# Patient Record
Sex: Female | Born: 1950 | Hispanic: No | Marital: Married | Smoking: Never smoker
Health system: Southern US, Community
[De-identification: ages and names within clinical notes are randomized; demographics above are authoritative.]

## PROBLEM LIST (undated history)

## (undated) DIAGNOSIS — R490 Dysphonia: Secondary | ICD-10-CM

## (undated) DIAGNOSIS — M1990 Unspecified osteoarthritis, unspecified site: Secondary | ICD-10-CM

## (undated) DIAGNOSIS — K219 Gastro-esophageal reflux disease without esophagitis: Secondary | ICD-10-CM

## (undated) DIAGNOSIS — J3089 Other allergic rhinitis: Secondary | ICD-10-CM

## (undated) DIAGNOSIS — J45909 Unspecified asthma, uncomplicated: Secondary | ICD-10-CM

## (undated) DIAGNOSIS — I447 Left bundle-branch block, unspecified: Secondary | ICD-10-CM

## (undated) DIAGNOSIS — Z8719 Personal history of other diseases of the digestive system: Secondary | ICD-10-CM

## (undated) DIAGNOSIS — F419 Anxiety disorder, unspecified: Secondary | ICD-10-CM

## (undated) DIAGNOSIS — Z9889 Other specified postprocedural states: Secondary | ICD-10-CM

## (undated) DIAGNOSIS — K3184 Gastroparesis: Secondary | ICD-10-CM

## (undated) DIAGNOSIS — Z87898 Personal history of other specified conditions: Secondary | ICD-10-CM

## (undated) DIAGNOSIS — K311 Adult hypertrophic pyloric stenosis: Secondary | ICD-10-CM

## (undated) DIAGNOSIS — I10 Essential (primary) hypertension: Secondary | ICD-10-CM

## (undated) DIAGNOSIS — R112 Nausea with vomiting, unspecified: Secondary | ICD-10-CM

## (undated) DIAGNOSIS — I209 Angina pectoris, unspecified: Secondary | ICD-10-CM

## (undated) HISTORY — DX: Adult hypertrophic pyloric stenosis: K31.1

## (undated) HISTORY — DX: Other allergic rhinitis: J30.89

## (undated) HISTORY — DX: Anxiety disorder, unspecified: F41.9

## (undated) HISTORY — DX: Dysphonia: R49.0

## (undated) HISTORY — PX: COLONOSCOPY: SHX174

## (undated) HISTORY — PX: CERVICAL DISC ARTHROPLASTY: SHX587

## (undated) HISTORY — DX: Left bundle-branch block, unspecified: I44.7

## (undated) HISTORY — DX: Unspecified asthma, uncomplicated: J45.909

## (undated) HISTORY — PX: ESOPHAGOGASTRODUODENOSCOPY: SHX1529

---

## 1981-04-17 HISTORY — PX: ABDOMINAL HYSTERECTOMY: SHX81

## 1981-04-17 HISTORY — PX: OTHER SURGICAL HISTORY: SHX169

## 2004-04-17 HISTORY — PX: OTHER SURGICAL HISTORY: SHX169

## 2009-04-17 DIAGNOSIS — I209 Angina pectoris, unspecified: Secondary | ICD-10-CM

## 2009-04-17 HISTORY — DX: Angina pectoris, unspecified: I20.9

## 2009-10-15 HISTORY — PX: CARDIAC CATHETERIZATION: SHX172

## 2009-11-08 ENCOUNTER — Inpatient Hospital Stay (HOSPITAL_COMMUNITY): Admission: EM | Admit: 2009-11-08 | Discharge: 2009-11-10 | Payer: Self-pay | Admitting: Emergency Medicine

## 2009-11-08 IMAGING — CR DG CHEST 2V
2 series · 2 of 2 positions shown · non-contrast
Comparison: None.

CLINICAL DATA: Chest pain and dyspnea.

CHEST - 2 VIEW

[w chest pa]
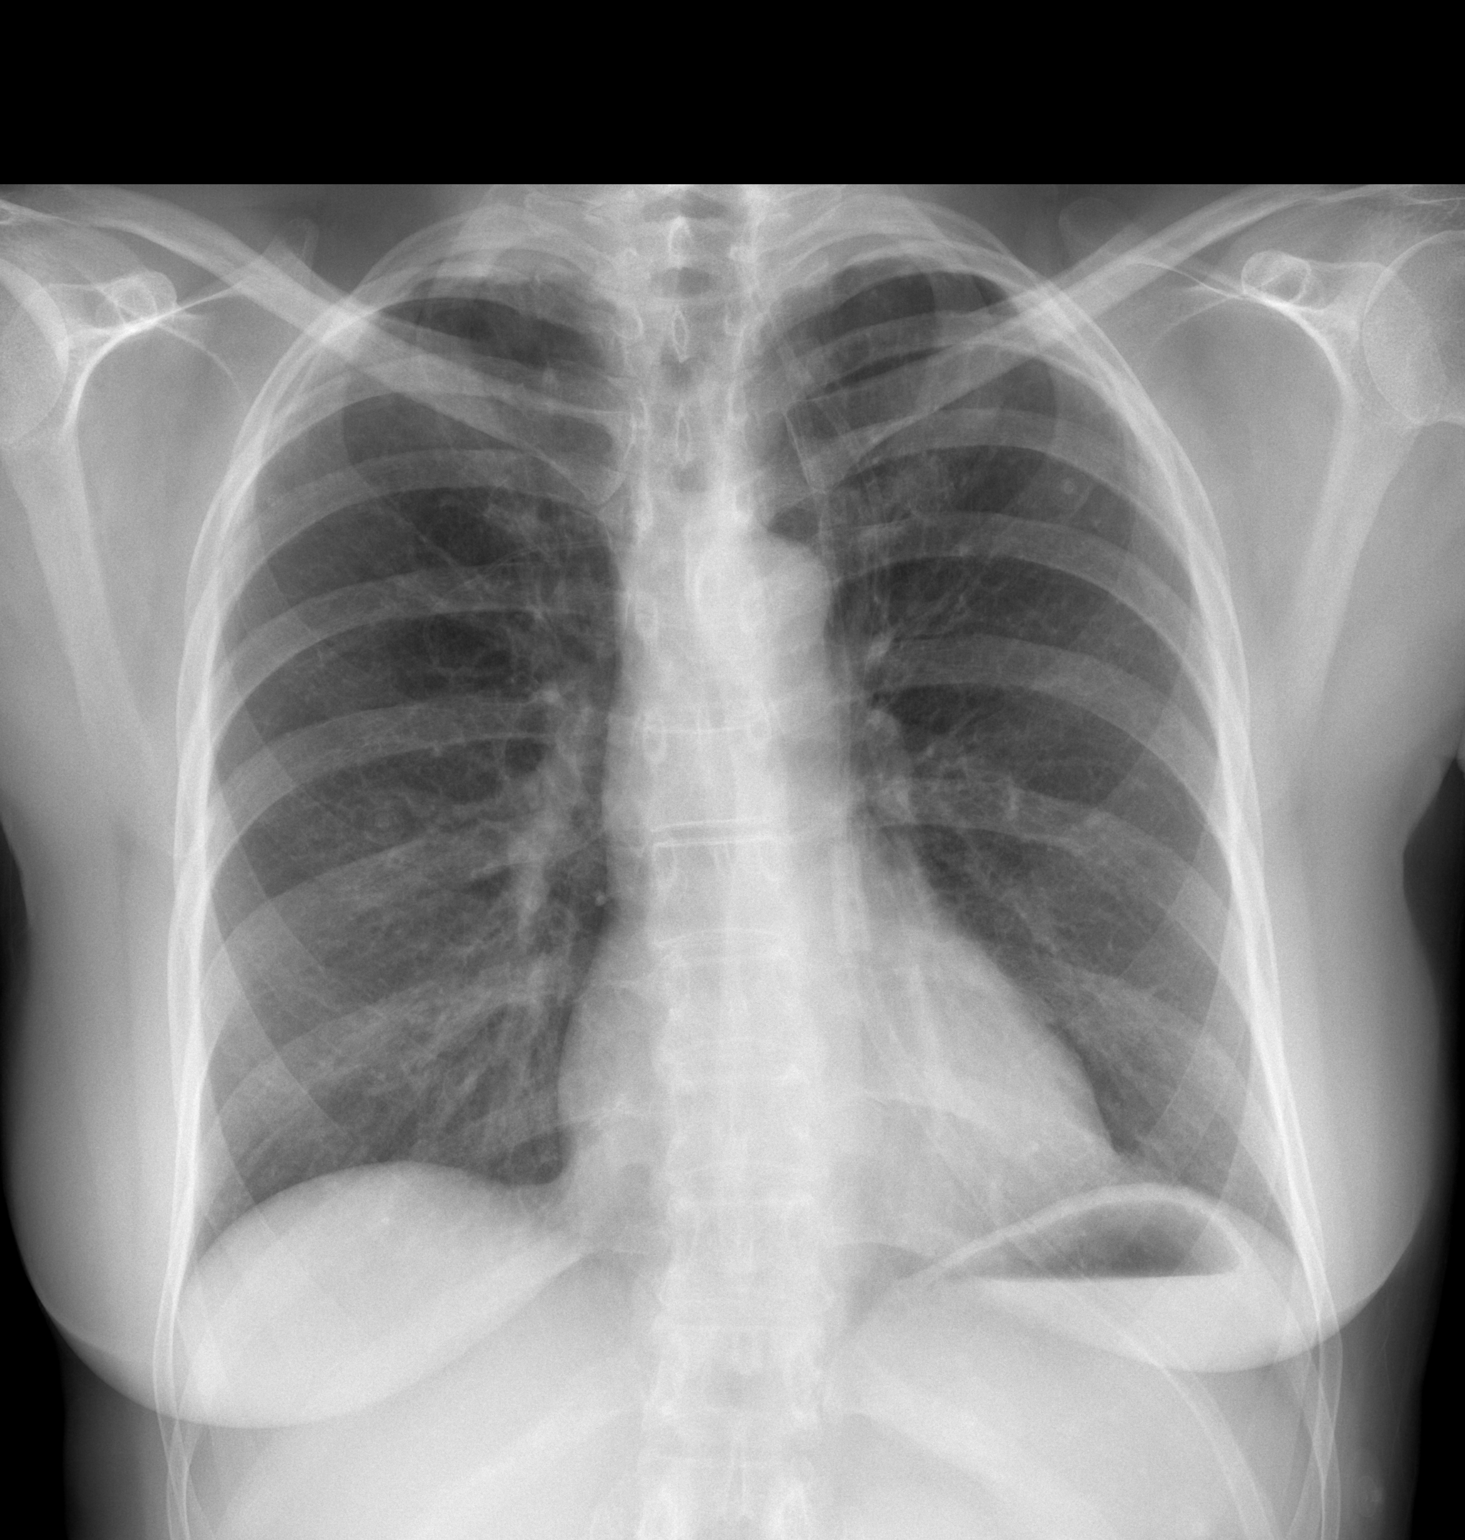

[w chest lat]
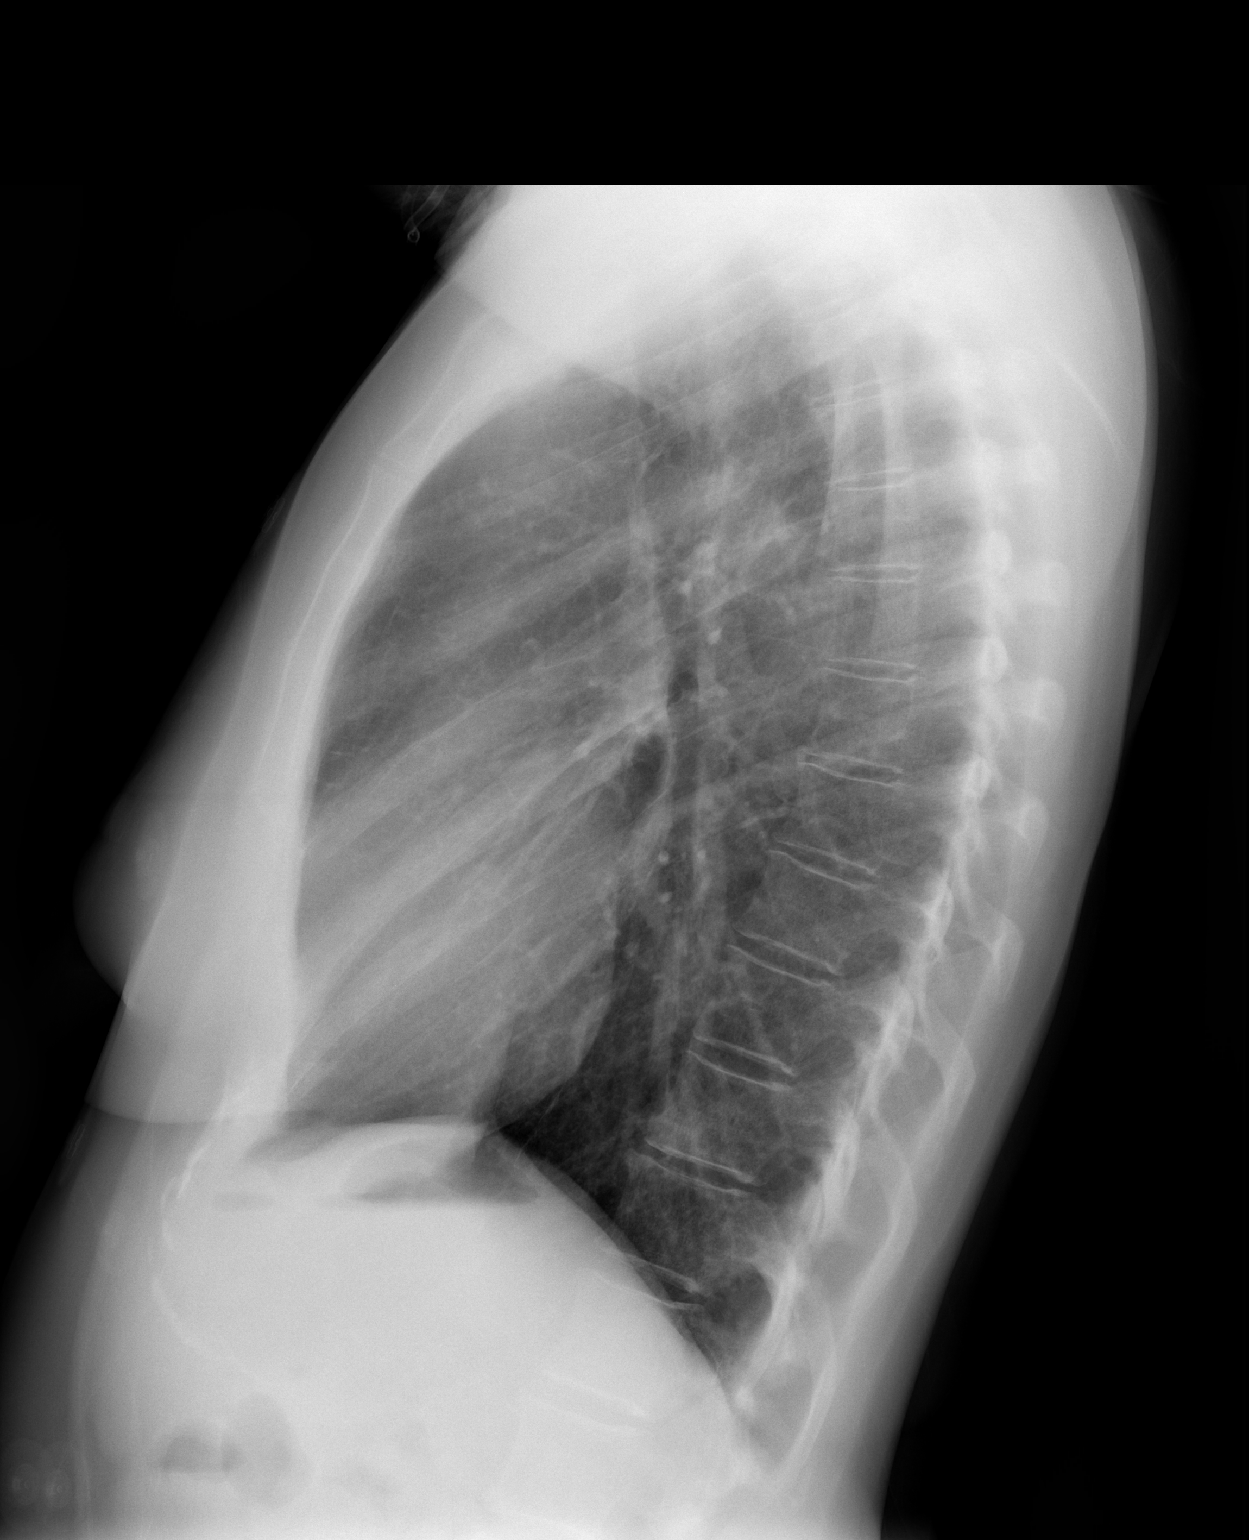

[2 of 2 positions shown; findings below may reference images not displayed]

FINDINGS: The lungs are clear without focal infiltrate, edema,
pneumothorax or pleural effusion. The cardiopericardial silhouette
is within normal limits for size. Imaged bony structures of the
thorax are intact. Telemetry pads project over each upper lobe.
IMPRESSION: No acute cardiopulmonary process.

## 2009-11-09 ENCOUNTER — Encounter (INDEPENDENT_AMBULATORY_CARE_PROVIDER_SITE_OTHER): Payer: Self-pay | Admitting: Interventional Cardiology

## 2010-07-02 LAB — BASIC METABOLIC PANEL
BUN: 5 mg/dL — ABNORMAL LOW (ref 6–23)
CO2: 28 mEq/L (ref 19–32)
Calcium: 8.7 mg/dL (ref 8.4–10.5)
Chloride: 105 mEq/L (ref 96–112)
Creatinine, Ser: 0.8 mg/dL (ref 0.4–1.2)
GFR calc Af Amer: >60 mL/min (ref >60)
GFR calc non Af Amer: >60 mL/min (ref >60)
Glucose, Bld: 99 mg/dL (ref 70–99)
Potassium: 5.1 mEq/L (ref 3.5–5.1)
Sodium: 137 mEq/L (ref 135–145)

## 2010-07-02 LAB — CK TOTAL AND CKMB (NOT AT ARMC)
CK, MB: 11 ng/mL (ref 0.3–4.0)
CK, MB: 37.9 ng/mL (ref 0.3–4.0)
CK, MB: 46.6 ng/mL (ref 0.3–4.0)
Relative Index: 3.6 — ABNORMAL HIGH (ref 0.0–2.5)
Relative Index: 3.8 — ABNORMAL HIGH (ref 0.0–2.5)
Relative Index: 4.2 — ABNORMAL HIGH (ref 0.0–2.5)
Total CK: 1109 U/L — ABNORMAL HIGH (ref 7–177)
Total CK: 305 U/L — ABNORMAL HIGH (ref 7–177)
Total CK: 995 U/L — ABNORMAL HIGH (ref 7–177)

## 2010-07-02 LAB — POCT I-STAT, CHEM 8
BUN: 6 mg/dL (ref 6–23)
Calcium, Ion: 1.06 mmol/L — ABNORMAL LOW (ref 1.12–1.32)
Chloride: 97 mEq/L (ref 96–112)
Creatinine, Ser: 0.8 mg/dL (ref 0.4–1.2)
Glucose, Bld: 102 mg/dL — ABNORMAL HIGH (ref 70–99)
HCT: 45 % (ref 36.0–46.0)
Hemoglobin: 15.3 g/dL — ABNORMAL HIGH (ref 12.0–15.0)
Potassium: 3.1 mEq/L — ABNORMAL LOW (ref 3.5–5.1)
Sodium: 131 mEq/L — ABNORMAL LOW (ref 135–145)
TCO2: 19 mmol/L (ref 0–100)

## 2010-07-02 LAB — CBC
HCT: 36.3 % (ref 36.0–46.0)
HCT: 42.5 % (ref 36.0–46.0)
Hemoglobin: 12.5 g/dL (ref 12.0–15.0)
Hemoglobin: 14.5 g/dL (ref 12.0–15.0)
MCH: 30.9 pg (ref 26.0–34.0)
MCH: 31.5 pg (ref 26.0–34.0)
MCHC: 33.3 g/dL (ref 30.0–36.0)
MCHC: 34.1 g/dL (ref 30.0–36.0)
MCV: 92.3 fL (ref 78.0–100.0)
MCV: 92.6 fL (ref 78.0–100.0)
Platelets: 227 10*3/uL (ref 150–400)
Platelets: 307 10*3/uL (ref 150–400)
RBC: 3.92 MIL/uL (ref 3.87–5.11)
RBC: 4.61 MIL/uL (ref 3.87–5.11)
RDW: 13.1 % (ref 11.5–15.5)
RDW: 13.5 % (ref 11.5–15.5)
WBC: 5.4 10*3/uL (ref 4.0–10.5)
WBC: 8.7 10*3/uL (ref 4.0–10.5)

## 2010-07-02 LAB — POCT CARDIAC MARKERS
CKMB, poc: 1.7 ng/mL (ref 1.0–8.0)
CKMB, poc: 6.8 ng/mL (ref 1.0–8.0)
Myoglobin, poc: 183 ng/mL (ref 12–200)
Myoglobin, poc: >500 ng/mL (ref 12–200)
Troponin i, poc: <0.05 ng/mL (ref 0.00–0.09)
Troponin i, poc: <0.05 ng/mL (ref 0.00–0.09)

## 2010-07-02 LAB — URINALYSIS, ROUTINE W REFLEX MICROSCOPIC
Bilirubin Urine: NEGATIVE
Glucose, UA: NEGATIVE mg/dL
Hgb urine dipstick: NEGATIVE
Ketones, ur: 15 mg/dL — AB
Nitrite: NEGATIVE
Protein, ur: NEGATIVE mg/dL
Specific Gravity, Urine: 1.011 (ref 1.005–1.030)
Urobilinogen, UA: 0.2 mg/dL (ref 0.0–1.0)
pH: 8.5 — ABNORMAL HIGH (ref 5.0–8.0)

## 2010-07-02 LAB — DIFFERENTIAL
Basophils Absolute: 0.1 10*3/uL (ref 0.0–0.1)
Basophils Relative: 1 % (ref 0–1)
Eosinophils Absolute: 0.1 10*3/uL (ref 0.0–0.7)
Eosinophils Relative: 1 % (ref 0–5)
Lymphocytes Relative: 31 % (ref 12–46)
Lymphs Abs: 2.7 10*3/uL (ref 0.7–4.0)
Monocytes Absolute: 0.5 10*3/uL (ref 0.1–1.0)
Monocytes Relative: 6 % (ref 3–12)
Neutro Abs: 5.3 10*3/uL (ref 1.7–7.7)
Neutrophils Relative %: 61 % (ref 43–77)

## 2010-07-02 LAB — CARDIAC PANEL(CRET KIN+CKTOT+MB+TROPI)
CK, MB: 15 ng/mL (ref 0.3–4.0)
CK, MB: 18.6 ng/mL (ref 0.3–4.0)
Relative Index: 2.5 (ref 0.0–2.5)
Relative Index: 2.7 — ABNORMAL HIGH (ref 0.0–2.5)
Total CK: 595 U/L — ABNORMAL HIGH (ref 7–177)
Total CK: 685 U/L — ABNORMAL HIGH (ref 7–177)
Troponin I: 0.01 ng/mL (ref 0.00–0.06)
Troponin I: 0.01 ng/mL (ref 0.00–0.06)

## 2010-07-02 LAB — URINE CULTURE: Colony Count: 80000

## 2010-07-02 LAB — TROPONIN I
Troponin I: 0.01 ng/mL (ref 0.00–0.06)
Troponin I: 0.02 ng/mL (ref 0.00–0.06)
Troponin I: 0.04 ng/mL (ref 0.00–0.06)

## 2010-07-02 LAB — D-DIMER, QUANTITATIVE: D-Dimer, Quant: 0.29 ug/mL-FEU (ref 0.00–0.48)

## 2010-07-02 LAB — PROTIME-INR
INR: 0.94 (ref 0.00–1.49)
Prothrombin Time: 12.5 seconds (ref 11.6–15.2)

## 2010-07-02 LAB — LIPID PANEL
Cholesterol: 216 mg/dL — ABNORMAL HIGH (ref 0–200)
HDL: 69 mg/dL (ref >39)
LDL Cholesterol: 133 mg/dL — ABNORMAL HIGH (ref 0–99)
Total CHOL/HDL Ratio: 3.1 RATIO
Triglycerides: 71 mg/dL (ref <150)
VLDL: 14 mg/dL (ref 0–40)

## 2010-07-02 LAB — HEMOGLOBIN A1C
Hgb A1c MFr Bld: 5.9 % — ABNORMAL HIGH (ref <5.7)
Mean Plasma Glucose: 123 mg/dL — ABNORMAL HIGH (ref <117)

## 2010-07-02 LAB — APTT: aPTT: 27 seconds (ref 24–37)

## 2011-12-12 ENCOUNTER — Other Ambulatory Visit (HOSPITAL_COMMUNITY): Payer: Self-pay | Admitting: Gastroenterology

## 2011-12-12 DIAGNOSIS — K3184 Gastroparesis: Secondary | ICD-10-CM

## 2011-12-21 ENCOUNTER — Encounter (HOSPITAL_COMMUNITY)
Admission: RE | Admit: 2011-12-21 | Discharge: 2011-12-21 | Disposition: A | Discharge status: Home / Self Care | Payer: Managed Care, Other (non HMO) | Source: Ambulatory Visit | Attending: Gastroenterology | Admitting: Gastroenterology

## 2011-12-21 DIAGNOSIS — R1013 Epigastric pain: Secondary | ICD-10-CM | POA: Insufficient documentation

## 2011-12-21 DIAGNOSIS — K3184 Gastroparesis: Secondary | ICD-10-CM

## 2011-12-21 DIAGNOSIS — K3189 Other diseases of stomach and duodenum: Secondary | ICD-10-CM | POA: Insufficient documentation

## 2011-12-21 MED ORDER — TECHNETIUM TC 99M SULFUR COLLOID
2.2000 | Freq: Once | INTRAVENOUS | Status: AC | PRN
  Administered 2011-12-21: 2.2 via ORAL

## 2013-04-14 ENCOUNTER — Emergency Department (HOSPITAL_COMMUNITY): Payer: Managed Care, Other (non HMO)

## 2013-04-14 ENCOUNTER — Emergency Department (HOSPITAL_COMMUNITY)
Admission: EM | Admit: 2013-04-14 | Discharge: 2013-04-14 | Disposition: A | Discharge status: Home / Self Care | Payer: Managed Care, Other (non HMO) | Attending: Emergency Medicine | Admitting: Emergency Medicine

## 2013-04-14 ENCOUNTER — Encounter (HOSPITAL_COMMUNITY): Payer: Self-pay | Admitting: Emergency Medicine

## 2013-04-14 DIAGNOSIS — R220 Localized swelling, mass and lump, head: Secondary | ICD-10-CM | POA: Insufficient documentation

## 2013-04-14 DIAGNOSIS — R209 Unspecified disturbances of skin sensation: Secondary | ICD-10-CM | POA: Insufficient documentation

## 2013-04-14 DIAGNOSIS — R55 Syncope and collapse: Secondary | ICD-10-CM | POA: Insufficient documentation

## 2013-04-14 DIAGNOSIS — R42 Dizziness and giddiness: Secondary | ICD-10-CM | POA: Insufficient documentation

## 2013-04-14 DIAGNOSIS — R112 Nausea with vomiting, unspecified: Secondary | ICD-10-CM | POA: Insufficient documentation

## 2013-04-14 DIAGNOSIS — I10 Essential (primary) hypertension: Secondary | ICD-10-CM | POA: Insufficient documentation

## 2013-04-14 DIAGNOSIS — R110 Nausea: Secondary | ICD-10-CM | POA: Insufficient documentation

## 2013-04-14 DIAGNOSIS — R51 Headache: Secondary | ICD-10-CM | POA: Insufficient documentation

## 2013-04-14 DIAGNOSIS — Z8719 Personal history of other diseases of the digestive system: Secondary | ICD-10-CM | POA: Insufficient documentation

## 2013-04-14 DIAGNOSIS — R5383 Other fatigue: Secondary | ICD-10-CM | POA: Insufficient documentation

## 2013-04-14 DIAGNOSIS — R5381 Other malaise: Secondary | ICD-10-CM | POA: Insufficient documentation

## 2013-04-14 HISTORY — DX: Gastroparesis: K31.84

## 2013-04-14 HISTORY — DX: Essential (primary) hypertension: I10

## 2013-04-14 LAB — URINALYSIS, ROUTINE W REFLEX MICROSCOPIC
Bilirubin Urine: NEGATIVE
Glucose, UA: NEGATIVE mg/dL
Hgb urine dipstick: NEGATIVE
Ketones, ur: NEGATIVE mg/dL
Nitrite: NEGATIVE
Protein, ur: NEGATIVE mg/dL
Specific Gravity, Urine: 1.022 (ref 1.005–1.030)
Urobilinogen, UA: 0.2 mg/dL (ref 0.0–1.0)
pH: 7.5 (ref 5.0–8.0)

## 2013-04-14 LAB — CBC WITH DIFFERENTIAL/PLATELET
Basophils Absolute: 0 10*3/uL (ref 0.0–0.1)
Basophils Relative: 0 % (ref 0–1)
Eosinophils Absolute: 0 10*3/uL (ref 0.0–0.7)
Eosinophils Relative: 0 % (ref 0–5)
HCT: 39.9 % (ref 36.0–46.0)
Hemoglobin: 13.8 g/dL (ref 12.0–15.0)
Lymphocytes Relative: 7 % — ABNORMAL LOW (ref 12–46)
Lymphs Abs: 0.7 10*3/uL (ref 0.7–4.0)
MCH: 29.7 pg (ref 26.0–34.0)
MCHC: 34.6 g/dL (ref 30.0–36.0)
MCV: 86 fL (ref 78.0–100.0)
Monocytes Absolute: 0.4 10*3/uL (ref 0.1–1.0)
Monocytes Relative: 4 % (ref 3–12)
Neutro Abs: 8.8 10*3/uL — ABNORMAL HIGH (ref 1.7–7.7)
Neutrophils Relative %: 88 % — ABNORMAL HIGH (ref 43–77)
Platelets: 248 10*3/uL (ref 150–400)
RBC: 4.64 MIL/uL (ref 3.87–5.11)
RDW: 13.7 % (ref 11.5–15.5)
WBC: 10 10*3/uL (ref 4.0–10.5)

## 2013-04-14 LAB — BASIC METABOLIC PANEL
BUN: 12 mg/dL (ref 6–23)
CO2: 27 mEq/L (ref 19–32)
Calcium: 9.2 mg/dL (ref 8.4–10.5)
Chloride: 98 mEq/L (ref 96–112)
Creatinine, Ser: 0.75 mg/dL (ref 0.50–1.10)
GFR calc Af Amer: >90 mL/min (ref >90)
GFR calc non Af Amer: 88 mL/min — ABNORMAL LOW (ref >90)
Glucose, Bld: 105 mg/dL — ABNORMAL HIGH (ref 70–99)
Potassium: 4.1 mEq/L (ref 3.5–5.1)
Sodium: 134 mEq/L — ABNORMAL LOW (ref 135–145)

## 2013-04-14 LAB — URINE MICROSCOPIC-ADD ON

## 2013-04-14 LAB — TROPONIN I: Troponin I: <0.3 ng/mL (ref <0.30)

## 2013-04-14 IMAGING — CT CT HEAD W/O CM
2 series · 16 of 30 positions shown, 18 images · non-contrast
Comparison: None

CLINICAL DATA: Syncopal episode while seated. Subsequent nausea and
vomiting. Headache. Stroke risk factors include hypertension.

EXAM:
CT HEAD WITHOUT CONTRAST
TECHNIQUE: Contiguous axial images were obtained from the base of the skull
through the vertex without contrast.

[Series 2: head w/o · axial · non-contrast · 0.49mm/px · z∈[+102,+232]mm · 8 of 34 slices shown, 10 images]
[im 4/34  brain]
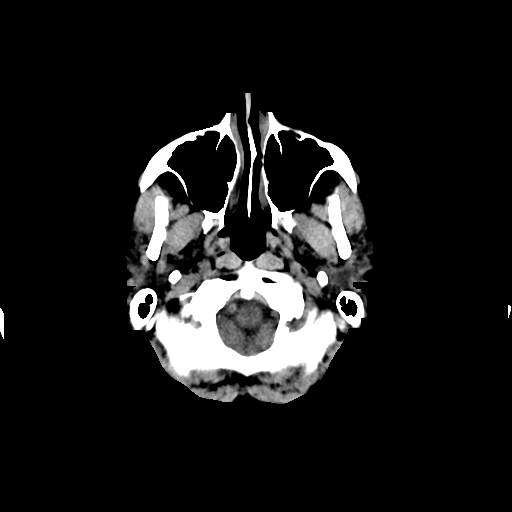
[im 4/34  bone]
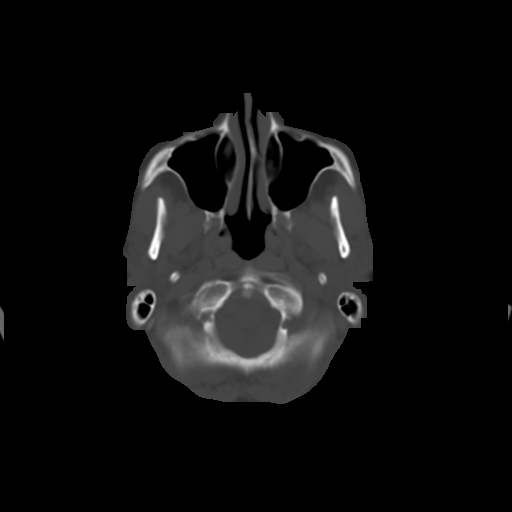
[im 8/34  brain]
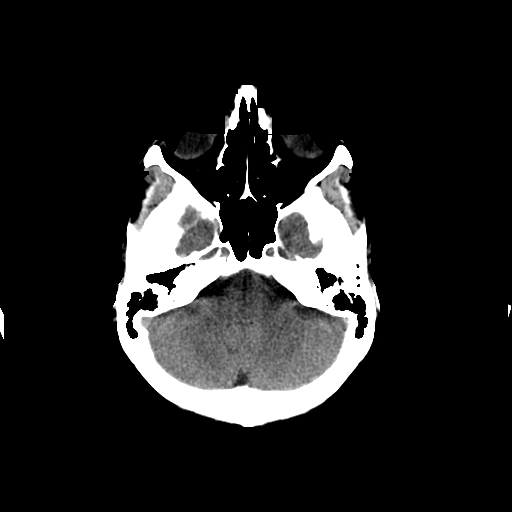
[im 12/34  brain]
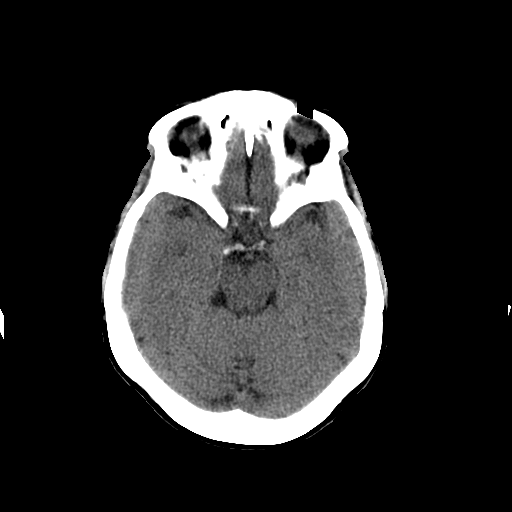
[im 15/34  brain]
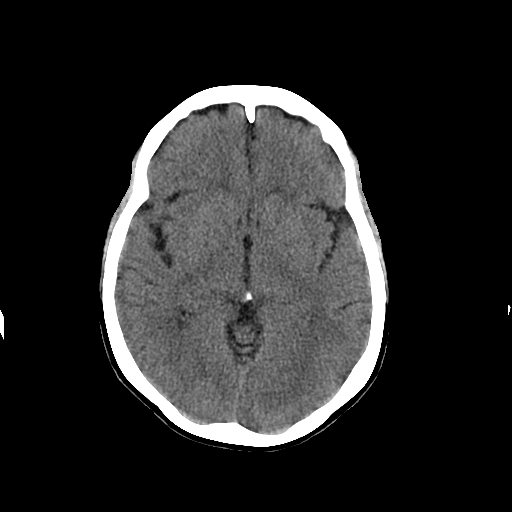
[im 19/34  brain]
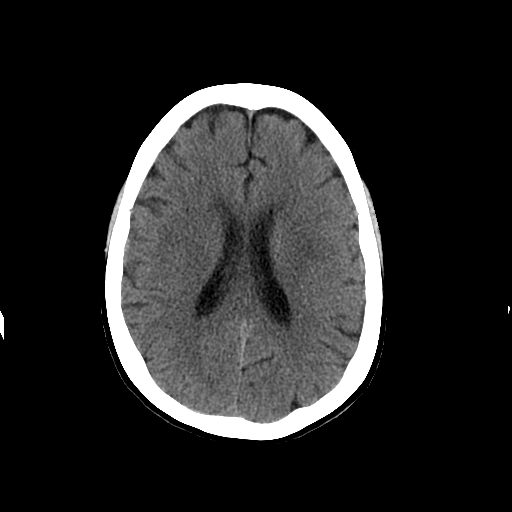
[im 19/34  bone]
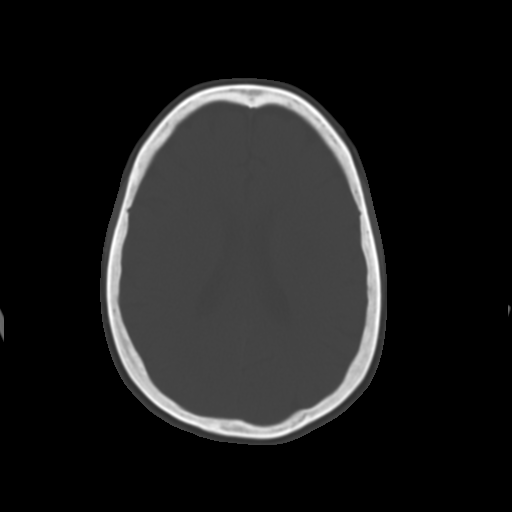
[im 23/34  brain]
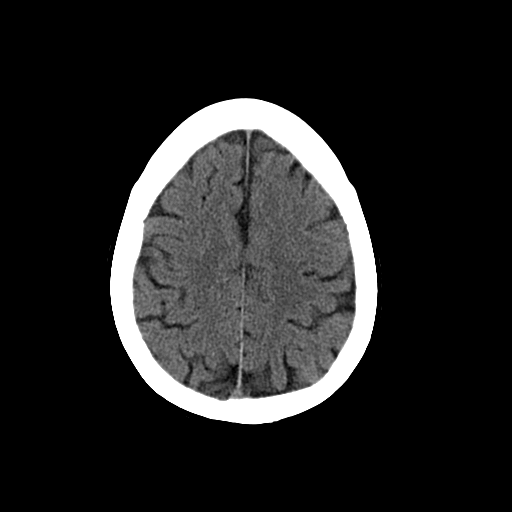
[im 26/34  brain]
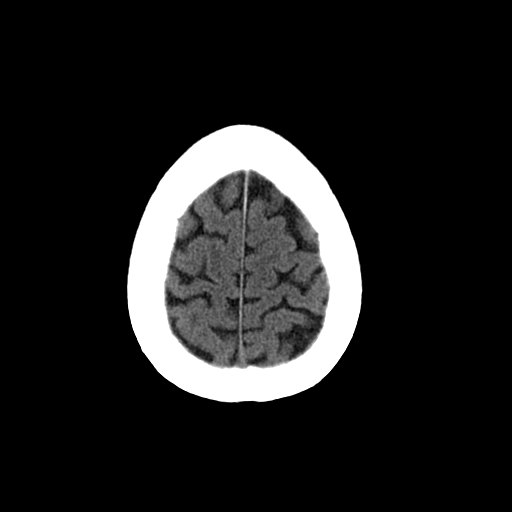
[im 30/34  brain]
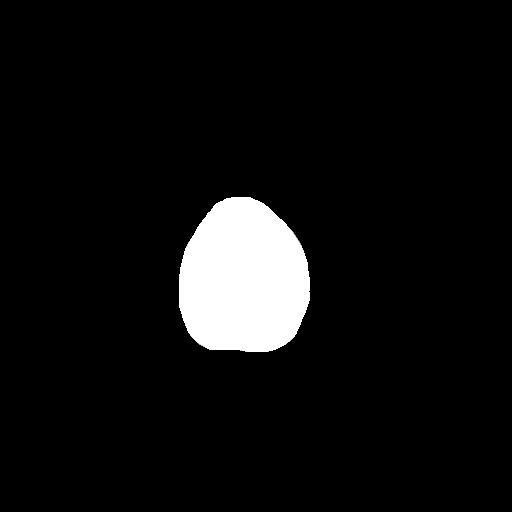

[Series 3: head w/o bone · axial · non-contrast · 0.49mm/px · z∈[+102,+234]mm · 8 of 67 slices shown]
[im 7/67  bone]
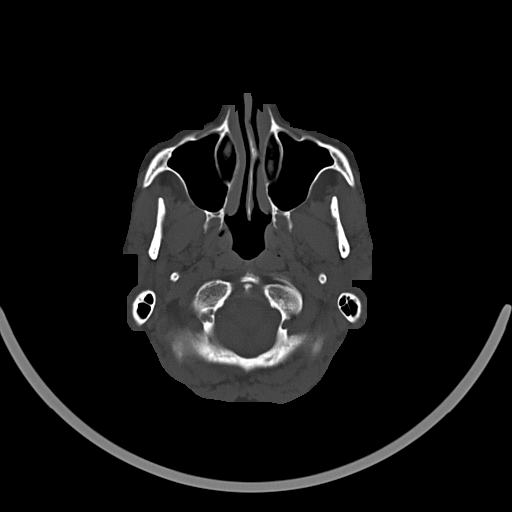
[im 14/67  bone]
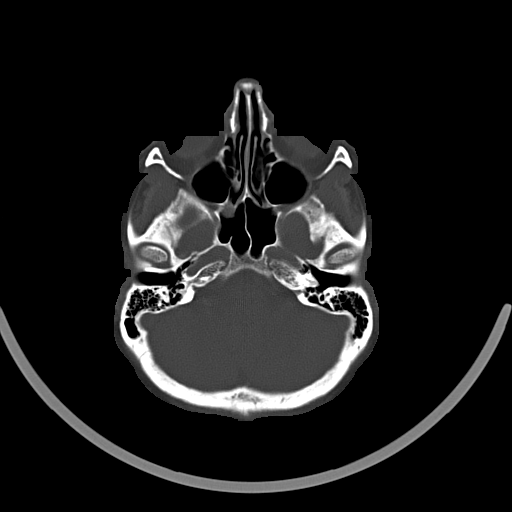
[im 21/67  bone]
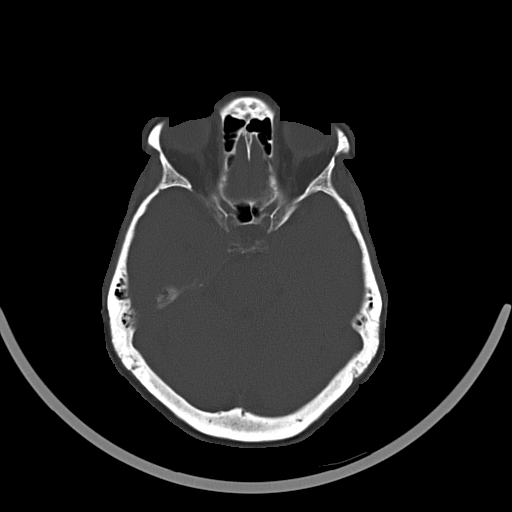
[im 28/67  bone]
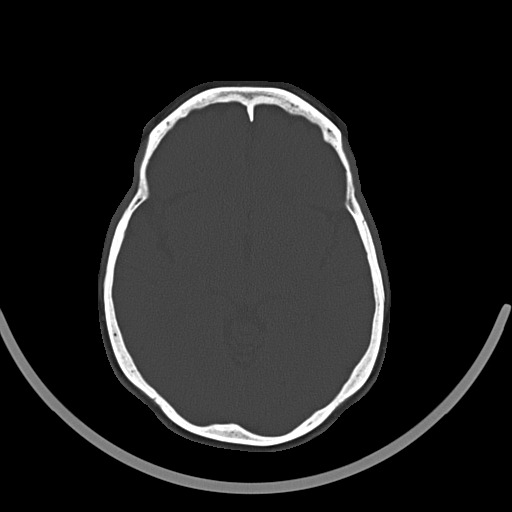
[im 39/67  bone]
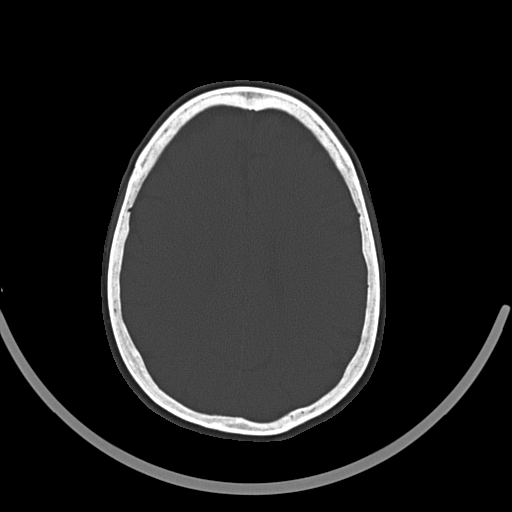
[im 46/67  bone]
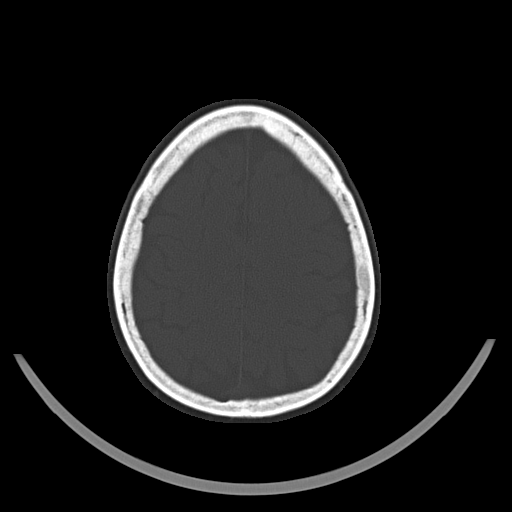
[im 53/67  bone]
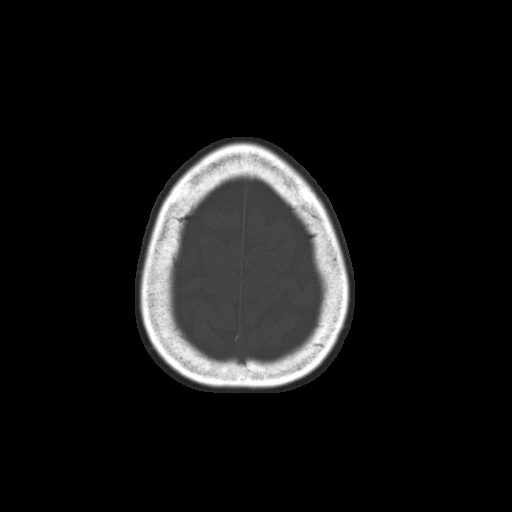
[im 60/67  bone]
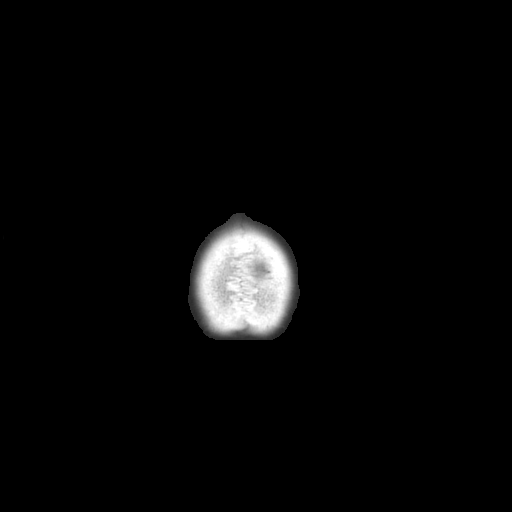

[16 of 30 positions shown; findings below may reference images not displayed]

FINDINGS: No evidence for acute infarction, hemorrhage, mass lesion,
hydrocephalus, or extra-axial fluid. Slight premature for age
cerebral atrophy. Hypoattenuation of the supratentorial
periventricular and subcortical white matter, likely chronic
microvascular ischemic change. Calvarium intact. No acute sinus or
mastoid disease. Extracranial soft tissues grossly unremarkable.
IMPRESSION: Slight premature for age cerebral atrophy and chronic microvascular
ischemic change. No visible acute intracranial findings.

## 2013-04-14 MED ORDER — SODIUM CHLORIDE 0.9 % IV BOLUS (SEPSIS)
1000.0000 mL | Freq: Once | INTRAVENOUS | Status: AC
  Administered 2013-04-14: 1000 mL via INTRAVENOUS

## 2013-04-14 NOTE — ED Provider Notes (Signed)
CSN: [id number]     Arrival date & time 04/14/13  1113 History   First MD Initiated Contact with Patient 04/14/13 1134     Chief Complaint  Patient presents with  . Near Syncope    everything got black, no LOC  . Nausea   (Consider location/radiation/quality/duration/timing/severity/associated sxs/prior Treatment) Patient is a 62 y.o. female presenting with near-syncope.  Near Syncope   Pt with history of gastroparesis reports she was sitting at her desk at work earlier today and began to feel lightheaded. She felt everything go black, but states she was not unconscious. She felt very weak, then began to feel nauseated. She vomited several times and felt jittery afterward. She is feeling better now, but states she began to have tingling in her L 3-5th fingers. She has had similar episodes before, but the tingling was new. She has had low K in the past, but currently taking supplements. She is complaining only of headache now. Denies having had any CP, SOB or tachypalpitations. She reports she feels 'mild swelling' on the L cheek, no injury.   Past Medical History  Diagnosis Date  . Hypertension   . Gastroparesis    History reviewed. No pertinent past surgical history. No family history on file. History  Substance Use Topics  . Smoking status: Not on file  . Smokeless tobacco: Not on file  . Alcohol Use: No   OB History   Grav Para Term Preterm Abortions TAB SAB Ect Mult Living                 Review of Systems  Cardiovascular: Positive for near-syncope.   All other systems reviewed and are negative except as noted in HPI.   Allergies  Review of patient's allergies indicates no known allergies.  Home Medications  No current outpatient prescriptions on file. BP 159/90  Pulse 89  Temp(Src) 97.7 F (36.5 C) (Oral)  Resp 18  Wt 130 lb (58.968 kg)  SpO2 100% Physical Exam  Nursing note and vitals reviewed. Constitutional: She is oriented to person, place, and time. She  appears well-developed and well-nourished.  HENT:  Head: Normocephalic and atraumatic.  No facial swelling  Eyes: EOM are normal. Pupils are equal, round, and reactive to light.  Neck: Normal range of motion. Neck supple.  Cardiovascular: Normal rate, normal heart sounds and intact distal pulses.   Pulmonary/Chest: Effort normal and breath sounds normal.  Abdominal: Bowel sounds are normal. She exhibits no distension. There is no tenderness.  Musculoskeletal: Normal range of motion. She exhibits no edema and no tenderness.  Neurological: She is alert and oriented to person, place, and time. She has normal strength and normal reflexes. No cranial nerve deficit or sensory deficit. Coordination normal.  No facial asymmetry, normal exam  Skin: Skin is warm and dry. No rash noted.  Psychiatric: She has a normal mood and affect.    ED Course  Procedures (including critical care time) Labs Review Labs Reviewed  CBC WITH DIFFERENTIAL - Abnormal; Notable for the following:    Neutrophils Relative % 88 (*)    Neutro Abs 8.8 (*)    Lymphocytes Relative 7 (*)    All other components within normal limits  BASIC METABOLIC PANEL - Abnormal; Notable for the following:    Sodium 134 (*)    Glucose, Bld 105 (*)    GFR calc non Af Amer 88 (*)    All other components within normal limits  URINALYSIS, ROUTINE W REFLEX MICROSCOPIC - Abnormal;  Notable for the following:    Leukocytes, UA SMALL (*)    All other components within normal limits  URINE MICROSCOPIC-ADD ON - Abnormal; Notable for the following:    Squamous Epithelial / LPF FEW (*)    All other components within normal limits  TROPONIN I   Imaging Review Ct Head Wo Contrast  04/14/2013   CLINICAL DATA:  Syncopal episode while seated. Subsequent nausea and vomiting. Headache. Stroke risk factors include hypertension.  EXAM: CT HEAD WITHOUT CONTRAST  TECHNIQUE: Contiguous axial images were obtained from the base of the skull through the  vertex without contrast.  COMPARISON:  None  FINDINGS: No evidence for acute infarction, hemorrhage, mass lesion, hydrocephalus, or extra-axial fluid. Slight premature for age cerebral atrophy. Hypoattenuation of the supratentorial periventricular and subcortical white matter, likely chronic microvascular ischemic change. Calvarium intact. No acute sinus or mastoid disease. Extracranial soft tissues grossly unremarkable.  IMPRESSION: Slight premature for age cerebral atrophy and chronic microvascular ischemic change. No visible acute intracranial findings.   Electronically Signed   By: Davonna Belling M.D.   On: 04/14/2013 12:55    EKG Interpretation    Date/Time:  Monday April 14 2013 11:22:29 EST Ventricular Rate:  89 PR Interval:  132 QRS Duration: 116 QT Interval:  396 QTC Calculation: 481 R Axis:   -30 Text Interpretation:  Normal sinus rhythm Left axis deviation Septal infarct , age undetermined ST \\T \ T wave abnormality, consider lateral ischemia Abnormal ECG No significant change since last tracing Confirmed by SHELDON  MD, CHARLES ([id number]) on 04/14/2013 11:37:43 AM            MDM   1. Vasovagal syncope     Labs and imaging reviewed and unremarkable. No concern for acute infarct or TIA. Suspect vasovagal syncope related to vomiting/gastric distress. Pt back to baseline and ready to go home. Tolerating PO, ambulating without dizziness.    Charles B. Bernette Mayers, MD 04/14/13 1350

## 2013-04-14 NOTE — ED Notes (Signed)
Pt. Stated, i was sitting at my desk and all of a sudden everything got black and then felt nausea and vomited 2 times. Stayed very shakey not tingling but a numbness and a headache from all the stuff going on.  Went to Dr's office and they sent me to ED

## 2013-04-14 NOTE — ED Notes (Signed)
Patient ambulated well down hallway and back 

## 2013-04-14 NOTE — ED Notes (Signed)
Pt. Stated, I've notice a little bit of swelling on the left side of my face since the episode

## 2013-08-23 DIAGNOSIS — K5900 Constipation, unspecified: Secondary | ICD-10-CM | POA: Insufficient documentation

## 2013-08-23 DIAGNOSIS — R110 Nausea: Secondary | ICD-10-CM | POA: Insufficient documentation

## 2014-06-05 ENCOUNTER — Other Ambulatory Visit (HOSPITAL_COMMUNITY)
Admission: RE | Admit: 2014-06-05 | Discharge: 2014-06-05 | Disposition: A | Discharge status: Home / Self Care | Payer: Managed Care, Other (non HMO) | Source: Ambulatory Visit | Attending: Internal Medicine | Admitting: Internal Medicine

## 2014-06-05 ENCOUNTER — Other Ambulatory Visit: Payer: Self-pay | Admitting: Internal Medicine

## 2014-06-05 DIAGNOSIS — Z01419 Encounter for gynecological examination (general) (routine) without abnormal findings: Secondary | ICD-10-CM | POA: Diagnosis not present

## 2014-06-05 DIAGNOSIS — Z1151 Encounter for screening for human papillomavirus (HPV): Secondary | ICD-10-CM | POA: Insufficient documentation

## 2014-06-10 LAB — CYTOLOGY - PAP

## 2014-07-30 ENCOUNTER — Other Ambulatory Visit: Payer: Self-pay

## 2014-07-30 DIAGNOSIS — Z1231 Encounter for screening mammogram for malignant neoplasm of breast: Secondary | ICD-10-CM

## 2014-10-07 DIAGNOSIS — R002 Palpitations: Secondary | ICD-10-CM | POA: Insufficient documentation

## 2014-11-16 ENCOUNTER — Ambulatory Visit: Payer: Managed Care, Other (non HMO) | Admitting: Internal Medicine

## 2015-02-11 ENCOUNTER — Other Ambulatory Visit: Payer: Self-pay | Admitting: Internal Medicine

## 2015-02-11 ENCOUNTER — Ambulatory Visit
Admission: RE | Admit: 2015-02-11 | Discharge: 2015-02-11 | Disposition: A | Discharge status: Home / Self Care | Payer: PPO | Source: Ambulatory Visit | Attending: Internal Medicine | Admitting: Internal Medicine

## 2015-02-11 DIAGNOSIS — M25551 Pain in right hip: Secondary | ICD-10-CM

## 2015-02-11 IMAGING — CR DG HIP (WITH OR WITHOUT PELVIS) 2-3V*R*
2 series · 2 of 2 positions shown · non-contrast
Comparison: None in PACs

CLINICAL DATA: Chronic atraumatic right hip pain

EXAM:
DG HIP (WITH OR WITHOUT PELVIS) 2-3V RIGHT

[w pelvis]
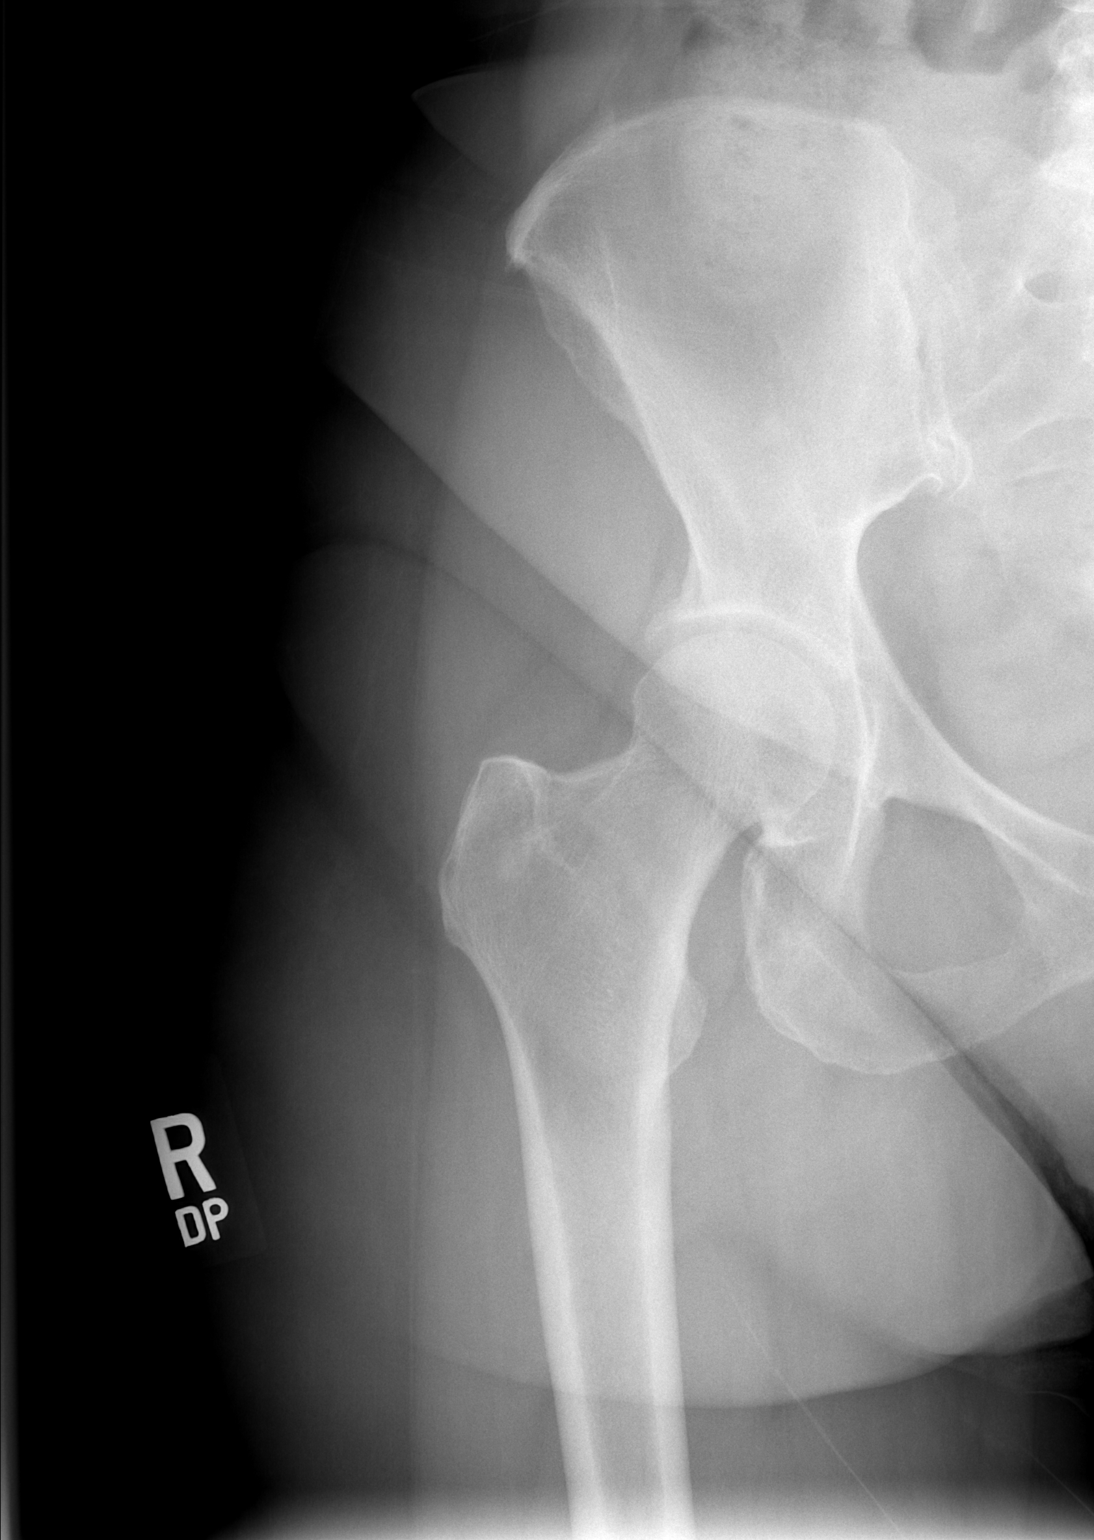

[t hip frog leg right]
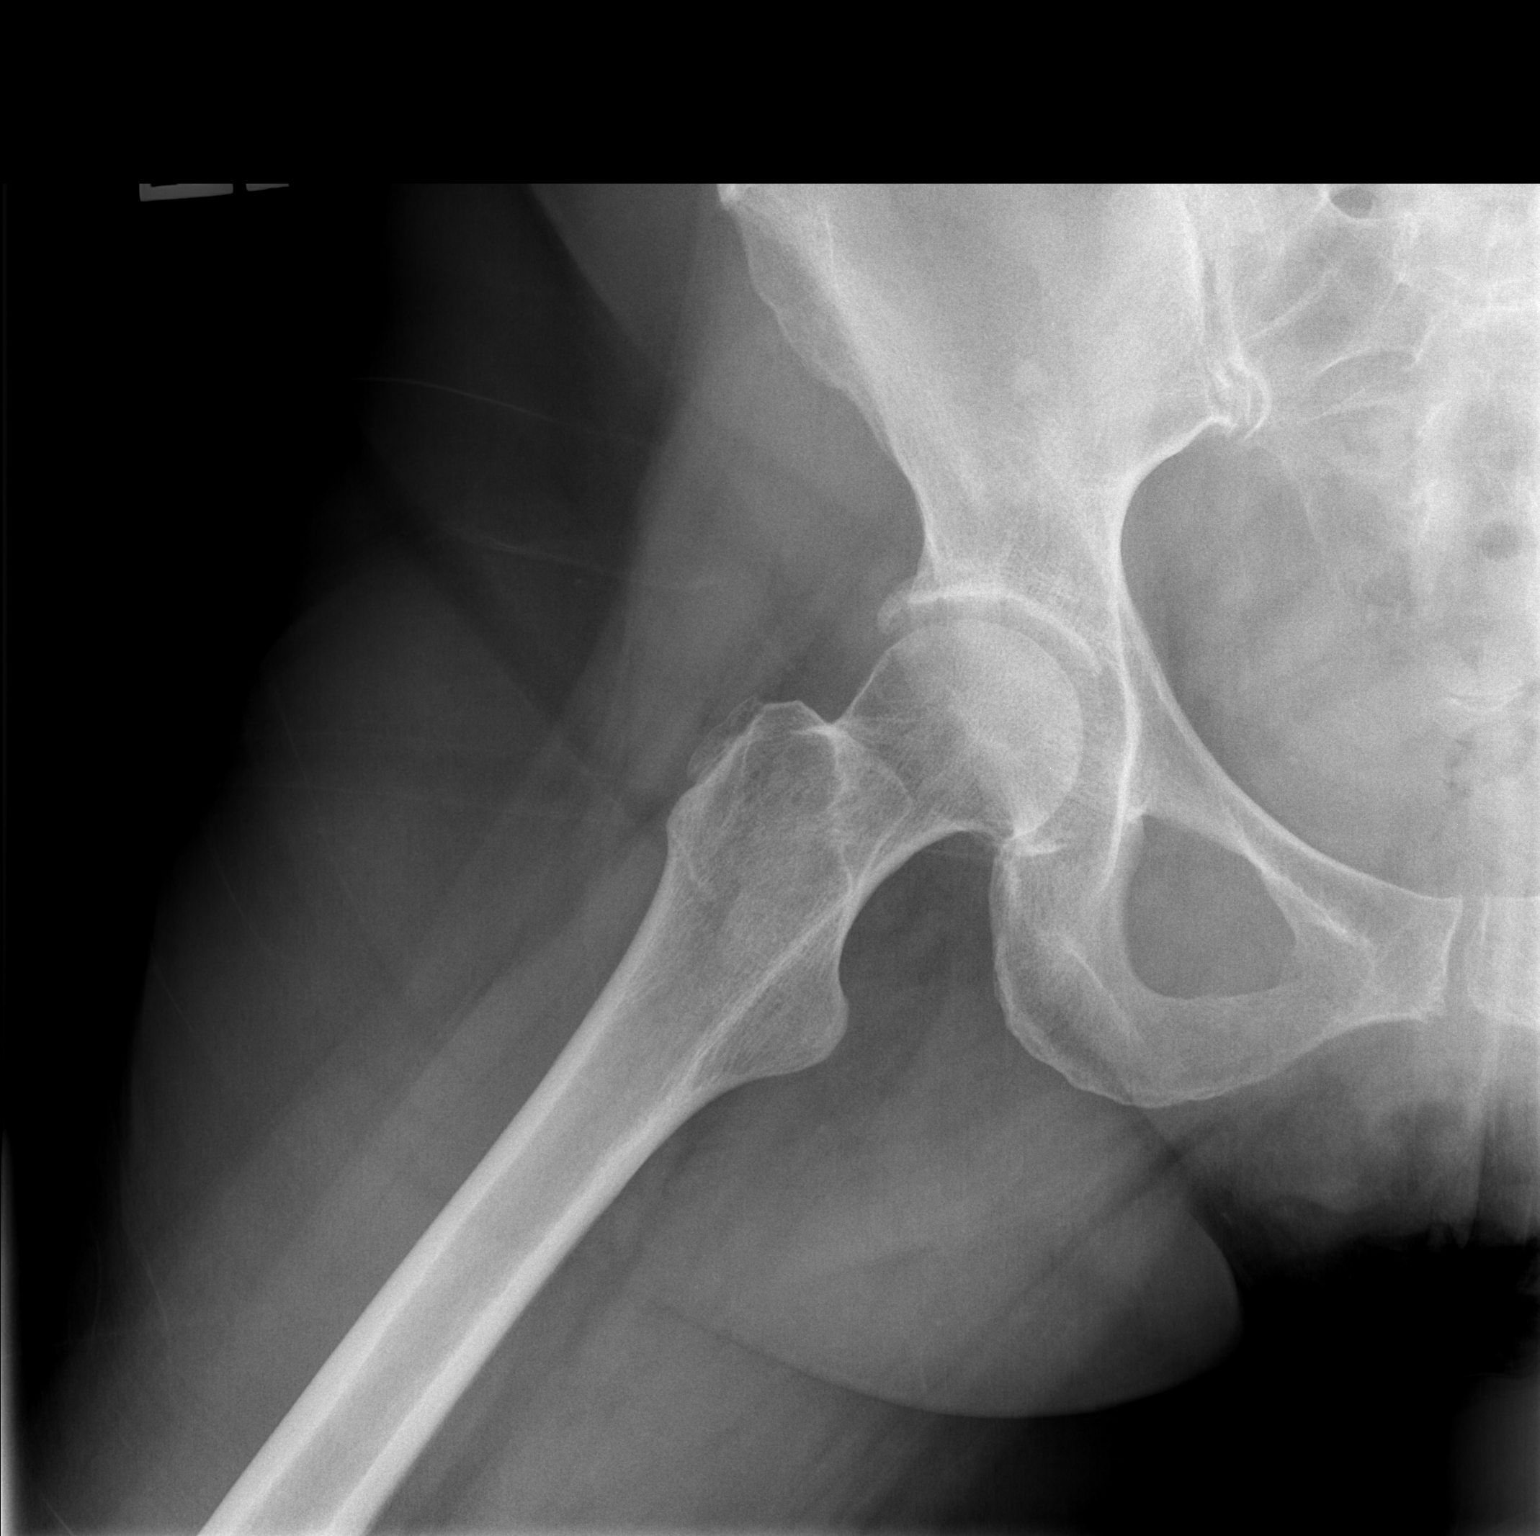

[2 of 2 positions shown; findings below may reference images not displayed]

FINDINGS: The right hemipelvis appears adequately mineralized. The right hip
joint space is preserved. The articular surfaces of the femoral head
and acetabulum remain smoothly rounded. The femoral neck and
intertrochanteric regions are normal. The surrounding soft tissues
are unremarkable.
IMPRESSION: There is no acute or significant chronic bony abnormality of the
right hip.

## 2015-03-25 ENCOUNTER — Ambulatory Visit: Payer: Managed Care, Other (non HMO) | Admitting: Family Medicine

## 2015-04-09 ENCOUNTER — Ambulatory Visit (HOSPITAL_COMMUNITY)
Admission: RE | Admit: 2015-04-09 | Discharge: 2015-04-09 | Disposition: A | Discharge status: Home / Self Care | Payer: Medicare Other | Source: Ambulatory Visit | Attending: Orthopedic Surgery | Admitting: Orthopedic Surgery

## 2015-04-09 ENCOUNTER — Other Ambulatory Visit (HOSPITAL_COMMUNITY): Payer: Self-pay | Admitting: Orthopedic Surgery

## 2015-04-09 DIAGNOSIS — M5137 Other intervertebral disc degeneration, lumbosacral region: Secondary | ICD-10-CM | POA: Diagnosis not present

## 2015-04-09 DIAGNOSIS — M160 Bilateral primary osteoarthritis of hip: Secondary | ICD-10-CM | POA: Diagnosis not present

## 2015-04-09 DIAGNOSIS — M47896 Other spondylosis, lumbar region: Secondary | ICD-10-CM | POA: Diagnosis not present

## 2015-04-09 DIAGNOSIS — R102 Pelvic and perineal pain: Secondary | ICD-10-CM

## 2015-04-09 IMAGING — CT CT PELVIS W/O CM
2 of 3 series · 17 of 46 positions shown, 19 images · non-contrast
Comparison: Right hip x-rays [DATE], performed prior to the
recent trauma. The x-rays from the orthopedist office are not
available for direct comparison.

CLINICAL DATA: 64-year-old who tripped over a dog crate
approximately 2 weeks ago, presenting with increasing pain in the
right groin since the injury. Negative x-rays at the orthopedic
office yesterday by report. Initial encounter.

EXAM:
CT PELVIS WITHOUT CONTRAST
TECHNIQUE: Multidetector CT imaging of the pelvis was performed following the
standard protocol without intravenous contrast.

[Series 4: pelvis st · axial · 0.74mm/px · z∈[-156,+44]mm · 14 of 48 slices shown, 16 images]
[im 4/48  soft-tissue]
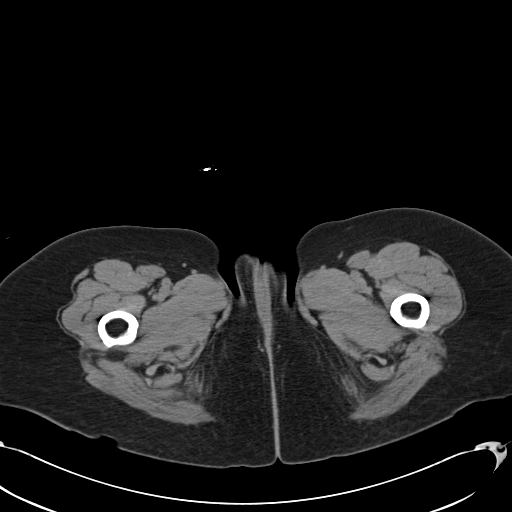
[im 4/48  bone]
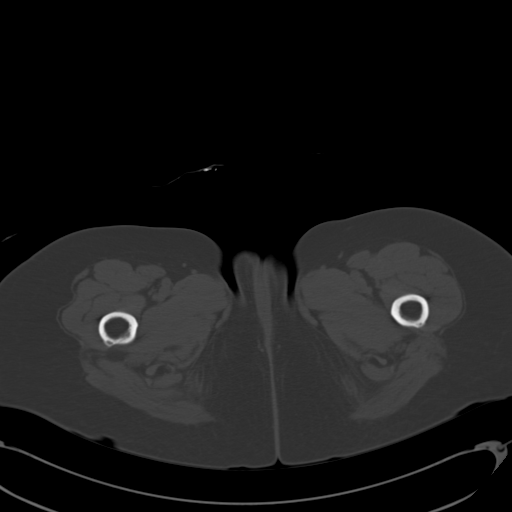
[im 7/48  soft-tissue]
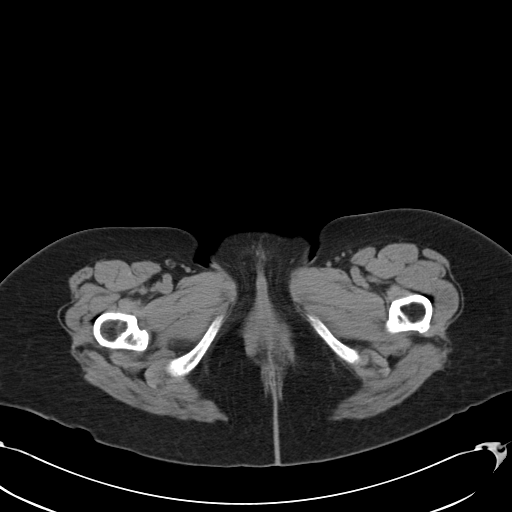
[im 10/48  soft-tissue]
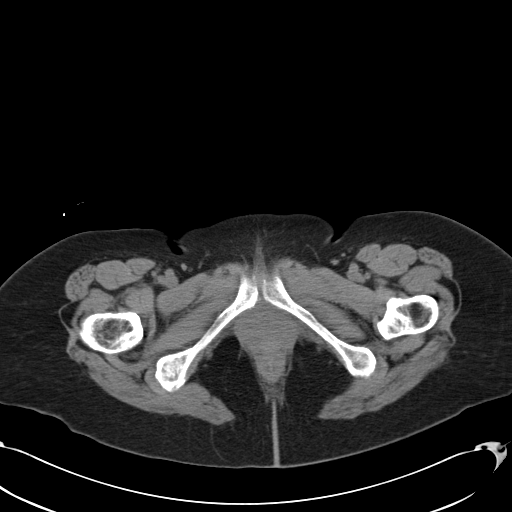
[im 13/48  soft-tissue]
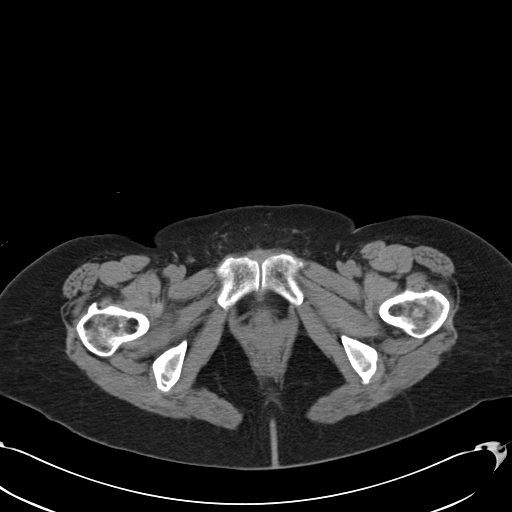
[im 16/48  soft-tissue]
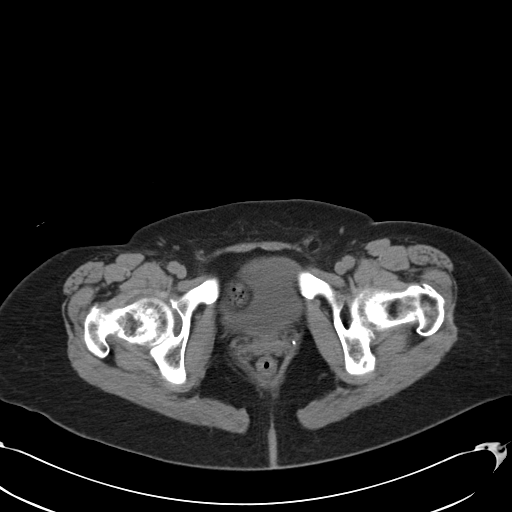
[im 19/48  soft-tissue]
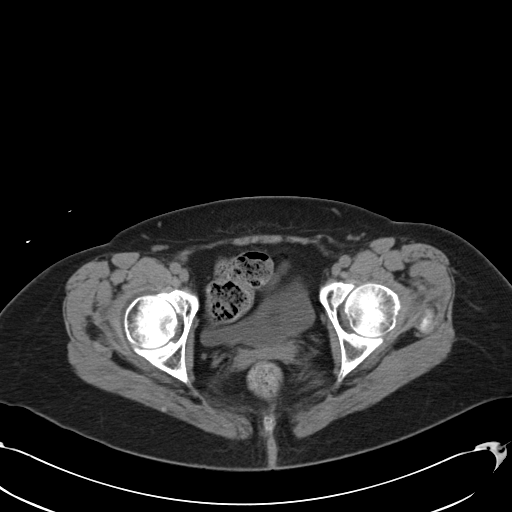
[im 22/48  soft-tissue]
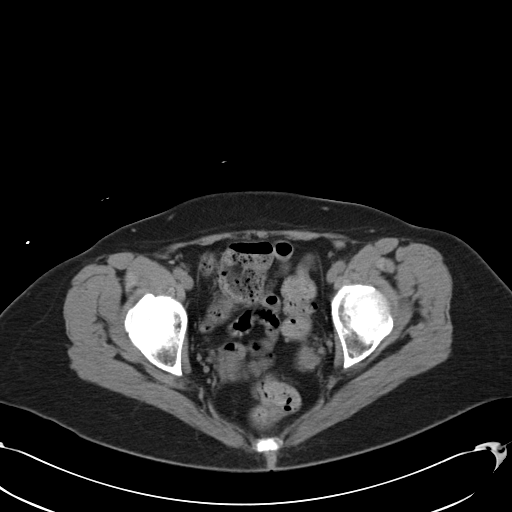
[im 26/48  soft-tissue]
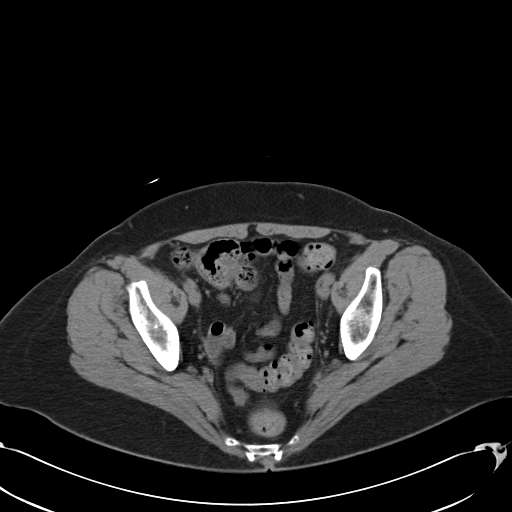
[im 29/48  soft-tissue]
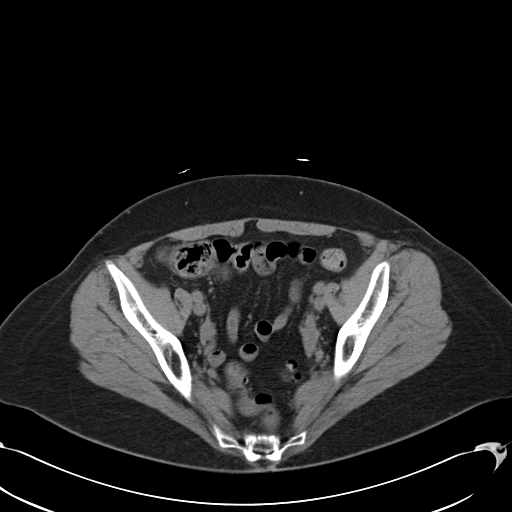
[im 29/48  bone]
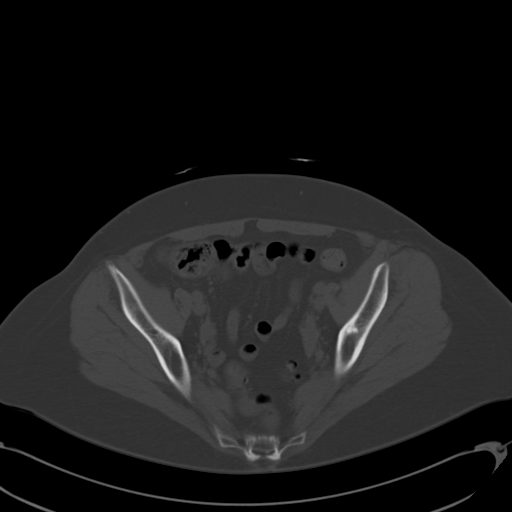
[im 32/48  soft-tissue]
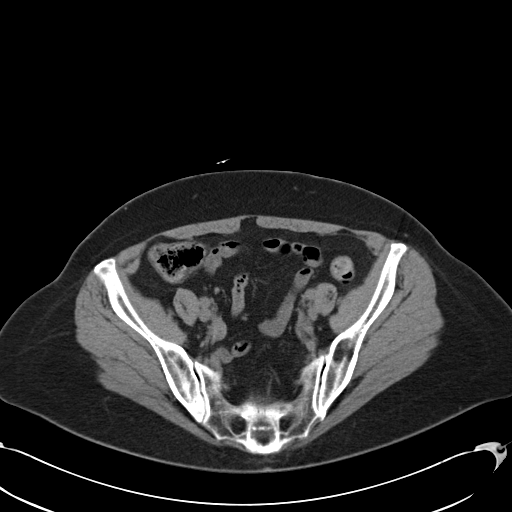
[im 35/48  soft-tissue]
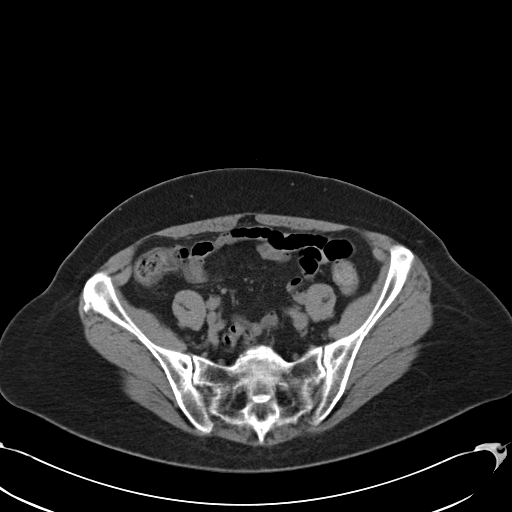
[im 38/48  soft-tissue]
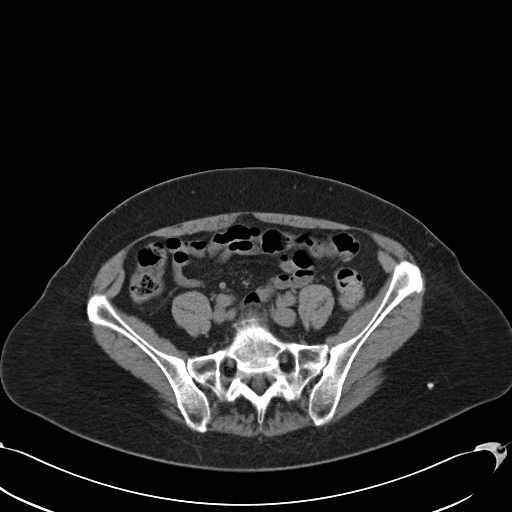
[im 41/48  soft-tissue]
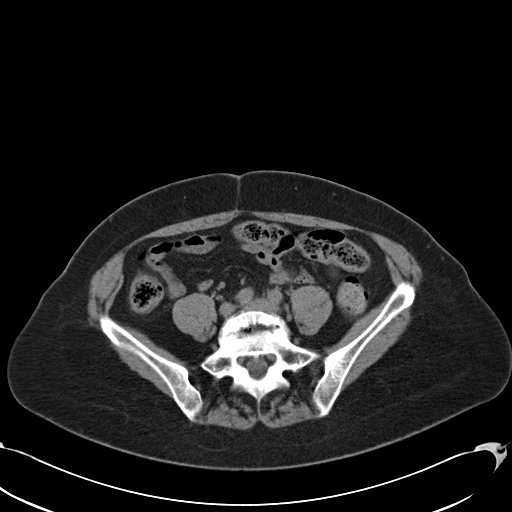
[im 44/48  soft-tissue]
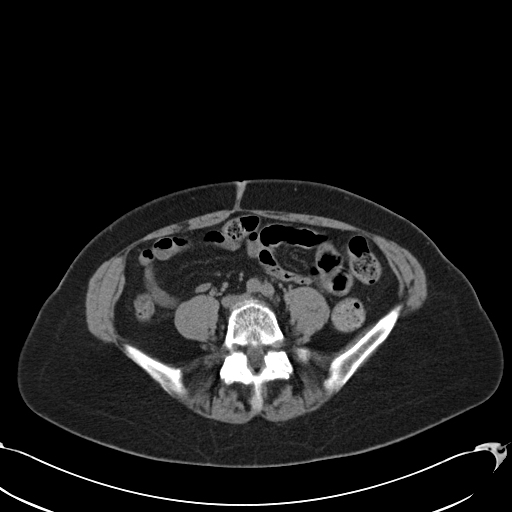

[Series 604: cor st · coronal · 0.74mm/px · 3 of 101 slices shown]
[im 34/101  soft-tissue]
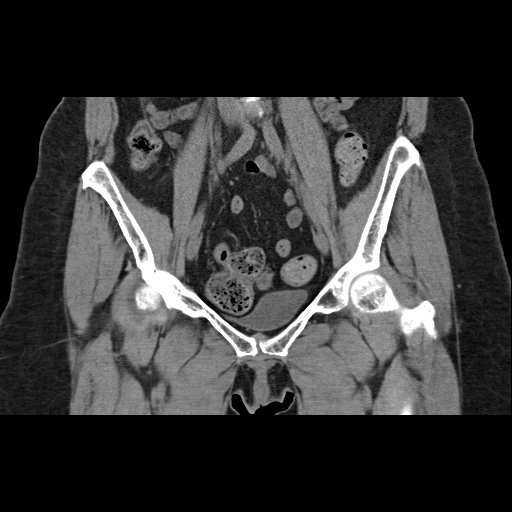
[im 45/101  soft-tissue]
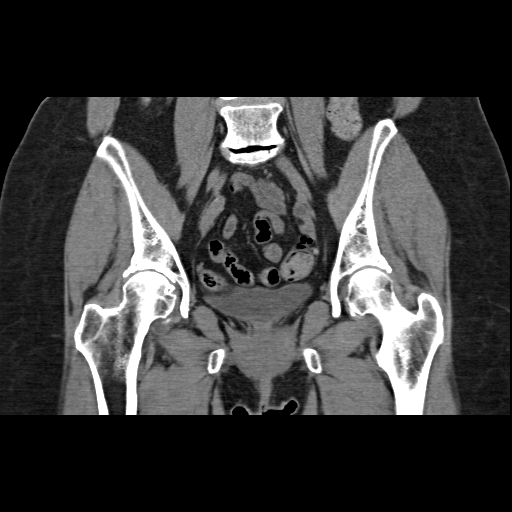
[im 56/101  soft-tissue]
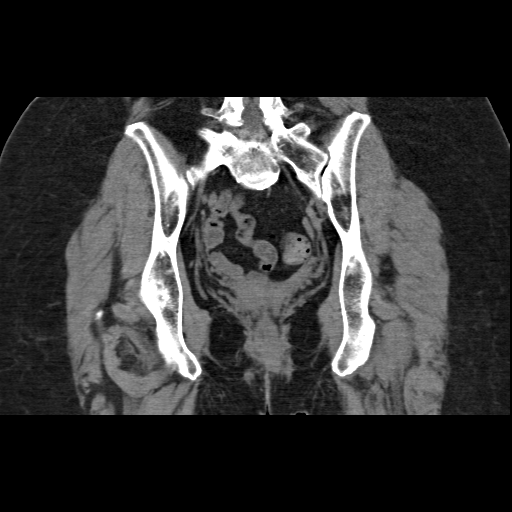

[17 of 46 positions shown; findings below may reference images not displayed]

FINDINGS: No acute fractures identified involving the lower lumbar spine,
sacrum, pelvis or either proximal femur. Well corticated osseous
fragment adjacent to the roof of the right acetabulum as noted on
the prior x-rays. Mild degenerative changes involving the sacroiliac
joints with vacuum phenomenon. Severe degenerative disc disease at
L5-S1 with vacuum disc phenomenon. Circumferential disc bulge at
L4-5. Severe facet degenerative changes bilaterally at L4-5 and
L5-S1. Symmetric mild to moderate joint space narrowing in both
hips.

Soft tissue window images demonstrate sigmoid colon diverticulosis
without evidence of acute diverticulitis. Moderate distal abdominal
aortic atherosclerosis without aneurysm.
IMPRESSION: 1. No acute or subacute osseous abnormality.
2. Well corticated osseous fragment adjacent to the roof of the
right acetabulum, unchanged since the prior right hip x-rays
[DATE]. Acetabular rim calcifications can be due to labral
calcifications/ossification, acetabular rim stress fractures, or os
acetabuli. Os acetabuli and acetabular rim stress fractures put the
patient at increased risk for pincer type femoroacetabular
impingement.
3. Symmetric mild to moderate osteoarthritis in both hips.
4. Circumferential disc bulge L4-5. Severe degenerative disc disease
at L5-S1 with vacuum disc phenomenon. Severe facet degenerative
changes bilaterally at L4-5 and L5-S1.

## 2015-04-20 DIAGNOSIS — M542 Cervicalgia: Secondary | ICD-10-CM | POA: Diagnosis not present

## 2015-04-20 DIAGNOSIS — M461 Sacroiliitis, not elsewhere classified: Secondary | ICD-10-CM | POA: Diagnosis not present

## 2015-04-20 DIAGNOSIS — M4722 Other spondylosis with radiculopathy, cervical region: Secondary | ICD-10-CM | POA: Diagnosis not present

## 2015-04-20 DIAGNOSIS — M545 Low back pain: Secondary | ICD-10-CM | POA: Diagnosis not present

## 2015-04-20 DIAGNOSIS — M5413 Radiculopathy, cervicothoracic region: Secondary | ICD-10-CM | POA: Diagnosis not present

## 2015-04-20 DIAGNOSIS — M25551 Pain in right hip: Secondary | ICD-10-CM | POA: Diagnosis not present

## 2015-04-20 DIAGNOSIS — M791 Myalgia: Secondary | ICD-10-CM | POA: Diagnosis not present

## 2015-04-23 DIAGNOSIS — M5137 Other intervertebral disc degeneration, lumbosacral region: Secondary | ICD-10-CM | POA: Diagnosis not present

## 2015-04-23 DIAGNOSIS — M6240 Contracture of muscle, unspecified site: Secondary | ICD-10-CM | POA: Diagnosis not present

## 2015-04-23 DIAGNOSIS — M545 Low back pain: Secondary | ICD-10-CM | POA: Diagnosis not present

## 2015-04-23 DIAGNOSIS — M791 Myalgia: Secondary | ICD-10-CM | POA: Diagnosis not present

## 2015-04-23 DIAGNOSIS — M9903 Segmental and somatic dysfunction of lumbar region: Secondary | ICD-10-CM | POA: Diagnosis not present

## 2015-04-23 DIAGNOSIS — M9901 Segmental and somatic dysfunction of cervical region: Secondary | ICD-10-CM | POA: Diagnosis not present

## 2015-04-26 DIAGNOSIS — M4722 Other spondylosis with radiculopathy, cervical region: Secondary | ICD-10-CM | POA: Diagnosis not present

## 2015-04-26 DIAGNOSIS — M25562 Pain in left knee: Secondary | ICD-10-CM | POA: Diagnosis not present

## 2015-04-26 DIAGNOSIS — M5137 Other intervertebral disc degeneration, lumbosacral region: Secondary | ICD-10-CM | POA: Diagnosis not present

## 2015-04-26 DIAGNOSIS — M791 Myalgia: Secondary | ICD-10-CM | POA: Diagnosis not present

## 2015-04-26 DIAGNOSIS — M461 Sacroiliitis, not elsewhere classified: Secondary | ICD-10-CM | POA: Diagnosis not present

## 2015-04-26 DIAGNOSIS — M9901 Segmental and somatic dysfunction of cervical region: Secondary | ICD-10-CM | POA: Diagnosis not present

## 2015-04-26 DIAGNOSIS — M5413 Radiculopathy, cervicothoracic region: Secondary | ICD-10-CM | POA: Diagnosis not present

## 2015-04-26 DIAGNOSIS — M6240 Contracture of muscle, unspecified site: Secondary | ICD-10-CM | POA: Diagnosis not present

## 2015-04-26 DIAGNOSIS — M9903 Segmental and somatic dysfunction of lumbar region: Secondary | ICD-10-CM | POA: Diagnosis not present

## 2015-04-28 DIAGNOSIS — M545 Low back pain: Secondary | ICD-10-CM | POA: Diagnosis not present

## 2015-04-28 DIAGNOSIS — M5417 Radiculopathy, lumbosacral region: Secondary | ICD-10-CM | POA: Diagnosis not present

## 2015-04-28 DIAGNOSIS — M791 Myalgia: Secondary | ICD-10-CM | POA: Diagnosis not present

## 2015-04-28 DIAGNOSIS — M4722 Other spondylosis with radiculopathy, cervical region: Secondary | ICD-10-CM | POA: Diagnosis not present

## 2015-04-28 DIAGNOSIS — M5413 Radiculopathy, cervicothoracic region: Secondary | ICD-10-CM | POA: Diagnosis not present

## 2015-04-28 DIAGNOSIS — M9903 Segmental and somatic dysfunction of lumbar region: Secondary | ICD-10-CM | POA: Diagnosis not present

## 2015-04-28 DIAGNOSIS — M6240 Contracture of muscle, unspecified site: Secondary | ICD-10-CM | POA: Diagnosis not present

## 2015-04-28 DIAGNOSIS — M461 Sacroiliitis, not elsewhere classified: Secondary | ICD-10-CM | POA: Diagnosis not present

## 2015-04-28 DIAGNOSIS — M5137 Other intervertebral disc degeneration, lumbosacral region: Secondary | ICD-10-CM | POA: Diagnosis not present

## 2015-04-28 DIAGNOSIS — M9901 Segmental and somatic dysfunction of cervical region: Secondary | ICD-10-CM | POA: Diagnosis not present

## 2015-04-29 DIAGNOSIS — M9901 Segmental and somatic dysfunction of cervical region: Secondary | ICD-10-CM | POA: Diagnosis not present

## 2015-04-29 DIAGNOSIS — M6240 Contracture of muscle, unspecified site: Secondary | ICD-10-CM | POA: Diagnosis not present

## 2015-04-29 DIAGNOSIS — M5137 Other intervertebral disc degeneration, lumbosacral region: Secondary | ICD-10-CM | POA: Diagnosis not present

## 2015-04-29 DIAGNOSIS — M545 Low back pain: Secondary | ICD-10-CM | POA: Diagnosis not present

## 2015-04-29 DIAGNOSIS — M9903 Segmental and somatic dysfunction of lumbar region: Secondary | ICD-10-CM | POA: Diagnosis not present

## 2015-04-29 DIAGNOSIS — M4722 Other spondylosis with radiculopathy, cervical region: Secondary | ICD-10-CM | POA: Diagnosis not present

## 2015-04-29 DIAGNOSIS — M461 Sacroiliitis, not elsewhere classified: Secondary | ICD-10-CM | POA: Diagnosis not present

## 2015-04-29 DIAGNOSIS — M791 Myalgia: Secondary | ICD-10-CM | POA: Diagnosis not present

## 2015-04-29 DIAGNOSIS — M5413 Radiculopathy, cervicothoracic region: Secondary | ICD-10-CM | POA: Diagnosis not present

## 2015-05-03 DIAGNOSIS — R220 Localized swelling, mass and lump, head: Secondary | ICD-10-CM | POA: Diagnosis not present

## 2015-05-03 DIAGNOSIS — M533 Sacrococcygeal disorders, not elsewhere classified: Secondary | ICD-10-CM | POA: Diagnosis not present

## 2015-05-03 DIAGNOSIS — Z8709 Personal history of other diseases of the respiratory system: Secondary | ICD-10-CM | POA: Diagnosis not present

## 2015-05-04 ENCOUNTER — Other Ambulatory Visit: Payer: Self-pay | Admitting: Orthopedic Surgery

## 2015-05-04 DIAGNOSIS — M532X8 Spinal instabilities, sacral and sacrococcygeal region: Secondary | ICD-10-CM

## 2015-05-07 ENCOUNTER — Ambulatory Visit
Admission: RE | Admit: 2015-05-07 | Discharge: 2015-05-07 | Disposition: A | Discharge status: Home / Self Care | Payer: Medicare Other | Source: Ambulatory Visit | Attending: Orthopedic Surgery | Admitting: Orthopedic Surgery

## 2015-05-07 DIAGNOSIS — M532X8 Spinal instabilities, sacral and sacrococcygeal region: Secondary | ICD-10-CM

## 2015-05-07 DIAGNOSIS — M545 Low back pain: Secondary | ICD-10-CM | POA: Diagnosis not present

## 2015-05-07 IMAGING — CT CT BIOPSY
1 of 6 series · 10 of 32 positions shown, 16 images · non-contrast
Comparison: none

CLINICAL DATA: Right low back pain

[Series 2: si joint injection · axial · 0.75mm/px · z∈[+909,+978]mm · 10 of 29 slices shown, 16 images]
[im 3/29  soft-tissue]
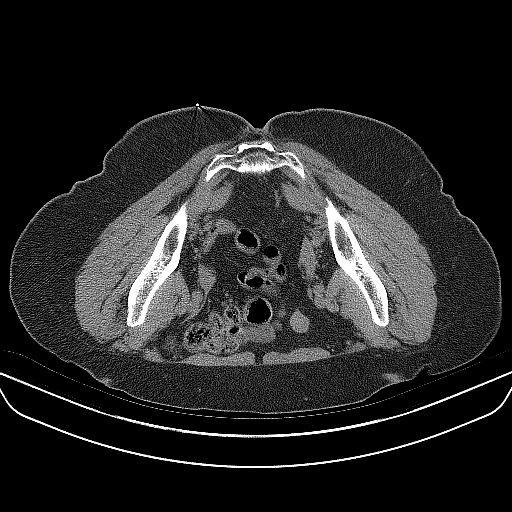
[im 3/29  bone]
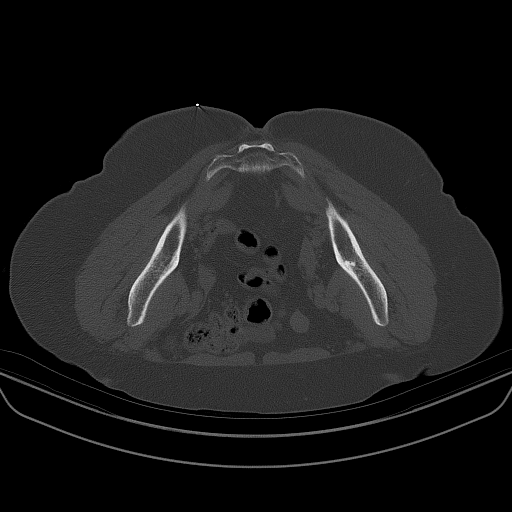
[im 6/29  soft-tissue]
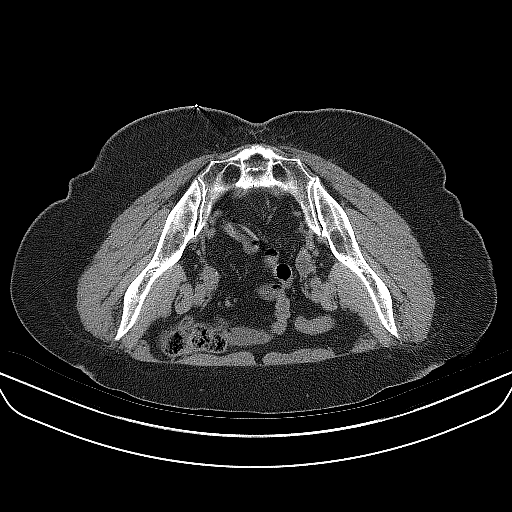
[im 8/29  soft-tissue]
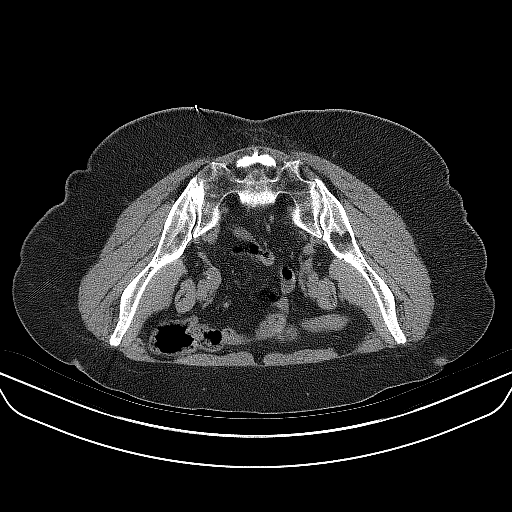
[im 11/29  soft-tissue]
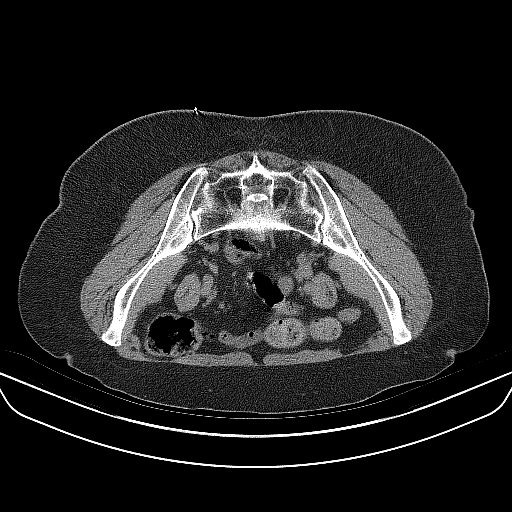
[im 13/29  soft-tissue]
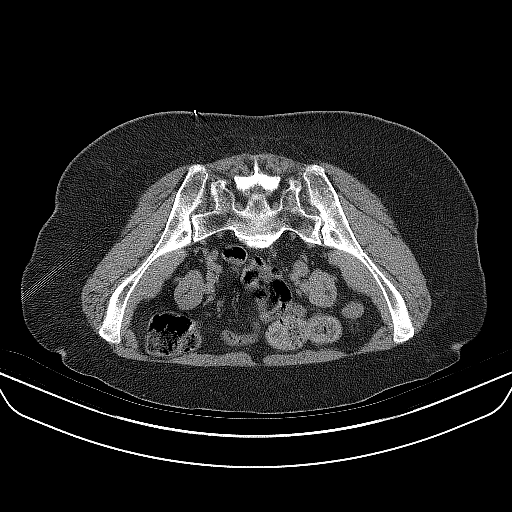
[im 16/29  soft-tissue]
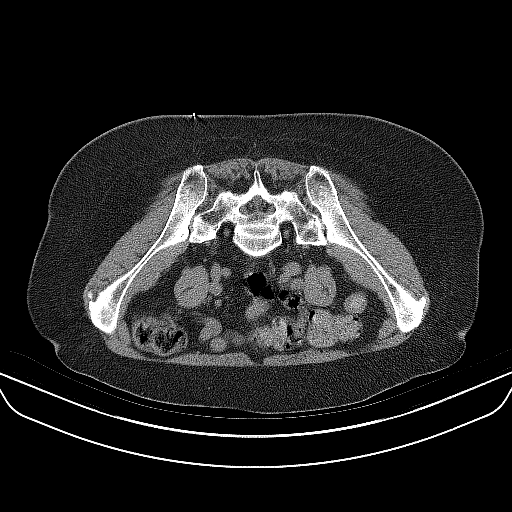
[im 18/29  soft-tissue]
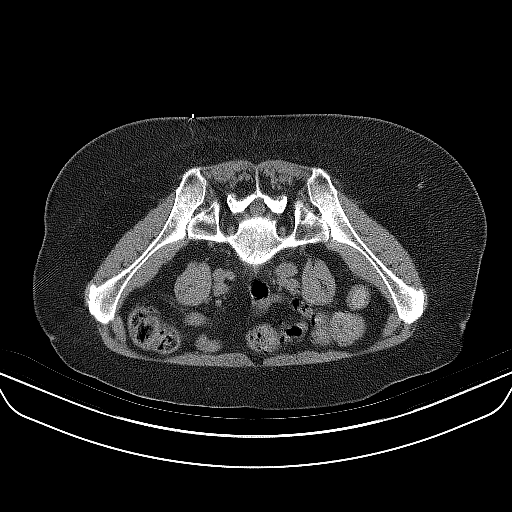
[im 18/29  lung]
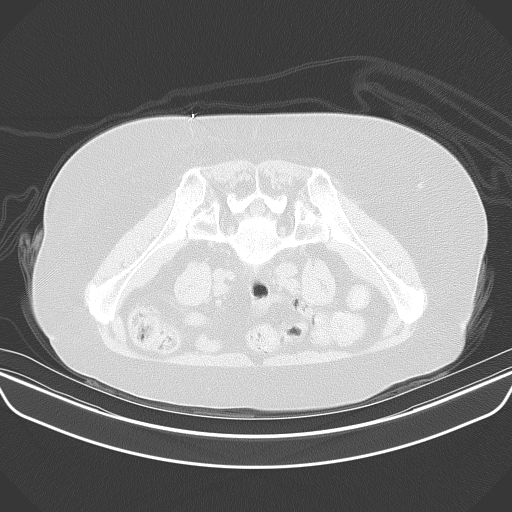
[im 21/29  soft-tissue]
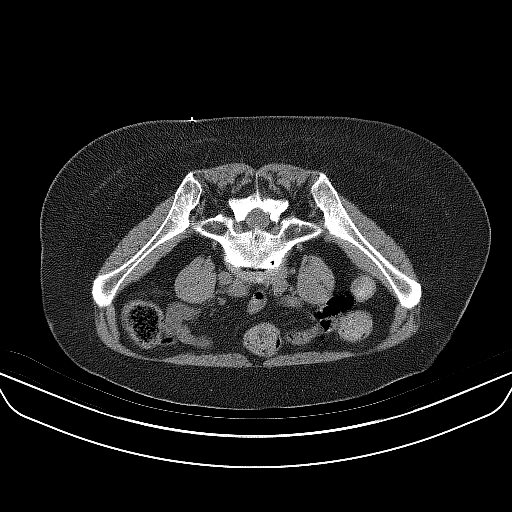
[im 21/29  lung]
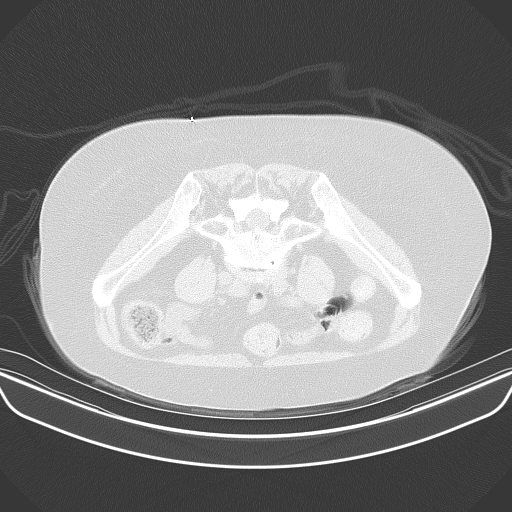
[im 23/29  soft-tissue]
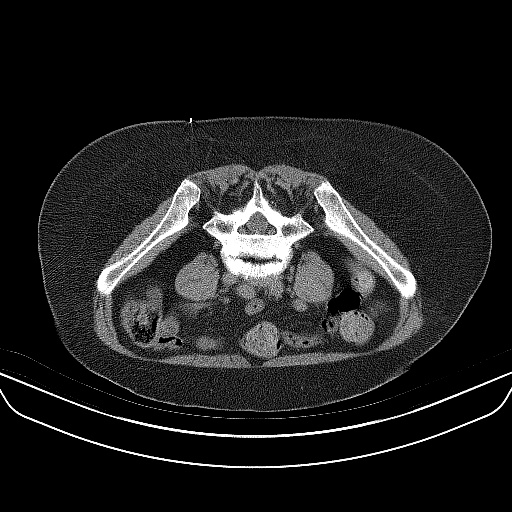
[im 23/29  lung]
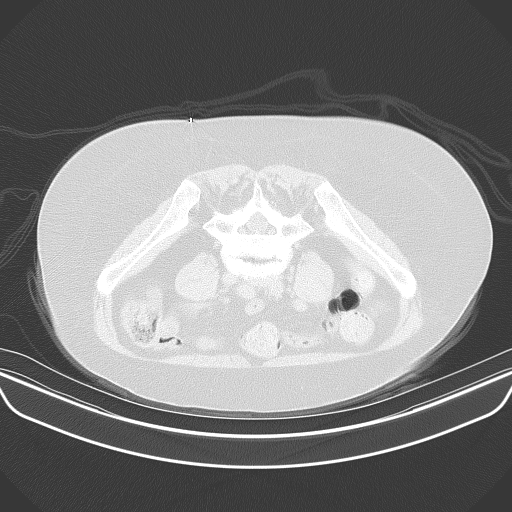
[im 23/29  bone]
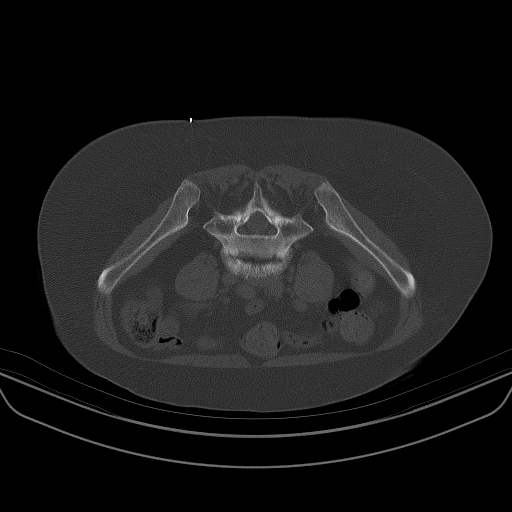
[im 26/29  soft-tissue]
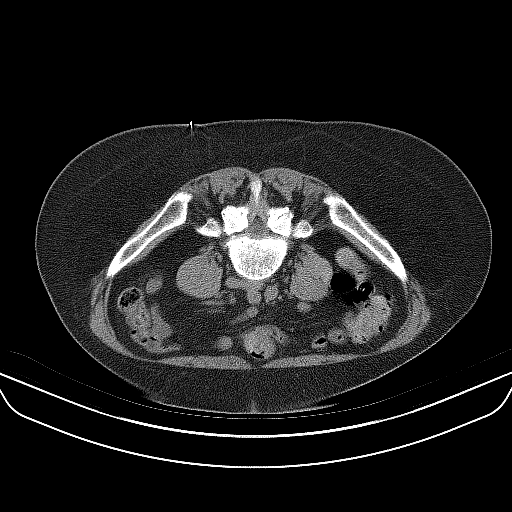
[im 26/29  lung]
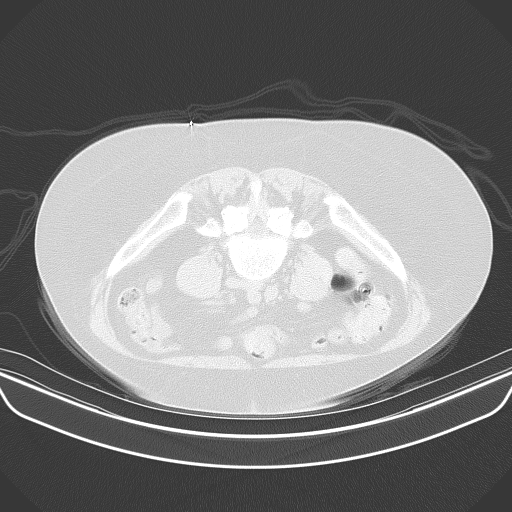

[10 of 32 positions shown; findings below may reference images not displayed]

EXAM:
CT guided right SI joint local anesthetic injection

FLUOROSCOPY TIME:  None

PROCEDURE:
The procedure, risks, benefits, and alternatives were explained to
the patient. Questions regarding the procedure were encouraged and
answered. The patient understands and consents to the procedure.

Right SACROILIAC JOINT INJECTION: The skin dorsal to the sacroiliac
joint was cleansed and anesthetized. A 22 gauge curved needle was
directed into the inferior aspect of the sacroiliac joint under CT
guidance. 4 cc 0.5% bupivacaine were were injected into the joint.
FINDINGS: CT imaging confirms needle placement within the right SI joint

COMPLICATIONS:
None
IMPRESSION: Technically successful right sacroiliac joint injection under CT
guidance, utilizing local anesthetic.

## 2015-05-10 DIAGNOSIS — M533 Sacrococcygeal disorders, not elsewhere classified: Secondary | ICD-10-CM | POA: Diagnosis not present

## 2015-05-10 DIAGNOSIS — M25551 Pain in right hip: Secondary | ICD-10-CM | POA: Diagnosis not present

## 2015-05-11 DIAGNOSIS — M25551 Pain in right hip: Secondary | ICD-10-CM | POA: Diagnosis not present

## 2015-05-11 DIAGNOSIS — M1611 Unilateral primary osteoarthritis, right hip: Secondary | ICD-10-CM | POA: Diagnosis not present

## 2015-05-14 ENCOUNTER — Other Ambulatory Visit: Payer: Self-pay | Admitting: Orthopedic Surgery

## 2015-05-19 DIAGNOSIS — M1611 Unilateral primary osteoarthritis, right hip: Secondary | ICD-10-CM | POA: Diagnosis not present

## 2015-05-20 DIAGNOSIS — M9903 Segmental and somatic dysfunction of lumbar region: Secondary | ICD-10-CM | POA: Diagnosis not present

## 2015-05-20 DIAGNOSIS — M5137 Other intervertebral disc degeneration, lumbosacral region: Secondary | ICD-10-CM | POA: Diagnosis not present

## 2015-05-20 DIAGNOSIS — M461 Sacroiliitis, not elsewhere classified: Secondary | ICD-10-CM | POA: Diagnosis not present

## 2015-05-20 DIAGNOSIS — M791 Myalgia: Secondary | ICD-10-CM | POA: Diagnosis not present

## 2015-05-20 DIAGNOSIS — M545 Low back pain: Secondary | ICD-10-CM | POA: Diagnosis not present

## 2015-05-20 DIAGNOSIS — M6240 Contracture of muscle, unspecified site: Secondary | ICD-10-CM | POA: Diagnosis not present

## 2015-05-20 DIAGNOSIS — M5413 Radiculopathy, cervicothoracic region: Secondary | ICD-10-CM | POA: Diagnosis not present

## 2015-05-20 DIAGNOSIS — M9901 Segmental and somatic dysfunction of cervical region: Secondary | ICD-10-CM | POA: Diagnosis not present

## 2015-05-20 DIAGNOSIS — M4722 Other spondylosis with radiculopathy, cervical region: Secondary | ICD-10-CM | POA: Diagnosis not present

## 2015-05-24 DIAGNOSIS — M4722 Other spondylosis with radiculopathy, cervical region: Secondary | ICD-10-CM | POA: Diagnosis not present

## 2015-05-24 DIAGNOSIS — M5413 Radiculopathy, cervicothoracic region: Secondary | ICD-10-CM | POA: Diagnosis not present

## 2015-05-24 DIAGNOSIS — M6240 Contracture of muscle, unspecified site: Secondary | ICD-10-CM | POA: Diagnosis not present

## 2015-05-24 DIAGNOSIS — M9901 Segmental and somatic dysfunction of cervical region: Secondary | ICD-10-CM | POA: Diagnosis not present

## 2015-05-24 DIAGNOSIS — M461 Sacroiliitis, not elsewhere classified: Secondary | ICD-10-CM | POA: Diagnosis not present

## 2015-05-24 DIAGNOSIS — M5137 Other intervertebral disc degeneration, lumbosacral region: Secondary | ICD-10-CM | POA: Diagnosis not present

## 2015-05-24 DIAGNOSIS — M791 Myalgia: Secondary | ICD-10-CM | POA: Diagnosis not present

## 2015-05-24 DIAGNOSIS — M545 Low back pain: Secondary | ICD-10-CM | POA: Diagnosis not present

## 2015-05-24 DIAGNOSIS — M9903 Segmental and somatic dysfunction of lumbar region: Secondary | ICD-10-CM | POA: Diagnosis not present

## 2015-05-25 ENCOUNTER — Encounter (HOSPITAL_COMMUNITY): Payer: Self-pay

## 2015-05-25 ENCOUNTER — Encounter (HOSPITAL_COMMUNITY)
Admission: RE | Admit: 2015-05-25 | Discharge: 2015-05-25 | Disposition: A | Discharge status: Home / Self Care | Payer: Medicare Other | Source: Ambulatory Visit | Attending: Orthopedic Surgery | Admitting: Orthopedic Surgery

## 2015-05-25 DIAGNOSIS — Z7982 Long term (current) use of aspirin: Secondary | ICD-10-CM | POA: Insufficient documentation

## 2015-05-25 DIAGNOSIS — Z79899 Other long term (current) drug therapy: Secondary | ICD-10-CM | POA: Insufficient documentation

## 2015-05-25 DIAGNOSIS — M1611 Unilateral primary osteoarthritis, right hip: Secondary | ICD-10-CM | POA: Diagnosis not present

## 2015-05-25 DIAGNOSIS — Z0183 Encounter for blood typing: Secondary | ICD-10-CM | POA: Insufficient documentation

## 2015-05-25 DIAGNOSIS — Z01818 Encounter for other preprocedural examination: Secondary | ICD-10-CM | POA: Diagnosis not present

## 2015-05-25 DIAGNOSIS — I447 Left bundle-branch block, unspecified: Secondary | ICD-10-CM | POA: Insufficient documentation

## 2015-05-25 DIAGNOSIS — I10 Essential (primary) hypertension: Secondary | ICD-10-CM | POA: Diagnosis not present

## 2015-05-25 DIAGNOSIS — Z01812 Encounter for preprocedural laboratory examination: Secondary | ICD-10-CM | POA: Diagnosis not present

## 2015-05-25 HISTORY — DX: Unspecified osteoarthritis, unspecified site: M19.90

## 2015-05-25 HISTORY — DX: Personal history of other diseases of the digestive system: Z87.19

## 2015-05-25 HISTORY — DX: Other specified postprocedural states: Z98.890

## 2015-05-25 HISTORY — DX: Gastro-esophageal reflux disease without esophagitis: K21.9

## 2015-05-25 HISTORY — DX: Angina pectoris, unspecified: I20.9

## 2015-05-25 HISTORY — DX: Nausea with vomiting, unspecified: R11.2

## 2015-05-25 HISTORY — DX: Personal history of other specified conditions: Z87.898

## 2015-05-25 LAB — SURGICAL PCR SCREEN
MRSA, PCR: NEGATIVE
Staphylococcus aureus: NEGATIVE

## 2015-05-25 LAB — BASIC METABOLIC PANEL
Anion gap: 8 (ref 5–15)
BUN: 7 mg/dL (ref 6–20)
CO2: 27 mmol/L (ref 22–32)
Calcium: 9.2 mg/dL (ref 8.9–10.3)
Chloride: 98 mmol/L — ABNORMAL LOW (ref 101–111)
Creatinine, Ser: 0.85 mg/dL (ref 0.44–1.00)
GFR calc Af Amer: >60 mL/min (ref >60)
GFR calc non Af Amer: >60 mL/min (ref >60)
Glucose, Bld: 99 mg/dL (ref 65–99)
Potassium: 3.7 mmol/L (ref 3.5–5.1)
Sodium: 133 mmol/L — ABNORMAL LOW (ref 135–145)

## 2015-05-25 LAB — TYPE AND SCREEN
ABO/RH(D): O POS
Antibody Screen: NEGATIVE

## 2015-05-25 LAB — URINALYSIS, ROUTINE W REFLEX MICROSCOPIC
Bilirubin Urine: NEGATIVE
Glucose, UA: NEGATIVE mg/dL
Hgb urine dipstick: NEGATIVE
Ketones, ur: NEGATIVE mg/dL
Leukocytes, UA: NEGATIVE
Nitrite: NEGATIVE
Protein, ur: NEGATIVE mg/dL
Specific Gravity, Urine: 1.008 (ref 1.005–1.030)
pH: 6.5 (ref 5.0–8.0)

## 2015-05-25 LAB — CBC WITH DIFFERENTIAL/PLATELET
Basophils Absolute: 0 10*3/uL (ref 0.0–0.1)
Basophils Relative: 1 %
Eosinophils Absolute: 0.2 10*3/uL (ref 0.0–0.7)
Eosinophils Relative: 3 %
HCT: 40.6 % (ref 36.0–46.0)
Hemoglobin: 13.5 g/dL (ref 12.0–15.0)
Lymphocytes Relative: 32 %
Lymphs Abs: 1.7 10*3/uL (ref 0.7–4.0)
MCH: 29.5 pg (ref 26.0–34.0)
MCHC: 33.3 g/dL (ref 30.0–36.0)
MCV: 88.6 fL (ref 78.0–100.0)
Monocytes Absolute: 0.5 10*3/uL (ref 0.1–1.0)
Monocytes Relative: 9 %
Neutro Abs: 3 10*3/uL (ref 1.7–7.7)
Neutrophils Relative %: 55 %
Platelets: 266 10*3/uL (ref 150–400)
RBC: 4.58 MIL/uL (ref 3.87–5.11)
RDW: 13.4 % (ref 11.5–15.5)
WBC: 5.4 10*3/uL (ref 4.0–10.5)

## 2015-05-25 LAB — PROTIME-INR
INR: 1.07 (ref 0.00–1.49)
Prothrombin Time: 14.1 seconds (ref 11.6–15.2)

## 2015-05-25 LAB — ABO/RH: ABO/RH(D): O POS

## 2015-05-25 LAB — APTT: aPTT: 32 seconds (ref 24–37)

## 2015-05-25 IMAGING — CR DG CHEST 2V
2 series · 2 of 2 positions shown · non-contrast
Comparison: [DATE]

CLINICAL DATA: Preoperative assessment for RIGHT anterior hip
arthroplasty, history hypertension, LEFT bundle branch block

EXAM:
CHEST  2 VIEW

[w chest pa]
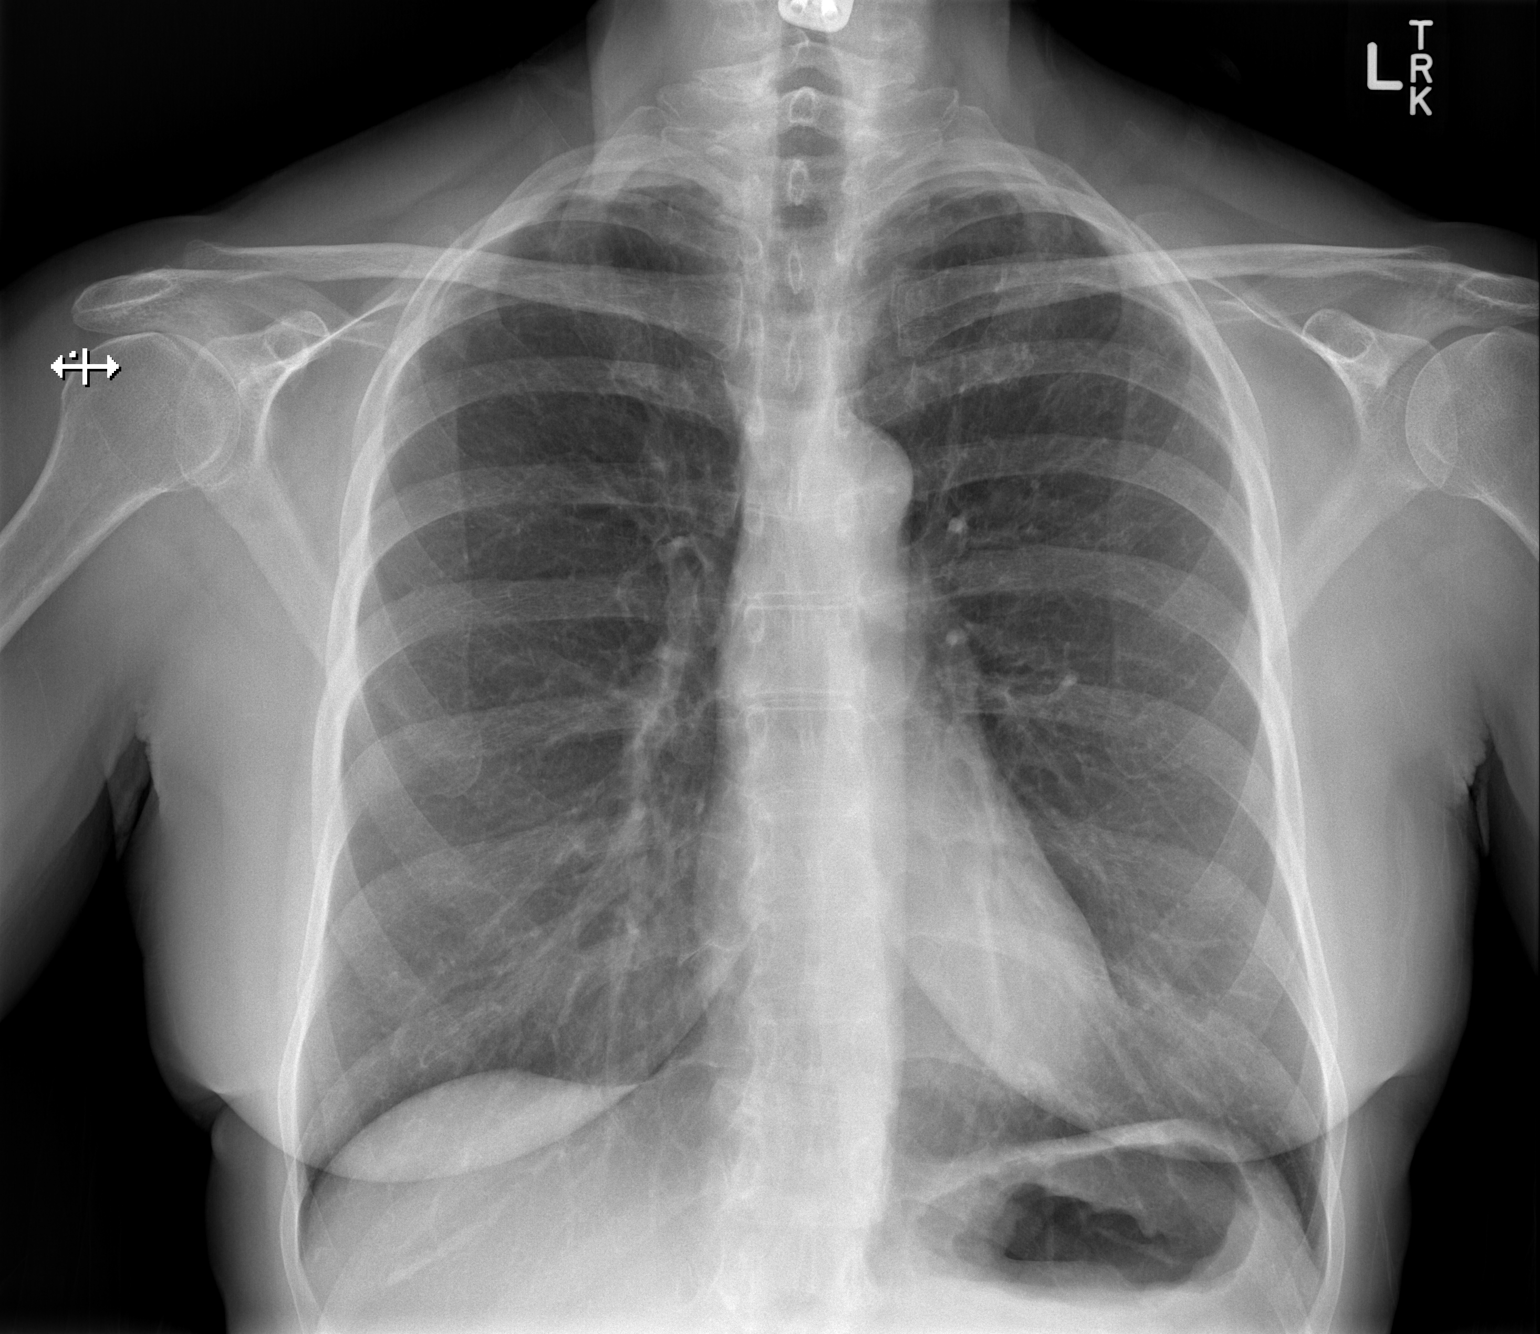

[w chest lat]
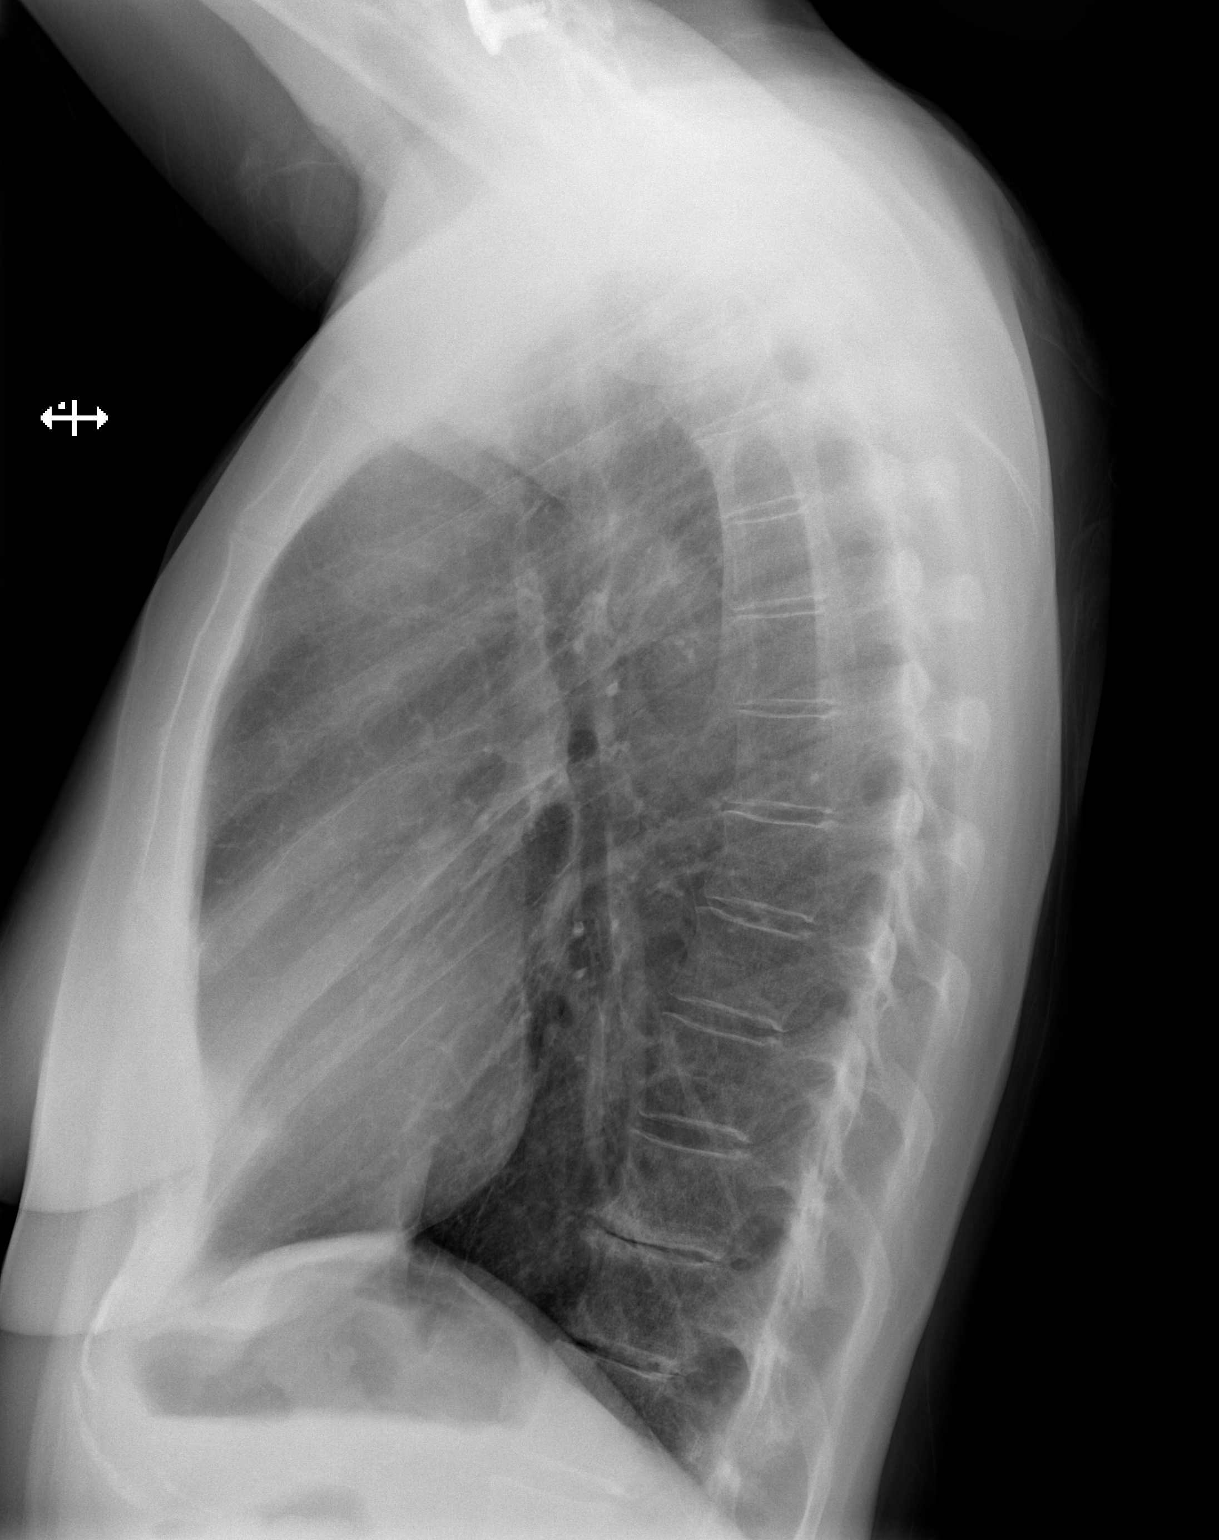

[2 of 2 positions shown; findings below may reference images not displayed]

FINDINGS: Normal heart size, mediastinal contours, and pulmonary vascularity.

Lungs hyperinflated but clear.

Minimal biapical scarring.

No acute infiltrate, pleural effusion or pneumothorax.

Prior cervical spine fusion.

No acute osseous findings.
IMPRESSION: Hyperinflated lungs without acute abnormalities.

## 2015-05-25 NOTE — Progress Notes (Signed)
Patient had CP in 2011 and was admitted to [facility] hospital for this.  She had positive enzymes and a cardiac catheterization and ECHO done at this time.  ECHO is in EPIC and Cath can be seen in 11/11/2009 under MEDIA tab.  She states that they discovered it was due to very low potassium and has not followed up with a cardiologist since. She also states that she has hx of a left bundle branch block. EKG/CXR pending.   Patient's PCP is Shamleffer, Herschell Dimes, MD

## 2015-05-25 NOTE — Pre-Procedure Instructions (Signed)
Danielle Garrett  05/25/2015      [id number] [address],  - [address] [place] [address] Phone: [phone] Fax: [phone]  [facility], [address] [place] [address] Phone: [phone] Fax: [phone]    Your procedure is scheduled on Monday Feb 20th at 145pm.  Report to [place] at 1145 A.M.  Call this number if you have problems the morning of surgery:  [phone]   Remember:  Do not eat food or drink liquids after midnight Sunday February 19th.  Take these medicines the morning of surgery with A SIP OF WATER amlodipine (norvasc), fluoxetine (prozac), pantoprazole (protonix), sucralfate (carafate), tramadol (ultram) as needed, nasal spray as needed, docusate sodium (colace) as needed  STOP: ALL Vitamins, Supplements, Effient and Herbal Medications, Fish Oils, Aspirins, NSAIDs (Nonsteroidal Anti-inflammatories such as Ibuprofen, Aleve, or Advil), and Goody's/BC Powders 7 days prior to surgery, until after surgery as directed by your physician.    Do not wear jewelry, make-up or nail polish.  Do not wear lotions, powders, or perfumes.  You may wear deodorant.  Do not shave 48 hours prior to surgery.  Men may shave face and neck.  Do not bring valuables to the hospital.  [facility] is not responsible for any belongings or valuables.  Contacts, dentures or bridgework may not be worn into surgery.  Leave your suitcase in the car.  After surgery it may be brought to your room.  For patients admitted to the hospital, discharge time will be determined by your treatment team.  Patients discharged the day of surgery will not be allowed to drive home.   Please read over the following fact sheets that you were given. Pain Booklet, Coughing and Deep Breathing, Blood Transfusion Information, MRSA Information and Surgical Site Infection  Prevention        Preparing for Surgery at [facility]  Before surgery, you can play an important role.  Because skin is not sterile, your skin needs to be as free of germs as possible.  You can reduce the number of germs on your skin by washing with CHG (chlorahexidine gluconate) Soap before surgery.  CHG is an antiseptic cleaner with kills germs and bonds with the skin to continue killing germs even after washing.   Please do not use if you have an allergy to CHG or antibacterial soaps.  If your skin becomes reddened/irritated stop using the CHG.  Do not shave (including legs and underarms) for at least 48 hours prior to first CHG shower.  It is okay to shave your face.  Please follow these instructions carefully:  1. Shower with CHG Soap the night before surgery and the morning of Surgery. 2. If you choose to wash your hair, wash your hair first as usual with your normal shampoo. 3. After you shampoo, rinse your hair and body thoroughly to remove the Shampoo. 4. Use CHG as you would any other liquid soap. You can apply chg directly to the skin and wash gently with scrungie or a clean washcloth. 5. Apply the CHG Soap to your body ONLY FROM THE NECK DOWN. Do not use on open wounds or open sores. Avoid contact with your eyes, ears, mouth and genitals (private parts). Wash genitals (private parts) with your normal soap. 6. Wash thoroughly, paying special attention to the area where your surgery will be performed. 7. Thoroughly rinse  your body with warm water from the neck down. 8. DO NOT shower/wash with your normal soap after using and rinsing off the CHG Soap. 9. Pat yourself dry with a clean towel.  10. Wear clean pajamas.  11. Place clean sheets on your bed the night of your first shower and do not sleep with pets.  Day of Surgery  Do not apply any  lotions/deodorants the morning of surgery. Please wear clean clothes to the hospital/surgery center.

## 2015-05-27 NOTE — Progress Notes (Addendum)
Anesthesia Chart Review:  Pt is a 65 year old female scheduled for R total hip arthroplasty anterior approach on 06/07/2015 with Dr. Mayer Camel.   PMH includes:  HTN, LBBB, post-op N/V. Never smoker. BMI 24  Medications include: amlodipine, ASA, losartan, protonix  Preoperative labs reviewed.    Chest x-ray 05/25/15 reviewed. Hyperinflated lungs without acute abnormalities.  EKG 05/25/15: NSR. Left axis deviation.Left bundle branch block is new since previous tracing.   Cardiac cath 11/10/09:  1. Normal coronary arteries. 2. Normal LV function  Echo 11/09/09:  - Left ventricle: There was mild concentric hypertrophy. Systolic function was normal. The estimated ejection fraction was in the range of 50% to 55%. - Ventricular septum: Septal motion showed abnormal function. These changes are consistent with a left bundle branch block. - Mitral valve: Mild regurgitation. - Right ventricle: Systolic pressure was increased. - Pulmonary arteries: PA peak pressure: 72mm Hg (S).  Reviewed case with Dr. Linna Caprice.   If no changes, I anticipate pt can proceed with surgery as scheduled.   Willeen Cass, FNP-BC [facility]/Anesthesiology Phone: [phone] 05/27/2015 4:09 PM  Addendum: Surgeon sent patient for cardiology clearance. She was seen by Dr. Daneen Schick today. He felt EKG was stable and cleared her for surgery. PRN cardiology follow-up recommended.  George Hugh [facility] Stay Center/Anesthesiology Phone [phone] 06/03/2015 12:59 PM

## 2015-06-02 DIAGNOSIS — Z96642 Presence of left artificial hip joint: Secondary | ICD-10-CM | POA: Insufficient documentation

## 2015-06-02 DIAGNOSIS — R079 Chest pain, unspecified: Secondary | ICD-10-CM | POA: Insufficient documentation

## 2015-06-02 DIAGNOSIS — I447 Left bundle-branch block, unspecified: Secondary | ICD-10-CM | POA: Insufficient documentation

## 2015-06-02 DIAGNOSIS — Z01818 Encounter for other preprocedural examination: Secondary | ICD-10-CM | POA: Insufficient documentation

## 2015-06-03 ENCOUNTER — Ambulatory Visit (INDEPENDENT_AMBULATORY_CARE_PROVIDER_SITE_OTHER): Payer: Medicare Other | Admitting: Interventional Cardiology

## 2015-06-03 ENCOUNTER — Encounter: Payer: Self-pay | Admitting: Interventional Cardiology

## 2015-06-03 ENCOUNTER — Telehealth: Payer: Self-pay

## 2015-06-03 VITALS — BP 138/84 | HR 89 | Ht 61.0 in | Wt 126.4 lb

## 2015-06-03 DIAGNOSIS — Z96642 Presence of left artificial hip joint: Secondary | ICD-10-CM

## 2015-06-03 DIAGNOSIS — Z01818 Encounter for other preprocedural examination: Secondary | ICD-10-CM

## 2015-06-03 DIAGNOSIS — R079 Chest pain, unspecified: Secondary | ICD-10-CM

## 2015-06-03 DIAGNOSIS — I447 Left bundle-branch block, unspecified: Secondary | ICD-10-CM

## 2015-06-03 NOTE — Telephone Encounter (Signed)
Clearance placed in MR nurse fax bo to be faxed to [facility] attn: Dr.Rowan

## 2015-06-03 NOTE — Progress Notes (Signed)
Cardiology Office Note   Date:  06/03/2015   ID:  Danielle Garrett, DOB [birth year], MRN [id number]  PCP:  Lottie Dawson, MD  Cardiologist:  Sinclair Grooms, MD   Chief Complaint  Patient presents with  . Abnormal ECG      History of Present Illness: Danielle Garrett is a 65 y.o. female who presents for clearance for right hip replacement surgery. The concern is a left bundle branch block on preoperative EKG.  The patient has long-standing documented left bundle branch block. An episode of chest discomfort in 2011 that led to hospitalization. Because of the presentation in the setting of left bundle branch block, coronary angiography was performed and was entirely normal including left ventricular function. In the a.m. her arm she has had no cardiovascular symptoms of chest discomfort, dyspnea, orthopnea, edema, palpitations, syncope, or near-syncope. Relatively sedentary as her hip has gotten progressively worse.  Denies tobacco use, diabetes mellitus, and family history of coronary disease. Does have a history of hypertension.  Past Medical History  Diagnosis Date  . Hypertension   . Gastroparesis   . Anxiety   . Pyloric stenosis   . Childhood asthma   . Chronic hoarseness   . Allergic rhinitis due to other allergen   . PONV (postoperative nausea and vomiting)   . Left bundle branch block      (abnormal heart rhythm)  . Anginal pain ([place]) 2011    hospitalized for CP in the past; due to low potassium  . GERD (gastroesophageal reflux disease)   . History of hiatal hernia   . Arthritis   . Hx of blood transfusion reaction     in the early 80s    Past Surgical History  Procedure Laterality Date  . Cardiac catheterization  10/2009    Cardiac cath normal coronary arteries  . Other surgical history  1983    hysterectomy ovaries intact, prolapsed   . Cervical disc arthroplasty      disc replacement in neck 1998  . Other surgical history  2006    pyloric stricture  surgery  . Colonoscopy      10 year repeat 09/26/2010  . Esophagogastroduodenoscopy  09/26/2010,01/08/13  . Abdominal hysterectomy  1983     Current Outpatient Prescriptions  Medication Sig Dispense Refill  . amLODipine (NORVASC) 2.5 MG tablet Take 2.5 mg by mouth daily.    Marland Kitchen aspirin EC 81 MG tablet Take 81 mg by mouth daily.    Marland Kitchen docusate sodium (COLACE) 100 MG capsule Take 100 mg by mouth 2 (two) times daily.    Marland Kitchen FLUoxetine (PROZAC) 10 MG capsule Take 10 mg by mouth daily.    Marland Kitchen losartan (COZAAR) 100 MG tablet Take 50 mg by mouth 2 (two) times daily.     . pantoprazole (PROTONIX) 40 MG tablet Take 40 mg by mouth 2 (two) times daily.    . sucralfate (CARAFATE) 1 g tablet Take 1 g by mouth 2 (two) times daily.    . traMADol (ULTRAM) 50 MG tablet Take 50 mg by mouth every 6 (six) hours as needed for moderate pain.    Marland Kitchen triamcinolone (NASACORT ALLERGY 24HR) 55 MCG/ACT AERO nasal inhaler Place 2 sprays into the nose daily as needed (for allergies).     No current facility-administered medications for this visit.    Allergies:   Hydrochlorothiazide; Adhesive; Codeine; Penicillins; Sulfa antibiotics; and Vicodin    Social History:  The patient  reports that she has never smoked. She  has never used smokeless tobacco. She reports that she does not drink alcohol or use illicit drugs.   Family History:  The patient's family history includes Cirrhosis in her father; Depression in her sister and sister; Hypertension in her father, mother, and sister; OCD in her sister.    ROS:  Please see the history of present illness.   Otherwise, review of systems are positive for right hip pain, muscle pain, nausea, vomiting, occasional facial swelling.   All other systems are reviewed and negative.    PHYSICAL EXAM: VS:  BP 138/84 mmHg  Pulse 89  Ht 5\' 1"  (1.549 m)  Wt 126 lb 6.4 oz (57.335 kg)  BMI 23.90 kg/m2 , BMI Body mass index is 23.9 kg/(m^2). GEN: Well nourished, well developed, in no acute  distress HEENT: normal Neck: no JVD, carotid bruits, or masses Cardiac: RRR.  There is no murmur, rub, or gallop. There is no edema. Respiratory:  clear to auscultation bilaterally, normal work of breathing. GI: soft, nontender, nondistended, + BS MS: no deformity or atrophy Skin: warm and dry, no rash Neuro:  Strength and sensation are intact Psych: euthymic mood, full affect   EKG:  EKG is ordered today. The ekg reveals normal sinus rhythm, left bundle branch block, left axis deviation, unchanged from prior tracings.   Recent Labs: 05/25/2015: BUN 7; Creatinine, Ser 0.85; Hemoglobin 13.5; Platelets 266; Potassium 3.7; Sodium 133*    Lipid Panel    Component Value Date/Time   CHOL * 11/09/2009 0215    216        ATP III CLASSIFICATION:  <200     mg/dL   Desirable  200-239  mg/dL   Borderline High  >=240    mg/dL   High          TRIG 71 11/09/2009 0215   HDL 69 11/09/2009 0215   CHOLHDL 3.1 11/09/2009 0215   VLDL 14 11/09/2009 0215   LDLCALC * 11/09/2009 0215    133        Total Cholesterol/HDL:CHD Risk Coronary Heart Disease Risk Table                     Men   Women  1/2 Average Risk   3.4   3.3  Average Risk       5.0   4.4  2 X Average Risk   9.6   7.1  3 X Average Risk  23.4   11.0        Use the calculated Patient Ratio above and the CHD Risk Table to determine the patient's CHD Risk.        ATP III CLASSIFICATION (LDL):  <100     mg/dL   Optimal  100-129  mg/dL   Near or Above                    Optimal  130-159  mg/dL   Borderline  160-189  mg/dL   High  >190     mg/dL   Very High      Wt Readings from Last 3 Encounters:  06/03/15 126 lb 6.4 oz (57.335 kg)  05/25/15 127 lb 14.4 oz (58.015 kg)  04/14/13 130 lb (58.968 kg)      Other studies Reviewed: Additional studies/ records that were reviewed today include: Reviewed report of coronary angiogram from 2011.. The findings include the catheterization revealed no evidence of atherosclerosis in her  coronary circulation, normal LV size and pressure, and  EF of 65%..    ASSESSMENT AND PLAN:  1. Pre-operative clearance Cleared for general anesthesia and upcoming Hip Replacement by Dr. Mayer Camel  2. Chest pain, unspecified chest pain type Since 2011 the patient's had no recurrence of chest pain. Coronary angiogram at that time was normal.  3. LBBB (left bundle branch block) Chronically present for greater than 15 years.  4. Right hip replacement surgery pending She is using a 4 pronged walker and looking forward to surgery which will like her to become more physically active.    Current medicines are reviewed at length with the patient today.  The patient has the following concerns regarding medicines: none.  The following changes/actions have been instituted:    [place], by Dr. Mayer Camel  Labs/ tests ordered today include:  No orders of the defined types were placed in this encounter.     Disposition:   FU with HS in PRN    Signed, Sinclair Grooms, MD  06/03/2015 9:20 AM    [address], [place], [place]  [address] Phone: [phone]; Fax: [phone]

## 2015-06-03 NOTE — Patient Instructions (Signed)
Medication Instructions:  Your physician recommends that you continue on your current medications as directed. Please refer to the Current Medication list given to you today.   Labwork: None ordered  Testing/Procedures: None ordered  Follow-Up: Your physician recommends that you schedule a follow-up appointment as needed   Any Other Special Instructions Will Be Listed Below (If Applicable). You are cleared for your upcoming surgery with [facility]    If you need a refill on your cardiac medications before your next appointment, please call your pharmacy.

## 2015-06-04 ENCOUNTER — Ambulatory Visit: Payer: Medicare Other | Admitting: Cardiovascular Disease

## 2015-06-04 MED ORDER — DEXTROSE-NACL 5-0.45 % IV SOLN: INTRAVENOUS | Status: DC

## 2015-06-04 MED ORDER — CHLORHEXIDINE GLUCONATE 4 % EX LIQD: 60.0000 mL | CUTANEOUS | Status: DC

## 2015-06-04 MED ORDER — VANCOMYCIN HCL IN DEXTROSE 1-5 GM/200ML-% IV SOLN
1000.0000 mg | INTRAVENOUS | Status: AC
  Administered 2015-06-07: 1000 mg via INTRAVENOUS
  Filled 2015-06-04: qty 200

## 2015-06-04 NOTE — H&P (Signed)
TOTAL HIP ADMISSION H&P  Patient is admitted for right total hip arthroplasty.  Subjective:  Chief Complaint: right hip pain  HPI: Danielle Garrett, 65 y.o. female, has a history of pain and functional disability in the right hip(s) due to arthritis and patient has failed non-surgical conservative treatments for greater than 12 weeks to include NSAID's and/or analgesics, corticosteriod injections, flexibility and strengthening excercises, use of assistive devices, weight reduction as appropriate and activity modification.  Onset of symptoms was abrupt starting 1 years ago with rapidlly worsening course since that time.The patient noted no past surgery on the right hip(s).  Patient currently rates pain in the right hip at 10 out of 10 with activity. Patient has night pain, worsening of pain with activity and weight bearing, trendelenberg gait, pain that interfers with activities of daily living and pain with passive range of motion. Patient has evidence of joint space narrowing by imaging studies. This condition presents safety issues increasing the risk of falls. There is no current active infection.  Patient Active Problem List   Diagnosis Date Noted  . Pre-operative clearance 06/02/2015  . Chest pain 06/02/2015  . LBBB (left bundle branch block) 06/02/2015  . History of left hip replacement 06/02/2015  . Palpitation 10/07/2014   Past Medical History  Diagnosis Date  . Hypertension   . Gastroparesis   . Anxiety   . Pyloric stenosis   . Childhood asthma   . Chronic hoarseness   . Allergic rhinitis due to other allergen   . PONV (postoperative nausea and vomiting)   . Left bundle branch block      (abnormal heart rhythm)  . Anginal pain ([place]) 2011    hospitalized for CP in the past; due to low potassium  . GERD (gastroesophageal reflux disease)   . History of hiatal hernia   . Arthritis   . Hx of blood transfusion reaction     in the early 80s    Past Surgical History  Procedure  Laterality Date  . Cardiac catheterization  10/2009    Cardiac cath normal coronary arteries  . Other surgical history  1983    hysterectomy ovaries intact, prolapsed   . Cervical disc arthroplasty      disc replacement in neck 1998  . Other surgical history  2006    pyloric stricture surgery  . Colonoscopy      10 year repeat 09/26/2010  . Esophagogastroduodenoscopy  09/26/2010,01/08/13  . Abdominal hysterectomy  1983    No prescriptions prior to admission   Allergies  Allergen Reactions  . Hydrochlorothiazide Nausea Only  . Adhesive [Tape]     Pt states ' it took my skin off'  . Codeine Nausea And Vomiting  . Penicillins Other (See Comments)    Unknown  . Sulfa Antibiotics Nausea And Vomiting  . Vicodin [Hydrocodone-Acetaminophen] Nausea And Vomiting and Other (See Comments)    Did not feel like herself    Social History  Substance Use Topics  . Smoking status: Never Smoker   . Smokeless tobacco: Never Used  . Alcohol Use: No    Family History  Problem Relation Age of Onset  . Hypertension Mother   . Cirrhosis Father   . Hypertension Father   . OCD Sister   . Depression Sister   . Depression Sister   . Hypertension Sister      Review of Systems  Constitutional: Negative.   Eyes:       Glasses  Respiratory:  Asthma  Cardiovascular:       HTN  Gastrointestinal:       Stomach ulcer  Genitourinary: Negative.   Musculoskeletal: Positive for joint pain.  Skin: Negative.   Neurological: Positive for headaches.  Endo/Heme/Allergies: Negative.   Psychiatric/Behavioral: Negative.     Objective:  Physical Exam  Constitutional: She is oriented to person, place, and time. She appears well-developed and well-nourished.  HENT:  Head: Normocephalic and atraumatic.  Eyes: Pupils are equal, round, and reactive to light.  Neck: Normal range of motion. Neck supple.  Cardiovascular: Intact distal pulses.   Respiratory: Effort normal.  Musculoskeletal: She  exhibits tenderness.  she has normal pain with flexion extension internal/external rotation or log roll in the left.  She does have significant pain with internal and external rotation on the right.  Mild groin pain.  He'll tap does cause mild increase in symptoms.  She has internal rotation to approximately 15-20 in Rotation to approximately 20.  Her calves are soft and nontender.  She is neurovascularly intact distally.   Neurological: She is alert and oriented to person, place, and time.  Skin: Skin is warm and dry.  Psychiatric: She has a normal mood and affect. Her behavior is normal. Judgment and thought content normal.    Vital signs in last 24 hours:    Labs:   There is no height on file to calculate BMI.   Imaging Review Plain radiographs demonstrate moderate osteoarthritis of the right hip.  Assessment/Plan:  End stage arthritis, right hip(s)  The patient history, physical examination, clinical judgement of the provider and imaging studies are consistent with end stage degenerative joint disease of the right hip(s) and total hip arthroplasty is deemed medically necessary. The treatment options including medical management, injection therapy, arthroscopy and arthroplasty were discussed at length. The risks and benefits of total hip arthroplasty were presented and reviewed. The risks due to aseptic loosening, infection, stiffness, dislocation/subluxation,  thromboembolic complications and other imponderables were discussed.  The patient acknowledged the explanation, agreed to proceed with the plan and consent was signed. Patient is being admitted for inpatient treatment for surgery, pain control, PT, OT, prophylactic antibiotics, VTE prophylaxis, progressive ambulation and ADL's and discharge planning.The patient is planning to be discharged home with home health services

## 2015-06-05 DIAGNOSIS — M1611 Unilateral primary osteoarthritis, right hip: Secondary | ICD-10-CM | POA: Diagnosis present

## 2015-06-07 ENCOUNTER — Encounter (HOSPITAL_COMMUNITY): Payer: Self-pay | Admitting: *Deleted

## 2015-06-07 ENCOUNTER — Inpatient Hospital Stay (HOSPITAL_COMMUNITY): Payer: Medicare Other | Admitting: Vascular Surgery

## 2015-06-07 ENCOUNTER — Inpatient Hospital Stay (HOSPITAL_COMMUNITY): Payer: Medicare Other | Admitting: Emergency Medicine

## 2015-06-07 ENCOUNTER — Encounter (HOSPITAL_COMMUNITY)
Admission: RE | Disposition: A | Discharge status: Home Health | Payer: Self-pay | Source: Ambulatory Visit | Attending: Orthopedic Surgery

## 2015-06-07 ENCOUNTER — Inpatient Hospital Stay (HOSPITAL_COMMUNITY)
Admission: RE | Admit: 2015-06-07 | Discharge: 2015-06-10 | DRG: 470 | Disposition: A | Discharge status: Home Health | Payer: Medicare Other | Source: Ambulatory Visit | Attending: Orthopedic Surgery | Admitting: Orthopedic Surgery

## 2015-06-07 ENCOUNTER — Inpatient Hospital Stay (HOSPITAL_COMMUNITY): Payer: Medicare Other

## 2015-06-07 DIAGNOSIS — Z96649 Presence of unspecified artificial hip joint: Secondary | ICD-10-CM

## 2015-06-07 DIAGNOSIS — M169 Osteoarthritis of hip, unspecified: Secondary | ICD-10-CM | POA: Diagnosis not present

## 2015-06-07 DIAGNOSIS — K3184 Gastroparesis: Secondary | ICD-10-CM | POA: Diagnosis present

## 2015-06-07 DIAGNOSIS — M1611 Unilateral primary osteoarthritis, right hip: Secondary | ICD-10-CM | POA: Diagnosis present

## 2015-06-07 DIAGNOSIS — K219 Gastro-esophageal reflux disease without esophagitis: Secondary | ICD-10-CM | POA: Diagnosis present

## 2015-06-07 DIAGNOSIS — I447 Left bundle-branch block, unspecified: Secondary | ICD-10-CM | POA: Diagnosis present

## 2015-06-07 DIAGNOSIS — F419 Anxiety disorder, unspecified: Secondary | ICD-10-CM | POA: Diagnosis present

## 2015-06-07 DIAGNOSIS — I10 Essential (primary) hypertension: Secondary | ICD-10-CM | POA: Diagnosis present

## 2015-06-07 DIAGNOSIS — Z96642 Presence of left artificial hip joint: Secondary | ICD-10-CM | POA: Diagnosis present

## 2015-06-07 DIAGNOSIS — M25551 Pain in right hip: Secondary | ICD-10-CM | POA: Diagnosis present

## 2015-06-07 DIAGNOSIS — Z419 Encounter for procedure for purposes other than remedying health state, unspecified: Secondary | ICD-10-CM

## 2015-06-07 DIAGNOSIS — Z471 Aftercare following joint replacement surgery: Secondary | ICD-10-CM | POA: Diagnosis not present

## 2015-06-07 DIAGNOSIS — Z96641 Presence of right artificial hip joint: Secondary | ICD-10-CM | POA: Diagnosis not present

## 2015-06-07 HISTORY — PX: TOTAL HIP ARTHROPLASTY: SHX124

## 2015-06-07 LAB — GLUCOSE, CAPILLARY
Glucose-Capillary: 64 mg/dL — ABNORMAL LOW (ref 65–99)
Glucose-Capillary: 70 mg/dL (ref 65–99)
Glucose-Capillary: 83 mg/dL (ref 65–99)

## 2015-06-07 IMAGING — RF DG HIP (WITH PELVIS) OPERATIVE*R*
1 series · 2 of 2 positions shown · non-contrast
Comparison: [DATE]

CLINICAL DATA: Operative imaging following right total hip
arthroplasty.

EXAM:
OPERATIVE RIGHT HIP (WITH PELVIS IF PERFORMED) 2 VIEWS
TECHNIQUE: Fluoroscopic spot image(s) were submitted for interpretation
post-operatively.

[Series 1: run · 2 of 2 slices shown]
[im 1/2]
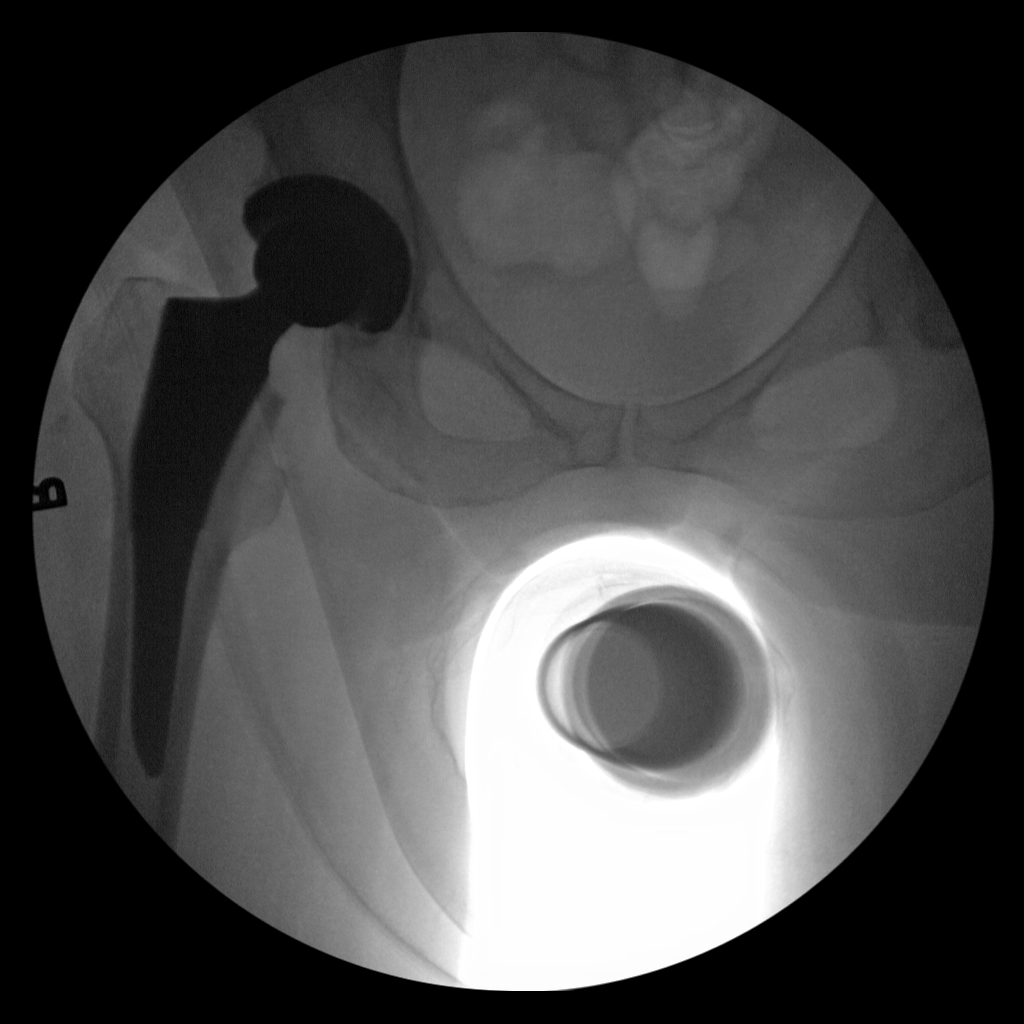
[im 2/2]
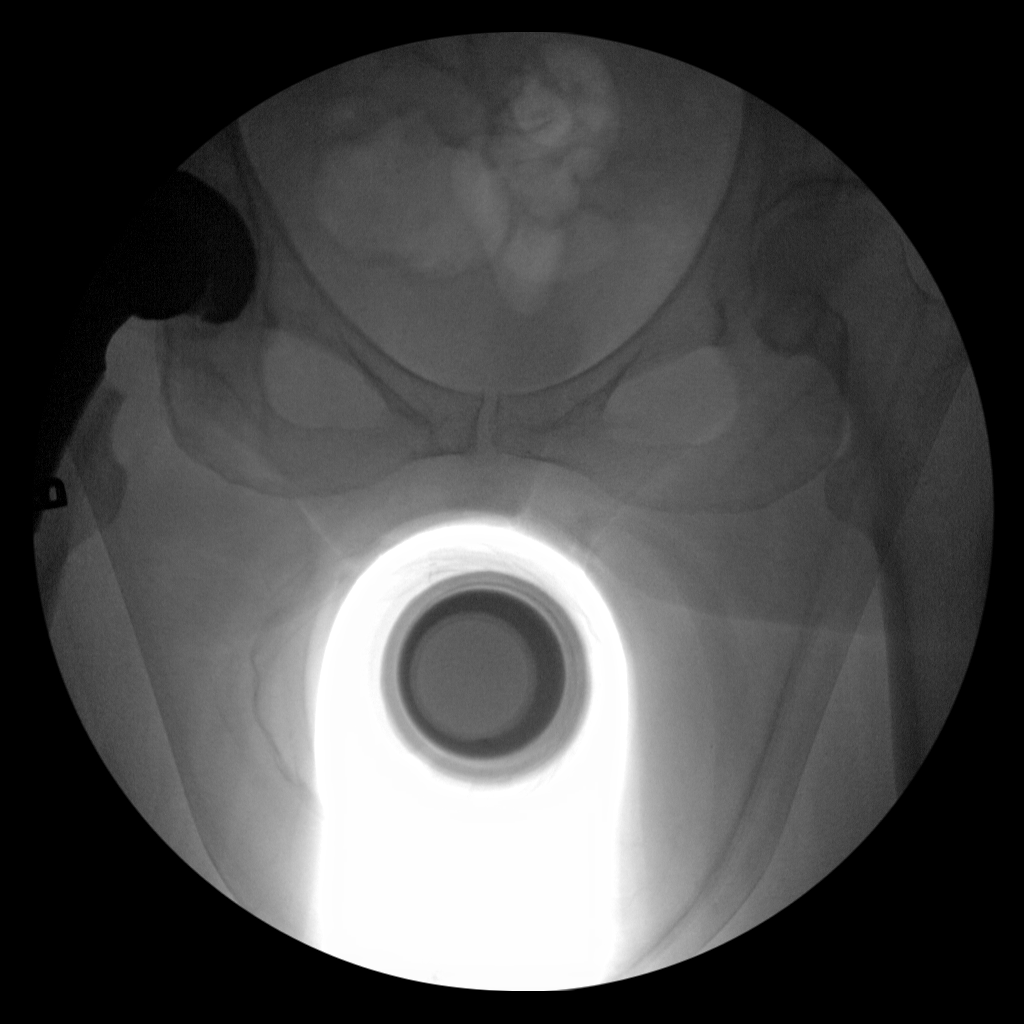

[2 of 2 positions shown; findings below may reference images not displayed]

FINDINGS: On the submitted views, the total right hip arthroplasty appears
well seated and aligned. There is no acute fracture or evidence of
an operative complication.
IMPRESSION: Well-aligned total right hip arthroplasty.

## 2015-06-07 IMAGING — CR DG PORTABLE PELVIS
1 series · 1 of 1 positions shown · non-contrast
Comparison: Pelvis CT dated [DATE].

CLINICAL DATA: Status post right hip arthroplasty.

EXAM:
PORTABLE PELVIS 1-2 VIEWS

[AP]
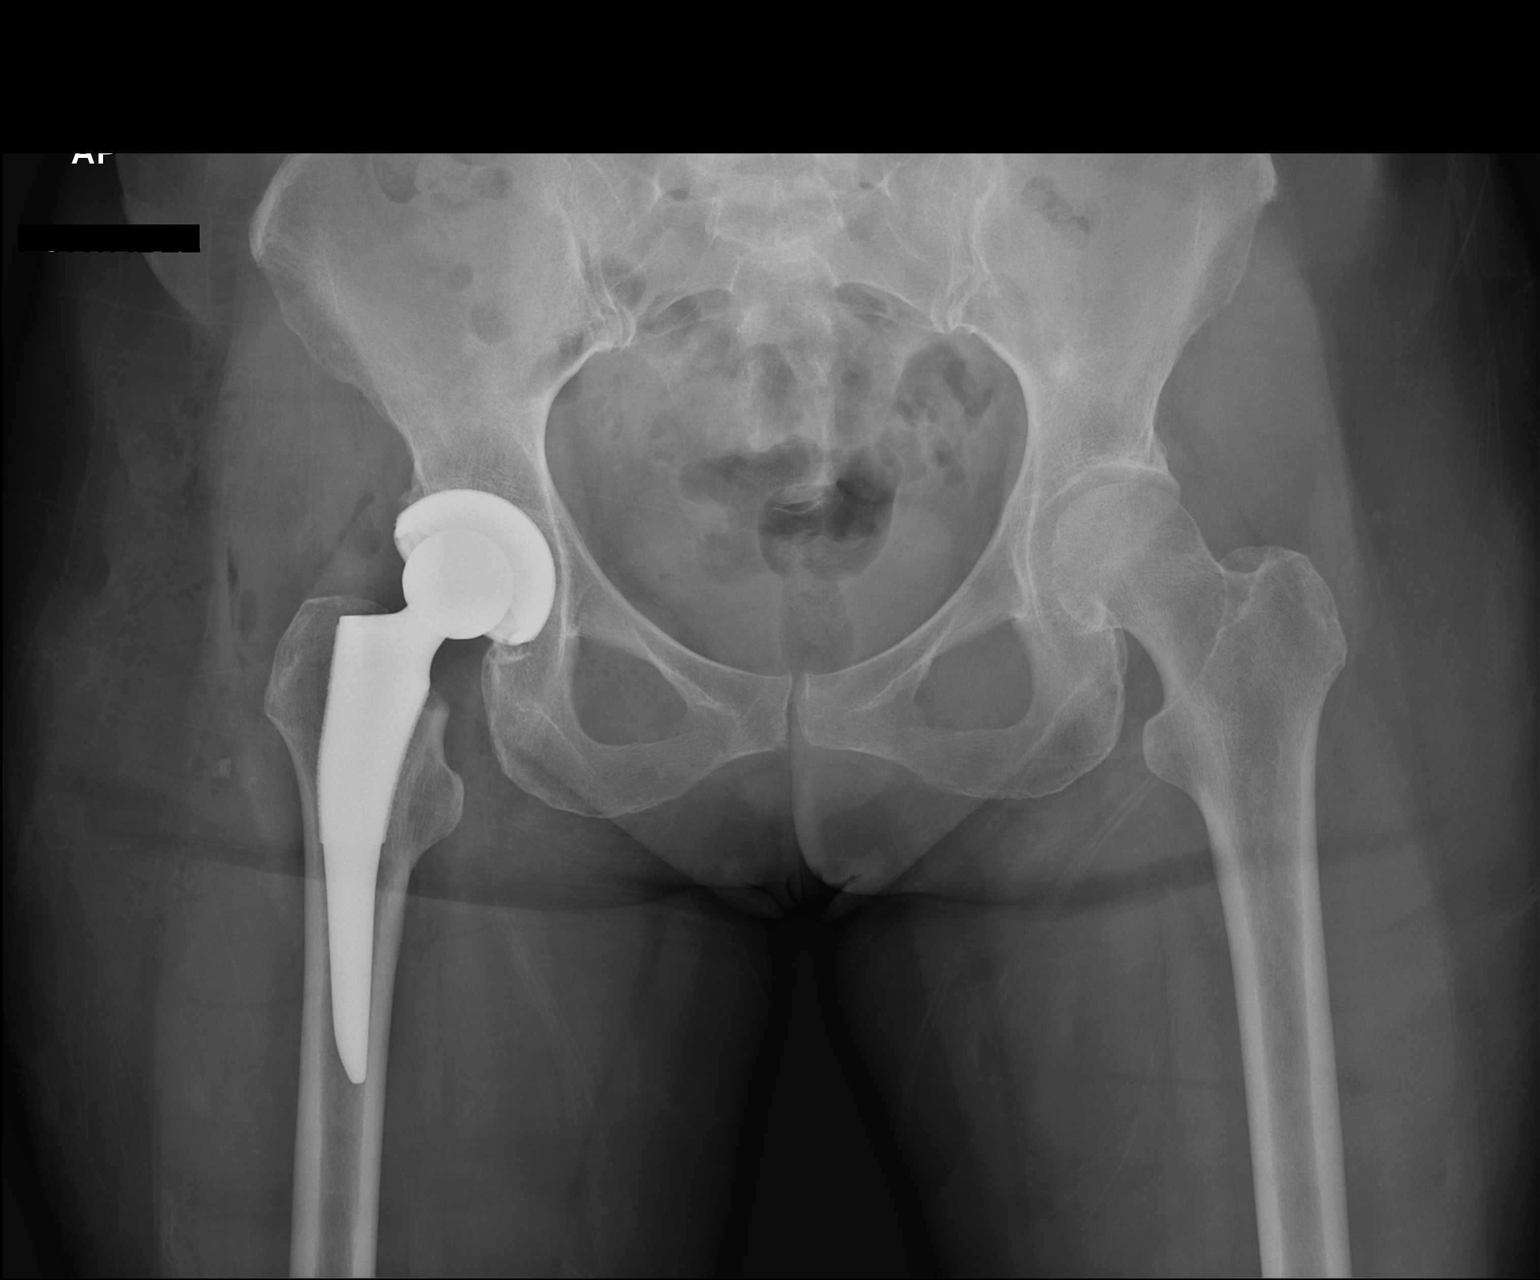

[1 of 1 positions shown; findings below may reference images not displayed]

FINDINGS: Interval right hip bipolar prosthesis in satisfactory position and
alignment. No fracture or dislocation seen.
IMPRESSION: Satisfactory postoperative appearance of a right hip bipolar
prosthesis.

## 2015-06-07 SURGERY — ARTHROPLASTY, HIP, TOTAL, ANTERIOR APPROACH
Anesthesia: Spinal | Site: Hip | Laterality: Right

## 2015-06-07 MED ORDER — ONDANSETRON HCL 4 MG/2ML IJ SOLN
4.0000 mg | Freq: Four times a day (QID) | INTRAMUSCULAR | Status: DC | PRN
  Administered 2015-06-07 – 2015-06-08 (×2): 4 mg via INTRAVENOUS
  Filled 2015-06-07 (×2): qty 2

## 2015-06-07 MED ORDER — MENTHOL 3 MG MT LOZG: 1.0000 | LOZENGE | OROMUCOSAL | Status: DC | PRN

## 2015-06-07 MED ORDER — SUCRALFATE 1 G PO TABS
1.0000 g | ORAL_TABLET | Freq: Two times a day (BID) | ORAL | Status: DC
  Administered 2015-06-07 – 2015-06-10 (×6): 1 g via ORAL
  Filled 2015-06-07 (×6): qty 1

## 2015-06-07 MED ORDER — METHOCARBAMOL 1000 MG/10ML IJ SOLN
500.0000 mg | Freq: Four times a day (QID) | INTRAMUSCULAR | Status: DC | PRN
  Filled 2015-06-07: qty 5

## 2015-06-07 MED ORDER — PROMETHAZINE HCL 25 MG/ML IJ SOLN: 6.2500 mg | INTRAMUSCULAR | Status: DC | PRN

## 2015-06-07 MED ORDER — TRANEXAMIC ACID 1000 MG/10ML IV SOLN
1000.0000 mg | Freq: Once | INTRAVENOUS | Status: AC
  Administered 2015-06-07: 1000 mg via INTRAVENOUS
  Filled 2015-06-07: qty 10

## 2015-06-07 MED ORDER — LACTATED RINGERS IV SOLN: INTRAVENOUS | Status: DC

## 2015-06-07 MED ORDER — DEXTROSE 5 % IV SOLN
10.0000 mg | INTRAVENOUS | Status: DC | PRN
  Administered 2015-06-07: 15 ug/min via INTRAVENOUS

## 2015-06-07 MED ORDER — TRAMADOL HCL 50 MG PO TABS
50.0000 mg | ORAL_TABLET | Freq: Four times a day (QID) | ORAL | Status: DC | PRN
  Administered 2015-06-08: 50 mg via ORAL
  Filled 2015-06-07: qty 1

## 2015-06-07 MED ORDER — ONDANSETRON HCL 4 MG/2ML IJ SOLN
INTRAMUSCULAR | Status: AC
  Filled 2015-06-07: qty 2

## 2015-06-07 MED ORDER — FLUOXETINE HCL 10 MG PO CAPS
10.0000 mg | ORAL_CAPSULE | Freq: Every day | ORAL | Status: DC
  Administered 2015-06-08 – 2015-06-10 (×3): 10 mg via ORAL
  Filled 2015-06-07 (×3): qty 1

## 2015-06-07 MED ORDER — DIPHENHYDRAMINE HCL 12.5 MG/5ML PO ELIX: 12.5000 mg | ORAL_SOLUTION | ORAL | Status: DC | PRN

## 2015-06-07 MED ORDER — METOCLOPRAMIDE HCL 5 MG PO TABS: 5.0000 mg | ORAL_TABLET | Freq: Three times a day (TID) | ORAL | Status: DC | PRN

## 2015-06-07 MED ORDER — ALUMINUM HYDROXIDE GEL 320 MG/5ML PO SUSP
15.0000 mL | ORAL | Status: DC | PRN
  Filled 2015-06-07: qty 30

## 2015-06-07 MED ORDER — MEPERIDINE HCL 25 MG/ML IJ SOLN: 6.2500 mg | INTRAMUSCULAR | Status: DC | PRN

## 2015-06-07 MED ORDER — LOSARTAN POTASSIUM 50 MG PO TABS
50.0000 mg | ORAL_TABLET | Freq: Two times a day (BID) | ORAL | Status: DC
  Administered 2015-06-07 – 2015-06-10 (×6): 50 mg via ORAL
  Filled 2015-06-07 (×6): qty 1

## 2015-06-07 MED ORDER — METOCLOPRAMIDE HCL 5 MG/ML IJ SOLN
5.0000 mg | Freq: Three times a day (TID) | INTRAMUSCULAR | Status: DC | PRN
  Administered 2015-06-07: 10 mg via INTRAVENOUS
  Filled 2015-06-07: qty 2

## 2015-06-07 MED ORDER — MIDAZOLAM HCL 5 MG/5ML IJ SOLN
INTRAMUSCULAR | Status: DC | PRN
  Administered 2015-06-07 (×2): 1 mg via INTRAVENOUS

## 2015-06-07 MED ORDER — HYDROMORPHONE HCL 1 MG/ML IJ SOLN
INTRAMUSCULAR | Status: AC
  Filled 2015-06-07: qty 1

## 2015-06-07 MED ORDER — METHOCARBAMOL 500 MG PO TABS
500.0000 mg | ORAL_TABLET | Freq: Four times a day (QID) | ORAL | Status: DC | PRN
  Administered 2015-06-09 – 2015-06-10 (×4): 500 mg via ORAL
  Filled 2015-06-07 (×4): qty 1

## 2015-06-07 MED ORDER — ASPIRIN EC 325 MG PO TBEC
325.0000 mg | DELAYED_RELEASE_TABLET | Freq: Every day | ORAL | Status: DC
  Administered 2015-06-08 – 2015-06-10 (×3): 325 mg via ORAL
  Filled 2015-06-07 (×3): qty 1

## 2015-06-07 MED ORDER — AMLODIPINE BESYLATE 2.5 MG PO TABS
2.5000 mg | ORAL_TABLET | Freq: Every day | ORAL | Status: DC
  Administered 2015-06-08 – 2015-06-10 (×3): 2.5 mg via ORAL
  Filled 2015-06-07 (×3): qty 1

## 2015-06-07 MED ORDER — ACETAMINOPHEN 650 MG RE SUPP: 650.0000 mg | Freq: Four times a day (QID) | RECTAL | Status: DC | PRN

## 2015-06-07 MED ORDER — LACTATED RINGERS IV SOLN
INTRAVENOUS | Status: DC
  Administered 2015-06-07 (×3): via INTRAVENOUS

## 2015-06-07 MED ORDER — PHENOL 1.4 % MT LIQD: 1.0000 | OROMUCOSAL | Status: DC | PRN

## 2015-06-07 MED ORDER — FENTANYL CITRATE (PF) 100 MCG/2ML IJ SOLN
INTRAMUSCULAR | Status: DC | PRN
Start: 2015-06-07 — End: 2015-06-07
  Administered 2015-06-07: 50 ug via INTRAVENOUS

## 2015-06-07 MED ORDER — LIDOCAINE HCL (CARDIAC) 20 MG/ML IV SOLN
INTRAVENOUS | Status: AC
  Filled 2015-06-07: qty 5

## 2015-06-07 MED ORDER — TRANEXAMIC ACID 1000 MG/10ML IV SOLN
2000.0000 mg | INTRAVENOUS | Status: DC | PRN
  Administered 2015-06-07: 2000 mg via TOPICAL

## 2015-06-07 MED ORDER — TRIAMCINOLONE ACETONIDE 55 MCG/ACT NA AERO
2.0000 | INHALATION_SPRAY | Freq: Every day | NASAL | Status: DC | PRN
  Filled 2015-06-07: qty 21.6

## 2015-06-07 MED ORDER — ONDANSETRON HCL 4 MG PO TABS: 4.0000 mg | ORAL_TABLET | Freq: Four times a day (QID) | ORAL | Status: DC | PRN

## 2015-06-07 MED ORDER — PROPOFOL 10 MG/ML IV BOLUS
INTRAVENOUS | Status: DC | PRN
  Administered 2015-06-07: 20 mg via INTRAVENOUS

## 2015-06-07 MED ORDER — PHENYLEPHRINE HCL 10 MG/ML IJ SOLN
INTRAMUSCULAR | Status: DC | PRN
  Administered 2015-06-07: 80 ug via INTRAVENOUS
  Administered 2015-06-07 (×3): 40 ug via INTRAVENOUS
  Administered 2015-06-07: 80 ug via INTRAVENOUS

## 2015-06-07 MED ORDER — KCL IN DEXTROSE-NACL 20-5-0.45 MEQ/L-%-% IV SOLN
INTRAVENOUS | Status: DC
  Administered 2015-06-07 – 2015-06-08 (×3): via INTRAVENOUS
  Filled 2015-06-07 (×4): qty 1000

## 2015-06-07 MED ORDER — PROPOFOL 500 MG/50ML IV EMUL
INTRAVENOUS | Status: DC | PRN
  Administered 2015-06-07: 75 ug/kg/min via INTRAVENOUS

## 2015-06-07 MED ORDER — HYDROMORPHONE HCL 1 MG/ML IJ SOLN
0.2500 mg | INTRAMUSCULAR | Status: DC | PRN
  Administered 2015-06-07 (×4): 0.5 mg via INTRAVENOUS

## 2015-06-07 MED ORDER — FENTANYL CITRATE (PF) 250 MCG/5ML IJ SOLN
INTRAMUSCULAR | Status: AC
  Filled 2015-06-07: qty 5

## 2015-06-07 MED ORDER — HYDROMORPHONE HCL 1 MG/ML IJ SOLN
INTRAMUSCULAR | Status: AC
  Administered 2015-06-07: 0.5 mg via INTRAVENOUS
  Filled 2015-06-07: qty 1

## 2015-06-07 MED ORDER — DEXAMETHASONE SODIUM PHOSPHATE 10 MG/ML IJ SOLN
10.0000 mg | Freq: Once | INTRAMUSCULAR | Status: AC
  Administered 2015-06-08: 10 mg via INTRAVENOUS
  Filled 2015-06-07: qty 1

## 2015-06-07 MED ORDER — ONDANSETRON HCL 4 MG/2ML IJ SOLN
INTRAMUSCULAR | Status: DC | PRN
  Administered 2015-06-07: 4 mg via INTRAVENOUS

## 2015-06-07 MED ORDER — TRANEXAMIC ACID 1000 MG/10ML IV SOLN
1000.0000 mg | INTRAVENOUS | Status: AC
  Administered 2015-06-07: 1000 mg via INTRAVENOUS
  Filled 2015-06-07: qty 10

## 2015-06-07 MED ORDER — ACETAMINOPHEN 325 MG PO TABS
650.0000 mg | ORAL_TABLET | Freq: Four times a day (QID) | ORAL | Status: DC | PRN
  Administered 2015-06-09: 650 mg via ORAL
  Filled 2015-06-07: qty 2

## 2015-06-07 MED ORDER — HYDROMORPHONE HCL 1 MG/ML IJ SOLN
0.5000 mg | INTRAMUSCULAR | Status: DC | PRN
  Administered 2015-06-07 – 2015-06-09 (×8): 1 mg via INTRAVENOUS
  Filled 2015-06-07 (×8): qty 1

## 2015-06-07 MED ORDER — MIDAZOLAM HCL 2 MG/2ML IJ SOLN
INTRAMUSCULAR | Status: AC
  Filled 2015-06-07: qty 2

## 2015-06-07 MED ORDER — TRANEXAMIC ACID 1000 MG/10ML IV SOLN
2000.0000 mg | Freq: Once | INTRAVENOUS | Status: DC
  Filled 2015-06-07: qty 20

## 2015-06-07 MED ORDER — DOCUSATE SODIUM 100 MG PO CAPS
100.0000 mg | ORAL_CAPSULE | Freq: Two times a day (BID) | ORAL | Status: DC
  Administered 2015-06-07 – 2015-06-10 (×6): 100 mg via ORAL
  Filled 2015-06-07 (×6): qty 1

## 2015-06-07 MED ORDER — 0.9 % SODIUM CHLORIDE (POUR BTL) OPTIME
TOPICAL | Status: DC | PRN
  Administered 2015-06-07: 1000 mL

## 2015-06-07 MED ORDER — OXYCODONE HCL 5 MG PO TABS
5.0000 mg | ORAL_TABLET | ORAL | Status: DC | PRN
  Administered 2015-06-07 – 2015-06-10 (×12): 10 mg via ORAL
  Filled 2015-06-07 (×12): qty 2

## 2015-06-07 MED ORDER — PANTOPRAZOLE SODIUM 40 MG PO TBEC
40.0000 mg | DELAYED_RELEASE_TABLET | Freq: Two times a day (BID) | ORAL | Status: DC
  Administered 2015-06-07 – 2015-06-10 (×6): 40 mg via ORAL
  Filled 2015-06-07 (×6): qty 1

## 2015-06-07 MED ORDER — LIDOCAINE HCL (CARDIAC) 20 MG/ML IV SOLN
INTRAVENOUS | Status: DC | PRN
  Administered 2015-06-07: 40 mg via INTRAVENOUS

## 2015-06-07 SURGICAL SUPPLY — 45 items
BLADE SURG ROTATE 9660 (MISCELLANEOUS) IMPLANT
CAPT HIP TOTAL 2 ×2 IMPLANT
COVER PERINEAL POST (MISCELLANEOUS) ×2 IMPLANT
COVER SURGICAL LIGHT HANDLE (MISCELLANEOUS) ×2 IMPLANT
DRAPE C-ARM 42X72 X-RAY (DRAPES) ×2 IMPLANT
DRAPE STERI IOBAN 125X83 (DRAPES) ×2 IMPLANT
DRAPE U-SHAPE 47X51 STRL (DRAPES) ×4 IMPLANT
DRSG AQUACEL AG ADV 3.5X10 (GAUZE/BANDAGES/DRESSINGS) ×2 IMPLANT
DURAPREP 26ML APPLICATOR (WOUND CARE) ×2 IMPLANT
ELECT BLADE 4.0 EZ CLEAN MEGAD (MISCELLANEOUS) ×2
ELECT REM PT RETURN 9FT ADLT (ELECTROSURGICAL) ×2
ELECTRODE BLDE 4.0 EZ CLN MEGD (MISCELLANEOUS) ×1 IMPLANT
ELECTRODE REM PT RTRN 9FT ADLT (ELECTROSURGICAL) ×1 IMPLANT
FACESHIELD WRAPAROUND (MASK) ×4 IMPLANT
GLOVE BIO SURGEON STRL SZ7.5 (GLOVE) ×2 IMPLANT
GLOVE BIO SURGEON STRL SZ8.5 (GLOVE) ×4 IMPLANT
GLOVE BIOGEL PI IND STRL 8 (GLOVE) ×2 IMPLANT
GLOVE BIOGEL PI IND STRL 9 (GLOVE) ×1 IMPLANT
GLOVE BIOGEL PI INDICATOR 8 (GLOVE) ×2
GLOVE BIOGEL PI INDICATOR 9 (GLOVE) ×1
GOWN STRL REUS W/ TWL LRG LVL3 (GOWN DISPOSABLE) ×1 IMPLANT
GOWN STRL REUS W/ TWL XL LVL3 (GOWN DISPOSABLE) ×2 IMPLANT
GOWN STRL REUS W/TWL LRG LVL3 (GOWN DISPOSABLE) ×1
GOWN STRL REUS W/TWL XL LVL3 (GOWN DISPOSABLE) ×2
KIT BASIN OR (CUSTOM PROCEDURE TRAY) ×2 IMPLANT
KIT ROOM TURNOVER OR (KITS) ×2 IMPLANT
MANIFOLD NEPTUNE II (INSTRUMENTS) ×2 IMPLANT
NS IRRIG 1000ML POUR BTL (IV SOLUTION) ×2 IMPLANT
PACK TOTAL JOINT (CUSTOM PROCEDURE TRAY) ×2 IMPLANT
PACK UNIVERSAL I (CUSTOM PROCEDURE TRAY) ×2 IMPLANT
PAD ARMBOARD 7.5X6 YLW CONV (MISCELLANEOUS) ×4 IMPLANT
SAW OSC TIP CART 19.5X105X1.3 (SAW) ×2 IMPLANT
SUT ETHIBOND NAB CT1 #1 30IN (SUTURE) ×4 IMPLANT
SUT VIC AB 0 CT1 27 (SUTURE) ×1
SUT VIC AB 0 CT1 27XBRD ANBCTR (SUTURE) ×1 IMPLANT
SUT VIC AB 1 CT1 27 (SUTURE) ×1
SUT VIC AB 1 CT1 27XBRD ANBCTR (SUTURE) ×1 IMPLANT
SUT VIC AB 2-0 CT1 27 (SUTURE) ×1
SUT VIC AB 2-0 CT1 TAPERPNT 27 (SUTURE) ×1 IMPLANT
SUT VIC AB 3-0 PS2 18 (SUTURE) ×1
SUT VIC AB 3-0 PS2 18XBRD (SUTURE) ×1 IMPLANT
TOWEL OR 17X24 6PK STRL BLUE (TOWEL DISPOSABLE) ×2 IMPLANT
TOWEL OR 17X26 10 PK STRL BLUE (TOWEL DISPOSABLE) ×2 IMPLANT
TRAY FOLEY CATH 14FR (SET/KITS/TRAYS/PACK) IMPLANT
WATER STERILE IRR 1000ML POUR (IV SOLUTION) ×4 IMPLANT

## 2015-06-07 NOTE — Progress Notes (Signed)
She states she has had bad experience with IV's, and needs [place] to start the IV in her [facility].

## 2015-06-07 NOTE — Anesthesia Postprocedure Evaluation (Signed)
Anesthesia Post Note  Patient: Danielle Garrett  Procedure(s) Performed: Procedure(s) (LRB): TOTAL HIP ARTHROPLASTY ANTERIOR APPROACH (Right)  Patient location during evaluation: PACU Anesthesia Type: Spinal Level of consciousness: awake and alert and oriented Pain management: pain level controlled Vital Signs Assessment: post-procedure vital signs reviewed and stable Respiratory status: spontaneous breathing, nonlabored ventilation and respiratory function stable Cardiovascular status: blood pressure returned to baseline and stable Postop Assessment: no headache, no backache, spinal receding, patient able to bend at knees and no signs of nausea or vomiting Anesthetic complications: no    Last Vitals:  Filed Vitals:   06/07/15 1611 06/07/15 1625  BP: 111/70 100/80  Pulse: 81 78  Temp: 36.2 C   Resp: 12 12    Last Pain:  Filed Vitals:   06/07/15 1633  PainSc: 0-No pain                 Charlisa Cham A.

## 2015-06-07 NOTE — Interval H&P Note (Signed)
History and Physical Interval Note:  06/07/2015 10:08 AM  Danielle Garrett  has presented today for surgery, with the diagnosis of RIGHT HIP OSTEOARTHRITIS  The various methods of treatment have been discussed with the patient and family. After consideration of risks, benefits and other options for treatment, the patient has consented to  Procedure(s): TOTAL HIP ARTHROPLASTY ANTERIOR APPROACH (Right) as a surgical intervention .  The patient's history has been reviewed, patient examined, no change in status, stable for surgery.  I have reviewed the patient's chart and labs.  Questions were answered to the patient's satisfaction.     Kerin Salen

## 2015-06-07 NOTE — Progress Notes (Signed)
SPD called for an IV pump.

## 2015-06-07 NOTE — Anesthesia Procedure Notes (Addendum)
Spinal Patient location during procedure: OR Start time: 06/07/2015 7:27 AM End time: 06/07/2015 7:30 AM Staffing Anesthesiologist: Suella Broad D Performed by: anesthesiologist  Preanesthetic Checklist Completed: patient identified, site marked, surgical consent, pre-op evaluation, timeout performed, IV checked, risks and benefits discussed and monitors and equipment checked Spinal Block Patient position: sitting Prep: Betadine Patient monitoring: heart rate, continuous pulse ox, blood pressure and cardiac monitor Approach: midline Location: L4-5 Injection technique: single-shot Needle Needle type: Whitacre and Introducer  Needle gauge: 24 G Needle length: 9 cm Additional Notes Negative paresthesia. Negative blood return. Positive free-flowing CSF. Expiration date of kit checked and confirmed. Patient tolerated procedure well, without complications.    Procedure Name: MAC Date/Time: 06/07/2015 12:14 PM Performed by: Jenne Campus Pre-anesthesia Checklist: Patient identified, Emergency Drugs available, Suction available, Patient being monitored and Timeout performed Patient Re-evaluated:Patient Re-evaluated prior to inductionOxygen Delivery Method: Simple face mask

## 2015-06-07 NOTE — Progress Notes (Signed)
Utilization review completed.  

## 2015-06-07 NOTE — Op Note (Signed)
OPERATIVE REPORT    DATE OF PROCEDURE:  06/07/2015       PREOPERATIVE DIAGNOSIS:  RIGHT HIP OSTEOARTHRITIS                                                          POSTOPERATIVE DIAGNOSIS:  RIGHT HIP OSTEOARTHRITIS                                                           PROCEDURE: Anterior R total hip arthroplasty using a 46 mm DePuy Pinnacle  Cup, [employer], 0-degree polyethylene liner, a +1.5 1mm ceramic head, a 2 Depuy Triloc stem   SURGEON: Tallie Hevia J    ASSISTANT:   Eric K[employer]  (present throughout entire procedure and necessary for timely completion of the procedure)   ANESTHESIA: spinal BLOOD LOSS: 300 FLUID REPLACEMENT: 1500 crystalloid Antibiotic: 2gm ancef Tranexamic Acid: 1gm iv 2gm topical COMPLICATIONS: none    INDICATIONS FOR PROCEDURE: A 65 y.o. year-old With  RIGHT HIP OSTEOARTHRITIS   for 2 years, x-rays show bone-on-bone arthritic changes, and osteophytes. Despite conservative measures with observation, anti-inflammatory medicine, narcotics, use of a cane, has severe unremitting pain and can ambulate only a few blocks before resting. Patient desires elective R total hip arthroplasty to decrease pain and increase function. The risks, benefits, and alternatives were discussed at length including but not limited to the risks of infection, bleeding, nerve injury, stiffness, blood clots, the need for revision surgery, cardiopulmonary complications, among others, and they were willing to proceed. Questions answered     PROCEDURE IN DETAIL: The patient was identified by armband,  received preoperative IV antibiotics in the holding area at [facility], taken to the operating room , appropriate anesthetic monitors  were attached and  anesthesia was induced with the patienton the gurney. The HANA boots were applied to the feet and he was then transferred to the HANA table with a peroneal post and support underneath the non-operative le,  which was locked in 5 lb traction. Theoperative lower extremity was then prepped and draped in the usual sterile fashion from just above the iliac crest to the knee. And a timeout procedure was performed. We then made a 10 cm incision along the interval at the leading edge of the tensor fascia lata of starting at 2 cm lateral to and 2 cm distal to the ASIS. Small bleeders in the skin and subcutaneous tissue identified and cauterized we dissected down to the fascia and made an incision in the fascia allowing [place] to elevate the fascia of the tensor muscle and exploited the interval between the rectus and the tensor fascia lata. A Hohmann retractor was then placed along the superior neck of the femur and a Cobra retractor along the inferior neck of the femur we teed the capsule starting out at the superior anterior aspect of the acetabulum going distally and made the T along the neck both leaflets of the T were tagged with #2 Ethibond suture. Cobra retractors were then placed along the inferior and superior neck allowing [place] to perform a standard neck cut and removed the  femoral head with a power corkscrew. We then placed a right angle Hohmann retractor along the anterior aspect of the acetabulum a spiked Cobra in the cotyloid notch and posteriorly a Muelller retractor. We then sequentially reamed up to a 45 mm basket reamer obtaining good coverage in all quadrants, verified by C-arm imaging. Under C-arm control with and hammered into place a 46 mm Pinnacle cup in 45 of abduction and 15 of anteversion. The cup seated nicely and required no supplemental screws. We then placed a central hole Eliminator and a 0 polyethylene liner. The foot was then externally rotated to 100, the HANA elevator was placed around the flare of the greater trochanter and the limb was extended and abducted delivering the proximal femur up into the wound. A medium Hohmann retractor was placed over the greater trochanter and a Mueller retractor  along the posterior femoral neck completing the exposure. We then performed releases superiorly and and inferiorly of the capsule going back to the pirformis fossa superiorly and to the lesser trochanter inferiorly. We then entered the proximal femur with the box cutting offset chisel followed by, a canal sounder, the chili pepper and broaching up to a 2 broach. This seated nicely and we reamed the calcar. A trial reduction was performed with a 1.5 mm 28 mm head.The limb lengths were excellent the hip was stable in 90 of external rotation. At this point the trial components removed and we hammered into place a # 2 Tri-Lock stem with Gryption coating. This was a lo offset stem and a + 1.5 28 mm ceramic ball was then hammered into place the hip was reduced and final C-arm images obtained. The wound was thoroughly irrigated with normal saline solution. We repaired the ant capsule and the tensor fascia lot a with running 0 vicryl suture. the subcutaneous tissue was closed with 2-0 and 3-0 Vicryl suture followed by an Aquacil dressing. At this point the patient was awaken and transferred to hospital gurney without difficulty. The subcutaneous tissue with 0 and 2-0 undyed Vicryl suture and the skin with running  3-0 vicryl subcuticular suture. Aquacil dressing was applied. The patient was then unclamped, rolled supine, awaken extubated and taken to recovery room without difficulty in stable condition.   Frederik Pear J 06/07/2015, 2:02 PM

## 2015-06-07 NOTE — Progress Notes (Signed)
Pt's husband at bedside to visit and notified me that pt gets low blood sugar and passes out. CBG done with result of 64. Pt awake and asymptomatic at this time. Pt has requested coffee with sugar. Dr.Germeroth notified. States he is okay with pt having oral (coffee w/ sugar). No further orders. Will cont to monitor.

## 2015-06-07 NOTE — Anesthesia Preprocedure Evaluation (Addendum)
Anesthesia Evaluation  Patient identified by MRN, date of birth, ID band Patient awake    Reviewed: Allergy & Precautions, NPO status , Patient's Chart, lab work & pertinent test results  History of Anesthesia Complications (+) PONV and history of anesthetic complications  Airway Mallampati: II  TM Distance: >3 FB Neck ROM: Full    Dental  (+) Teeth Intact   Pulmonary asthma ,    breath sounds clear to auscultation       Cardiovascular hypertension, Pt. on medications + angina  Rhythm:Regular Rate:Normal     Neuro/Psych PSYCHIATRIC DISORDERS Anxiety negative neurological ROS     GI/Hepatic Neg liver ROS, hiatal hernia, GERD  Medicated,  Endo/Other  negative endocrine ROS  Renal/GU negative Renal ROS  negative genitourinary   Musculoskeletal  (+) Arthritis ,   Abdominal   Peds negative pediatric ROS (+)  Hematology negative hematology ROS (+)   Anesthesia Other Findings - Pyloric Stenosis -   Reproductive/Obstetrics negative OB ROS                            Lab Results  Component Value Date   WBC 5.4 05/25/2015   HGB 13.5 05/25/2015   HCT 40.6 05/25/2015   MCV 88.6 05/25/2015   PLT 266 05/25/2015   Lab Results  Component Value Date   INR 1.07 05/25/2015   INR 0.94 11/08/2009   05/2015 EKG: normal sinus rhythm, LBBB.   Anesthesia Physical Anesthesia Plan  ASA: III  Anesthesia Plan: Spinal   Post-op Pain Management:    Induction: Intravenous  Airway Management Planned: Natural Airway  Additional Equipment:   Intra-op Plan:   Post-operative Plan:   Informed Consent: I have reviewed the patients History and Physical, chart, labs and discussed the procedure including the risks, benefits and alternatives for the proposed anesthesia with the patient or authorized representative who has indicated his/her understanding and acceptance.   Dental advisory given  Plan  Discussed with:   Anesthesia Plan Comments:         Anesthesia Quick Evaluation

## 2015-06-07 NOTE — Transfer of Care (Signed)
Immediate Anesthesia Transfer of Care Note  Patient: Danielle Garrett  Procedure(s) Performed: Procedure(s): TOTAL HIP ARTHROPLASTY ANTERIOR APPROACH (Right)  Patient Location: PACU  Anesthesia Type:MAC and Spinal  Level of Consciousness: awake, alert , oriented and patient cooperative  Airway & Oxygen Therapy: Patient Spontanous Breathing and Patient connected to face mask oxygen  Post-op Assessment: Report given to RN and Post -op Vital signs reviewed and stable  Post vital signs: Reviewed  Last Vitals:  Filed Vitals:   06/07/15 0954 06/07/15 1440  BP: 146/91   Pulse: 95   Temp: 37 C 36.4 C  Resp: 20     Complications: No apparent anesthesia complications

## 2015-06-08 ENCOUNTER — Encounter (HOSPITAL_COMMUNITY): Payer: Self-pay | Admitting: Orthopedic Surgery

## 2015-06-08 ENCOUNTER — Encounter: Payer: Self-pay | Admitting: Interventional Cardiology

## 2015-06-08 LAB — CBC
HCT: 32.7 % — ABNORMAL LOW (ref 36.0–46.0)
Hemoglobin: 10.9 g/dL — ABNORMAL LOW (ref 12.0–15.0)
MCH: 29.5 pg (ref 26.0–34.0)
MCHC: 33.3 g/dL (ref 30.0–36.0)
MCV: 88.4 fL (ref 78.0–100.0)
Platelets: 206 K/uL (ref 150–400)
RBC: 3.7 MIL/uL — ABNORMAL LOW (ref 3.87–5.11)
RDW: 13.1 % (ref 11.5–15.5)
WBC: 11.2 K/uL — ABNORMAL HIGH (ref 4.0–10.5)

## 2015-06-08 LAB — BASIC METABOLIC PANEL WITH GFR
Anion gap: 7 (ref 5–15)
BUN: 6 mg/dL (ref 6–20)
CO2: 25 mmol/L (ref 22–32)
Calcium: 8.5 mg/dL — ABNORMAL LOW (ref 8.9–10.3)
Chloride: 99 mmol/L — ABNORMAL LOW (ref 101–111)
Creatinine, Ser: 0.8 mg/dL (ref 0.44–1.00)
GFR calc Af Amer: >60 mL/min
GFR calc non Af Amer: >60 mL/min
Glucose, Bld: 176 mg/dL — ABNORMAL HIGH (ref 65–99)
Potassium: 4.3 mmol/L (ref 3.5–5.1)
Sodium: 131 mmol/L — ABNORMAL LOW (ref 135–145)

## 2015-06-08 NOTE — Progress Notes (Signed)
OT Cancellation Note  Patient Details Name: Danielle Garrett MRN: [id number] DOB: 12/07/1950   Cancelled Treatment:    Reason Eval/Treat Not Completed: Fatigue/lethargy limiting ability to participate - pt stated that she was exhausted from working with Physical Therapy and requested OT to come back tomorrow. Will attempt to complete OT evaluation tomorrow if time allows.   Redmond Baseman, OTR/L Pager: [phone] 06/08/2015, 4:03 PM

## 2015-06-08 NOTE — Progress Notes (Signed)
Physical Therapy Treatment Patient Details Name: Danielle Garrett MRN: [id number] DOB: [birth year] Today's Date: 06/08/2015    History of Present Illness 65 y.o. female now s/p anterior Rt THA on 06/07/15. PMH: hypertension, anxiety, Lt BBB, anginal pain.     PT Comments    Patient able to progress with mobility during second PT session with ambulation of 80 feet total using rw and min guard assist. The patient did c/o intermittent anterior thigh burning. This did seem to improve with activity and with cues on technique for getting in/out of bed. Mild nausea but no emesis during session. Will continue to follow, anticipate d/c to home with family support when medically released.   Follow Up Recommendations  Home health PT;Supervision for mobility/OOB     Equipment Recommendations  None recommended by PT    Recommendations for Other Services       Precautions / Restrictions Precautions Precautions: Fall Precaution Comments: no hip precautions Restrictions Weight Bearing Restrictions: Yes RLE Weight Bearing: Weight bearing as tolerated    Mobility  Bed Mobility Overal bed mobility: Needs Assistance Bed Mobility: Supine to Sit;Sit to Supine     Supine to sit: Min assist (Rt LE) Sit to supine: Min assist (Rt LE)   General bed mobility comments: assisting with Rt LE  Transfers Overall transfer level: Needs assistance Equipment used: Rolling walker (2 wheeled) Transfers: Sit to/from Stand Sit to Stand: Min guard         General transfer comment: transfers from bed and BSC.  Ambulation/Gait Ambulation/Gait assistance: Min guard Ambulation Distance (Feet): 80 Feet (70 feet X1, 10 feet X1) Assistive device: Rolling walker (2 wheeled) Gait Pattern/deviations: Step-through pattern;Decreased step length - left;Decreased step length - right;Decreased weight shift to right Gait velocity: decreased   General Gait Details: slow pattern but no loss of balance, pt reports one  incident of burning pain in anterior thigh.    Stairs            Wheelchair Mobility    Modified Rankin (Stroke Patients Only)       Balance Overall balance assessment: Needs assistance Sitting-balance support: No upper extremity supported Sitting balance-Leahy Scale: Fair     Standing balance support: Single extremity supported;No upper extremity supported Standing balance-Leahy Scale: Fair Standing balance comment: static standing, washing hands at sink.                    Cognition Arousal/Alertness: Awake/alert Behavior During Therapy: WFL for tasks assessed/performed Overall Cognitive Status: Within Functional Limits for tasks assessed                      Exercises Total Joint Exercises Ankle Circles/Pumps: AROM;Both;10 reps Quad Sets: Strengthening;Right;10 reps Gluteal Sets: Strengthening;Both;10 reps Heel Slides: AAROM;Right;10 reps    General Comments General comments (skin integrity, edema, etc.): Patient request returning to bed to work on technique, to get OOB for dinner.       Pertinent Vitals/Pain Pain Assessment: 0-10 Pain Score: 5  Pain Location: rt hip Pain Descriptors / Indicators: Burning Pain Intervention(s): Limited activity within patient's tolerance;Monitored during session    Home Living                      Prior Function            PT Goals (current goals can now be found in the care plan section) Acute Rehab PT Goals Patient Stated Goal: return home with husband PT Goal Formulation:  With patient Time For Goal Achievement: 06/22/15 Potential to Achieve Goals: Good Progress towards PT goals: Progressing toward goals    Frequency  7X/week    PT Plan Current plan remains appropriate    Co-evaluation             End of Session Equipment Utilized During Treatment: Gait belt Activity Tolerance: Patient tolerated treatment well;Other (comment) (occasional c/o burning pain at anterior  thigh.) Patient left: in bed;with call bell/phone within reach;with SCD's reapplied     Time: [id number] PT Time Calculation (min) (ACUTE ONLY): 30 min  Charges:  $Gait Training: 8-22 mins $Therapeutic Exercise: 8-22 mins                    G Codes:      Cassell Clement, PT, CSCS Pager [phone] Office [phone]  06/08/2015, 3:59 PM

## 2015-06-08 NOTE — Progress Notes (Addendum)
Patient ID: Danielle Garrett, female   DOB: 12-02-1950, 65 y.o.   MRN: [id number] PATIENT ID: Danielle Garrett  MRN: [id number]  DOB/AGE:  [birth year] / 64 y.o.  1 Day Post-Op Procedure(s) (LRB): TOTAL HIP ARTHROPLASTY ANTERIOR APPROACH (Right)    PROGRESS NOTE Subjective: Patient is alert, oriented, yes Nausea, x4 Vomiting, yes passing gas, . Taking PO well. Denies SOB, Chest or Calf Pain. Using Incentive Spirometer, PAS in place. Ambulate WBAT Patient reports pain as  7/10  .    Objective: Vital signs in last 24 hours: Filed Vitals:   06/07/15 1900 06/07/15 1945 06/07/15 2100 06/08/15 0500  BP: 113/73 123/75 129/75 124/70  Pulse: 87 102 91 91  Temp:  98.1 F (36.7 C) 97.5 F (36.4 C) 97.4 F (36.3 C)  TempSrc:      Resp: 10 14 16 16   Height:      Weight:      SpO2: 100% 100% 100% 100%      Intake/Output from previous day: I/O last 3 completed shifts: In: 2000 [I.V.:2000] Out: 404 [Urine:200; Emesis/NG output:4; Blood:200]   Intake/Output this shift:     LABORATORY DATA:  Recent Labs  06/07/15 1622 06/07/15 1657 06/07/15 1806  GLUCAP 64* 70 83    Examination: Neurologically intact ABD soft Neurovascular intact Sensation intact distally Intact pulses distally Dorsiflexion/Plantar flexion intact Incision: dressing C/D/I No cellulitis present Compartment soft} XR AP&Lat of hip shows well placed\fixed THA  Assessment:   1 Day Post-Op Procedure(s) (LRB): TOTAL HIP ARTHROPLASTY ANTERIOR APPROACH (Right) ADDITIONAL DIAGNOSIS:  Expected Acute Blood Loss Anemia, LBBB  Plan: PT/OT WBAT, THA  DVT Prophylaxis: SCDx72 hrs, ASA 325 mg BID x 2 weeks  DISCHARGE PLAN: Home, prob tomorrow  DISCHARGE NEEDS: HHPT, Walker and 3-in-1 comode seat

## 2015-06-08 NOTE — Evaluation (Signed)
Physical Therapy Evaluation Patient Details Name: Danielle Garrett MRN: [id number] DOB: 1950-11-07 Today's Date: 06/08/2015   History of Present Illness  65 y.o. female now s/p anterior Rt THA on 06/07/15. PMH: hypertension, anxiety, Lt BBB, anginal pain.   Clinical Impression  Pt is s/p Rt anterior THA resulting in the deficits listed below (see PT Problem List).  Pt will benefit from skilled PT to increase their independence and safety with mobility to allow discharge to the venue listed below. Initial session limited by the patient's reports of nausea which did result in emesis. Nursing notified. She was able to ambulate 20 feet with rw and min guard assistance. Anticipate the patient will progress with mobility during PT sessions and D/C to home with her husbands support. PT to continue to follow and progress as tolerated.      Follow Up Recommendations Home health PT;Supervision for mobility/OOB    Equipment Recommendations  None recommended by PT (reports having rw at home.)    Recommendations for Other Services       Precautions / Restrictions Precautions Precautions: Fall Precaution Comments: no hip precautions Restrictions Weight Bearing Restrictions: Yes RLE Weight Bearing: Weight bearing as tolerated      Mobility  Bed Mobility Overal bed mobility: Needs Assistance Bed Mobility: Supine to Sit     Supine to sit: Mod assist;HOB elevated (HOB approx. 20 degrees, assist provided with Rt LE)     General bed mobility comments: assist needed with Rt LE, cues for technique to get EOB.   Transfers Overall transfer level: Needs assistance Equipment used: Rolling walker (2 wheeled) Transfers: Sit to/from Stand Sit to Stand: Min assist         General transfer comment: Sit/stand performed from bed to [facility] and then to chair. Cues for hand placement. Pt. guarded and expressing pain with assuming sitting position.   Ambulation/Gait Ambulation/Gait assistance: Min  guard Ambulation Distance (Feet): 20 Feet Assistive device: Rolling walker (2 wheeled) Gait Pattern/deviations: Step-to pattern;Decreased weight shift to right Gait velocity: decreased   General Gait Details: Patient maintain partial weight bearing during ambulation, encouraging increased weight as tolerated.   Stairs            Wheelchair Mobility    Modified Rankin (Stroke Patients Only)       Balance Overall balance assessment: Needs assistance Sitting-balance support: No upper extremity supported Sitting balance-Leahy Scale: Fair     Standing balance support: Bilateral upper extremity supported Standing balance-Leahy Scale: Poor Standing balance comment: using rw                             Pertinent Vitals/Pain Pain Assessment: 0-10 Pain Score: 5  Pain Location: rt hip Pain Descriptors / Indicators: Burning Pain Intervention(s): Limited activity within patient's tolerance;Monitored during session    Home Living Family/patient expects to be discharged to:: Private residence Living Arrangements: Spouse/significant other Available Help at Discharge: Family;Available 24 hours/day Type of Home: House Home Access: Ramped entrance     Home Layout: One level Home Equipment: Walker - 2 wheels      Prior Function Level of Independence: Independent with assistive device(s)         Comments: reports using rw PTA     Hand Dominance        Extremity/Trunk Assessment               Lower Extremity Assessment: RLE deficits/detail RLE Deficits / Details: assist needed with moving  Rt LE with bed mobility.        Communication   Communication: No difficulties  Cognition Arousal/Alertness: Awake/alert Behavior During Therapy: WFL for tasks assessed/performed Overall Cognitive Status: Within Functional Limits for tasks assessed                      General Comments General comments (skin integrity, edema, etc.): Patient c/o  nausea with emesis following ambulation, nursing notified.     Exercises        Assessment/Plan    PT Assessment Patient needs continued PT services  PT Diagnosis Difficulty walking;Generalized weakness;Acute pain   PT Problem List Decreased strength;Decreased activity tolerance;Decreased range of motion;Decreased balance;Decreased mobility;Pain  PT Treatment Interventions DME instruction;Gait training;Stair training;Functional mobility training;Therapeutic activities;Therapeutic exercise;Balance training;Patient/family education   PT Goals (Current goals can be found in the Care Plan section) Acute Rehab PT Goals Patient Stated Goal: return home with husband PT Goal Formulation: With patient Time For Goal Achievement: 06/22/15 Potential to Achieve Goals: Good    Frequency 7X/week   Barriers to discharge        Co-evaluation               End of Session Equipment Utilized During Treatment: Gait belt Activity Tolerance: Other (comment) (nausea) Patient left: in chair;with call bell/phone within reach;with family/visitor present Nurse Communication: Mobility status;Other (comment) (nausea with emesis)         Time: [id number] PT Time Calculation (min) (ACUTE ONLY): 28 min   Charges:   PT Evaluation $PT Eval Moderate Complexity: 1 Procedure PT Treatments $Therapeutic Activity: 8-22 mins   PT G Codes:        Cassell Clement, PT, CSCS Pager [phone] Office [phone]  06/08/2015, 11:44 AM

## 2015-06-09 LAB — CBC
HCT: 29.4 % — ABNORMAL LOW (ref 36.0–46.0)
Hemoglobin: 10.3 g/dL — ABNORMAL LOW (ref 12.0–15.0)
MCH: 31.1 pg (ref 26.0–34.0)
MCHC: 35 g/dL (ref 30.0–36.0)
MCV: 88.8 fL (ref 78.0–100.0)
Platelets: 183 10*3/uL (ref 150–400)
RBC: 3.31 MIL/uL — ABNORMAL LOW (ref 3.87–5.11)
RDW: 13.2 % (ref 11.5–15.5)
WBC: 12.3 10*3/uL — ABNORMAL HIGH (ref 4.0–10.5)

## 2015-06-09 MED ORDER — OXYCODONE-ACETAMINOPHEN 5-325 MG PO TABS: 1.0000 | ORAL_TABLET | ORAL | Status: DC | PRN

## 2015-06-09 MED ORDER — ASPIRIN EC 325 MG PO TBEC: 325.0000 mg | DELAYED_RELEASE_TABLET | Freq: Two times a day (BID) | ORAL | Status: DC

## 2015-06-09 MED ORDER — TIZANIDINE HCL 2 MG PO CAPS: 2.0000 mg | ORAL_CAPSULE | Freq: Three times a day (TID) | ORAL | Status: DC

## 2015-06-09 NOTE — Progress Notes (Signed)
Patient and spouse very concerned re:  Her ability to function at home given sudden onset of severe spasmodic pain initiating in right hip and radiating to anterior right thigh.  Oxycodone and Robaxin initially unsuccessful with no affect in increasing her ability to mobilize.  Dilaudid was given at 1629 with good effect, but she wishes to stay another night due to lack of pain control.  Will monitor her closely throughout the night and encourage a solid pain management schedule with breakthrough medication available.  Patient on board with plan.

## 2015-06-09 NOTE — Discharge Summary (Signed)
Patient ID: Danielle Garrett MRN: [id number] DOB/AGE: Aug 23, 1950 65 y.o.  Admit date: 06/07/2015 Discharge date: 06/09/2015  Admission Diagnoses:  Principal Problem:   Primary osteoarthritis of right hip   Discharge Diagnoses:  Same  Past Medical History  Diagnosis Date  . Hypertension   . Gastroparesis   . Anxiety   . Pyloric stenosis   . Childhood asthma   . Chronic hoarseness   . Allergic rhinitis due to other allergen   . PONV (postoperative nausea and vomiting)   . Left bundle branch block      (abnormal heart rhythm)  . Anginal pain ([place]) 2011    hospitalized for CP in the past; due to low potassium  . GERD (gastroesophageal reflux disease)   . History of hiatal hernia   . Arthritis   . Hx of blood transfusion reaction     in the early 80s    Surgeries: Procedure(s): TOTAL HIP ARTHROPLASTY ANTERIOR APPROACH on 06/07/2015   Consultants:    Discharged Condition: Improved  Hospital Course: Danielle Garrett is an 65 y.o. female who was admitted 06/07/2015 for operative treatment ofPrimary osteoarthritis of right hip. Patient has severe unremitting pain that affects sleep, daily activities, and work/hobbies. After pre-op clearance the patient was taken to the operating room on 06/07/2015 and underwent  Procedure(s): TOTAL HIP ARTHROPLASTY ANTERIOR APPROACH.    Patient was given perioperative antibiotics: Anti-infectives    Start     Dose/Rate Route Frequency Ordered Stop   06/07/15 1130  vancomycin (VANCOCIN) IVPB 1000 mg/200 mL premix     1,000 mg 200 mL/hr over 60 Minutes Intravenous To [facility]Stay [facility] 06/04/15 1352 06/07/15 1257       Patient was given sequential compression devices, early ambulation, and chemoprophylaxis to prevent DVT.  Patient benefited maximally from hospital stay and there were no complications.    Recent vital signs: Patient Vitals for the past 24 hrs:  BP Temp Temp src Pulse Resp SpO2  06/09/15 0500 121/78 mmHg 98.7 F (37.1 C) - 87 16 96 %   06/08/15 1930 129/71 mmHg 99.2 F (37.3 C) Oral 90 16 96 %  06/08/15 1400 140/73 mmHg 98.6 F (37 C) - 94 16 97 %     Recent laboratory studies:  Recent Labs  06/08/15 0658  WBC 11.2*  HGB 10.9*  HCT 32.7*  PLT 206  NA 131*  K 4.3  CL 99*  CO2 25  BUN 6  CREATININE 0.80  GLUCOSE 176*  CALCIUM 8.5*     Discharge Medications:     Medication List    TAKE these medications        amLODipine 2.5 MG tablet  Commonly known as:  NORVASC  Take 2.5 mg by mouth daily.     aspirin EC 81 MG tablet  Take 81 mg by mouth daily.     aspirin EC 325 MG tablet  Take 1 tablet (325 mg total) by mouth 2 (two) times daily.     docusate sodium 100 MG capsule  Commonly known as:  COLACE  Take 100 mg by mouth 2 (two) times daily.     FLUoxetine 10 MG capsule  Commonly known as:  PROZAC  Take 10 mg by mouth daily.     losartan 100 MG tablet  Commonly known as:  COZAAR  Take 50 mg by mouth 2 (two) times daily.     NASACORT ALLERGY 24HR 55 MCG/ACT Aero nasal inhaler  Generic drug:  triamcinolone  Place 2 sprays into the  nose daily as needed (for allergies).     oxyCODONE-acetaminophen 5-325 MG tablet  Commonly known as:  ROXICET  Take 1 tablet by mouth every 4 (four) hours as needed.     pantoprazole 40 MG tablet  Commonly known as:  PROTONIX  Take 40 mg by mouth 2 (two) times daily.     sucralfate 1 g tablet  Commonly known as:  CARAFATE  Take 1 g by mouth 2 (two) times daily.     tizanidine 2 MG capsule  Commonly known as:  ZANAFLEX  Take 1 capsule (2 mg total) by mouth 3 (three) times daily.     traMADol 50 MG tablet  Commonly known as:  ULTRAM  Take 50 mg by mouth every 6 (six) hours as needed for moderate pain.        Diagnostic Studies: Dg Chest 2 View  05/25/2015  CLINICAL DATA:  Preoperative assessment for RIGHT anterior hip arthroplasty, history hypertension, LEFT bundle branch block EXAM: CHEST  2 VIEW COMPARISON:  02/19/2012 FINDINGS: Normal heart size,  mediastinal contours, and pulmonary vascularity. Lungs hyperinflated but clear. Minimal biapical scarring. No acute infiltrate, pleural effusion or pneumothorax. Prior cervical spine fusion. No acute osseous findings. IMPRESSION: Hyperinflated lungs without acute abnormalities. Electronically Signed   By: Lavonia Dana M.D.   On: 05/25/2015 15:14   Dg Pelvis Portable  06/07/2015  CLINICAL DATA:  Status post right hip arthroplasty. EXAM: PORTABLE PELVIS 1-2 VIEWS COMPARISON:  Pelvis CT dated 04/09/2015. FINDINGS: Interval right hip bipolar prosthesis in satisfactory position and alignment. No fracture or dislocation seen. IMPRESSION: Satisfactory postoperative appearance of a right hip bipolar prosthesis. Electronically Signed   By: Claudie Revering M.D.   On: 06/07/2015 15:06   Dg Hip Operative Unilat With Pelvis Right  06/07/2015  CLINICAL DATA:  Operative imaging following right total hip arthroplasty. EXAM: OPERATIVE RIGHT HIP (WITH PELVIS IF PERFORMED) 2 VIEWS TECHNIQUE: Fluoroscopic spot image(s) were submitted for interpretation post-operatively. COMPARISON:  02/11/2015 FINDINGS: On the submitted views, the total right hip arthroplasty appears well seated and aligned. There is no acute fracture or evidence of an operative complication. IMPRESSION: Well-aligned total right hip arthroplasty. Electronically Signed   By: Lajean Manes M.D.   On: 06/07/2015 14:13    Disposition: 01-Home or Self Care      Discharge Instructions    Call MD / Call 911    Complete by:  As directed   If you experience chest pain or shortness of breath, CALL 911 and be transported to the hospital emergency room.  If you develope a fever above 101 F, pus (white drainage) or increased drainage or redness at the wound, or calf pain, call your surgeon's office.     Change dressing    Complete by:  As directed   You may change your dressing on day 5, then change the dressing daily with sterile 4 x 4 inch gauze dressing and paper  tape.  You may clean the incision with alcohol prior to redressing     Constipation Prevention    Complete by:  As directed   Drink plenty of fluids.  Prune juice may be helpful.  You may use a stool softener, such as Colace (over the counter) 100 mg twice a day.  Use MiraLax (over the counter) for constipation as needed.     Diet - low sodium heart healthy    Complete by:  As directed      Driving restrictions    Complete by:  As directed   No driving for 2 weeks     Follow the hip precautions as taught in Physical Therapy    Complete by:  As directed      Increase activity slowly as tolerated    Complete by:  As directed      Patient may shower    Complete by:  As directed   You may shower without a dressing once there is no drainage.  Do not wash over the wound.  If drainage remains, cover wound with plastic wrap and then shower.           Follow-up Information    Follow up with Kerin Salen, MD In 2 weeks.   Specialty:  Orthopedic Surgery   Contact information:   [place] [address] [phone]        Signed: Theodosia Quay 06/09/2015, 7:49 AM

## 2015-06-09 NOTE — Discharge Instructions (Signed)

## 2015-06-09 NOTE — Progress Notes (Signed)
PATIENT ID: Danielle Garrett  MRN: [id number]  DOB/AGE:  1950-10-03 / 65 y.o.  2 Days Post-Op Procedure(s) (LRB): TOTAL HIP ARTHROPLASTY ANTERIOR APPROACH (Right)    PROGRESS NOTE Subjective: Patient is alert, oriented, mild Nausea, no Vomiting, yes passing gas, . Taking PO ok with pt eating small bites. Denies SOB, Chest or Calf Pain. Using Incentive Spirometer, PAS in place. Ambulate WBAT with pt walking 80 ft with therapy Patient reports pain as  4/10  .    Objective: Vital signs in last 24 hours: Filed Vitals:   06/08/15 0500 06/08/15 1400 06/08/15 1930 06/09/15 0500  BP: 124/70 140/73 129/71 121/78  Pulse: 91 94 90 87  Temp: 97.4 F (36.3 C) 98.6 F (37 C) 99.2 F (37.3 C) 98.7 F (37.1 C)  TempSrc:   Oral   Resp: 16 16 16 16   Height:      Weight:      SpO2: 100% 97% 96% 96%      Intake/Output from previous day: I/O last 3 completed shifts: In: 3119.3 [P.O.:720; I.V.:2399.3] Out: 4 [Emesis/NG output:4]   Intake/Output this shift:     LABORATORY DATA:  Recent Labs  06/07/15 1622 06/07/15 1657 06/07/15 1806 06/08/15 0658  WBC  --   --   --  11.2*  HGB  --   --   --  10.9*  HCT  --   --   --  32.7*  PLT  --   --   --  206  NA  --   --   --  131*  K  --   --   --  4.3  CL  --   --   --  99*  CO2  --   --   --  25  BUN  --   --   --  6  CREATININE  --   --   --  0.80  GLUCOSE  --   --   --  176*  GLUCAP 64* 70 83  --   CALCIUM  --   --   --  8.5*    Examination: Neurologically intact Neurovascular intact Sensation intact distally Intact pulses distally Dorsiflexion/Plantar flexion intact Incision: dressing C/D/I No cellulitis present Compartment soft} XR AP&Lat of hip shows well placed\fixed THA  Assessment:   2 Days Post-Op Procedure(s) (LRB): TOTAL HIP ARTHROPLASTY ANTERIOR APPROACH (Right) ADDITIONAL DIAGNOSIS:  Expected Acute Blood Loss Anemia, LBBB  Plan: PT/OT WBAT, THA  DVT Prophylaxis: SCDx72 hrs, ASA 325 mg BID x 2 weeks  DISCHARGE  PLAN: Home, when pt passes therapy goals  DISCHARGE NEEDS: HHPT, Walker and 3-in-1 comode seat

## 2015-06-09 NOTE — Progress Notes (Signed)
Physical Therapy Treatment Patient Details Name: Danielle Garrett MRN: [id number] DOB: [birth year] Today's Date: 06/09/2015    History of Present Illness 65 y.o. female now s/p anterior Rt THA on 06/07/15. PMH: hypertension, anxiety, Lt BBB, anginal pain.     PT Comments    Patient is making good progress with PT.Educated on HEP and use of ice.  From a mobility standpoint anticipate patient will be ready for DC home when medically ready.       Follow Up Recommendations  Home health PT;Supervision for mobility/OOB     Equipment Recommendations  None recommended by PT    Recommendations for Other Services       Precautions / Restrictions Precautions Precautions: Fall Precaution Comments: no hip precautions Restrictions Weight Bearing Restrictions: Yes RLE Weight Bearing: Weight bearing as tolerated    Mobility  Bed Mobility Overal bed mobility: Needs Assistance Bed Mobility: Supine to Sit     Supine to sit: Min guard Sit to supine: Min assist   General bed mobility comments: min guard for safety; increased time needed but no physcial assist needed; L LE assist to bring R LE to EOB   Transfers Overall transfer level: Needs assistance Equipment used: Rolling walker (2 wheeled) Transfers: Sit to/from Stand Sit to Stand: Supervision         General transfer comment: supervision for safety; carry over of safe hand placement  Ambulation/Gait Ambulation/Gait assistance: Min guard Ambulation Distance (Feet): 180 Feet Assistive device: Rolling walker (2 wheeled) Gait Pattern/deviations: Step-to pattern;Step-through pattern;Decreased step length - left;Decreased stance time - right Gait velocity: decreased   General Gait Details: pt with improved symmetry of step lengths and with ability to increase WB on R LE and get better bilat foot clearance; cues for sequencing and position of RW   Stairs            Wheelchair Mobility    Modified Rankin (Stroke Patients  Only)       Balance Overall balance assessment: Needs assistance Sitting-balance support: No upper extremity supported;Feet supported Sitting balance-Leahy Scale: Good     Standing balance support: During functional activity Standing balance-Leahy Scale: Fair                      Cognition Arousal/Alertness: Awake/alert Behavior During Therapy: WFL for tasks assessed/performed Overall Cognitive Status: Within Functional Limits for tasks assessed                      Exercises Total Joint Exercises Heel Slides: AROM;Right;10 reps;Supine    General Comments        Pertinent Vitals/Pain Pain Assessment: 0-10 Pain Score: 5  Pain Location: R thigh; R hamstring Pain Descriptors / Indicators: Aching;Cramping;Sore Pain Intervention(s): Limited activity within patient's tolerance;Monitored during session;Repositioned;Ice applied;RN gave pain meds during session    [place] expects to be discharged to:: Private residence Living Arrangements: Spouse/significant other Available Help at Discharge: Family;Available 24 hours/day Type of Home: House Home Access: Ramped entrance   Home Layout: One level Home Equipment: Walker - 2 wheels;Shower seat;Hand held shower head;Grab bars - tub/shower;Bedside commode;Adaptive equipment Additional Comments: husband available 24/7 until next Monday; sister and adopted daughter available daily after that    Prior Function Level of Independence: Independent with assistive device(s)      Comments: reports using RW since December   PT Goals (current goals can now be found in the care plan section) Acute Rehab PT Goals Patient Stated Goal: go home  PT Goal Formulation: With patient Time For Goal Achievement: 06/22/15 Potential to Achieve Goals: Good Progress towards PT goals: Progressing toward goals    Frequency  7X/week    PT Plan Current plan remains appropriate    Co-evaluation              End of Session Equipment Utilized During Treatment: Gait belt Activity Tolerance: Patient tolerated treatment well Patient left: with call bell/phone within reach;in chair     Time: [id number] PT Time Calculation (min) (ACUTE ONLY): 32 min  Charges:  $Gait Training: 8-22 mins $Therapeutic Activity: 8-22 mins                    G Codes:      Salina April, PTA Pager: [phone]   06/09/2015, 2:42 PM

## 2015-06-09 NOTE — Progress Notes (Signed)
Occupational Therapy Evaluation/Discharge Patient Details Name: Danielle Garrett MRN: [id number] DOB: [birth year] Today's Date: 06/09/2015    History of Present Illness 65 y.o. female now s/p anterior Rt THA on 06/07/15. PMH: hypertension, anxiety, Lt BBB, anginal pain.    Clinical Impression   PTA, pt was independent with ADLs and had been using RW for mobility since December 2016. Pt currently requires min guard assist for all functional transfers and min assist for LB ADLs. Educated pt on compensatory strategies for ADLs including use of AE, pain/edema management, energy conservation, and fall prevention strategies. Pt plans to d/c home with 24/7 assistance from her husband and other family members. All education has been completed and pt has no further questions. Pt with no further acute OT needs. OT signing off.    Follow Up Recommendations  No OT follow up;Supervision/Assistance - 24 hour    Equipment Recommendations  None recommended by OT (Equipment already delivered to home)    Recommendations for Other Services       Precautions / Restrictions Precautions Precautions: Fall Precaution Comments: no hip precautions Restrictions Weight Bearing Restrictions: Yes RLE Weight Bearing: Weight bearing as tolerated      Mobility Bed Mobility Overal bed mobility: Needs Assistance Bed Mobility: Sit to Supine     Supine to sit: Min assist Sit to supine: Min assist   General bed mobility comments: HOB flat, use of bedrails. Min assist to bring RLE onto bed. Pt demonstrated hook method using L foot to assist moving RLE.   Transfers Overall transfer level: Needs assistance Equipment used: Rolling walker (2 wheeled) Transfers: Sit to/from Stand Sit to Stand: Min guard         General transfer comment: Good demonstration of safe hand placement. No physical assist required. Min guard assist for safety due to increased pain    Balance Overall balance assessment: Needs  assistance Sitting-balance support: No upper extremity supported;Feet supported Sitting balance-Leahy Scale: Fair     Standing balance support: During functional activity;No upper extremity supported Standing balance-Leahy Scale: Fair Standing balance comment: able to perform hand hygiene with no UE support                            ADL Overall ADL's : Needs assistance/impaired     Grooming: Wash/dry hands;Min guard;Standing           Upper Body Dressing : Supervision/safety;Sitting   Lower Body Dressing: Minimal assistance;With caregiver independent assisting;Sit to/from stand;Cueing for compensatory techniques Lower Body Dressing Details (indicate cue type and reason): Cues to dress RLE first and undress it last Toilet Transfer: Min guard;Ambulation;BSC;RW Toilet Transfer Details (indicate cue type and reason): BSC over toilet Toileting- Clothing Manipulation and Hygiene: Min guard;Sit to/from stand   Tub/ Shower Transfer: Walk-in shower;Min guard;Cueing for sequencing;Ambulation;Rolling walker Tub/Shower Transfer Details (indicate cue type and reason): cues for proper step sequence with RW Functional mobility during ADLs: Min guard;Rolling walker General ADL Comments: Reviewed compensatory strategies for bathing and dressing, pain/edema management, fall prevention, and energy conservation strategies.     Vision Vision Assessment?: No apparent visual deficits   Perception     Praxis      Pertinent Vitals/Pain Pain Assessment: 0-10 Pain Score: 7  Faces Pain Scale: Hurts even more Pain Location: R thigh Pain Descriptors / Indicators: Burning Pain Intervention(s): Limited activity within patient's tolerance;Monitored during session;Repositioned;Ice applied     Hand Dominance Right   Extremity/Trunk Assessment Upper Extremity Assessment Upper Extremity  Assessment: Overall WFL for tasks assessed   Lower Extremity Assessment Lower Extremity Assessment:  RLE deficits/detail RLE Deficits / Details: decreased ROM and strength as expected post op   Cervical / Trunk Assessment Cervical / Trunk Assessment: Normal   Communication Communication Communication: No difficulties   Cognition Arousal/Alertness: Awake/alert Behavior During Therapy: WFL for tasks assessed/performed Overall Cognitive Status: Within Functional Limits for tasks assessed                     General Comments       Exercises Exercises: Total Joint     Shoulder Instructions      Home Living Family/patient expects to be discharged to:: Private residence Living Arrangements: Spouse/significant other Available Help at Discharge: Family;Available 24 hours/day Type of Home: House Home Access: Ramped entrance     Home Layout: One level     Bathroom Shower/Tub: Walk-in shower;Door   [employer] Toilet: Handicapped height Bathroom Accessibility: Yes How Accessible: Accessible via walker Home Equipment: Meyer - 2 wheels;Shower seat;Hand held shower head;Grab bars - tub/shower;Bedside commode;Adaptive equipment Adaptive Equipment: Reacher;Long-handled shoe horn Additional Comments: husband available 24/7 until next Monday; sister and adopted daughter available daily after that      Prior Functioning/Environment Level of Independence: Independent with assistive device(s)        Comments: reports using RW since December    OT Diagnosis: Acute pain   OT Problem List: Decreased strength;Decreased range of motion;Decreased activity tolerance;Impaired balance (sitting and/or standing);Decreased coordination;Decreased knowledge of use of DME or AE;Decreased safety awareness;Pain   OT Treatment/Interventions:      OT Goals(Current goals can be found in the care plan section) Acute Rehab OT Goals Patient Stated Goal: to do everything right to speed up healing process OT Goal Formulation: With patient Time For Goal Achievement: 06/23/15 Potential to Achieve  Goals: Good  OT Frequency:     Barriers to D/C:            Co-evaluation              End of Session Equipment Utilized During Treatment: Gait belt;Rolling walker Nurse Communication: Mobility status  Activity Tolerance: Patient tolerated treatment well Patient left: in bed;with call bell/phone within reach;with family/visitor present;with SCD's reapplied   Time: [id number] OT Time Calculation (min): 21 min Charges:  OT General Charges $OT Visit: 1 Procedure OT Evaluation $OT Eval Moderate Complexity: 1 Procedure G-Codes:    Redmond Baseman, OTR/L [phone] 06/09/2015, 11:11 AM

## 2015-06-09 NOTE — Progress Notes (Signed)
Physical Therapy Treatment Patient Details Name: Jermika Oiler MRN: [id number] DOB: 07/19/1950 Today's Date: 06/09/2015    History of Present Illness 65 y.o. female now s/p anterior Rt THA on 06/07/15. PMH: hypertension, anxiety, Lt BBB, anginal pain.     PT Comments    Patient progressing toward goals with improvement in mobility. Pt motivated and eager to participate in therapy. Continue to progress as tolerated with anticipated d/c home with HHPT.   Follow Up Recommendations  Home health PT;Supervision for mobility/OOB     Equipment Recommendations  None recommended by PT    Recommendations for Other Services       Precautions / Restrictions Precautions Precautions: Fall Precaution Comments: no hip precautions Restrictions Weight Bearing Restrictions: Yes RLE Weight Bearing: Weight bearing as tolerated    Mobility  Bed Mobility Overal bed mobility: Needs Assistance Bed Mobility: Supine to Sit     Supine to sit: Min assist     General bed mobility comments: HOB flat and no use of bed rails; pt assisted with L LE to bring R LE to EOB but required assistance to bring R LE over edge of bed slowly  Transfers Overall transfer level: Needs assistance Equipment used: Rolling walker (2 wheeled) Transfers: Sit to/from Stand Sit to Stand: Min guard         General transfer comment: from EOB and 3 in 1; min guard for safety with carry over of safe hand placement and technique  Ambulation/Gait Ambulation/Gait assistance: Min guard Ambulation Distance (Feet): 120 Feet Assistive device: Rolling walker (2 wheeled) Gait Pattern/deviations: Step-to pattern;Decreased step length - left;Decreased stance time - right;Antalgic Gait velocity: decreased   General Gait Details: slow and gaurded movements; pt with improved ability to WS to R LE after intiial ~50 ft; vc for sequencing, position of RW, and posture   Stairs            Wheelchair Mobility    Modified Rankin  (Stroke Patients Only)       Balance Overall balance assessment: Needs assistance Sitting-balance support: No upper extremity supported;Feet supported Sitting balance-Leahy Scale: Good     Standing balance support: No upper extremity supported Standing balance-Leahy Scale: Fair Standing balance comment: able to perform hand hygiene with no UE support                    Cognition Arousal/Alertness: Awake/alert Behavior During Therapy: WFL for tasks assessed/performed Overall Cognitive Status: Within Functional Limits for tasks assessed                      Exercises Total Joint Exercises Quad Sets: Strengthening;10 reps;Both;Seated Gluteal Sets: Strengthening;Both;10 reps;Seated Heel Slides: Right;10 reps;AROM;Seated Hip ABduction/ADduction: AROM;Right;10 reps;Seated    General Comments General comments (skin integrity, edema, etc.): pt with c/o nausea toward end of session but subsided after seated break and with no emesis      Pertinent Vitals/Pain Pain Assessment: Faces Faces Pain Scale: Hurts even more Pain Location: R thigh Pain Descriptors / Indicators: Burning;Aching;Sore Pain Intervention(s): Limited activity within patient's tolerance;Monitored during session;Repositioned;Patient requesting pain meds-RN notified    Home Living                      Prior Function            PT Goals (current goals can now be found in the care plan section) Acute Rehab PT Goals Patient Stated Goal: go home PT Goal Formulation: With patient Time For  Goal Achievement: 06/22/15 Potential to Achieve Goals: Good Progress towards PT goals: Progressing toward goals    Frequency  7X/week    PT Plan Current plan remains appropriate    Co-evaluation             End of Session Equipment Utilized During Treatment: Gait belt Activity Tolerance: Patient tolerated treatment well Patient left: with call bell/phone within reach;in chair;with  family/visitor present;with nursing/sitter in room     Time: 0905-0939 PT Time Calculation (min) (ACUTE ONLY): 34 min  Charges:  $Gait Training: 8-22 mins $Therapeutic Exercise: 8-22 mins                    G Codes:      Salina April, PTA Pager: [phone]   06/09/2015, 9:58 AM

## 2015-06-10 LAB — CBC
HCT: 29.5 % — ABNORMAL LOW (ref 36.0–46.0)
Hemoglobin: 9.9 g/dL — ABNORMAL LOW (ref 12.0–15.0)
MCH: 29.6 pg (ref 26.0–34.0)
MCHC: 33.6 g/dL (ref 30.0–36.0)
MCV: 88.3 fL (ref 78.0–100.0)
Platelets: 181 10*3/uL (ref 150–400)
RBC: 3.34 MIL/uL — ABNORMAL LOW (ref 3.87–5.11)
RDW: 13.3 % (ref 11.5–15.5)
WBC: 8.3 10*3/uL (ref 4.0–10.5)

## 2015-06-10 NOTE — Progress Notes (Signed)
Physical Therapy Treatment Patient Details Name: Danielle Garrett MRN: [id number] DOB: [birth year] Today's Date: 06/10/2015    History of Present Illness 65 y.o. female now s/p anterior Rt THA on 06/07/15. PMH: hypertension, anxiety, Lt BBB, anginal pain.     PT Comments    Patient led through HEP and reviewed handout and use of ice packs. Husband present for session. Pt refused any OOB activity this session due to anticipation of increased pain and reported sitting in recliner this am for 2 hours before returning to bed.   Follow Up Recommendations  Home health PT;Supervision for mobility/OOB     Equipment Recommendations  None recommended by PT    Recommendations for Other Services       Precautions / Restrictions Precautions Precautions: Fall Precaution Comments: no hip precautions Restrictions Weight Bearing Restrictions: Yes RLE Weight Bearing: Weight bearing as tolerated    Mobility  Bed Mobility               General bed mobility comments: pt in bed upon arrival and reported sitting in recliner for 2 hours this am; pt refused OOB activity this session due to anticipated increased pain with activity  Transfers                    Ambulation/Gait                 Stairs            Wheelchair Mobility    Modified Rankin (Stroke Patients Only)       Balance                                    Cognition Arousal/Alertness: Awake/alert Behavior During Therapy: WFL for tasks assessed/performed Overall Cognitive Status: Within Functional Limits for tasks assessed                      Exercises Total Joint Exercises Ankle Circles/Pumps: AROM;Both;10 reps Quad Sets: AROM;Both;10 reps;Supine Gluteal Sets: AROM;Both;10 reps;Supine Short Arc Quad: AROM;Right;10 reps;Supine Heel Slides: AROM;Right;10 reps;Supine Hip ABduction/ADduction: AROM;Right;10 reps;Supine    General Comments General comments (skin integrity,  edema, etc.): pt refused any OOB activity this am; educated pt and husband on HEP with review of handout      Pertinent Vitals/Pain Pain Assessment: 0-10 Pain Score: 6  Pain Location: R thigh with activity Pain Descriptors / Indicators: Sore Pain Intervention(s): Limited activity within patient's tolerance;Monitored during session;Premedicated before session;Ice applied;Repositioned    Home Living                      Prior Function            PT Goals (current goals can now be found in the care plan section) Acute Rehab PT Goals Patient Stated Goal: go home PT Goal Formulation: With patient Time For Goal Achievement: 06/22/15 Potential to Achieve Goals: Good Progress towards PT goals: Progressing toward goals    Frequency  7X/week    PT Plan Current plan remains appropriate    Co-evaluation             End of Session Equipment Utilized During Treatment: Gait belt Activity Tolerance: Patient tolerated treatment well Patient left: with call bell/phone within reach;in bed;with family/visitor present     Time: [id number] PT Time Calculation (min) (ACUTE ONLY): 15 min  Charges:  $Therapeutic Exercise: 8-22 mins  G Codes:      Salina April, PTA Pager: [phone]   06/10/2015, 8:50 AM

## 2015-06-10 NOTE — Care Management Important Message (Signed)
Important Message  Patient Details  Name: Danielle Garrett MRN: [id number] Date of Birth: 1950-05-25   Medicare Important Message Given:  Yes    Delorse Lek 06/10/2015, 4:35 PM

## 2015-06-10 NOTE — Progress Notes (Signed)
Pt ready for d/c home per MD. Cleared by PT/OT, has needed equipment at home. Discharge teaching and prescriptions reviewed with pt and her husband, they denied any questions. Belongings gathered and will be sent with pt's husband.   Danielle Garrett  06/10/2015

## 2015-06-11 DIAGNOSIS — I10 Essential (primary) hypertension: Secondary | ICD-10-CM | POA: Diagnosis not present

## 2015-06-11 DIAGNOSIS — I447 Left bundle-branch block, unspecified: Secondary | ICD-10-CM | POA: Diagnosis not present

## 2015-06-11 DIAGNOSIS — M1990 Unspecified osteoarthritis, unspecified site: Secondary | ICD-10-CM | POA: Diagnosis not present

## 2015-06-11 DIAGNOSIS — I209 Angina pectoris, unspecified: Secondary | ICD-10-CM | POA: Diagnosis not present

## 2015-06-11 DIAGNOSIS — F419 Anxiety disorder, unspecified: Secondary | ICD-10-CM | POA: Diagnosis not present

## 2015-06-11 DIAGNOSIS — Z471 Aftercare following joint replacement surgery: Secondary | ICD-10-CM | POA: Diagnosis not present

## 2015-06-14 DIAGNOSIS — I10 Essential (primary) hypertension: Secondary | ICD-10-CM | POA: Diagnosis not present

## 2015-06-14 DIAGNOSIS — Z471 Aftercare following joint replacement surgery: Secondary | ICD-10-CM | POA: Diagnosis not present

## 2015-06-14 DIAGNOSIS — F419 Anxiety disorder, unspecified: Secondary | ICD-10-CM | POA: Diagnosis not present

## 2015-06-14 DIAGNOSIS — M1990 Unspecified osteoarthritis, unspecified site: Secondary | ICD-10-CM | POA: Diagnosis not present

## 2015-06-14 DIAGNOSIS — I447 Left bundle-branch block, unspecified: Secondary | ICD-10-CM | POA: Diagnosis not present

## 2015-06-14 DIAGNOSIS — I209 Angina pectoris, unspecified: Secondary | ICD-10-CM | POA: Diagnosis not present

## 2015-06-16 DIAGNOSIS — I447 Left bundle-branch block, unspecified: Secondary | ICD-10-CM | POA: Diagnosis not present

## 2015-06-16 DIAGNOSIS — Z471 Aftercare following joint replacement surgery: Secondary | ICD-10-CM | POA: Diagnosis not present

## 2015-06-16 DIAGNOSIS — M1990 Unspecified osteoarthritis, unspecified site: Secondary | ICD-10-CM | POA: Diagnosis not present

## 2015-06-16 DIAGNOSIS — I209 Angina pectoris, unspecified: Secondary | ICD-10-CM | POA: Diagnosis not present

## 2015-06-16 DIAGNOSIS — F419 Anxiety disorder, unspecified: Secondary | ICD-10-CM | POA: Diagnosis not present

## 2015-06-16 DIAGNOSIS — I10 Essential (primary) hypertension: Secondary | ICD-10-CM | POA: Diagnosis not present

## 2015-06-17 DIAGNOSIS — Z471 Aftercare following joint replacement surgery: Secondary | ICD-10-CM | POA: Diagnosis not present

## 2015-06-17 DIAGNOSIS — I209 Angina pectoris, unspecified: Secondary | ICD-10-CM | POA: Diagnosis not present

## 2015-06-17 DIAGNOSIS — M1990 Unspecified osteoarthritis, unspecified site: Secondary | ICD-10-CM | POA: Diagnosis not present

## 2015-06-17 DIAGNOSIS — F419 Anxiety disorder, unspecified: Secondary | ICD-10-CM | POA: Diagnosis not present

## 2015-06-17 DIAGNOSIS — I10 Essential (primary) hypertension: Secondary | ICD-10-CM | POA: Diagnosis not present

## 2015-06-17 DIAGNOSIS — I447 Left bundle-branch block, unspecified: Secondary | ICD-10-CM | POA: Diagnosis not present

## 2015-06-18 DIAGNOSIS — Z471 Aftercare following joint replacement surgery: Secondary | ICD-10-CM | POA: Diagnosis not present

## 2015-06-18 DIAGNOSIS — M1990 Unspecified osteoarthritis, unspecified site: Secondary | ICD-10-CM | POA: Diagnosis not present

## 2015-06-18 DIAGNOSIS — I447 Left bundle-branch block, unspecified: Secondary | ICD-10-CM | POA: Diagnosis not present

## 2015-06-18 DIAGNOSIS — I10 Essential (primary) hypertension: Secondary | ICD-10-CM | POA: Diagnosis not present

## 2015-06-18 DIAGNOSIS — F419 Anxiety disorder, unspecified: Secondary | ICD-10-CM | POA: Diagnosis not present

## 2015-06-18 DIAGNOSIS — I209 Angina pectoris, unspecified: Secondary | ICD-10-CM | POA: Diagnosis not present

## 2015-06-21 ENCOUNTER — Encounter: Payer: Self-pay | Admitting: Physical Therapy

## 2015-06-21 ENCOUNTER — Ambulatory Visit: Payer: Medicare Other | Attending: Orthopedic Surgery | Admitting: Physical Therapy

## 2015-06-21 DIAGNOSIS — M25551 Pain in right hip: Secondary | ICD-10-CM | POA: Insufficient documentation

## 2015-06-21 DIAGNOSIS — R262 Difficulty in walking, not elsewhere classified: Secondary | ICD-10-CM | POA: Insufficient documentation

## 2015-06-21 DIAGNOSIS — Z9660 Presence of unspecified orthopedic joint implant: Secondary | ICD-10-CM | POA: Insufficient documentation

## 2015-06-21 DIAGNOSIS — Z96641 Presence of right artificial hip joint: Secondary | ICD-10-CM

## 2015-06-21 NOTE — Therapy (Signed)
[facility] [place], [place], [address] Phone: [phone]   Fax:  [phone]  Physical Therapy Evaluation  Patient Details  Name: Danielle Garrett MRN: [id number] Date of Birth: 11-19-1950 Referring Provider: Mayer Camel  Encounter Date: 06/21/2015      PT End of Session - 06/21/15 1341    Visit Number 1   Date for PT Re-Evaluation 08/21/15   PT Start Time 1301   PT Stop Time 1349   PT Time Calculation (min) 48 min   Activity Tolerance Patient tolerated treatment well   Behavior During Therapy [facility] for tasks assessed/performed      Past Medical History  Diagnosis Date  . Hypertension   . Gastroparesis   . Anxiety   . Pyloric stenosis   . Childhood asthma   . Chronic hoarseness   . Allergic rhinitis due to other allergen   . PONV (postoperative nausea and vomiting)   . Left bundle branch block      (abnormal heart rhythm)  . Anginal pain ([place]) 2011    hospitalized for CP in the past; due to low potassium  . GERD (gastroesophageal reflux disease)   . History of hiatal hernia   . Arthritis   . Hx of blood transfusion reaction     in the early 80s    Past Surgical History  Procedure Laterality Date  . Cardiac catheterization  10/2009    Cardiac cath normal coronary arteries  . Other surgical history  1983    hysterectomy ovaries intact, prolapsed   . Cervical disc arthroplasty      disc replacement in neck 1998  . Other surgical history  2006    pyloric stricture surgery  . Colonoscopy      10 year repeat 09/26/2010  . Esophagogastroduodenoscopy  09/26/2010,01/08/13  . Abdominal hysterectomy  1983  . 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  Visit Diagnosis:  Status post right hip replacement - Plan: PT plan of  care cert/re-cert  Right hip pain - Plan: PT plan of care cert/re-cert  Difficulty walking - Plan: PT plan of care cert/re-cert      Subjective Assessment - 06/21/15 1304    Subjective Patient underwent a right THR anterior approach on 06/07/15.  She had home PT until this past Friday.  She reports that she has been having pain down the right lateral thigh.  She reports that she is almost off of all narcotics.     Limitations Sitting;Lifting;Walking;Standing;House hold activities   Patient Stated Goals walk without any device and be back working as an Optometrist   Currently in Pain? Yes   Pain Score 3    Pain Location Hip   Pain Orientation Right;Anterior;Lateral   Pain Descriptors / Indicators Aching   Pain Type Surgical pain   Pain Onset 1 to 4 weeks ago   Pain Frequency Constant   Aggravating Factors  certain movements will increase pain to a 7/10   Pain Relieving Factors rest and Tylenol can have pain down to a 0/10   Effect of Pain on Daily Activities difficulty walking            [facility] PT Assessment - 06/21/15 0001    Assessment   Medical Diagnosis right THR anterior approach  Referring Provider Mayer Camel   Onset Date/Surgical Date 06/07/15   Prior Therapy home PT   Precautions   Precautions Anterior Hip   Restrictions   Weight Bearing Restrictions No   Balance Screen   Has the patient fallen in the past 6 months Yes   How many times? 1   Has the patient had a decrease in activity level because of a fear of falling?  No   Is the patient reluctant to leave their home because of a fear of falling?  No   Home Environment   Additional Comments lives alone, does her own houseworl   Prior Function   Level of Independence Independent   Vocation Part time employment   Psychologist, forensic   Leisure no exercise   Observation/Other Assessments-Edema    Edema --  some edema of the right LE's   AROM   Overall AROM Comments standing right hip flexion 50  degrees, abdcution 15 degrees, extension 5 degrees.   Strength   Overall Strength Comments right hip flexion 3+/5, abduction 3/5 due to pain, extension 3+/5   Palpation   Palpation comment she is very sore and tender in the right anterior hip and in the right lateral thigh especially distal ITB    Transfers   Comments needs to use hands to get up from sitting   Ambulation/Gait   Gait Comments uses a FWW, slow, small steps, step to with the left leg   Standardized Balance Assessment   Standardized Balance Assessment Timed Up and Go Test   Timed Up and Go Test   Normal TUG (seconds) 20                   [facility] Adult PT Treatment/Exercise - 06/21/15 0001    Exercises   Exercises Knee/Hip   Knee/Hip Exercises: Aerobic   Nustep Level 4 x 5 minutes   Knee/Hip Exercises: Machines for Strengthening   Cybex Knee Extension 5# 2x10   Cybex Knee Flexion 20# 2x15   Knee/Hip Exercises: Supine   Other Supine Knee/Hip Exercises feet on ball K2C, trunk rotation and bridges                PT Education - 06/21/15 1341    Education provided Yes   Education Details went over gentel scar and STM of the right thigh   Person(s) Educated Patient   Methods Explanation;Demonstration   Comprehension Verbalized understanding          PT Short Term Goals - 06/21/15 1348    PT SHORT TERM GOAL #1   Title independent with scar massage   Time 2   Period Weeks   Status New           PT Long Term Goals - 06/21/15 1348    PT LONG TERM GOAL #1   Title Walk 300 feet without assistive device   Time 8   Period Weeks   Status New   PT LONG TERM GOAL #2   Title report pain decreased 50%   Time 8   Period Weeks   Status New   PT LONG TERM GOAL #3   Title increase strength of the right hip to 4/5   Time 8   Period Weeks   Status New   PT LONG TERM GOAL #4   Title get up from sitting wihtout hands   Time 8   Period Weeks   Status New   PT LONG TERM GOAL #5   Title  return  to work 4 hours a day   Time [place] - July 14, 2015 1342    Clinical Impression Statement Patient with a right THR on 06/07/15, anterior approach.  She takes very small steps, c/o pain in hte right anterior hip the right thigh, medial and lateral knee area.  Reports that she has been doing SLR's at home and that it is hurting more recently   Pt will benefit from skilled therapeutic intervention in order to improve on the following deficits Abnormal gait;Decreased activity tolerance;Decreased balance;Decreased mobility;Decreased range of motion;Decreased scar mobility;Decreased strength;Increased edema;Difficulty walking;Pain   Rehab Potential Good   PT Frequency 3x / week   PT Duration 8 weeks   PT Treatment/Interventions ADLs/Self Care Home Management;Electrical Stimulation;Cryotherapy;Stair training;Gait training;Functional mobility training;Therapeutic activities;Therapeutic exercise;Manual techniques;Patient/family education;Balance training;Passive range of motion;Vasopneumatic Device   PT Next Visit Plan slowly add exercises for strength and function   Consulted and Agree with Plan of Care Patient          G-Codes - 2015-07-14 1352    Functional Assessment Tool Used Foto 71% limitation   Functional Limitation Mobility: Walking and moving around   Mobility: Walking and Moving Around Current Status [phone]) At least 60 percent but less than 80 percent impaired, limited or restricted   Mobility: Walking and Moving Around Goal Status [phone]) At least 40 percent but less than 60 percent impaired, limited or restricted       Problem List Patient Active Problem List   Diagnosis Date Noted  . Primary osteoarthritis of right hip 06/05/2015  . Pre-operative clearance 06/02/2015  . Chest pain 06/02/2015  . LBBB (left bundle branch block) 06/02/2015  . History of left hip replacement 06/02/2015  . Palpitation 10/07/2014    Sumner Boast., PT Jul 14, 2015, 1:56 PM  [place], [place], [address] Phone: [phone]   Fax:  [phone]  Name: Jameela Schmucker MRN: [id number] Date of Birth: 1950-05-12

## 2015-06-22 DIAGNOSIS — Z96641 Presence of right artificial hip joint: Secondary | ICD-10-CM | POA: Diagnosis not present

## 2015-06-22 DIAGNOSIS — Z471 Aftercare following joint replacement surgery: Secondary | ICD-10-CM | POA: Diagnosis not present

## 2015-06-24 ENCOUNTER — Ambulatory Visit: Payer: Medicare Other | Admitting: Physical Therapy

## 2015-06-24 ENCOUNTER — Encounter: Payer: Self-pay | Admitting: Physical Therapy

## 2015-06-24 DIAGNOSIS — Z9660 Presence of unspecified orthopedic joint implant: Secondary | ICD-10-CM | POA: Diagnosis not present

## 2015-06-24 DIAGNOSIS — Z96641 Presence of right artificial hip joint: Secondary | ICD-10-CM

## 2015-06-24 DIAGNOSIS — M25551 Pain in right hip: Secondary | ICD-10-CM | POA: Diagnosis not present

## 2015-06-24 DIAGNOSIS — R262 Difficulty in walking, not elsewhere classified: Secondary | ICD-10-CM | POA: Diagnosis not present

## 2015-06-24 NOTE — Therapy (Signed)
[place], [place], [address] Phone: [phone]   Fax:  [phone]  Physical Therapy Treatment  Patient Details  Name: Danielle Garrett MRN: [id number] Date of Birth: [birth year] Referring Provider: Mayer Camel  Encounter Date: 06/24/2015      PT End of Session - 06/24/15 1141    Visit Number 2   Date for PT Re-Evaluation 08/21/15   PT Start Time 1059   PT Stop Time 1154   PT Time Calculation (min) 55 min   Activity Tolerance Patient tolerated treatment well   Behavior During Therapy [facility] for tasks assessed/performed      Past Medical History  Diagnosis Date  . Hypertension   . Gastroparesis   . Anxiety   . Pyloric stenosis   . Childhood asthma   . Chronic hoarseness   . Allergic rhinitis due to other allergen   . PONV (postoperative nausea and vomiting)   . Left bundle branch block      (abnormal heart rhythm)  . Anginal pain ([place]) 2011    hospitalized for CP in the past; due to low potassium  . GERD (gastroesophageal reflux disease)   . History of hiatal hernia   . Arthritis   . Hx of blood transfusion reaction     in the early 80s    Past Surgical History  Procedure Laterality Date  . Cardiac catheterization  10/2009    Cardiac cath normal coronary arteries  . Other surgical history  1983    hysterectomy ovaries intact, prolapsed   . Cervical disc arthroplasty      disc replacement in neck 1998  . Other surgical history  2006    pyloric stricture surgery  . Colonoscopy      10 year repeat 09/26/2010  . Esophagogastroduodenoscopy  09/26/2010,01/08/13  . Abdominal hysterectomy  1983  . 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  Visit Diagnosis:  Status post right hip replacement  Right hip  pain  Difficulty walking      Subjective Assessment - 06/24/15 1059    Subjective "Im great, but I have a few concerns" Pt reports that she has had some R knee pain and a bruise on it. Pt thinks that the bruise come from the way they handled her during surgery. Pt also reports a sharp groin pain that comes at times .   Currently in Pain? Yes   Pain Score 1    Pain Location Hip   Pain Orientation Right;Anterior                         [facility] Adult PT Treatment/Exercise - 06/24/15 0001    Ambulation/Gait   Ambulation/Gait Yes   Ambulation/Gait Assistance 5: Supervision   Ambulation/Gait Assistance Details Slight R hip abd during stance phase with RLE    Ambulation Distance (Feet) 120 Feet   Assistive device None   Gait Pattern Step-through pattern;Step-to pattern   Ambulation Surface Level;Indoor   Exercises   Exercises Knee/Hip   Knee/Hip Exercises: Aerobic   Nustep Level 3 x 6 minutes   Knee/Hip Exercises: Machines for Strengthening   Cybex Knee Extension 5# 2x15   Cybex Knee Flexion 20# 2x15   Cybex Leg Press 2x10 no weight  Knee/Hip Exercises: Standing   Hip Flexion 20 reps;Knee bent;Right   Hip Flexion Limitations 2   Hip Abduction 20 reps;Knee straight;Right   Abduction Limitations 2   Hip Extension 1 set;20 reps;Right   Knee/Hip Exercises: Seated   Ball Squeeze 2x10    Marching 2 sets;10 reps;Both   Abduction/Adduction  2 sets;10 reps   Abd/Adduction Limitations manual resistance    Sit to Sand 2 sets;10 reps;without UE support   Knee/Hip Exercises: Supine   Quad Sets 10 reps;Right   Heel Slides Right;10 reps   Bridges 10 reps   Modalities   Modalities Cryotherapy;Electrical Stimulation   Cryotherapy   Number Minutes Cryotherapy 15 Minutes   Cryotherapy Location Knee   Type of Cryotherapy Ice pack   Electrical Stimulation   Electrical Stimulation Location knee   Electrical Stimulation Action IFC   Electrical Stimulation Parameters  tolerance   Electrical Stimulation Goals Pain                  PT Short Term Goals - 06/21/15 1348    PT SHORT TERM GOAL #1   Title independent with scar massage   Time 2   Period Weeks   Status New           PT Long Term Goals - 06/21/15 1348    PT LONG TERM GOAL #1   Title Walk 300 feet without assistive device   Time 8   Period Weeks   Status New   PT LONG TERM GOAL #2   Title report pain decreased 50%   Time 8   Period Weeks   Status New   PT LONG TERM GOAL #3   Title increase strength of the right hip to 4/5   Time 8   Period Weeks   Status New   PT LONG TERM GOAL #4   Title get up from sitting wihtout hands   Time 8   Period Weeks   Status New   PT LONG TERM GOAL #5   Title return to work 4 hours a day   Time 8   Period Weeks   Status New               Plan - 06/24/15 1143    Clinical Impression Statement Pt performed well in PT this date. PT reports that she has been having medial R knee pain. Pt expresses concern about let press machine but was fine after some reassurance. Weighted R hip flexion increase R knee pain in VMO area.    Pt will benefit from skilled therapeutic intervention in order to improve on the following deficits Abnormal gait;Decreased activity tolerance;Decreased balance;Decreased mobility;Decreased range of motion;Decreased scar mobility;Decreased strength;Increased edema;Difficulty walking;Pain   Rehab Potential Good   PT Frequency 3x / week   PT Duration 8 weeks   PT Treatment/Interventions ADLs/Self Care Home Management;Electrical Stimulation;Cryotherapy;Stair training;Gait training;Functional mobility training;Therapeutic activities;Therapeutic exercise;Manual techniques;Patient/family education;Balance training;Passive range of motion;Vasopneumatic Device   PT Next Visit Plan slowly add exercises for strength and function        Problem List Patient Active Problem List   Diagnosis Date Noted  . Primary  osteoarthritis of right hip 06/05/2015  . Pre-operative clearance 06/02/2015  . Chest pain 06/02/2015  . LBBB (left bundle branch block) 06/02/2015  . History of left hip replacement 06/02/2015  . Palpitation 10/07/2014    Scot Jun, PTA  06/24/2015, 11:47 AM  [place]  [place], [place], [address] Phone: [phone]   Fax:  [phone]  Name: Danielle Garrett MRN: [id number] Date of Birth: [birth year]

## 2015-06-25 ENCOUNTER — Ambulatory Visit: Payer: Medicare Other | Admitting: Physical Therapy

## 2015-06-28 ENCOUNTER — Encounter: Payer: Self-pay | Admitting: Physical Therapy

## 2015-06-28 ENCOUNTER — Ambulatory Visit: Payer: Medicare Other | Admitting: Physical Therapy

## 2015-06-28 DIAGNOSIS — R262 Difficulty in walking, not elsewhere classified: Secondary | ICD-10-CM | POA: Diagnosis not present

## 2015-06-28 DIAGNOSIS — M25551 Pain in right hip: Secondary | ICD-10-CM

## 2015-06-28 DIAGNOSIS — Z9660 Presence of unspecified orthopedic joint implant: Secondary | ICD-10-CM | POA: Diagnosis not present

## 2015-06-28 DIAGNOSIS — Z96641 Presence of right artificial hip joint: Secondary | ICD-10-CM

## 2015-06-28 NOTE — Therapy (Signed)
[place], [place], [address] Phone: [phone]   Fax:  [phone]  Physical Therapy Treatment  Patient Details  Name: Danielle Garrett MRN: [id number] Date of Birth: 1951/03/14 Referring Provider: Mayer Camel  Encounter Date: 06/28/2015      PT End of Session - 06/28/15 1142    Visit Number 3   Date for PT Re-Evaluation 08/21/15   PT Start Time 1059   PT Stop Time 1143   PT Time Calculation (min) 44 min   Activity Tolerance Patient tolerated treatment well   Behavior During Therapy [facility] for tasks assessed/performed      Past Medical History  Diagnosis Date  . Hypertension   . Gastroparesis   . Anxiety   . Pyloric stenosis   . Childhood asthma   . Chronic hoarseness   . Allergic rhinitis due to other allergen   . PONV (postoperative nausea and vomiting)   . Left bundle branch block      (abnormal heart rhythm)  . Anginal pain ([place]) 2011    hospitalized for CP in the past; due to low potassium  . GERD (gastroesophageal reflux disease)   . History of hiatal hernia   . Arthritis   . Hx of blood transfusion reaction     in the early 80s    Past Surgical History  Procedure Laterality Date  . Cardiac catheterization  10/2009    Cardiac cath normal coronary arteries  . Other surgical history  1983    hysterectomy ovaries intact, prolapsed   . Cervical disc arthroplasty      disc replacement in neck 1998  . Other surgical history  2006    pyloric stricture surgery  . Colonoscopy      10 year repeat 09/26/2010  . Esophagogastroduodenoscopy  09/26/2010,01/08/13  . Abdominal hysterectomy  1983  . Total hip arthroplasty Right 06/07/2015    Procedure: TOTAL HIP ARTHROPLASTY ANTERIOR APPROACH;  Surgeon: Frederik Pear, MD;  Location: [facility];  Service: Orthopedics;  Laterality: Right;    There were no vitals filed for this visit.  Visit Diagnosis:  Status post right hip replacement  Right hip  pain  Difficulty walking      Subjective Assessment - 06/28/15 1101    Subjective "Ive had a terrible few days" Pt reports that she has gotten a new recliner that didn't not have her siting with a lot of weight on her hip. Pt reports she has pain when she walks.    Pain Score 1/10 R hip                         [facility] Adult PT Treatment/Exercise - 06/28/15 0001    High Level Balance   High Level Balance Activities Side stepping;Backward walking   Exercises   Exercises Knee/Hip   Knee/Hip Exercises: Aerobic   Nustep Level 3 x 6 minutes   Knee/Hip Exercises: Machines for Strengthening   Cybex Knee Extension 5# 2x15   Cybex Knee Flexion 20# 2x15   Knee/Hip Exercises: Standing   Forward Step Up 2 sets;Both;Hand Hold: 1;10 reps   Other Standing Knee Exercises Sit to stand with red ball 2x10    Knee/Hip Exercises: Seated   Long Arc Quad 2 sets;10 reps;Both;Weights   Long Arc Quad Weight 2 lbs.   Ball Squeeze 2x15   Marching 2 sets;10 reps;Both   Marching Weights 2 lbs.   Abduction/Adduction  2 sets;10 reps  Abd/Adduction Limitations manual resistance                   PT Short Term Goals - 06/21/15 1348    PT SHORT TERM GOAL #1   Title independent with scar massage   Time 2   Period Weeks   Status New           PT Long Term Goals - 06/21/15 1348    PT LONG TERM GOAL #1   Title Walk 300 feet without assistive device   Time 8   Period Weeks   Status New   PT LONG TERM GOAL #2   Title report pain decreased 50%   Time 8   Period Weeks   Status New   PT LONG TERM GOAL #3   Title increase strength of the right hip to 4/5   Time 8   Period Weeks   Status New   PT LONG TERM GOAL #4   Title get up from sitting wihtout hands   Time 8   Period Weeks   Status New   PT LONG TERM GOAL #5   Title return to work 4 hours a day   Time 8   Period Weeks   Status New               Plan - 06/28/15 1143    Clinical Impression Statement  Pt tolerated treatment well, pt stated that she was walking with a RW for 2 months before surgery and was not putting weight on RLE. Pt does has 2 bout of sharp pain in R groin area  and R knee.  Pt able to progress to step up with $ inch step.   Pt will benefit from skilled therapeutic intervention in order to improve on the following deficits Abnormal gait;Decreased activity tolerance;Decreased balance;Decreased mobility;Decreased range of motion;Decreased scar mobility;Decreased strength;Increased edema;Difficulty walking;Pain   Rehab Potential Good   PT Frequency 3x / week   PT Duration 8 weeks   PT Treatment/Interventions ADLs/Self Care Home Management;Electrical Stimulation;Cryotherapy;Stair training;Gait training;Functional mobility training;Therapeutic activities;Therapeutic exercise;Manual techniques;Patient/family education;Balance training;Passive range of motion;Vasopneumatic Device   PT Next Visit Plan slowly add exercises for strength and function        Problem List Patient Active Problem List   Diagnosis Date Noted  . Primary osteoarthritis of right hip 06/05/2015  . Pre-operative clearance 06/02/2015  . Chest pain 06/02/2015  . LBBB (left bundle branch block) 06/02/2015  . History of left hip replacement 06/02/2015  . Palpitation 10/07/2014    Scot Jun, PTA  06/28/2015, 11:52 AM  [place], [place], [address] Phone: [phone]   Fax:  [phone]  Name: Jakirra Onofre MRN: [id number] Date of Birth: 11/05/1950

## 2015-06-30 ENCOUNTER — Ambulatory Visit: Payer: Medicare Other | Admitting: Physical Therapy

## 2015-06-30 ENCOUNTER — Encounter: Payer: Self-pay | Admitting: Physical Therapy

## 2015-06-30 DIAGNOSIS — M25551 Pain in right hip: Secondary | ICD-10-CM | POA: Diagnosis not present

## 2015-06-30 DIAGNOSIS — R262 Difficulty in walking, not elsewhere classified: Secondary | ICD-10-CM

## 2015-06-30 DIAGNOSIS — Z9660 Presence of unspecified orthopedic joint implant: Secondary | ICD-10-CM | POA: Diagnosis not present

## 2015-06-30 DIAGNOSIS — Z96641 Presence of right artificial hip joint: Secondary | ICD-10-CM

## 2015-06-30 NOTE — Therapy (Signed)
[place], [place], [address] Phone: [phone]   Fax:  [phone]  Physical Therapy Treatment  Patient Details  Name: Danielle Garrett MRN: [id number] Date of Birth: 08-10-1950 Referring Provider: Mayer Camel  Encounter Date: 06/30/2015      PT End of Session - 06/30/15 1143    Visit Number 4   Date for PT Re-Evaluation 08/21/15   PT Start Time 1100   PT Stop Time 1143   PT Time Calculation (min) 43 min      Past Medical History  Diagnosis Date  . Hypertension   . Gastroparesis   . Anxiety   . Pyloric stenosis   . Childhood asthma   . Chronic hoarseness   . Allergic rhinitis due to other allergen   . PONV (postoperative nausea and vomiting)   . Left bundle branch block      (abnormal heart rhythm)  . Anginal pain ([place]) 2011    hospitalized for CP in the past; due to low potassium  . GERD (gastroesophageal reflux disease)   . History of hiatal hernia   . Arthritis   . Hx of blood transfusion reaction     in the early 80s    Past Surgical History  Procedure Laterality Date  . Cardiac catheterization  10/2009    Cardiac cath normal coronary arteries  . Other surgical history  1983    hysterectomy ovaries intact, prolapsed   . Cervical disc arthroplasty      disc replacement in neck 1998  . Other surgical history  2006    pyloric stricture surgery  . Colonoscopy      10 year repeat 09/26/2010  . Esophagogastroduodenoscopy  09/26/2010,01/08/13  . Abdominal hysterectomy  1983  . Total hip arthroplasty Right 06/07/2015    Procedure: TOTAL HIP ARTHROPLASTY ANTERIOR APPROACH;  Surgeon: Frederik Pear, MD;  Location: [facility];  Service: Orthopedics;  Laterality: Right;    There were no vitals filed for this visit.  Visit Diagnosis:  Status post right hip replacement  Right hip pain  Difficulty walking      Subjective Assessment - 06/30/15 1100    Subjective "Im doing good" I felt good  enough to drive today.   Currently in Pain? Yes   Pain Score 1    Pain Location Hip   Pain Descriptors / Indicators Aching                         [facility] Adult PT Treatment/Exercise - 06/30/15 0001    Ambulation/Gait   Ambulation/Gait Yes   Ambulation/Gait Assistance 6: Modified independent (Device/Increase time)   Ambulation/Gait Assistance Details little R hip abd during stance phase   Ambulation Distance (Feet) 120 Feet   Assistive device None   Gait Pattern Step-through pattern;Step-to pattern   Ambulation Surface Level;Indoor   High Level Balance   High Level Balance Activities Side stepping;Backward walking;Marching forwards   Exercises   Exercises Knee/Hip   Knee/Hip Exercises: Aerobic   Nustep Level 3 x 7 minutes   Knee/Hip Exercises: Machines for Strengthening   Cybex Knee Extension #5 3x10   Cybex Knee Flexion 25# 3x10   Knee/Hip Exercises: Standing   Forward Step Up 2 sets;Both;Hand Hold: 1;10 reps;Step Height: 6"   Other Standing Knee Exercises sport cord walking #10 x3   Knee/Hip Exercises: Seated   Ball Squeeze 2x15   Sit to Sand 2 sets;10 reps;without UE support  2nd set with red weighted ball                  PT Short Term Goals - 06/21/15 1348    PT SHORT TERM GOAL #1   Title independent with scar massage   Time 2   Period Weeks   Status New           PT Long Term Goals - 06/21/15 1348    PT LONG TERM GOAL #1   Title Walk 300 feet without assistive device   Time 8   Period Weeks   Status New   PT LONG TERM GOAL #2   Title report pain decreased 50%   Time 8   Period Weeks   Status New   PT LONG TERM GOAL #3   Title increase strength of the right hip to 4/5   Time 8   Period Weeks   Status New   PT LONG TERM GOAL #4   Title get up from sitting wihtout hands   Time 8   Period Weeks   Status New   PT LONG TERM GOAL #5   Title return to work 4 hours a day   Time 8   Period Weeks   Status New                Plan - 06/30/15 1143    Clinical Impression Statement Pt continues to progress well, does compensate with sit to stands and demos R hip weakness with resisted walking. Reports some R hip pain with forward marching, none  noted with forward step ups.   Pt will benefit from skilled therapeutic intervention in order to improve on the following deficits Abnormal gait;Decreased activity tolerance;Decreased balance;Decreased mobility;Decreased range of motion;Decreased scar mobility;Decreased strength;Increased edema;Difficulty walking;Pain   Rehab Potential Good   PT Frequency 3x / week   PT Duration 8 weeks   PT Treatment/Interventions ADLs/Self Care Home Management;Electrical Stimulation;Cryotherapy;Stair training;Gait training;Functional mobility training;Therapeutic activities;Therapeutic exercise;Manual techniques;Patient/family education;Balance training;Passive range of motion;Vasopneumatic Device   PT Next Visit Plan functional strengthening        Problem List Patient Active Problem List   Diagnosis Date Noted  . Primary osteoarthritis of right hip 06/05/2015  . Pre-operative clearance 06/02/2015  . Chest pain 06/02/2015  . LBBB (left bundle branch block) 06/02/2015  . History of left hip replacement 06/02/2015  . Palpitation 10/07/2014    Scot Jun, PTA  06/30/2015, 11:45 AM  [place], [place], [address] Phone: [phone]   Fax:  [phone]  Name: Keyara Bills MRN: [id number] Date of Birth: 10-07-1950

## 2015-07-02 ENCOUNTER — Ambulatory Visit: Payer: Medicare Other | Admitting: Physical Therapy

## 2015-07-02 ENCOUNTER — Encounter: Payer: Self-pay | Admitting: Physical Therapy

## 2015-07-02 DIAGNOSIS — M25551 Pain in right hip: Secondary | ICD-10-CM

## 2015-07-02 DIAGNOSIS — Z96641 Presence of right artificial hip joint: Secondary | ICD-10-CM

## 2015-07-02 DIAGNOSIS — Z9660 Presence of unspecified orthopedic joint implant: Secondary | ICD-10-CM | POA: Diagnosis not present

## 2015-07-02 DIAGNOSIS — R262 Difficulty in walking, not elsewhere classified: Secondary | ICD-10-CM

## 2015-07-02 DIAGNOSIS — K644 Residual hemorrhoidal skin tags: Secondary | ICD-10-CM | POA: Diagnosis not present

## 2015-07-02 DIAGNOSIS — L304 Erythema intertrigo: Secondary | ICD-10-CM | POA: Diagnosis not present

## 2015-07-02 NOTE — Therapy (Signed)
[place], [place], [address] Phone: [phone]   Fax:  [phone]  Physical Therapy Treatment  Patient Details  Name: Danielle Garrett MRN: [id number] Date of Birth: 13-Oct-1950 Referring Provider: Mayer Camel  Encounter Date: 07/02/2015      PT End of Session - 07/02/15 1144    Visit Number 5   Date for PT Re-Evaluation 08/21/15   PT Start Time 1100   PT Stop Time 1144   PT Time Calculation (min) 44 min   Activity Tolerance Patient tolerated treatment well   Behavior During Therapy [facility] for tasks assessed/performed      Past Medical History  Diagnosis Date  . Hypertension   . Gastroparesis   . Anxiety   . Pyloric stenosis   . Childhood asthma   . Chronic hoarseness   . Allergic rhinitis due to other allergen   . PONV (postoperative nausea and vomiting)   . Left bundle branch block      (abnormal heart rhythm)  . Anginal pain ([place]) 2011    hospitalized for CP in the past; due to low potassium  . GERD (gastroesophageal reflux disease)   . History of hiatal hernia   . Arthritis   . Hx of blood transfusion reaction     in the early 80s    Past Surgical History  Procedure Laterality Date  . Cardiac catheterization  10/2009    Cardiac cath normal coronary arteries  . Other surgical history  1983    hysterectomy ovaries intact, prolapsed   . Cervical disc arthroplasty      disc replacement in neck 1998  . Other surgical history  2006    pyloric stricture surgery  . Colonoscopy      10 year repeat 09/26/2010  . Esophagogastroduodenoscopy  09/26/2010,01/08/13  . Abdominal hysterectomy  1983  . Total hip arthroplasty Right 06/07/2015    Procedure: TOTAL HIP ARTHROPLASTY ANTERIOR APPROACH;  Surgeon: Frederik Pear, MD;  Location: [facility];  Service: Orthopedics;  Laterality: Right;    There were no vitals filed for this visit.  Visit Diagnosis:  Status post right hip replacement  Right hip  pain  Difficulty walking      Subjective Assessment - 07/02/15 1113    Subjective "I was doing really really well yesterday, I did laundry, cooked dinner and swifter the entire house" Pt reports that has slept on her R side last night and now is having pain down her R side. Pt also reports pain around both sides of R knee.     Currently in Pain? Yes   Pain Score 4    Pain Location Knee  Hip   Pain Orientation Right                         [facility] Adult PT Treatment/Exercise - 07/02/15 0001    High Level Balance   High Level Balance Activities Side stepping;Backward walking   Knee/Hip Exercises: Aerobic   Nustep Level 3 x 7 minutes   Knee/Hip Exercises: Machines for Strengthening   Cybex Knee Extension #5 3x10   Cybex Knee Flexion 20# 2x15   Knee/Hip Exercises: Standing   Lateral Step Up 2 sets;Left;10 reps;Hand Hold: 0   Forward Step Up 2 sets;Both;Hand Hold: 1;10 reps;Step Height: 6"   Other Standing Knee Exercises Standing march 2x10    Knee/Hip Exercises: Seated   Ball Squeeze 2x15   Abduction/Adduction  2 sets;10  reps   Abd/Adduction Limitations manual resistance    Sit to Sand 2 sets;without UE support;15 reps  with red weighted ball.                  PT Short Term Goals - 06/21/15 1348    PT SHORT TERM GOAL #1   Title independent with scar massage   Time 2   Period Weeks   Status New           PT Long Term Goals - 06/21/15 1348    PT LONG TERM GOAL #1   Title Walk 300 feet without assistive device   Time 8   Period Weeks   Status New   PT LONG TERM GOAL #2   Title report pain decreased 50%   Time 8   Period Weeks   Status New   PT LONG TERM GOAL #3   Title increase strength of the right hip to 4/5   Time 8   Period Weeks   Status New   PT LONG TERM GOAL #4   Title get up from sitting wihtout hands   Time 8   Period Weeks   Status New   PT LONG TERM GOAL #5   Title return to work 4 hours a day   Time 8   Period  Weeks   Status New               Plan - 07/02/15 1145    Clinical Impression Statement Continues to do well during PT. Pt reports a minor set back with pain. Pt advises not to do too much too soon even if she is feeling well. Mild foot clarence issues with step up interventions but able to catch herself without pain. Reports that standing hip flexion causes some pain but not in sitting.   Pt will benefit from skilled therapeutic intervention in order to improve on the following deficits Abnormal gait;Decreased activity tolerance;Decreased balance;Decreased mobility;Decreased range of motion;Decreased scar mobility;Decreased strength;Increased edema;Difficulty walking;Pain   Rehab Potential Good   PT Frequency 3x / week   PT Duration 8 weeks   PT Treatment/Interventions ADLs/Self Care Home Management;Electrical Stimulation;Cryotherapy;Stair training;Gait training;Functional mobility training;Therapeutic activities;Therapeutic exercise;Manual techniques;Patient/family education;Balance training;Passive range of motion;Vasopneumatic Device   PT Next Visit Plan functional strengthening        Problem List Patient Active Problem List   Diagnosis Date Noted  . Primary osteoarthritis of right hip 06/05/2015  . Pre-operative clearance 06/02/2015  . Chest pain 06/02/2015  . LBBB (left bundle branch block) 06/02/2015  . History of left hip replacement 06/02/2015  . Palpitation 10/07/2014    Scot Jun, PTA  07/02/2015, 11:48 AM  [place], [place], [address] Phone: [phone]   Fax:  [phone]  Name: Danielle Garrett MRN: [id number] Date of Birth: Mar 04, 1951

## 2015-07-05 ENCOUNTER — Ambulatory Visit: Payer: Medicare Other | Admitting: Physical Therapy

## 2015-07-05 ENCOUNTER — Encounter: Payer: Self-pay | Admitting: Physical Therapy

## 2015-07-05 DIAGNOSIS — Z9660 Presence of unspecified orthopedic joint implant: Secondary | ICD-10-CM | POA: Diagnosis not present

## 2015-07-05 DIAGNOSIS — M25551 Pain in right hip: Secondary | ICD-10-CM

## 2015-07-05 DIAGNOSIS — R262 Difficulty in walking, not elsewhere classified: Secondary | ICD-10-CM | POA: Diagnosis not present

## 2015-07-05 DIAGNOSIS — Z96641 Presence of right artificial hip joint: Secondary | ICD-10-CM

## 2015-07-05 NOTE — Therapy (Signed)
[place], [place], [address] Phone: [phone]   Fax:  [phone]  Physical Therapy Treatment  Patient Details  Name: Danielle Garrett MRN: [id number] Date of Birth: 1950-12-30 Referring Provider: Mayer Camel  Encounter Date: 07/05/2015      PT End of Session - 07/05/15 1554    Visit Number 6   Date for PT Re-Evaluation 08/21/15   PT Start Time 9381   PT Stop Time 1557   PT Time Calculation (min) 42 min   Activity Tolerance Patient tolerated treatment well   Behavior During Therapy [facility] for tasks assessed/performed      Past Medical History  Diagnosis Date  . Hypertension   . Gastroparesis   . Anxiety   . Pyloric stenosis   . Childhood asthma   . Chronic hoarseness   . Allergic rhinitis due to other allergen   . PONV (postoperative nausea and vomiting)   . Left bundle branch block      (abnormal heart rhythm)  . Anginal pain ([place]) 2011    hospitalized for CP in the past; due to low potassium  . GERD (gastroesophageal reflux disease)   . History of hiatal hernia   . Arthritis   . Hx of blood transfusion reaction     in the early 80s    Past Surgical History  Procedure Laterality Date  . Cardiac catheterization  10/2009    Cardiac cath normal coronary arteries  . Other surgical history  1983    hysterectomy ovaries intact, prolapsed   . Cervical disc arthroplasty      disc replacement in neck 1998  . Other surgical history  2006    pyloric stricture surgery  . Colonoscopy      10 year repeat 09/26/2010  . Esophagogastroduodenoscopy  09/26/2010,01/08/13  . Abdominal hysterectomy  1983  . Total hip arthroplasty Right 06/07/2015    Procedure: TOTAL HIP ARTHROPLASTY ANTERIOR APPROACH;  Surgeon: Frederik Pear, MD;  Location: [facility];  Service: Orthopedics;  Laterality: Right;    There were no vitals filed for this visit.  Visit Diagnosis:  Status post right hip replacement  Right hip  pain  Difficulty walking      Subjective Assessment - 07/05/15 1518    Subjective "I over did it, I worked ,went to [employer], and went to the drug store. Now Im hurt all over "   Currently in Pain? Yes   Pain Score 3    Pain Location --  Groin and outer thigh   Pain Orientation Right            [facility] PT Assessment - 07/05/15 0001    AROM   Overall AROM Comments standing right hip flexion 65 degrees, abdcution 39 degrees, extension 25 degrees.                     [facility] Adult PT Treatment/Exercise - 07/05/15 0001    Ambulation/Gait   Stairs Yes   Stairs Assistance 7: Independent;6: Modified independent (Device/Increase time)   Stair Management Technique One rail Right;Alternating pattern   Number of Stairs 12   Height of Stairs 6   Gait Comments x2    Exercises   Exercises Knee/Hip   Knee/Hip Exercises: Aerobic   Nustep Level 3 x 7 minutes   Knee/Hip Exercises: Machines for Strengthening   Cybex Knee Extension #5 3x10   Cybex Knee Flexion 25# 2x10   Cybex Leg Press #  20 2x10, RLE only no weight x10    Knee/Hip Exercises: Standing   Hip Abduction Both;2 sets;10 reps;Knee straight   Abduction Limitations 3   Hip Extension 20 reps;Right;2 sets;10 reps;Knee straight   Extension Limitations 3   Knee/Hip Exercises: Seated   Sit to Sand 2 sets;without UE support;10 reps  blue weighted ball                   PT Short Term Goals - 07/05/15 1547    PT SHORT TERM GOAL #1   Title independent with scar massage   Status Achieved           PT Long Term Goals - 07/05/15 1547    PT LONG TERM GOAL #1   Title Walk 300 feet without assistive device   Status Achieved   PT LONG TERM GOAL #2   Title report pain decreased 50%   Status Achieved   PT LONG TERM GOAL #4   Title get up from sitting wihtout hands   Status Achieved   PT LONG TERM GOAL #5   Title return to work 4 hours a day   Status Achieved               Plan -  07/05/15 1556    Clinical Impression Statement Again pt continues to do well and has met som goals. Reports some anterior hip pain when ascending the stairs. Pt able to perform leg press intervention with resistance but requires max encouragement.   Pt will benefit from skilled therapeutic intervention in order to improve on the following deficits Abnormal gait;Decreased activity tolerance;Decreased balance;Decreased mobility;Decreased range of motion;Decreased scar mobility;Decreased strength;Increased edema;Difficulty walking;Pain   Rehab Potential Good   PT Frequency 3x / week   PT Duration 8 weeks   PT Treatment/Interventions ADLs/Self Care Home Management;Electrical Stimulation;Cryotherapy;Stair training;Gait training;Functional mobility training;Therapeutic activities;Therapeutic exercise;Manual techniques;Patient/family education;Balance training;Passive range of motion;Vasopneumatic Device   PT Next Visit Plan functional strengthening        Problem List Patient Active Problem List   Diagnosis Date Noted  . Primary osteoarthritis of right hip 06/05/2015  . Pre-operative clearance 06/02/2015  . Chest pain 06/02/2015  . LBBB (left bundle branch block) 06/02/2015  . History of left hip replacement 06/02/2015  . Palpitation 10/07/2014    Scot Jun, PTA  07/05/2015, 4:03 PM  [place], [place], [address] Phone: [phone]   Fax:  [phone]  Name: Danielle Garrett MRN: [id number] Date of Birth: [birth year]

## 2015-07-07 ENCOUNTER — Ambulatory Visit: Payer: Medicare Other | Admitting: Physical Therapy

## 2015-07-07 ENCOUNTER — Encounter: Payer: Self-pay | Admitting: Physical Therapy

## 2015-07-07 DIAGNOSIS — R262 Difficulty in walking, not elsewhere classified: Secondary | ICD-10-CM

## 2015-07-07 DIAGNOSIS — Z96641 Presence of right artificial hip joint: Secondary | ICD-10-CM

## 2015-07-07 DIAGNOSIS — Z9660 Presence of unspecified orthopedic joint implant: Secondary | ICD-10-CM | POA: Diagnosis not present

## 2015-07-07 DIAGNOSIS — M25551 Pain in right hip: Secondary | ICD-10-CM | POA: Diagnosis not present

## 2015-07-07 NOTE — Therapy (Addendum)
[place], [place], [address] Phone: [phone]   Fax:  [phone]  Physical Therapy Treatment  Patient Details  Name: Danielle Garrett MRN: [id number] Date of Birth: Jun 17, 1950 Referring Provider: Mayer Camel  Encounter Date: 07/07/2015      PT End of Session - 07/07/15 1533    Visit Number 7   Date for PT Re-Evaluation 08/21/15   PT Start Time 5102   PT Stop Time 1533   PT Time Calculation (min) 18 min   Activity Tolerance Patient tolerated treatment well   Behavior During Therapy [facility] for tasks assessed/performed      Past Medical History  Diagnosis Date  . Hypertension   . Gastroparesis   . Anxiety   . Pyloric stenosis   . Childhood asthma   . Chronic hoarseness   . Allergic rhinitis due to other allergen   . PONV (postoperative nausea and vomiting)   . Left bundle branch block      (abnormal heart rhythm)  . Anginal pain ([place]) 2011    hospitalized for CP in the past; due to low potassium  . GERD (gastroesophageal reflux disease)   . History of hiatal hernia   . Arthritis   . Hx of blood transfusion reaction     in the early 80s    Past Surgical History  Procedure Laterality Date  . Cardiac catheterization  10/2009    Cardiac cath normal coronary arteries  . Other surgical history  1983    hysterectomy ovaries intact, prolapsed   . Cervical disc arthroplasty      disc replacement in neck 1998  . Other surgical history  2006    pyloric stricture surgery  . Colonoscopy      10 year repeat 09/26/2010  . Esophagogastroduodenoscopy  09/26/2010,01/08/13  . Abdominal hysterectomy  1983  . Total hip arthroplasty Right 06/07/2015    Procedure: TOTAL HIP ARTHROPLASTY ANTERIOR APPROACH;  Surgeon: Frederik Pear, MD;  Location: [facility];  Service: Orthopedics;  Laterality: Right;    There were no vitals filed for this visit.  Visit Diagnosis:  Status post right hip replacement  Right hip  pain  Difficulty walking      Subjective Assessment - 07/07/15 1513    Subjective "Another busy day it is a little tender"   Currently in Pain? Yes   Pain Score 2    Pain Location Hip   Pain Orientation Right            [facility] PT Assessment - 07/07/15 0001    Strength   Overall Strength Comments right hip flexion 4+/5, abduction 4/5 little pain, extension 4+/5                     [facility] Adult PT Treatment/Exercise - 07/07/15 0001    Ambulation/Gait   Gait Comments Walked around building up and down slopes over uneven terrain >500 ft   Knee/Hip Exercises: Aerobic   Nustep Level 3 x 7 minutes                  PT Short Term Goals - 07/05/15 1547    PT SHORT TERM GOAL #1   Title independent with scar massage   Status Achieved           PT Long Term Goals - 07/07/15 1518    PT LONG TERM GOAL #3   Title increase strength of the right hip to 4/5  Status New               Plan - 07/07/15 1533    Clinical Impression Statement Pt expressed that she had a busy day ans was tired. Pt has completed all goals and is pleased with her functional status.   Pt will benefit from skilled therapeutic intervention in order to improve on the following deficits Abnormal gait;Decreased activity tolerance;Decreased balance;Decreased mobility;Decreased range of motion;Decreased scar mobility;Decreased strength;Increased edema;Difficulty walking;Pain   Rehab Potential Good   PT Frequency 3x / week   PT Duration 8 weeks   PT Treatment/Interventions ADLs/Self Care Home Management;Electrical Stimulation;Cryotherapy;Stair training;Gait training;Functional mobility training;Therapeutic activities;Therapeutic exercise;Manual techniques;Patient/family education;Balance training;Passive range of motion;Vasopneumatic Device   PT Next Visit Plan D/C PT         Problem List Patient Active Problem List   Diagnosis Date Noted  . Primary osteoarthritis of right hip  06/05/2015  . Pre-operative clearance 06/02/2015  . Chest pain 06/02/2015  . LBBB (left bundle branch block) 06/02/2015  . History of left hip replacement 06/02/2015  . Palpitation 10/07/2014   PHYSICAL THERAPY DISCHARGE SUMMARY  Visits from Start of Care: 7   Plan: Patient agrees to discharge.  Patient goals were met. Patient is being discharged due to meeting the stated rehab goals.  ?????        Ronald G Pemberton, PTA  07/07/2015, 3:34 PM  [facility] [address] [place], [place], [address] Phone: [phone]   Fax:  [phone]  Name: Danielle Garrett MRN: [id number] Date of Birth: 09/24/1950     

## 2015-07-08 ENCOUNTER — Ambulatory Visit: Payer: Medicare Other | Admitting: Physical Therapy

## 2015-07-20 DIAGNOSIS — Z9889 Other specified postprocedural states: Secondary | ICD-10-CM | POA: Diagnosis not present

## 2015-07-20 DIAGNOSIS — Z96641 Presence of right artificial hip joint: Secondary | ICD-10-CM | POA: Diagnosis not present

## 2015-09-30 DIAGNOSIS — H524 Presbyopia: Secondary | ICD-10-CM | POA: Diagnosis not present

## 2015-09-30 DIAGNOSIS — H10501 Unspecified blepharoconjunctivitis, right eye: Secondary | ICD-10-CM | POA: Diagnosis not present

## 2015-10-07 DIAGNOSIS — H10501 Unspecified blepharoconjunctivitis, right eye: Secondary | ICD-10-CM | POA: Diagnosis not present

## 2015-10-07 DIAGNOSIS — H524 Presbyopia: Secondary | ICD-10-CM | POA: Diagnosis not present

## 2015-11-12 DIAGNOSIS — L821 Other seborrheic keratosis: Secondary | ICD-10-CM | POA: Diagnosis not present

## 2015-11-12 DIAGNOSIS — D2271 Melanocytic nevi of right lower limb, including hip: Secondary | ICD-10-CM | POA: Diagnosis not present

## 2015-11-12 DIAGNOSIS — L814 Other melanin hyperpigmentation: Secondary | ICD-10-CM | POA: Diagnosis not present

## 2015-11-12 DIAGNOSIS — D1801 Hemangioma of skin and subcutaneous tissue: Secondary | ICD-10-CM | POA: Diagnosis not present

## 2015-11-12 DIAGNOSIS — D225 Melanocytic nevi of trunk: Secondary | ICD-10-CM | POA: Diagnosis not present

## 2015-11-12 DIAGNOSIS — L308 Other specified dermatitis: Secondary | ICD-10-CM | POA: Diagnosis not present

## 2015-11-15 DIAGNOSIS — N3001 Acute cystitis with hematuria: Secondary | ICD-10-CM | POA: Diagnosis not present

## 2015-11-28 DIAGNOSIS — N398 Other specified disorders of urinary system: Secondary | ICD-10-CM | POA: Diagnosis not present

## 2015-11-28 DIAGNOSIS — N390 Urinary tract infection, site not specified: Secondary | ICD-10-CM | POA: Diagnosis not present

## 2015-11-28 DIAGNOSIS — R319 Hematuria, unspecified: Secondary | ICD-10-CM | POA: Diagnosis not present

## 2016-01-10 DIAGNOSIS — E785 Hyperlipidemia, unspecified: Secondary | ICD-10-CM | POA: Diagnosis not present

## 2016-01-10 DIAGNOSIS — Z780 Asymptomatic menopausal state: Secondary | ICD-10-CM | POA: Diagnosis not present

## 2016-01-10 DIAGNOSIS — I10 Essential (primary) hypertension: Secondary | ICD-10-CM | POA: Diagnosis not present

## 2016-01-10 DIAGNOSIS — F4321 Adjustment disorder with depressed mood: Secondary | ICD-10-CM | POA: Diagnosis not present

## 2016-01-10 DIAGNOSIS — Z6823 Body mass index (BMI) 23.0-23.9, adult: Secondary | ICD-10-CM | POA: Diagnosis not present

## 2016-01-10 DIAGNOSIS — R635 Abnormal weight gain: Secondary | ICD-10-CM | POA: Diagnosis not present

## 2016-01-10 DIAGNOSIS — R2689 Other abnormalities of gait and mobility: Secondary | ICD-10-CM | POA: Diagnosis not present

## 2016-01-10 DIAGNOSIS — Z23 Encounter for immunization: Secondary | ICD-10-CM | POA: Diagnosis not present

## 2016-02-03 DIAGNOSIS — Z780 Asymptomatic menopausal state: Secondary | ICD-10-CM | POA: Diagnosis not present

## 2016-02-03 DIAGNOSIS — M8588 Other specified disorders of bone density and structure, other site: Secondary | ICD-10-CM | POA: Diagnosis not present

## 2016-02-10 DIAGNOSIS — J209 Acute bronchitis, unspecified: Secondary | ICD-10-CM | POA: Diagnosis not present

## 2016-02-10 DIAGNOSIS — I10 Essential (primary) hypertension: Secondary | ICD-10-CM | POA: Diagnosis not present

## 2016-02-15 DIAGNOSIS — J209 Acute bronchitis, unspecified: Secondary | ICD-10-CM | POA: Diagnosis not present

## 2016-03-15 ENCOUNTER — Other Ambulatory Visit: Payer: Self-pay | Admitting: Internal Medicine

## 2016-03-15 ENCOUNTER — Ambulatory Visit
Admission: RE | Admit: 2016-03-15 | Discharge: 2016-03-15 | Disposition: A | Discharge status: Home / Self Care | Payer: Medicare Other | Source: Ambulatory Visit | Attending: Internal Medicine | Admitting: Internal Medicine

## 2016-03-15 DIAGNOSIS — J209 Acute bronchitis, unspecified: Secondary | ICD-10-CM | POA: Diagnosis not present

## 2016-03-15 DIAGNOSIS — J42 Unspecified chronic bronchitis: Secondary | ICD-10-CM

## 2016-03-15 DIAGNOSIS — R55 Syncope and collapse: Secondary | ICD-10-CM | POA: Diagnosis not present

## 2016-03-15 DIAGNOSIS — R05 Cough: Secondary | ICD-10-CM | POA: Diagnosis not present

## 2016-03-15 DIAGNOSIS — I10 Essential (primary) hypertension: Secondary | ICD-10-CM | POA: Diagnosis not present

## 2016-03-15 IMAGING — CR DG CHEST 2V
2 series · 2 of 2 positions shown · non-contrast
Comparison: Chest x-ray of [DATE]

CLINICAL DATA: Cough for several weeks, history of asthma

EXAM:
CHEST  2 VIEW

[w chest pa]
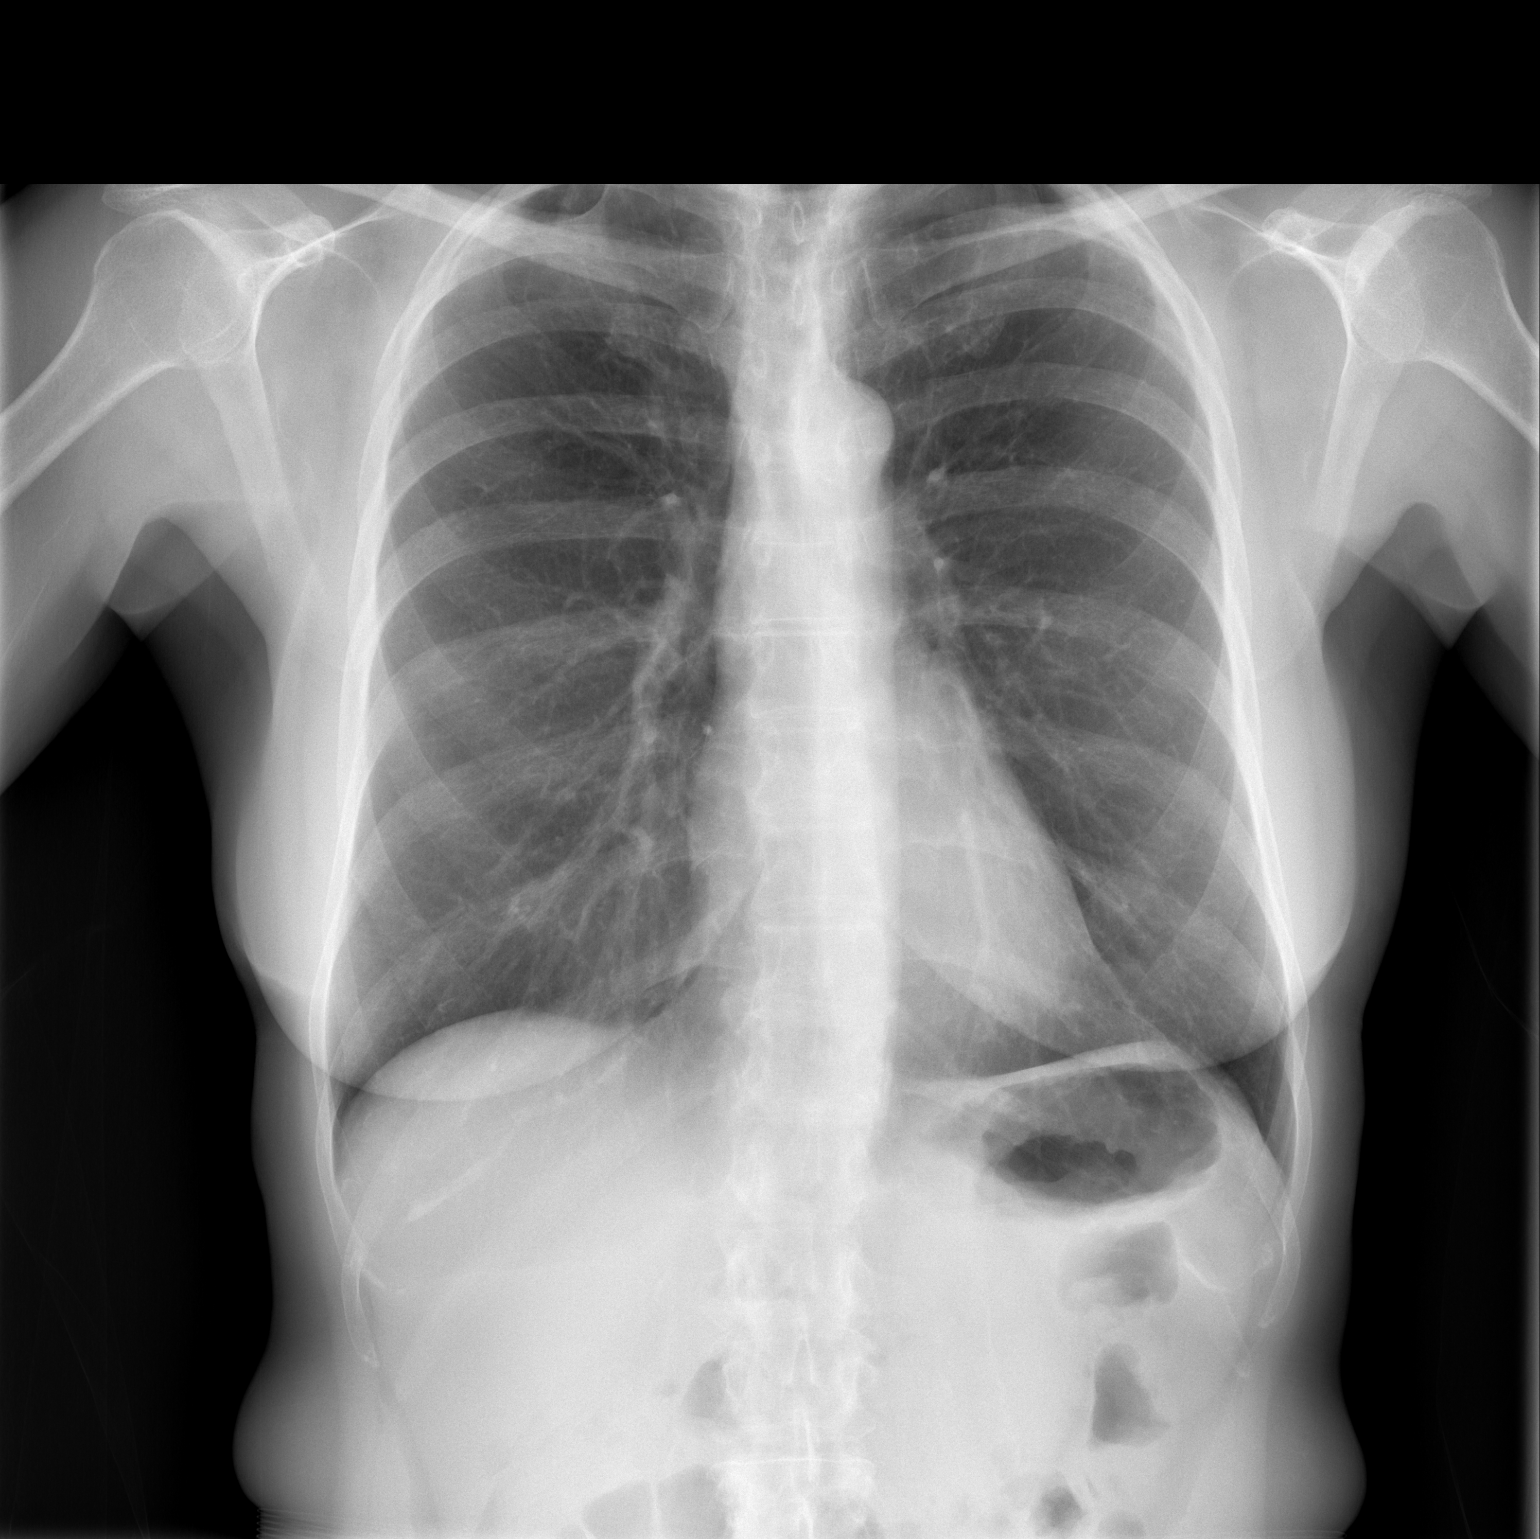

[w chest lat]
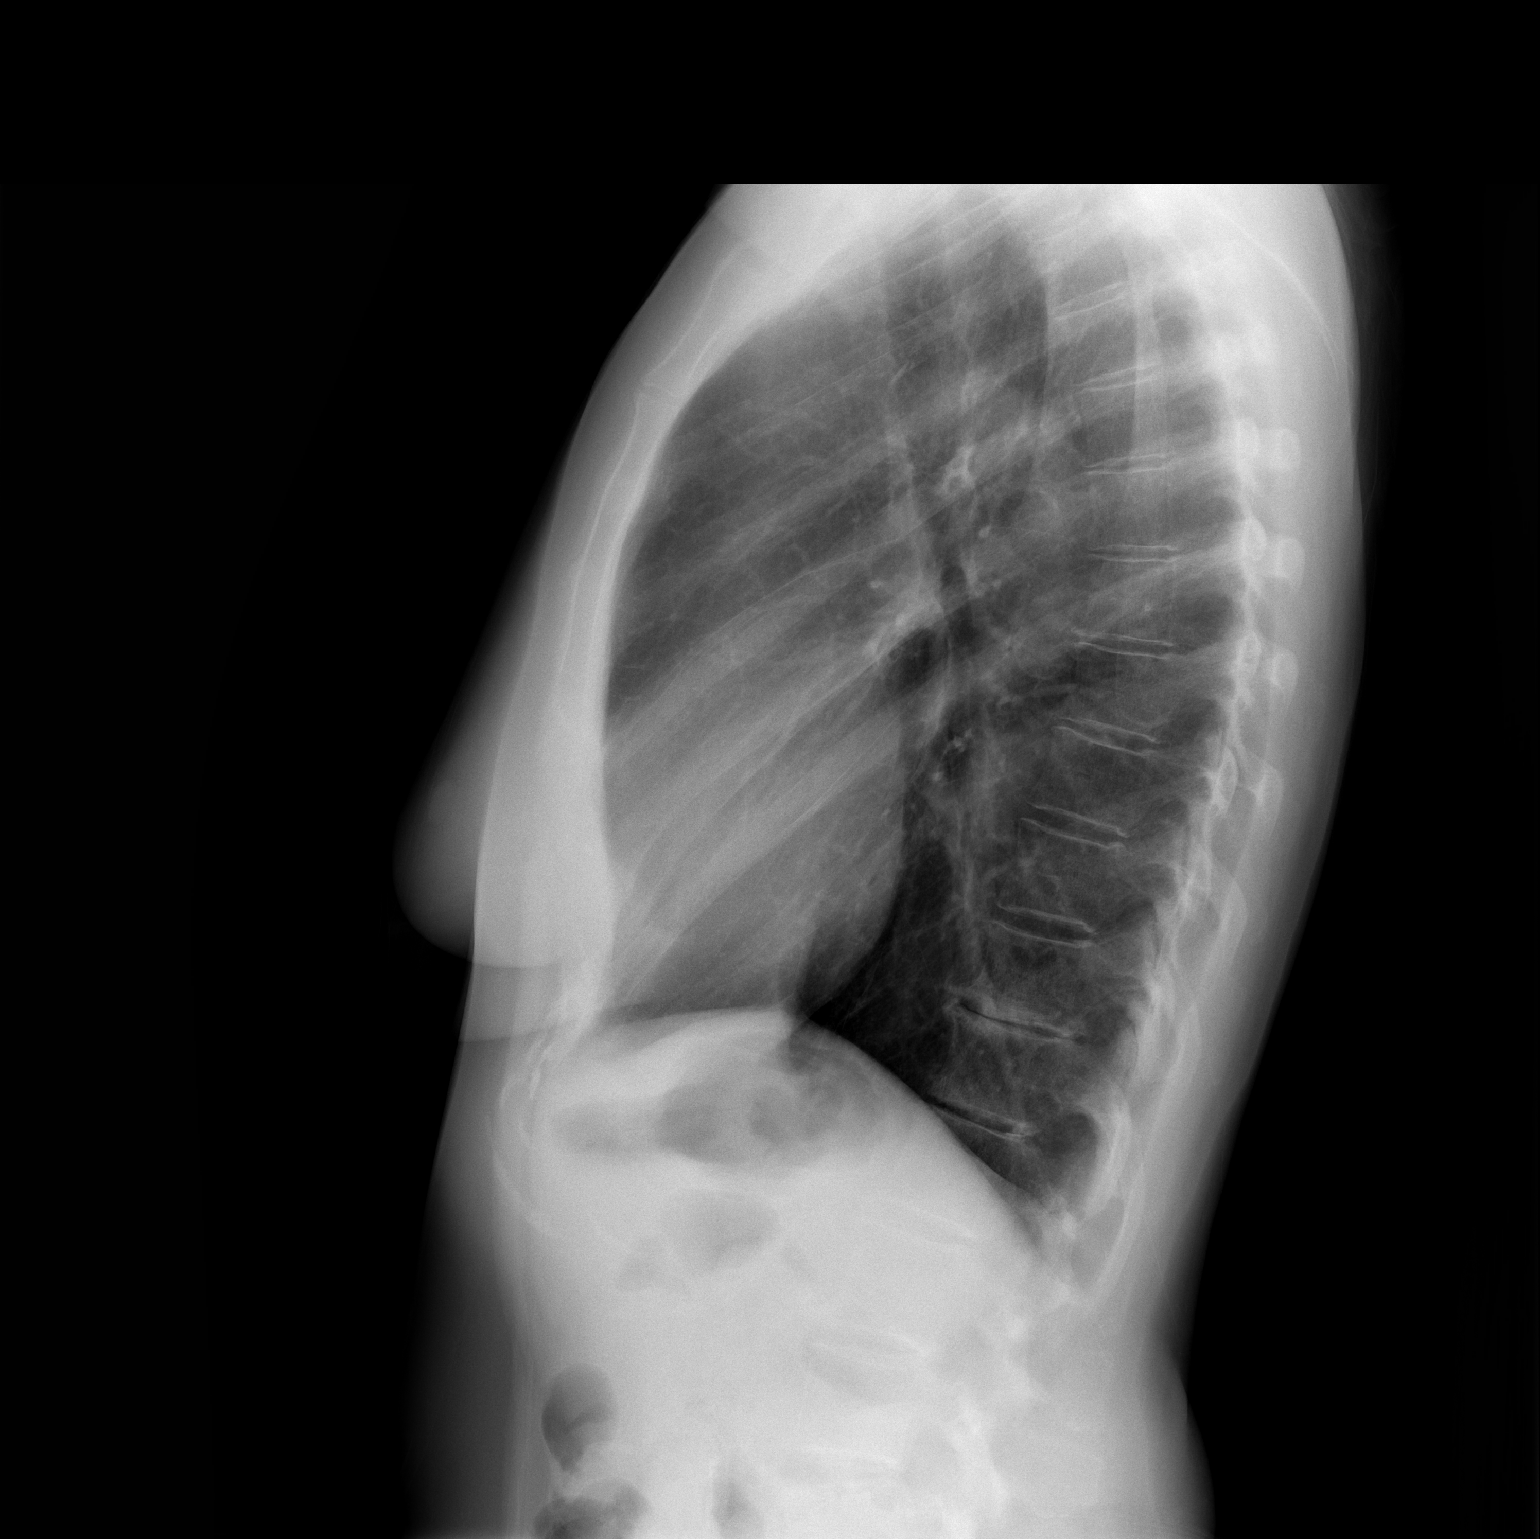

[2 of 2 positions shown; findings below may reference images not displayed]

FINDINGS: No active infiltrate or effusion is seen. The lungs are still
somewhat hyperaerated. Mediastinal and hilar contours are
unremarkable. The heart is within normal limits in size. No bony
abnormality is seen.
IMPRESSION: Hyperaeration.  No active lung disease.

## 2016-03-20 ENCOUNTER — Encounter: Payer: Self-pay | Admitting: Cardiology

## 2016-03-20 ENCOUNTER — Ambulatory Visit (INDEPENDENT_AMBULATORY_CARE_PROVIDER_SITE_OTHER): Payer: Medicare Other | Admitting: Cardiology

## 2016-03-20 VITALS — BP 160/82 | HR 79 | Ht 61.0 in | Wt 120.0 lb

## 2016-03-20 DIAGNOSIS — I447 Left bundle-branch block, unspecified: Secondary | ICD-10-CM | POA: Diagnosis not present

## 2016-03-20 DIAGNOSIS — R55 Syncope and collapse: Secondary | ICD-10-CM

## 2016-03-20 NOTE — Progress Notes (Signed)
Cardiology Office Note   Date:  03/20/2016   ID:  Danielle Garrett, DOB 12-Jun-1950, MRN [id number]  PCP:  Ileana Roup, MD  Cardiologist:  Dr. Tamala Julian    Chief Complaint  Patient presents with  . Dizziness      History of Present Illness: Danielle Garrett is a 66 y.o. female who presents for pre syncope and HTN.    The patient has long-standing documented left bundle branch block. An episode of chest discomfort in 2011 that led to hospitalization. Because of the presentation in the setting of left bundle branch block, coronary angiography was performed and was entirely normal including left ventricular function.   Hx of Rt total hip in Feb 2017.    Now presents with near syncope.  She has hd 4 episodes this year.  They have occurred with sitting and some with standing.  Most severe with sitting she became very lightheaded like everything closing in and very diaphoretic.  Had to lie down.  She had eaten and had plenty of liquid that day.  She was taken to ER and was orthostatic.  Given fluids, after these episodes very weak.  Other times BP elevated like today 160/82.   She has noted Systolic BP in the [id number] with feeling weak.   No chest pain, some SOB though currently recovering from bronchitis.  These episodes all similar have made her worry that she may be injured if she were to pass out.    No awareness of rapid HR or palpitations.   Past Medical History:  Diagnosis Date  . Allergic rhinitis due to other allergen   . Anginal pain ([place]) 2011   hospitalized for CP in the past; due to low potassium  . Anxiety   . Arthritis   . Childhood asthma   . Chronic hoarseness   . Gastroparesis   . GERD (gastroesophageal reflux disease)   . History of hiatal hernia   . Hx of blood transfusion reaction    in the early 80s  . Hypertension   . Left bundle branch block     (abnormal heart rhythm)  . PONV (postoperative nausea and vomiting)   . Pyloric stenosis     Past Surgical History:    Procedure Laterality Date  . ABDOMINAL HYSTERECTOMY  1983  . CARDIAC CATHETERIZATION  10/2009   Cardiac cath normal coronary arteries  . CERVICAL DISC ARTHROPLASTY     disc replacement in neck 1998  . COLONOSCOPY     10 year repeat 09/26/2010  . ESOPHAGOGASTRODUODENOSCOPY  09/26/2010,01/08/13  . OTHER SURGICAL HISTORY  1983   hysterectomy ovaries intact, prolapsed   . OTHER SURGICAL HISTORY  2006   pyloric stricture surgery  . TOTAL HIP ARTHROPLASTY Right 06/07/2015   Procedure: TOTAL HIP ARTHROPLASTY ANTERIOR APPROACH;  Surgeon: Frederik Pear, MD;  Location: [facility];  Service: Orthopedics;  Laterality: Right;     Current Outpatient Prescriptions  Medication Sig Dispense Refill  . amLODipine (NORVASC) 2.5 MG tablet Take 2.5 mg by mouth daily.    Marland Kitchen aspirin EC 81 MG tablet Take 81 mg by mouth daily.    Marland Kitchen FLUoxetine (PROZAC) 10 MG capsule Take 10 mg by mouth daily.    Marland Kitchen losartan (COZAAR) 100 MG tablet Take 50 mg by mouth 2 (two) times daily.     . pantoprazole (PROTONIX) 40 MG tablet Take 40 mg by mouth 2 (two) times daily.    . sucralfate (CARAFATE) 1 g tablet Take 1 g by mouth 2 (  two) times daily.    Marland Kitchen triamcinolone (NASACORT ALLERGY 24HR) 55 MCG/ACT AERO nasal inhaler Place 2 sprays into the nose daily as needed (for allergies).     No current facility-administered medications for this visit.     Allergies:   Hydrochlorothiazide; Adhesive [tape]; Codeine; Penicillins; Sulfa antibiotics; and Vicodin [hydrocodone-acetaminophen]    Social History:  The patient  reports that she has never smoked. She has never used smokeless tobacco. She reports that she does not drink alcohol or use drugs.   Family History:  The patient's family history includes Cirrhosis in her father; Depression in her sister and sister; Hypertension in her father, mother, and sister; OCD in her sister.    ROS:  General:+ colds no fevers, + weight loss after hip surgery Skin:no rashes or ulcers HEENT:no blurred  vision, no congestion CV:see HPI PUL:see HPI GI:no diarrhea constipation or melena, no indigestion GU:no hematuria, no dysuria MS:no joint pain, no claudication Neuro:near syncope, + lightheadedness Endo:no diabetes, no thyroid disease  Wt Readings from Last 3 Encounters:  03/20/16 120 lb (54.4 kg)  06/07/15 126 lb (57.2 kg)  06/03/15 126 lb 6.4 oz (57.3 kg)     PHYSICAL EXAM: VS:  BP (!) 160/82   Pulse 79   Ht 5\' 1"  (1.549 m)   Wt 120 lb (54.4 kg)   BMI 22.67 kg/m  , BMI Body mass index is 22.67 kg/m. General:Pleasant affect, NAD Skin:Warm and dry, brisk capillary refill HEENT:normocephalic, sclera clear, mucus membranes moist Neck:supple, no JVD, no bruits  Heart:S1S2 RRR without murmur, gallup, rub or click Lungs:clear without rales, rhonchi, or wheezes [id number], non tender, + BS, do not palpate liver spleen or masses Ext:no lower ext edema, 2+ pedal pulses, 2+ radial pulses Neuro:alert and oriented X 3, MAE, follows commands, + facial symmetry    EKG:  EKG is ordered today. The ekg ordered today demonstrates SR with LBBB today borderline    Recent Labs: 06/08/2015: BUN 6; Creatinine, Ser 0.80; Potassium 4.3; Sodium 131 06/10/2015: Hemoglobin 9.9; Platelets 181    Lipid Panel    Component Value Date/Time   CHOL (H) 11/09/2009 0215    216        ATP III CLASSIFICATION:  <200     mg/dL   Desirable  200-239  mg/dL   Borderline High  >=240    mg/dL   High          TRIG 71 11/09/2009 0215   HDL 69 11/09/2009 0215   CHOLHDL 3.1 11/09/2009 0215   VLDL 14 11/09/2009 0215   LDLCALC (H) 11/09/2009 0215    133        Total Cholesterol/HDL:CHD Risk Coronary Heart Disease Risk Table                     Men   Women  1/2 Average Risk   3.4   3.3  Average Risk       5.0   4.4  2 X Average Risk   9.6   7.1  3 X Average Risk  23.4   11.0        Use the calculated Patient Ratio above and the CHD Risk Table to determine the patient's CHD Risk.        ATP III  CLASSIFICATION (LDL):  <100     mg/dL   Optimal  100-129  mg/dL   Near or Above  Optimal  130-159  mg/dL   Borderline  160-189  mg/dL   High  >190     mg/dL   Very High       Other studies Reviewed: Additional studies/ records that were reviewed today include: prior echo 2011. Mild concentric hypertrophy, EF 50-55%. Mild MR PA pk pressure 34 mmhg.    ASSESSMENT AND PLAN:  1.  Near syncope - these do sound vasovagal.  Will check echo and 30 day event monitor though we may not capture an event she may have some arrhthymias she is not aware that could give clue about her episodes.  She will follow up with Dr. Tamala Julian or myself when he is in the office.    Encouraged to take in plenty of fluids.     Current medicines are reviewed with the patient today.  The patient Has no concerns regarding medicines.  The following changes have been made:  See above Labs/ tests ordered today include:see above  Disposition:   FU:  see above  Signed, Cecilie Kicks, NP  03/20/2016 2:34 PM    [facility], [place], [place], [place] Phone: [phone]; Fax: [phone]

## 2016-03-20 NOTE — Patient Instructions (Signed)
Medication Instructions: Your physician recommends that you continue on your current medications as directed. Please refer to the Current Medication list given to you today.   Testing/Procedures: Your physician has requested that you have an echocardiogram. Echocardiography is a painless test that uses sound waves to create images of your heart. It provides your doctor with information about the size and shape of your heart and how well your heart's chambers and valves are working. This procedure takes approximately one hour. There are no restrictions for this procedure.  Your physician has recommended that you wear an event monitor for 30 days. Event monitors are medical devices that record the heart's electrical activity. Doctors most often [place] these monitors to diagnose arrhythmias. Arrhythmias are problems with the speed or rhythm of the heartbeat. The monitor is a small, portable device. You can wear one while you do your normal daily activities. This is usually used to diagnose what is causing palpitations/syncope (passing out).   Follow-Up: Your physician recommends that you schedule a follow-up appointment in: 5-6 weeks with Dr. Tamala Julian or Cecilie Kicks, NP when Dr. Tamala Julian is in the office.  If you need a refill on your cardiac medications before your next appointment, please call your pharmacy.

## 2016-03-28 DIAGNOSIS — J209 Acute bronchitis, unspecified: Secondary | ICD-10-CM | POA: Diagnosis not present

## 2016-03-30 ENCOUNTER — Ambulatory Visit (INDEPENDENT_AMBULATORY_CARE_PROVIDER_SITE_OTHER): Payer: Medicare Other

## 2016-03-30 DIAGNOSIS — R55 Syncope and collapse: Secondary | ICD-10-CM | POA: Diagnosis not present

## 2016-03-30 DIAGNOSIS — N952 Postmenopausal atrophic vaginitis: Secondary | ICD-10-CM | POA: Diagnosis not present

## 2016-03-30 DIAGNOSIS — I10 Essential (primary) hypertension: Secondary | ICD-10-CM | POA: Diagnosis not present

## 2016-03-30 DIAGNOSIS — N393 Stress incontinence (female) (male): Secondary | ICD-10-CM | POA: Diagnosis not present

## 2016-04-11 ENCOUNTER — Other Ambulatory Visit: Payer: Self-pay

## 2016-04-11 ENCOUNTER — Ambulatory Visit (HOSPITAL_COMMUNITY): Payer: Medicare Other | Attending: Internal Medicine

## 2016-04-11 DIAGNOSIS — I447 Left bundle-branch block, unspecified: Secondary | ICD-10-CM | POA: Diagnosis not present

## 2016-04-11 DIAGNOSIS — R55 Syncope and collapse: Secondary | ICD-10-CM

## 2016-04-11 DIAGNOSIS — I071 Rheumatic tricuspid insufficiency: Secondary | ICD-10-CM | POA: Insufficient documentation

## 2016-04-11 DIAGNOSIS — I10 Essential (primary) hypertension: Secondary | ICD-10-CM | POA: Diagnosis not present

## 2016-04-11 DIAGNOSIS — I340 Nonrheumatic mitral (valve) insufficiency: Secondary | ICD-10-CM | POA: Insufficient documentation

## 2016-04-11 DIAGNOSIS — F419 Anxiety disorder, unspecified: Secondary | ICD-10-CM | POA: Diagnosis not present

## 2016-05-01 DIAGNOSIS — F4321 Adjustment disorder with depressed mood: Secondary | ICD-10-CM | POA: Diagnosis not present

## 2016-05-01 DIAGNOSIS — Z1231 Encounter for screening mammogram for malignant neoplasm of breast: Secondary | ICD-10-CM | POA: Diagnosis not present

## 2016-05-01 DIAGNOSIS — N393 Stress incontinence (female) (male): Secondary | ICD-10-CM | POA: Diagnosis not present

## 2016-05-01 DIAGNOSIS — Z0000 Encounter for general adult medical examination without abnormal findings: Secondary | ICD-10-CM | POA: Diagnosis not present

## 2016-05-01 DIAGNOSIS — I10 Essential (primary) hypertension: Secondary | ICD-10-CM | POA: Diagnosis not present

## 2016-05-01 DIAGNOSIS — Z1389 Encounter for screening for other disorder: Secondary | ICD-10-CM | POA: Diagnosis not present

## 2016-05-01 DIAGNOSIS — Z1211 Encounter for screening for malignant neoplasm of colon: Secondary | ICD-10-CM | POA: Diagnosis not present

## 2016-05-01 DIAGNOSIS — E785 Hyperlipidemia, unspecified: Secondary | ICD-10-CM | POA: Diagnosis not present

## 2016-05-01 DIAGNOSIS — K5730 Diverticulosis of large intestine without perforation or abscess without bleeding: Secondary | ICD-10-CM | POA: Diagnosis not present

## 2016-05-01 DIAGNOSIS — Z1239 Encounter for other screening for malignant neoplasm of breast: Secondary | ICD-10-CM | POA: Diagnosis not present

## 2016-05-08 ENCOUNTER — Telehealth: Payer: Self-pay | Admitting: Interventional Cardiology

## 2016-05-08 NOTE — Telephone Encounter (Signed)
Informed pt of monitor results. Pt verbalized understanding. 

## 2016-05-08 NOTE — Telephone Encounter (Signed)
F/U  Patient states she is returning a call from [place]

## 2016-05-17 ENCOUNTER — Encounter: Payer: Self-pay | Admitting: Cardiology

## 2016-05-17 ENCOUNTER — Ambulatory Visit (INDEPENDENT_AMBULATORY_CARE_PROVIDER_SITE_OTHER): Payer: Medicare Other | Admitting: Cardiology

## 2016-05-17 VITALS — BP 118/86 | HR 71 | Ht 61.0 in | Wt 121.4 lb

## 2016-05-17 DIAGNOSIS — R55 Syncope and collapse: Secondary | ICD-10-CM | POA: Diagnosis not present

## 2016-05-17 DIAGNOSIS — I447 Left bundle-branch block, unspecified: Secondary | ICD-10-CM

## 2016-05-17 NOTE — Progress Notes (Signed)
Cardiology Office Note   Date:  05/17/2016   ID:  Danielle Garrett, DOB [birth year], MRN [id number]  PCP:  Leeroy Cha, MD  Cardiologist:  Dr. Tamala Julian     Chief Complaint  Patient presents with  . Near Syncope    recurrent      History of Present Illness: Danielle Garrett is a 66 y.o. female who presents for results of echo and event monitor.  She has chronic LBBB and hx of cath 2011 that was normal.  I saw her in early Dec for near syncope - she has had 4 episodes this year.  The most recent she was sitting and everything was closed in and she was diaphoretic.  She was orthostatic in ER and given IV fluids.    Event monitor with SR occ PVCs and no a fib or sustained tachycardia.  Echo with EF 60-65% G 1 DD, trivial MR, PA pk pressure 33 mmHg mild LVH.   Today she tells me she had another episode 2 days after removing the monitor.  She felt very weak and made it to a chair and may have passed out for a second.  She has to lie down and sleep for an hour or two afterward and then she is weak the rest of the day.  After full night sleep she is fine the next day.  This is 5 th episode this year.  No chest pain and no SOB.   She had just had a bottle of water before episode.  She is frustrated and wants to know why.  We reviewed the echo and event monitor.  She did develop sin irritation with the electrodes.     Past Medical History:  Diagnosis Date  . Allergic rhinitis due to other allergen   . Anginal pain ([place]) 2011   hospitalized for CP in the past; due to low potassium  . Anxiety   . Arthritis   . Childhood asthma   . Chronic hoarseness   . Gastroparesis   . GERD (gastroesophageal reflux disease)   . History of hiatal hernia   . Hx of blood transfusion reaction    in the early 80s  . Hypertension   . Left bundle branch block     (abnormal heart rhythm)  . PONV (postoperative nausea and vomiting)   . Pyloric stenosis     Past Surgical History:  Procedure Laterality Date  .  ABDOMINAL HYSTERECTOMY  1983  . CARDIAC CATHETERIZATION  10/2009   Cardiac cath normal coronary arteries  . CERVICAL DISC ARTHROPLASTY     disc replacement in neck 1998  . COLONOSCOPY     10 year repeat 09/26/2010  . ESOPHAGOGASTRODUODENOSCOPY  09/26/2010,01/08/13  . OTHER SURGICAL HISTORY  1983   hysterectomy ovaries intact, prolapsed   . OTHER SURGICAL HISTORY  2006   pyloric stricture surgery  . TOTAL HIP ARTHROPLASTY Right 06/07/2015   Procedure: TOTAL HIP ARTHROPLASTY ANTERIOR APPROACH;  Surgeon: Frederik Pear, MD;  Location: [facility];  Service: Orthopedics;  Laterality: Right;     Current Outpatient Prescriptions  Medication Sig Dispense Refill  . amLODipine (NORVASC) 2.5 MG tablet Take 2.5 mg by mouth daily.    Marland Kitchen aspirin EC 81 MG tablet Take 81 mg by mouth daily.    Marland Kitchen FLUoxetine (PROZAC) 10 MG capsule Take 10 mg by mouth daily.    Marland Kitchen losartan (COZAAR) 100 MG tablet Take 50 mg by mouth 2 (two) times daily.     . pantoprazole (PROTONIX) 40  MG tablet Take 40 mg by mouth 2 (two) times daily.    . sucralfate (CARAFATE) 1 g tablet Take 1 g by mouth 2 (two) times daily.    Marland Kitchen triamcinolone (NASACORT ALLERGY 24HR) 55 MCG/ACT AERO nasal inhaler Place 2 sprays into the nose daily as needed (for allergies).     No current facility-administered medications for this visit.     Allergies:   Hydrochlorothiazide; Adhesive [tape]; Codeine; Penicillins; Sulfa antibiotics; and Vicodin [hydrocodone-acetaminophen]    Social History:  The patient  reports that she has never smoked. She has never used smokeless tobacco. She reports that she does not drink alcohol or use drugs.   Family History:  The patient's family history includes Cirrhosis in her father; Depression in her sister and sister; Hypertension in her father, mother, and sister; OCD in her sister.    ROS:  General:no colds or fevers, no weight changes HEENT:no blurred vision, no congestion CV:see HPI PUL:see HPI GI:no diarrhea constipation  or melena, no indigestion Neuro:near syncope, + lightheadedness Endo:no diabetes, no thyroid disease  Wt Readings from Last 3 Encounters:  05/17/16 121 lb 6.4 oz (55.1 kg)  03/20/16 120 lb (54.4 kg)  06/07/15 126 lb (57.2 kg)     PHYSICAL EXAM: VS:  BP 118/86   Pulse 71   Ht 5\' 1"  (1.549 m)   Wt 121 lb 6.4 oz (55.1 kg)   SpO2 98%   BMI 22.94 kg/m  , BMI Body mass index is 22.94 kg/m. General:Pleasant affect, NAD Skin:Warm and dry, brisk capillary refill Heart:S1S2 RRR without murmur, gallup, rub or click Lungs:clear without rales, rhonchi, or wheezes Neuro:alert and oriented X 3, MAE, follows commands, + facial symmetry    EKG:  EKG is NOT ordered today.  Recent Labs: 06/08/2015: BUN 6; Creatinine, Ser 0.80; Potassium 4.3; Sodium 131 06/10/2015: Hemoglobin 9.9; Platelets 181    Lipid Panel    Component Value Date/Time   CHOL (H) 11/09/2009 0215    216        ATP III CLASSIFICATION:  <200     mg/dL   Desirable  200-239  mg/dL   Borderline High  >=240    mg/dL   High          TRIG 71 11/09/2009 0215   HDL 69 11/09/2009 0215   CHOLHDL 3.1 11/09/2009 0215   VLDL 14 11/09/2009 0215   LDLCALC (H) 11/09/2009 0215    133        Total Cholesterol/HDL:CHD Risk Coronary Heart Disease Risk Table                     Men   Women  1/2 Average Risk   3.4   3.3  Average Risk       5.0   4.4  2 X Average Risk   9.6   7.1  3 X Average Risk  23.4   11.0        Use the calculated Patient Ratio above and the CHD Risk Table to determine the patient's CHD Risk.        ATP III CLASSIFICATION (LDL):  <100     mg/dL   Optimal  100-129  mg/dL   Near or Above                    Optimal  130-159  mg/dL   Borderline  160-189  mg/dL   High  >190     mg/dL   Very High  Other studies Reviewed: Additional studies/ records that were reviewed today include: . Echo: Study Conclusions  - Left ventricle: The cavity size was normal. Wall thickness was   increased in a  pattern of mild LVH. Systolic function was normal.   The estimated ejection fraction was in the range of 60% to 65%.   Wall motion was normal; there were no regional wall motion   abnormalities. Doppler parameters are consistent with abnormal   left ventricular relaxation (grade 1 diastolic dysfunction). The   E/e&' ratio is between 8-15, suggesting indeterminate LV filling   pressure. - Mitral valve: Mildly thickened leaflets . There was trivial   regurgitation. - Left atrium: The atrium was normal in size. - Tricuspid valve: There was mild regurgitation. - Pulmonary arteries: PA peak pressure: 33 mm Hg (S). - Inferior vena cava: The vessel was normal in size. The   respirophasic diameter changes were in the normal range (>= 50%),   consistent with normal central venous pressure.  Impressions:  - LVEF 60-65%, mild LVH, normal wall motion, diastolic dysfunction,   indeterminate LV filling pressure, normal LA size, mild TR, RVSP   33 mmHg, normal IVC.   ASSESSMENT AND PLAN:  1. Near syncope none on event monitor but had episode 2 days later.  Possible neuromediated vs arrhthymias,  Discussed with Dr. Tamala Julian and he recommends EP consult ? Linq ? For further eval.  She will wear support hose in the meantime to see if this will help prevent.    2. Chronic LBBB   3. Normal Coronary arteries in 2011.   Current medicines are reviewed with the patient today.  The patient Has no concerns regarding medicines.  The following changes have been made:  See above Labs/ tests ordered today include:see above  Disposition:   FU:  see above  Signed, Cecilie Kicks, NP  05/17/2016 5:54 PM    [facility] [place], [place], [place], [place] Phone: [phone]; Fax: [phone]

## 2016-05-17 NOTE — Patient Instructions (Addendum)
Medication Instructions:  Your physician recommends that you continue on your current medications as directed. Please refer to the Current Medication list given to you today.   Labwork: None ordered  Testing/Procedures: None ordered  Follow-Up: Your physician recommends that you schedule a follow-up appointment in: [place]  Your physician wants you to follow-up in: [place] DR. Gaspar Bidding will receive a reminder letter in the mail two months in advance. If you don't receive a letter, please call our office to schedule the follow-up appointment.    Any Other Special Instructions Will Be Listed Below (If Applicable).   If you need a refill on your cardiac medications before your next appointment, please call your pharmacy.

## 2016-05-24 ENCOUNTER — Encounter: Payer: Self-pay | Admitting: Internal Medicine

## 2016-05-24 ENCOUNTER — Ambulatory Visit (INDEPENDENT_AMBULATORY_CARE_PROVIDER_SITE_OTHER): Payer: Medicare Other | Admitting: Internal Medicine

## 2016-05-24 VITALS — BP 122/70 | HR 77 | Ht 61.0 in | Wt 131.4 lb

## 2016-05-24 DIAGNOSIS — R55 Syncope and collapse: Secondary | ICD-10-CM

## 2016-05-24 DIAGNOSIS — I447 Left bundle-branch block, unspecified: Secondary | ICD-10-CM | POA: Diagnosis not present

## 2016-05-24 NOTE — Progress Notes (Signed)
HPI Danielle Garrett is referred today for evaluation of syncope in the setting of LBBB. She is a pleasant 66 yo woman who has had passing out or near passing out spells for a couple of years. These episodes occur about 5-6 times a years. They can occur while sitting or standing and there does not appear to be an underlying stimulus which brings on the episodes. She will wake him within a few seconds but feels poorly for upto an hour. She wore a heart monitor for a month but had no symptoms. After she awakens she describes feeling cold and clammy.  Allergies  Allergen Reactions  . Hydrochlorothiazide Nausea Only  . Adhesive [Tape]     Pt states ' it took my skin off'  . Codeine Nausea And Vomiting  . Penicillins Other (See Comments)    Unknown  . Sulfa Antibiotics Nausea And Vomiting  . Vicodin [Hydrocodone-Acetaminophen] Nausea And Vomiting and Other (See Comments)    Did not feel like herself     Current Outpatient Prescriptions  Medication Sig Dispense Refill  . amLODipine (NORVASC) 2.5 MG tablet Take 2.5 mg by mouth daily.    Marland Kitchen aspirin EC 81 MG tablet Take 81 mg by mouth daily.    Marland Kitchen FLUoxetine (PROZAC) 10 MG capsule Take 10 mg by mouth daily.    Marland Kitchen losartan (COZAAR) 100 MG tablet Take 50 mg by mouth 2 (two) times daily.     . pantoprazole (PROTONIX) 40 MG tablet Take 40 mg by mouth 2 (two) times daily.    . sucralfate (CARAFATE) 1 g tablet Take 1 g by mouth 2 (two) times daily.    Marland Kitchen triamcinolone (NASACORT ALLERGY 24HR) 55 MCG/ACT AERO nasal inhaler Place 2 sprays into the nose daily as needed (for allergies).     No current facility-administered medications for this visit.      Past Medical History:  Diagnosis Date  . Allergic rhinitis due to other allergen   . Anginal pain ([place]) 2011   hospitalized for CP in the past; due to low potassium  . Anxiety   . Arthritis   . Childhood asthma   . Chronic hoarseness   . Gastroparesis   . GERD (gastroesophageal reflux disease)    . History of hiatal hernia   . Hx of blood transfusion reaction    in the early 80s  . Hypertension   . Left bundle branch block     (abnormal heart rhythm)  . PONV (postoperative nausea and vomiting)   . Pyloric stenosis     ROS:   All systems reviewed and negative except as noted in the HPI.   Past Surgical History:  Procedure Laterality Date  . ABDOMINAL HYSTERECTOMY  1983  . CARDIAC CATHETERIZATION  10/2009   Cardiac cath normal coronary arteries  . CERVICAL DISC ARTHROPLASTY     disc replacement in neck 1998  . COLONOSCOPY     10 year repeat 09/26/2010  . ESOPHAGOGASTRODUODENOSCOPY  09/26/2010,01/08/13  . OTHER SURGICAL HISTORY  1983   hysterectomy ovaries intact, prolapsed   . OTHER SURGICAL HISTORY  2006   pyloric stricture surgery  . TOTAL HIP ARTHROPLASTY Right 06/07/2015   Procedure: TOTAL HIP ARTHROPLASTY ANTERIOR APPROACH;  Surgeon: Frederik Pear, MD;  Location: [facility];  Service: Orthopedics;  Laterality: Right;     Family History  Problem Relation Age of Onset  . Hypertension Mother   . Cirrhosis Father   . Hypertension Father   . OCD  Sister   . Depression Sister   . Depression Sister   . Hypertension Sister      Social History   Social History  . Marital status: Married    Spouse name: N/A  . Number of children: N/A  . Years of education: N/A   Occupational History  . Not on file.   Social History Main Topics  . Smoking status: Never Smoker  . Smokeless tobacco: Never Used  . Alcohol use No  . Drug use: No  . Sexual activity: Not on file   Other Topics Concern  . Not on file   Social History Narrative  . No narrative on file     BP 122/70 (BP Location: Left Arm, Patient Position: Sitting, Cuff Size: Normal)   Pulse 77   Ht 5\' 1"  (1.549 m)   Wt 131 lb 6.4 oz (59.6 kg)   SpO2 96%   BMI 24.83 kg/m   Physical Exam:  Well appearing 66 yo woman, NAD HEENT: Unremarkable Neck:  6 cm JVD, no thyromegally Lymphatics:  No  adenopathy Back:  No CVA tenderness Lungs:  Clear with no wheezes HEART:  Regular rate rhythm, no murmurs, no rubs, no clicks, split S2. Abd:  soft, positive bowel sounds, no organomegally, no rebound, no guarding Ext:  2 plus pulses, no edema, no cyanosis, no clubbing Skin:  No rashes no nodules Neuro:  CN II through XII intact, motor grossly intact  EKG - NSR with LBBB, QRS 124  DEVICE  Normal device function.  See PaceArt for details.   Assess/Plan: 1. 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 Her story has components of both. I have recommended insertion of an ILR. The risks/benefits/goals/expectations of the procedure were reviewed with the patient and she wishes to proceed. 2. HTN - her blood pressure is well controlled. No change in her meds. 3. Chronic bronchitis - she is currently improved. We discussed the findings which might result in her need for antibiotics. She will undergo watchful waiting.   Mikle Bosworth.D.

## 2016-05-24 NOTE — Patient Instructions (Addendum)
Medication Instructions:  Your physician recommends that you continue on your current medications as directed. Please refer to the Current Medication list given to you today.   Labwork: None Ordered   Testing/Procedures: LINQ Implant on 05/29/16   Follow-Up: Your physician wants you to follow-up in: 3 months from procedure with Dr. Lovena Le.    Any Other Special Instructions Will Be Listed Below (If Applicable).  Report to the [facility] on 05/29/16 at 6:30 AM  You may eat and drink normally  You may take your medications day of procedure  You may drive yourself home after the procedure      If you need a refill on your cardiac medications before your next appointment, please call your pharmacy.

## 2016-05-29 ENCOUNTER — Encounter (HOSPITAL_COMMUNITY): Payer: Self-pay | Admitting: Internal Medicine

## 2016-05-29 ENCOUNTER — Encounter (HOSPITAL_COMMUNITY)
Admission: RE | Disposition: A | Discharge status: Home / Self Care | Payer: Self-pay | Source: Ambulatory Visit | Attending: Internal Medicine

## 2016-05-29 ENCOUNTER — Ambulatory Visit (HOSPITAL_COMMUNITY)
Admission: RE | Admit: 2016-05-29 | Discharge: 2016-05-29 | Disposition: A | Discharge status: Home / Self Care | Payer: Medicare Other | Source: Ambulatory Visit | Attending: Internal Medicine | Admitting: Internal Medicine

## 2016-05-29 DIAGNOSIS — I10 Essential (primary) hypertension: Secondary | ICD-10-CM | POA: Insufficient documentation

## 2016-05-29 DIAGNOSIS — Z8249 Family history of ischemic heart disease and other diseases of the circulatory system: Secondary | ICD-10-CM | POA: Insufficient documentation

## 2016-05-29 DIAGNOSIS — K3184 Gastroparesis: Secondary | ICD-10-CM | POA: Diagnosis not present

## 2016-05-29 DIAGNOSIS — K219 Gastro-esophageal reflux disease without esophagitis: Secondary | ICD-10-CM | POA: Insufficient documentation

## 2016-05-29 DIAGNOSIS — J42 Unspecified chronic bronchitis: Secondary | ICD-10-CM | POA: Diagnosis not present

## 2016-05-29 DIAGNOSIS — J3089 Other allergic rhinitis: Secondary | ICD-10-CM | POA: Diagnosis not present

## 2016-05-29 DIAGNOSIS — M1990 Unspecified osteoarthritis, unspecified site: Secondary | ICD-10-CM | POA: Diagnosis not present

## 2016-05-29 DIAGNOSIS — F419 Anxiety disorder, unspecified: Secondary | ICD-10-CM | POA: Diagnosis not present

## 2016-05-29 DIAGNOSIS — Z7951 Long term (current) use of inhaled steroids: Secondary | ICD-10-CM | POA: Insufficient documentation

## 2016-05-29 DIAGNOSIS — Z880 Allergy status to penicillin: Secondary | ICD-10-CM | POA: Diagnosis not present

## 2016-05-29 DIAGNOSIS — Z885 Allergy status to narcotic agent status: Secondary | ICD-10-CM | POA: Diagnosis not present

## 2016-05-29 DIAGNOSIS — Z7982 Long term (current) use of aspirin: Secondary | ICD-10-CM | POA: Diagnosis not present

## 2016-05-29 DIAGNOSIS — R55 Syncope and collapse: Secondary | ICD-10-CM | POA: Diagnosis not present

## 2016-05-29 DIAGNOSIS — I480 Paroxysmal atrial fibrillation: Secondary | ICD-10-CM | POA: Diagnosis present

## 2016-05-29 DIAGNOSIS — I447 Left bundle-branch block, unspecified: Secondary | ICD-10-CM | POA: Insufficient documentation

## 2016-05-29 DIAGNOSIS — I4891 Unspecified atrial fibrillation: Secondary | ICD-10-CM | POA: Diagnosis present

## 2016-05-29 HISTORY — PX: LOOP RECORDER INSERTION: EP1214

## 2016-05-29 SURGERY — LOOP RECORDER INSERTION
Anesthesia: LOCAL

## 2016-05-29 MED ORDER — LIDOCAINE HCL (PF) 1 % IJ SOLN
INTRAMUSCULAR | Status: DC | PRN
  Administered 2016-05-29: 25 mL

## 2016-05-29 MED ORDER — LIDOCAINE HCL (PF) 1 % IJ SOLN
INTRAMUSCULAR | Status: AC
Start: 2016-05-29 — End: 2016-05-29
  Filled 2016-05-29: qty 30

## 2016-05-29 MED ORDER — ONDANSETRON HCL 4 MG/2ML IJ SOLN: 4.0000 mg | Freq: Four times a day (QID) | INTRAMUSCULAR | Status: DC | PRN

## 2016-05-29 SURGICAL SUPPLY — 2 items
LOOP REVEAL LINQSYS (Prosthesis & Implant Heart) ×2 IMPLANT
PACK LOOP INSERTION (CUSTOM PROCEDURE TRAY) ×2 IMPLANT

## 2016-05-29 NOTE — H&P (View-Only) (Signed)
HPI [name] is referred today for evaluation of syncope in the setting of LBBB. She is a pleasant 66 yo woman who has had passing out or near passing out spells for a couple of years. These episodes occur about 5-6 times a years. They can occur while sitting or standing and there does not appear to be an underlying stimulus which brings on the episodes. She will wake him within a few seconds but feels poorly for upto an hour. She wore a heart monitor for a month but had no symptoms. After she awakens she describes feeling cold and clammy.  Allergies  Allergen Reactions  . Hydrochlorothiazide Nausea Only  . Adhesive [Tape]     Pt states ' it took my skin off'  . Codeine Nausea And Vomiting  . Penicillins Other (See Comments)    Unknown  . Sulfa Antibiotics Nausea And Vomiting  . Vicodin [Hydrocodone-Acetaminophen] Nausea And Vomiting and Other (See Comments)    Did not feel like herself     Current Outpatient Prescriptions  Medication Sig Dispense Refill  . amLODipine (NORVASC) 2.5 MG tablet Take 2.5 mg by mouth daily.    Marland Kitchen aspirin EC 81 MG tablet Take 81 mg by mouth daily.    Marland Kitchen FLUoxetine (PROZAC) 10 MG capsule Take 10 mg by mouth daily.    Marland Kitchen losartan (COZAAR) 100 MG tablet Take 50 mg by mouth 2 (two) times daily.     . pantoprazole (PROTONIX) 40 MG tablet Take 40 mg by mouth 2 (two) times daily.    . sucralfate (CARAFATE) 1 g tablet Take 1 g by mouth 2 (two) times daily.    Marland Kitchen triamcinolone (NASACORT ALLERGY 24HR) 55 MCG/ACT AERO nasal inhaler Place 2 sprays into the nose daily as needed (for allergies).     No current facility-administered medications for this visit.      Past Medical History:  Diagnosis Date  . Allergic rhinitis due to other allergen   . Anginal pain ([place]) 2011   hospitalized for CP in the past; due to low potassium  . Anxiety   . Arthritis   . Childhood asthma   . Chronic hoarseness   . Gastroparesis   . GERD (gastroesophageal reflux disease)    . History of hiatal hernia   . Hx of blood transfusion reaction    in the early 80s  . Hypertension   . Left bundle branch block     (abnormal heart rhythm)  . PONV (postoperative nausea and vomiting)   . Pyloric stenosis     ROS:   All systems reviewed and negative except as noted in the HPI.   Past Surgical History:  Procedure Laterality Date  . ABDOMINAL HYSTERECTOMY  1983  . CARDIAC CATHETERIZATION  10/2009   Cardiac cath normal coronary arteries  . CERVICAL DISC ARTHROPLASTY     disc replacement in neck 1998  . COLONOSCOPY     10 year repeat 09/26/2010  . ESOPHAGOGASTRODUODENOSCOPY  09/26/2010,01/08/13  . OTHER SURGICAL HISTORY  1983   hysterectomy ovaries intact, prolapsed   . OTHER SURGICAL HISTORY  2006   pyloric stricture surgery  . TOTAL HIP ARTHROPLASTY Right 06/07/2015   Procedure: TOTAL HIP ARTHROPLASTY ANTERIOR APPROACH;  Surgeon: Frederik Pear, MD;  Location: [facility];  Service: Orthopedics;  Laterality: Right;     Family History  Problem Relation Age of Onset  . Hypertension Mother   . Cirrhosis Father   . Hypertension Father   . OCD  Sister   . Depression Sister   . Depression Sister   . Hypertension Sister      Social History   Social History  . Marital status: Married    Spouse name: N/A  . Number of children: N/A  . Years of education: N/A   Occupational History  . Not on file.   Social History Main Topics  . Smoking status: Never Smoker  . Smokeless tobacco: Never Used  . Alcohol use No  . Drug use: No  . Sexual activity: Not on file   Other Topics Concern  . Not on file   Social History Narrative  . No narrative on file     BP 122/70 (BP Location: Left Arm, Patient Position: Sitting, Cuff Size: Normal)   Pulse 77   Ht 5\' 1"  (1.549 m)   Wt 131 lb 6.4 oz (59.6 kg)   SpO2 96%   BMI 24.83 kg/m   Physical Exam:  Well appearing 67 yo woman, NAD HEENT: Unremarkable Neck:  6 cm JVD, no thyromegally Lymphatics:  No  adenopathy Back:  No CVA tenderness Lungs:  Clear with no wheezes HEART:  Regular rate rhythm, no murmurs, no rubs, no clicks, split S2. Abd:  soft, positive bowel sounds, no organomegally, no rebound, no guarding Ext:  2 plus pulses, no edema, no cyanosis, no clubbing Skin:  No rashes no nodules Neuro:  CN II through XII intact, motor grossly intact  EKG - NSR with LBBB, QRS 124  DEVICE  Normal device function.  See PaceArt for details.   Assess/Plan: 1. 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 Her story has components of both. I have recommended insertion of an ILR. The risks/benefits/goals/expectations of the procedure were reviewed with the patient and she wishes to proceed. 2. HTN - her blood pressure is well controlled. No change in her meds. 3. Chronic bronchitis - she is currently improved. We discussed the findings which might result in her need for antibiotics. She will undergo watchful waiting.   Mikle Bosworth.D.

## 2016-05-29 NOTE — Interval H&P Note (Signed)
History and Physical Interval Note:  05/29/2016 7:37 AM  Danielle Garrett  has presented today for surgery, with the diagnosis of LBBB, near syncope  The various methods of treatment have been discussed with the patient and family. After consideration of risks, benefits and other options for treatment, the patient has consented to  Procedure(s): Loop Recorder Insertion (N/A) as a surgical intervention .  The patient's history has been reviewed, patient examined, no change in status, stable for surgery.  I have reviewed the patient's chart and labs.  Questions were answered to the patient's satisfaction.     [name]

## 2016-06-05 ENCOUNTER — Telehealth: Payer: Self-pay | Admitting: Internal Medicine

## 2016-06-05 NOTE — Telephone Encounter (Signed)
Called Danielle Garrett back, Pt stated that the incision site had bled last night and there was blood on the steri-strips, she asked if she could take the steri-strips off.  Informed her to leave steri-strips in place until wound check on 06/08/2016 to ensure the closure of the incision site. Pt voiced understanding.

## 2016-06-05 NOTE — Telephone Encounter (Signed)
New message    Pt calling to ask about wound and steristrips from device implant on Monday.

## 2016-06-08 ENCOUNTER — Ambulatory Visit (INDEPENDENT_AMBULATORY_CARE_PROVIDER_SITE_OTHER): Payer: Medicare Other | Admitting: *Deleted

## 2016-06-08 DIAGNOSIS — R55 Syncope and collapse: Secondary | ICD-10-CM

## 2016-06-08 LAB — CUP PACEART INCLINIC DEVICE CHECK
Date Time Interrogation Session: 20180222145255
Implantable Pulse Generator Implant Date: 20180212

## 2016-06-08 NOTE — Progress Notes (Signed)
Wound check appointment. Steri-strips removed. Scab removed with steri strips. Site cleaned, steri strips re-applied. Pt instructed to leave steri strips on for 1 week before removing. Wound without redness or edema. Battery status: good. R-waves 0.14mV. 0 symptom episodes, 0 tachy episodes, 0 pause episodes, 0 brady episodes. 0 AF episodes. Pt education complete including home monitor and s/sy of infection. Pt given device clinic number. Monthly summary reports and ROV with GT 5/22

## 2016-06-28 ENCOUNTER — Ambulatory Visit (INDEPENDENT_AMBULATORY_CARE_PROVIDER_SITE_OTHER): Payer: Medicare Other | Admitting: *Deleted

## 2016-06-28 DIAGNOSIS — R55 Syncope and collapse: Secondary | ICD-10-CM | POA: Diagnosis not present

## 2016-06-28 NOTE — Progress Notes (Signed)
Carelink Summary Report / Loop Recorder 

## 2016-07-28 ENCOUNTER — Ambulatory Visit (INDEPENDENT_AMBULATORY_CARE_PROVIDER_SITE_OTHER): Payer: Medicare Other | Admitting: *Deleted

## 2016-07-28 DIAGNOSIS — R55 Syncope and collapse: Secondary | ICD-10-CM | POA: Diagnosis not present

## 2016-07-28 NOTE — Progress Notes (Signed)
Carelink Summary Report / Loop Recorder 

## 2016-08-09 LAB — CUP PACEART REMOTE DEVICE CHECK: Date Time Interrogation Session: 20180413160607

## 2016-08-16 DIAGNOSIS — H04123 Dry eye syndrome of bilateral lacrimal glands: Secondary | ICD-10-CM | POA: Diagnosis not present

## 2016-08-21 ENCOUNTER — Encounter: Payer: Self-pay | Admitting: Internal Medicine

## 2016-08-28 ENCOUNTER — Ambulatory Visit (INDEPENDENT_AMBULATORY_CARE_PROVIDER_SITE_OTHER): Payer: Medicare Other | Admitting: *Deleted

## 2016-08-28 DIAGNOSIS — R55 Syncope and collapse: Secondary | ICD-10-CM | POA: Diagnosis not present

## 2016-08-28 NOTE — Progress Notes (Signed)
Carelink Summary Report / Loop Recorder 

## 2016-09-01 LAB — CUP PACEART REMOTE DEVICE CHECK: Date Time Interrogation Session: 20180513163549

## 2016-09-05 ENCOUNTER — Encounter: Payer: Medicare Other | Admitting: Internal Medicine

## 2016-09-13 DIAGNOSIS — J209 Acute bronchitis, unspecified: Secondary | ICD-10-CM | POA: Diagnosis not present

## 2016-09-26 ENCOUNTER — Ambulatory Visit (INDEPENDENT_AMBULATORY_CARE_PROVIDER_SITE_OTHER): Payer: Medicare Other | Admitting: *Deleted

## 2016-09-26 DIAGNOSIS — R55 Syncope and collapse: Secondary | ICD-10-CM

## 2016-09-27 NOTE — Progress Notes (Signed)
Carelink Summary Report / Loop Recorder 

## 2016-10-08 LAB — CUP PACEART REMOTE DEVICE CHECK: Date Time Interrogation Session: 20180612163819

## 2016-10-08 NOTE — Progress Notes (Signed)
Carelink summary report received. Battery status OK. Normal device function. No new symptom episodes, tachy episodes, brady, or pause episodes. No new AF episodes. Monthly summary reports and ROV/PRN 

## 2016-10-26 ENCOUNTER — Ambulatory Visit (INDEPENDENT_AMBULATORY_CARE_PROVIDER_SITE_OTHER): Payer: Medicare Other | Admitting: *Deleted

## 2016-10-26 DIAGNOSIS — R55 Syncope and collapse: Secondary | ICD-10-CM | POA: Diagnosis not present

## 2016-10-27 NOTE — Progress Notes (Signed)
Carelink Summary Report / Loop Recorder 

## 2016-10-31 DIAGNOSIS — H903 Sensorineural hearing loss, bilateral: Secondary | ICD-10-CM | POA: Diagnosis not present

## 2016-10-31 DIAGNOSIS — H9313 Tinnitus, bilateral: Secondary | ICD-10-CM | POA: Diagnosis not present

## 2016-11-03 LAB — CUP PACEART REMOTE DEVICE CHECK: Date Time Interrogation Session: 20180712181459

## 2016-11-09 DIAGNOSIS — H9313 Tinnitus, bilateral: Secondary | ICD-10-CM | POA: Diagnosis not present

## 2016-11-09 DIAGNOSIS — H903 Sensorineural hearing loss, bilateral: Secondary | ICD-10-CM | POA: Diagnosis not present

## 2016-11-14 ENCOUNTER — Ambulatory Visit (INDEPENDENT_AMBULATORY_CARE_PROVIDER_SITE_OTHER): Payer: Medicare Other | Admitting: Internal Medicine

## 2016-11-14 ENCOUNTER — Encounter: Payer: Self-pay | Admitting: Internal Medicine

## 2016-11-14 ENCOUNTER — Encounter (INDEPENDENT_AMBULATORY_CARE_PROVIDER_SITE_OTHER): Payer: Self-pay

## 2016-11-14 VITALS — BP 130/78 | HR 69 | Ht 60.25 in | Wt 122.0 lb

## 2016-11-14 DIAGNOSIS — R55 Syncope and collapse: Secondary | ICD-10-CM | POA: Diagnosis not present

## 2016-11-14 DIAGNOSIS — I447 Left bundle-branch block, unspecified: Secondary | ICD-10-CM | POA: Diagnosis not present

## 2016-11-14 NOTE — Progress Notes (Signed)
HPI Danielle Garrett returns today for ongoing followup of syncope and is s/p ILR insertion. The patient also has a h/o LBBB, and HTN. In the interim, she has done well. No syncope. No edema.  Allergies  Allergen Reactions  . Hydrochlorothiazide Nausea Only  . Adhesive [Tape]     Pt states ' it took my skin off' Steri-Strips  . Codeine Nausea And Vomiting  . Penicillins Other (See Comments)    Unknown  . Sulfa Antibiotics Nausea And Vomiting  . Vicodin [Hydrocodone-Acetaminophen] Nausea And Vomiting and Other (See Comments)    Did not feel like herself     Current Outpatient Prescriptions  Medication Sig Dispense Refill  . albuterol (PROVENTIL HFA;VENTOLIN HFA) 108 (90 Base) MCG/ACT inhaler Inhale 1-2 puffs into the lungs every 4 (four) hours as needed for wheezing or shortness of breath.    Marland Kitchen amLODipine (NORVASC) 2.5 MG tablet Take 2.5 mg by mouth daily.    Marland Kitchen aspirin EC 81 MG tablet Take 81 mg by mouth daily.    Marland Kitchen atorvastatin (LIPITOR) 10 MG tablet Take 10 mg by mouth daily.    . calcium acetate (PHOSLO) 667 MG capsule Take by mouth 3 (three) times daily with meals.    . Calcium Carb-Cholecalciferol (CALCIUM 500 + D3 PO) Take 1 tablet by mouth daily.    . cholecalciferol (VITAMIN D) 1000 units tablet Take 1,000 Units by mouth daily.    . Coenzyme Q10 (COQ10) 100 MG CAPS Take 1 capsule by mouth daily.    Marland Kitchen FLUoxetine (PROZAC) 10 MG capsule Take 10 mg by mouth daily.    Marland Kitchen loratadine (CLARITIN) 10 MG tablet Take 10 mg by mouth daily.    Marland Kitchen losartan (COZAAR) 50 MG tablet Take 50 mg by mouth 2 (two) times daily.     . pantoprazole (PROTONIX) 40 MG tablet Take 40 mg by mouth 2 (two) times daily.    . sucralfate (CARAFATE) 1 g tablet Take 1 g by mouth 2 (two) times daily as needed (Takes if Pantoprazole is not working).     . triamcinolone (NASACORT ALLERGY 24HR) 55 MCG/ACT AERO nasal inhaler Place 2 sprays into the nose daily as needed (for allergies).     No current  facility-administered medications for this visit.      Past Medical History:  Diagnosis Date  . Allergic rhinitis due to other allergen   . Anginal pain ([place]) 2011   hospitalized for CP in the past; due to low potassium  . Anxiety   . Arthritis   . Childhood asthma   . Chronic hoarseness   . Gastroparesis   . GERD (gastroesophageal reflux disease)   . History of hiatal hernia   . Hx of blood transfusion reaction    in the early 80s  . Hypertension   . Left bundle branch block     (abnormal heart rhythm)  . PONV (postoperative nausea and vomiting)   . Pyloric stenosis     ROS:   All systems reviewed and negative except as noted in the HPI.   Past Surgical History:  Procedure Laterality Date  . ABDOMINAL HYSTERECTOMY  1983  . CARDIAC CATHETERIZATION  10/2009   Cardiac cath normal coronary arteries  . CERVICAL DISC ARTHROPLASTY     disc replacement in neck 1998  . COLONOSCOPY     10 year repeat 09/26/2010  . ESOPHAGOGASTRODUODENOSCOPY  09/26/2010,01/08/13  . LOOP RECORDER INSERTION N/A 05/29/2016   Procedure: Loop Recorder Insertion;  Surgeon:  Evans Lance, MD;  Location: [facility];  Service: Cardiovascular;  Laterality: N/A;  . OTHER SURGICAL HISTORY  1983   hysterectomy ovaries intact, prolapsed   . OTHER SURGICAL HISTORY  2006   pyloric stricture surgery  . TOTAL HIP ARTHROPLASTY Right 06/07/2015   Procedure: TOTAL HIP ARTHROPLASTY ANTERIOR APPROACH;  Surgeon: Frederik Pear, MD;  Location: [facility];  Service: Orthopedics;  Laterality: Right;     Family History  Problem Relation Age of Onset  . Hypertension Mother   . Cirrhosis Father   . Hypertension Father   . OCD Sister   . Depression Sister   . Depression Sister   . Hypertension Sister      Social History   Social History  . Marital status: Married    Spouse name: N/A  . Number of children: N/A  . Years of education: N/A   Occupational History  . Not on file.   Social History Main Topics    . Smoking status: Never Smoker  . Smokeless tobacco: Never Used  . Alcohol use No  . Drug use: No  . Sexual activity: Not on file   Other Topics Concern  . Not on file   Social History Narrative  . No narrative on file     BP 130/78   Pulse 69   Ht 5' 0.25" (1.53 m)   Wt 122 lb (55.3 kg)   SpO2 96%   BMI 23.63 kg/m   Physical Exam:  Well appearing NAD HEENT: Unremarkable Neck:  No JVD, no thyromegally Lymphatics:  No adenopathy Back:  No CVA tenderness Lungs:  Clear HEART:  Regular rate rhythm, no murmurs, no rubs, no clicks Abd:  soft, positive bowel sounds, no organomegally, no rebound, no guarding Ext:  2 plus pulses, no edema, no cyanosis, no clubbing Skin:  No rashes no nodules Neuro:  CN II through XII intact, motor grossly intact  DEVICE  Normal device function.  See PaceArt for details.   Assess/Plan: 1. Syncope - no recurrent syncope. She will undergo watchful waiting. 2. LBBB - I have told her I suspect she will have a bradycardic episode and if so she will undergo PPM insertion. She was not inclined to have a PPM initially. 3. HTN - her blood pressure is reasonably well controlled.  Mikle Bosworth.D.

## 2016-11-14 NOTE — Patient Instructions (Signed)
Medication Instructions:  Your physician recommends that you continue on your current medications as directed. Please refer to the Current Medication list given to you today.   Labwork: None ordered.   Testing/Procedures: None ordered.   Follow-Up: Your physician wants you to follow-up in: one year with Dr. Lovena Le.  You will receive a reminder letter in the mail two months in advance. If you don't receive a letter, please call our office to schedule the follow-up appointment.  Remote monitoring is used to monitor your ICD from home. This monitoring reduces the number of office visits required to check your device to one time per year. It allows [place] to keep an eye on the functioning of your device to ensure it is working properly. You are scheduled for a device check from home on 11/27/2016. You may send your transmission at any time that day. If you have a wireless device, the transmission will be sent automatically. After your physician reviews your transmission, you will receive a postcard with your next transmission date.    Any Other Special Instructions Will Be Listed Below (If Applicable).     If you need a refill on your cardiac medications before your next appointment, please call your pharmacy.

## 2016-11-22 LAB — CUP PACEART INCLINIC DEVICE CHECK
Date Time Interrogation Session: 20180731172806
Implantable Pulse Generator Implant Date: 20180212

## 2016-11-27 ENCOUNTER — Ambulatory Visit (INDEPENDENT_AMBULATORY_CARE_PROVIDER_SITE_OTHER): Payer: Medicare Other | Admitting: *Deleted

## 2016-11-27 DIAGNOSIS — R55 Syncope and collapse: Secondary | ICD-10-CM

## 2016-12-02 LAB — CUP PACEART REMOTE DEVICE CHECK
Date Time Interrogation Session: 20180818101717
Implantable Pulse Generator Implant Date: 20180212

## 2016-12-02 NOTE — Progress Notes (Signed)
Loop recorder summary reprot

## 2016-12-04 DIAGNOSIS — N3000 Acute cystitis without hematuria: Secondary | ICD-10-CM | POA: Diagnosis not present

## 2016-12-25 ENCOUNTER — Ambulatory Visit (INDEPENDENT_AMBULATORY_CARE_PROVIDER_SITE_OTHER): Payer: Medicare Other | Admitting: *Deleted

## 2016-12-25 DIAGNOSIS — R55 Syncope and collapse: Secondary | ICD-10-CM

## 2016-12-26 NOTE — Progress Notes (Signed)
Carelink Summary Report / Loop Recorder 

## 2016-12-28 LAB — CUP PACEART REMOTE DEVICE CHECK
Date Time Interrogation Session: 20180913161028
Implantable Pulse Generator Implant Date: 20180212

## 2017-01-24 ENCOUNTER — Ambulatory Visit (INDEPENDENT_AMBULATORY_CARE_PROVIDER_SITE_OTHER): Payer: Medicare Other | Admitting: *Deleted

## 2017-01-24 DIAGNOSIS — R55 Syncope and collapse: Secondary | ICD-10-CM | POA: Diagnosis not present

## 2017-01-25 NOTE — Progress Notes (Signed)
Carelink Summary Report / Loop Recorder 

## 2017-01-30 LAB — CUP PACEART REMOTE DEVICE CHECK
Date Time Interrogation Session: 20181016085755
Implantable Pulse Generator Implant Date: 20180212

## 2017-02-15 DIAGNOSIS — N390 Urinary tract infection, site not specified: Secondary | ICD-10-CM | POA: Diagnosis not present

## 2017-02-21 DIAGNOSIS — F4321 Adjustment disorder with depressed mood: Secondary | ICD-10-CM | POA: Diagnosis not present

## 2017-02-21 DIAGNOSIS — N390 Urinary tract infection, site not specified: Secondary | ICD-10-CM | POA: Diagnosis not present

## 2017-02-21 DIAGNOSIS — E785 Hyperlipidemia, unspecified: Secondary | ICD-10-CM | POA: Diagnosis not present

## 2017-02-21 DIAGNOSIS — N952 Postmenopausal atrophic vaginitis: Secondary | ICD-10-CM | POA: Diagnosis not present

## 2017-02-21 DIAGNOSIS — I10 Essential (primary) hypertension: Secondary | ICD-10-CM | POA: Diagnosis not present

## 2017-02-21 DIAGNOSIS — N393 Stress incontinence (female) (male): Secondary | ICD-10-CM | POA: Diagnosis not present

## 2017-02-21 DIAGNOSIS — Z23 Encounter for immunization: Secondary | ICD-10-CM | POA: Diagnosis not present

## 2017-02-23 ENCOUNTER — Ambulatory Visit (INDEPENDENT_AMBULATORY_CARE_PROVIDER_SITE_OTHER): Payer: Medicare Other | Admitting: *Deleted

## 2017-02-23 DIAGNOSIS — R55 Syncope and collapse: Secondary | ICD-10-CM

## 2017-02-26 ENCOUNTER — Telehealth: Payer: Self-pay

## 2017-02-26 NOTE — Progress Notes (Signed)
Carelink Summary Report / Loop Recorder 

## 2017-02-26 NOTE — Telephone Encounter (Signed)
LVM - need patient to send manual transmission and to discuss correlating symptoms with episodes

## 2017-02-26 NOTE — Telephone Encounter (Signed)
Spoke with patient who successfully sent a manual transmission. She states that she has had several episodes of her legs getting weak and feelings of dizziness but she has not passed out. She was unable to tell me days or times of theses episodes and verbalizes poor tracking of her symptoms. I re-educated on importance of tracking her symptoms so that we could better identify any correlations between ILR episodes and her symptoms. She verbalized understanding.

## 2017-03-01 LAB — CUP PACEART REMOTE DEVICE CHECK
Date Time Interrogation Session: 20181115105720
Implantable Pulse Generator Implant Date: 20180212

## 2017-03-05 ENCOUNTER — Telehealth: Payer: Self-pay | Admitting: *Deleted

## 2017-03-05 NOTE — Telephone Encounter (Signed)
Spoke with patient.  Assisted her with sending a manual Carelink transmission for review.  Received alert for 4 "tachy" episodes, but available ECG shows TWOS.  Additional tachy episodes reviewed--all ECGs suggest sinus tach with TWOS from 11/18 between 0758-0806 (time may be slightly off due to Daylight Savings).  Patient reports she slept in yesterday morning and was asymptomatic with episodes.  Encouraged patient to use symptom activator in the event of presyncopal/syncopal symptoms.  Patient reports that she lost her symptom activator awhile ago.  Advised patient that I will order her a new activator from [employer] and she is agreeable.  Patient denies additional questions or concerns at this time.

## 2017-03-05 NOTE — Telephone Encounter (Signed)
New symptom activator ordered via Carelink--should arrive in 3-5 business days to patient's home address.

## 2017-03-26 ENCOUNTER — Ambulatory Visit (INDEPENDENT_AMBULATORY_CARE_PROVIDER_SITE_OTHER): Payer: Medicare Other | Admitting: *Deleted

## 2017-03-26 DIAGNOSIS — R55 Syncope and collapse: Secondary | ICD-10-CM

## 2017-03-26 NOTE — Progress Notes (Signed)
Carelink Summary Report / Loop Recorder 

## 2017-04-02 LAB — CUP PACEART REMOTE DEVICE CHECK
Date Time Interrogation Session: 20181217094108
Implantable Pulse Generator Implant Date: 20180212

## 2017-04-24 ENCOUNTER — Ambulatory Visit (INDEPENDENT_AMBULATORY_CARE_PROVIDER_SITE_OTHER): Payer: Medicare Other | Admitting: *Deleted

## 2017-04-24 DIAGNOSIS — R55 Syncope and collapse: Secondary | ICD-10-CM

## 2017-04-25 NOTE — Progress Notes (Signed)
Carelink Summary Report / Loop Recorder 

## 2017-04-29 DIAGNOSIS — I10 Essential (primary) hypertension: Secondary | ICD-10-CM | POA: Diagnosis not present

## 2017-04-29 DIAGNOSIS — N3001 Acute cystitis with hematuria: Secondary | ICD-10-CM | POA: Diagnosis not present

## 2017-05-07 DIAGNOSIS — K219 Gastro-esophageal reflux disease without esophagitis: Secondary | ICD-10-CM | POA: Diagnosis not present

## 2017-05-07 DIAGNOSIS — Z0000 Encounter for general adult medical examination without abnormal findings: Secondary | ICD-10-CM | POA: Diagnosis not present

## 2017-05-07 DIAGNOSIS — Z1389 Encounter for screening for other disorder: Secondary | ICD-10-CM | POA: Diagnosis not present

## 2017-05-07 DIAGNOSIS — E785 Hyperlipidemia, unspecified: Secondary | ICD-10-CM | POA: Diagnosis not present

## 2017-05-07 DIAGNOSIS — F4321 Adjustment disorder with depressed mood: Secondary | ICD-10-CM | POA: Diagnosis not present

## 2017-05-07 DIAGNOSIS — K5909 Other constipation: Secondary | ICD-10-CM | POA: Diagnosis not present

## 2017-05-07 DIAGNOSIS — I10 Essential (primary) hypertension: Secondary | ICD-10-CM | POA: Diagnosis not present

## 2017-05-07 DIAGNOSIS — Z136 Encounter for screening for cardiovascular disorders: Secondary | ICD-10-CM | POA: Diagnosis not present

## 2017-05-07 DIAGNOSIS — N393 Stress incontinence (female) (male): Secondary | ICD-10-CM | POA: Diagnosis not present

## 2017-05-07 LAB — CUP PACEART REMOTE DEVICE CHECK
Date Time Interrogation Session: 20190121133451
Implantable Pulse Generator Implant Date: 20180212

## 2017-05-24 ENCOUNTER — Ambulatory Visit (INDEPENDENT_AMBULATORY_CARE_PROVIDER_SITE_OTHER): Payer: Medicare Other | Admitting: *Deleted

## 2017-05-24 DIAGNOSIS — R55 Syncope and collapse: Secondary | ICD-10-CM

## 2017-05-28 NOTE — Progress Notes (Signed)
Carelink Summary Report / Loop Recorder 

## 2017-05-31 DIAGNOSIS — R110 Nausea: Secondary | ICD-10-CM | POA: Diagnosis not present

## 2017-05-31 DIAGNOSIS — K5904 Chronic idiopathic constipation: Secondary | ICD-10-CM | POA: Diagnosis not present

## 2017-06-01 DIAGNOSIS — N393 Stress incontinence (female) (male): Secondary | ICD-10-CM | POA: Diagnosis not present

## 2017-06-01 DIAGNOSIS — N3281 Overactive bladder: Secondary | ICD-10-CM | POA: Diagnosis not present

## 2017-06-01 DIAGNOSIS — N952 Postmenopausal atrophic vaginitis: Secondary | ICD-10-CM | POA: Diagnosis not present

## 2017-06-01 DIAGNOSIS — N3000 Acute cystitis without hematuria: Secondary | ICD-10-CM | POA: Diagnosis not present

## 2017-06-04 DIAGNOSIS — F4321 Adjustment disorder with depressed mood: Secondary | ICD-10-CM | POA: Diagnosis not present

## 2017-06-04 DIAGNOSIS — I10 Essential (primary) hypertension: Secondary | ICD-10-CM | POA: Diagnosis not present

## 2017-06-04 DIAGNOSIS — J45909 Unspecified asthma, uncomplicated: Secondary | ICD-10-CM | POA: Diagnosis not present

## 2017-06-04 DIAGNOSIS — E785 Hyperlipidemia, unspecified: Secondary | ICD-10-CM | POA: Diagnosis not present

## 2017-06-19 LAB — CUP PACEART REMOTE DEVICE CHECK
Date Time Interrogation Session: 20190305101233
Implantable Pulse Generator Implant Date: 20180212

## 2017-06-21 ENCOUNTER — Other Ambulatory Visit: Payer: Self-pay | Admitting: Internal Medicine

## 2017-06-26 ENCOUNTER — Ambulatory Visit (INDEPENDENT_AMBULATORY_CARE_PROVIDER_SITE_OTHER): Payer: Medicare Other | Admitting: *Deleted

## 2017-06-26 DIAGNOSIS — R55 Syncope and collapse: Secondary | ICD-10-CM

## 2017-06-27 NOTE — Progress Notes (Signed)
Carelink Summary Report / Loop Recorder 

## 2017-07-12 DIAGNOSIS — K5904 Chronic idiopathic constipation: Secondary | ICD-10-CM | POA: Diagnosis not present

## 2017-07-30 ENCOUNTER — Ambulatory Visit (INDEPENDENT_AMBULATORY_CARE_PROVIDER_SITE_OTHER): Payer: Medicare Other | Admitting: *Deleted

## 2017-07-30 DIAGNOSIS — R55 Syncope and collapse: Secondary | ICD-10-CM | POA: Diagnosis not present

## 2017-07-30 NOTE — Progress Notes (Signed)
Carelink Summary Report / Loop Recorder 

## 2017-08-06 LAB — CUP PACEART REMOTE DEVICE CHECK
Date Time Interrogation Session: 20190422162701
Implantable Pulse Generator Implant Date: 20180212

## 2017-08-27 DIAGNOSIS — J019 Acute sinusitis, unspecified: Secondary | ICD-10-CM | POA: Diagnosis not present

## 2017-08-29 LAB — CUP PACEART REMOTE DEVICE CHECK
Date Time Interrogation Session: 20190515111821
Implantable Pulse Generator Implant Date: 20180212

## 2017-08-31 ENCOUNTER — Ambulatory Visit (INDEPENDENT_AMBULATORY_CARE_PROVIDER_SITE_OTHER): Payer: Medicare Other | Admitting: *Deleted

## 2017-08-31 DIAGNOSIS — R55 Syncope and collapse: Secondary | ICD-10-CM | POA: Diagnosis not present

## 2017-09-03 NOTE — Progress Notes (Signed)
Carelink Summary Report / Loop Recorder 

## 2017-09-25 LAB — CUP PACEART REMOTE DEVICE CHECK
Date Time Interrogation Session: 20190611090155
Implantable Pulse Generator Implant Date: 20180212

## 2017-10-03 ENCOUNTER — Ambulatory Visit (INDEPENDENT_AMBULATORY_CARE_PROVIDER_SITE_OTHER): Payer: Medicare Other | Admitting: *Deleted

## 2017-10-03 DIAGNOSIS — R55 Syncope and collapse: Secondary | ICD-10-CM | POA: Diagnosis not present

## 2017-10-04 NOTE — Progress Notes (Signed)
Carelink Summary Report / Loop Recorder 

## 2017-11-05 DIAGNOSIS — I10 Essential (primary) hypertension: Secondary | ICD-10-CM | POA: Diagnosis not present

## 2017-11-05 DIAGNOSIS — R202 Paresthesia of skin: Secondary | ICD-10-CM | POA: Diagnosis not present

## 2017-11-05 DIAGNOSIS — Z1211 Encounter for screening for malignant neoplasm of colon: Secondary | ICD-10-CM | POA: Diagnosis not present

## 2017-11-05 DIAGNOSIS — R269 Unspecified abnormalities of gait and mobility: Secondary | ICD-10-CM | POA: Diagnosis not present

## 2017-11-05 DIAGNOSIS — F4321 Adjustment disorder with depressed mood: Secondary | ICD-10-CM | POA: Diagnosis not present

## 2017-11-05 DIAGNOSIS — E785 Hyperlipidemia, unspecified: Secondary | ICD-10-CM | POA: Diagnosis not present

## 2017-11-06 ENCOUNTER — Ambulatory Visit (INDEPENDENT_AMBULATORY_CARE_PROVIDER_SITE_OTHER): Payer: Medicare Other | Admitting: *Deleted

## 2017-11-06 DIAGNOSIS — R55 Syncope and collapse: Secondary | ICD-10-CM | POA: Diagnosis not present

## 2017-11-06 NOTE — Progress Notes (Signed)
Carelink Summary Report / Loop Recorder 

## 2017-11-07 LAB — CUP PACEART REMOTE DEVICE CHECK
Date Time Interrogation Session: 20190724163222
Implantable Pulse Generator Implant Date: 20180212

## 2017-11-14 DIAGNOSIS — E538 Deficiency of other specified B group vitamins: Secondary | ICD-10-CM | POA: Diagnosis not present

## 2017-11-22 DIAGNOSIS — E538 Deficiency of other specified B group vitamins: Secondary | ICD-10-CM | POA: Diagnosis not present

## 2017-11-28 DIAGNOSIS — E538 Deficiency of other specified B group vitamins: Secondary | ICD-10-CM | POA: Diagnosis not present

## 2017-12-01 ENCOUNTER — Emergency Department (HOSPITAL_COMMUNITY)
Admission: EM | Admit: 2017-12-01 | Discharge: 2017-12-01 | Disposition: A | Discharge status: Home / Self Care | Payer: Medicare Other | Attending: Emergency Medicine | Admitting: Emergency Medicine

## 2017-12-01 ENCOUNTER — Emergency Department (HOSPITAL_COMMUNITY): Payer: Medicare Other

## 2017-12-01 DIAGNOSIS — R112 Nausea with vomiting, unspecified: Secondary | ICD-10-CM | POA: Diagnosis not present

## 2017-12-01 DIAGNOSIS — I10 Essential (primary) hypertension: Secondary | ICD-10-CM | POA: Insufficient documentation

## 2017-12-01 DIAGNOSIS — S4991XA Unspecified injury of right shoulder and upper arm, initial encounter: Secondary | ICD-10-CM | POA: Diagnosis not present

## 2017-12-01 DIAGNOSIS — Z23 Encounter for immunization: Secondary | ICD-10-CM | POA: Insufficient documentation

## 2017-12-01 DIAGNOSIS — W19XXXA Unspecified fall, initial encounter: Secondary | ICD-10-CM

## 2017-12-01 DIAGNOSIS — S80211A Abrasion, right knee, initial encounter: Secondary | ICD-10-CM | POA: Diagnosis not present

## 2017-12-01 DIAGNOSIS — Z79899 Other long term (current) drug therapy: Secondary | ICD-10-CM | POA: Diagnosis not present

## 2017-12-01 DIAGNOSIS — Z7982 Long term (current) use of aspirin: Secondary | ICD-10-CM | POA: Insufficient documentation

## 2017-12-01 DIAGNOSIS — S8991XA Unspecified injury of right lower leg, initial encounter: Secondary | ICD-10-CM | POA: Diagnosis not present

## 2017-12-01 DIAGNOSIS — M25511 Pain in right shoulder: Secondary | ICD-10-CM | POA: Diagnosis not present

## 2017-12-01 DIAGNOSIS — I447 Left bundle-branch block, unspecified: Secondary | ICD-10-CM | POA: Diagnosis not present

## 2017-12-01 DIAGNOSIS — M25571 Pain in right ankle and joints of right foot: Secondary | ICD-10-CM | POA: Diagnosis not present

## 2017-12-01 DIAGNOSIS — R1111 Vomiting without nausea: Secondary | ICD-10-CM | POA: Diagnosis not present

## 2017-12-01 DIAGNOSIS — S99911A Unspecified injury of right ankle, initial encounter: Secondary | ICD-10-CM | POA: Diagnosis not present

## 2017-12-01 DIAGNOSIS — M25561 Pain in right knee: Secondary | ICD-10-CM | POA: Diagnosis not present

## 2017-12-01 IMAGING — DX DG ANKLE COMPLETE 3+V*R*
3 series · 3 of 3 positions shown · non-contrast
Comparison: None.

CLINICAL DATA: Pain following fall

EXAM:
RIGHT ANKLE - COMPLETE 3+ VIEW

[ankle ap]
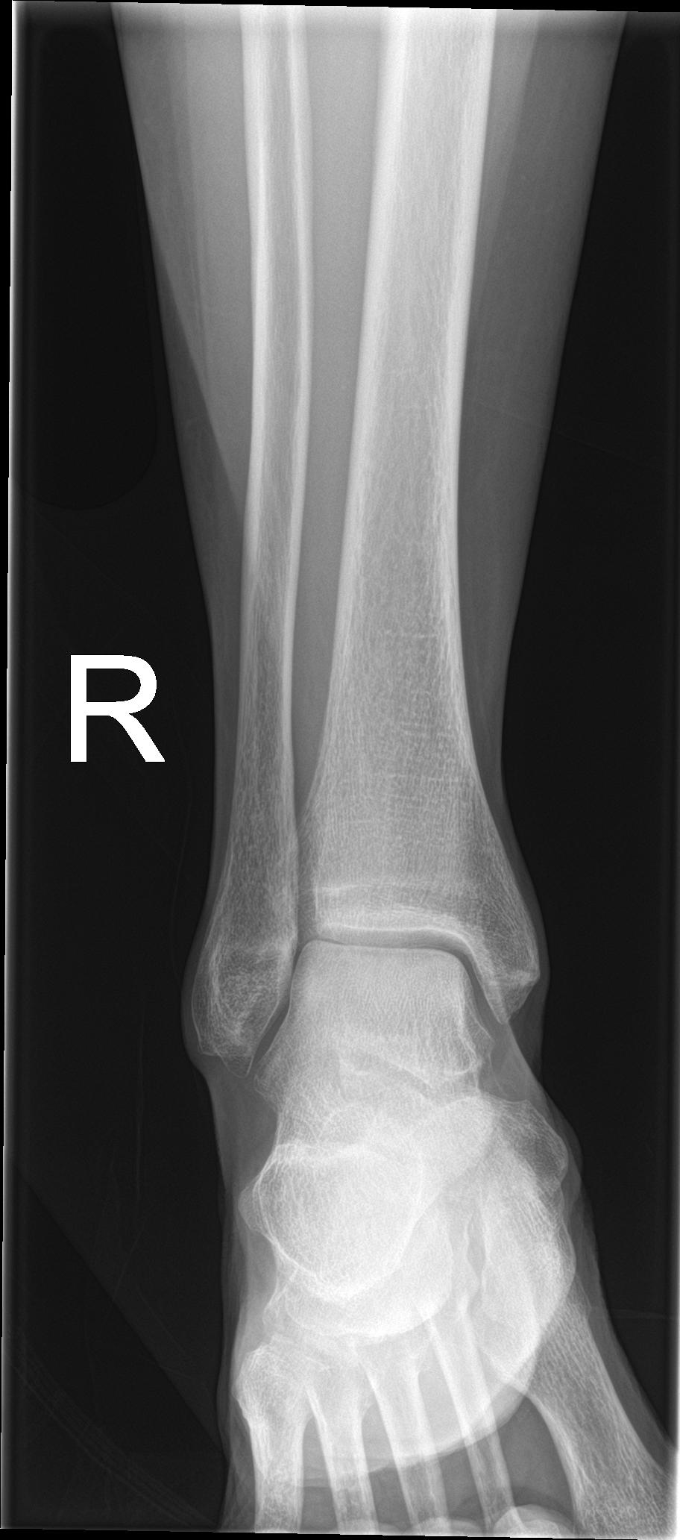

[ankle obl]
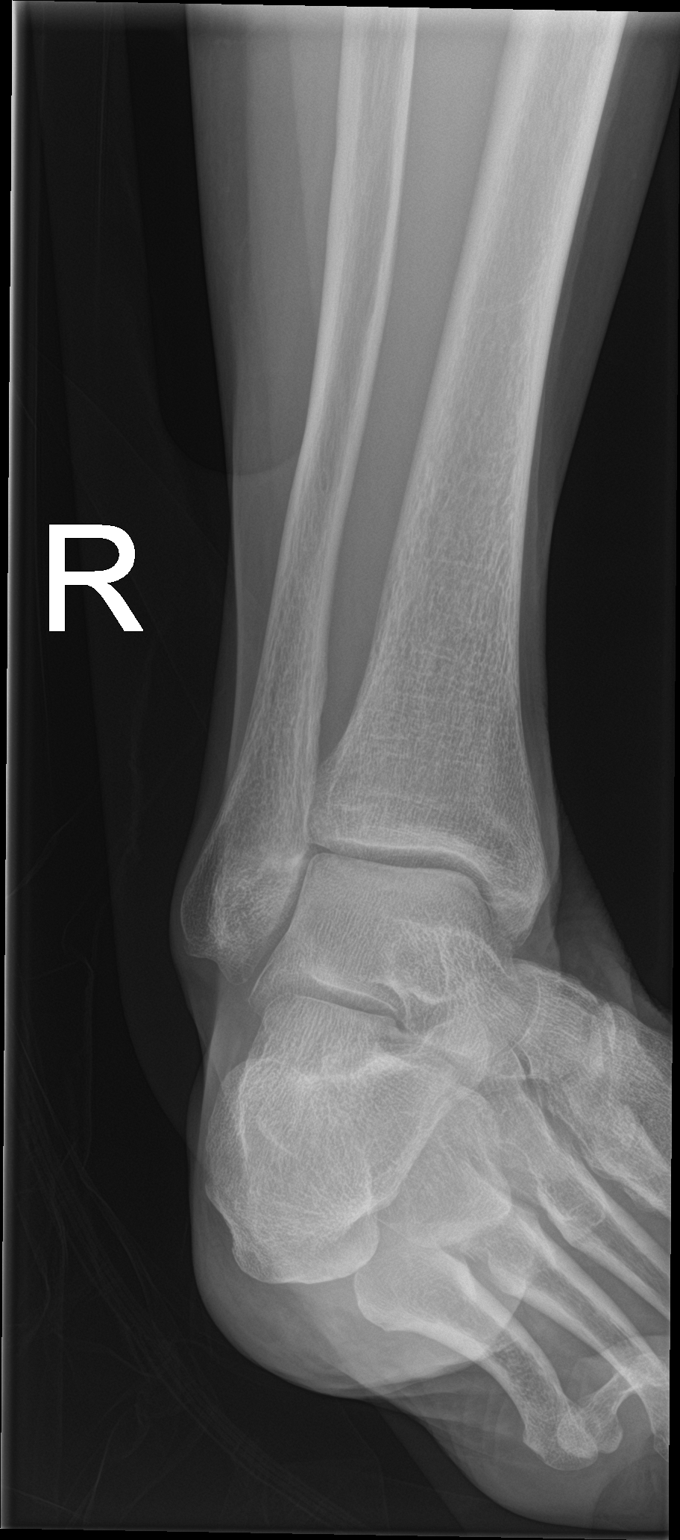

[ankle lat]
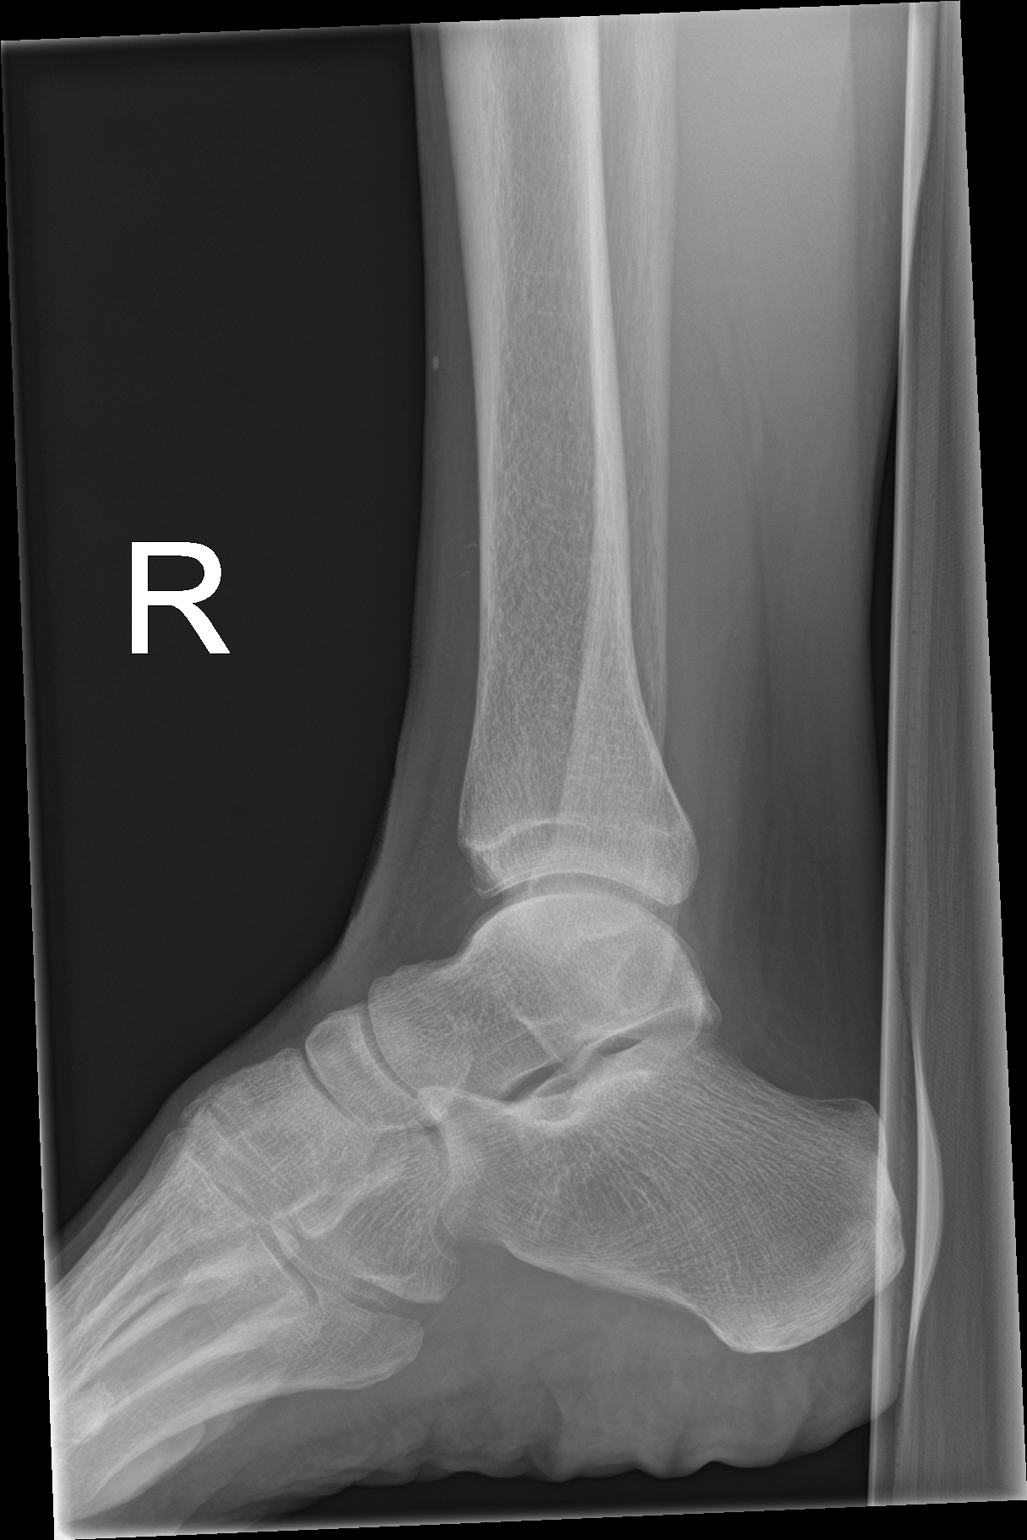

[3 of 3 positions shown; findings below may reference images not displayed]

FINDINGS: Frontal, oblique, and lateral views were obtained. There is no
fracture or joint effusion. Joint spaces appear normal. No erosive
change. Ankle mortise appears intact.
IMPRESSION: No evident fracture or arthropathy.  Ankle mortise appears intact.

## 2017-12-01 IMAGING — DX DG SHOULDER 2+V*R*
2 series · 2 of 2 positions shown · non-contrast
Comparison: None.

CLINICAL DATA: Pain following fall

EXAM:
RIGHT SHOULDER - 2+ VIEW

[shoulder grashey]
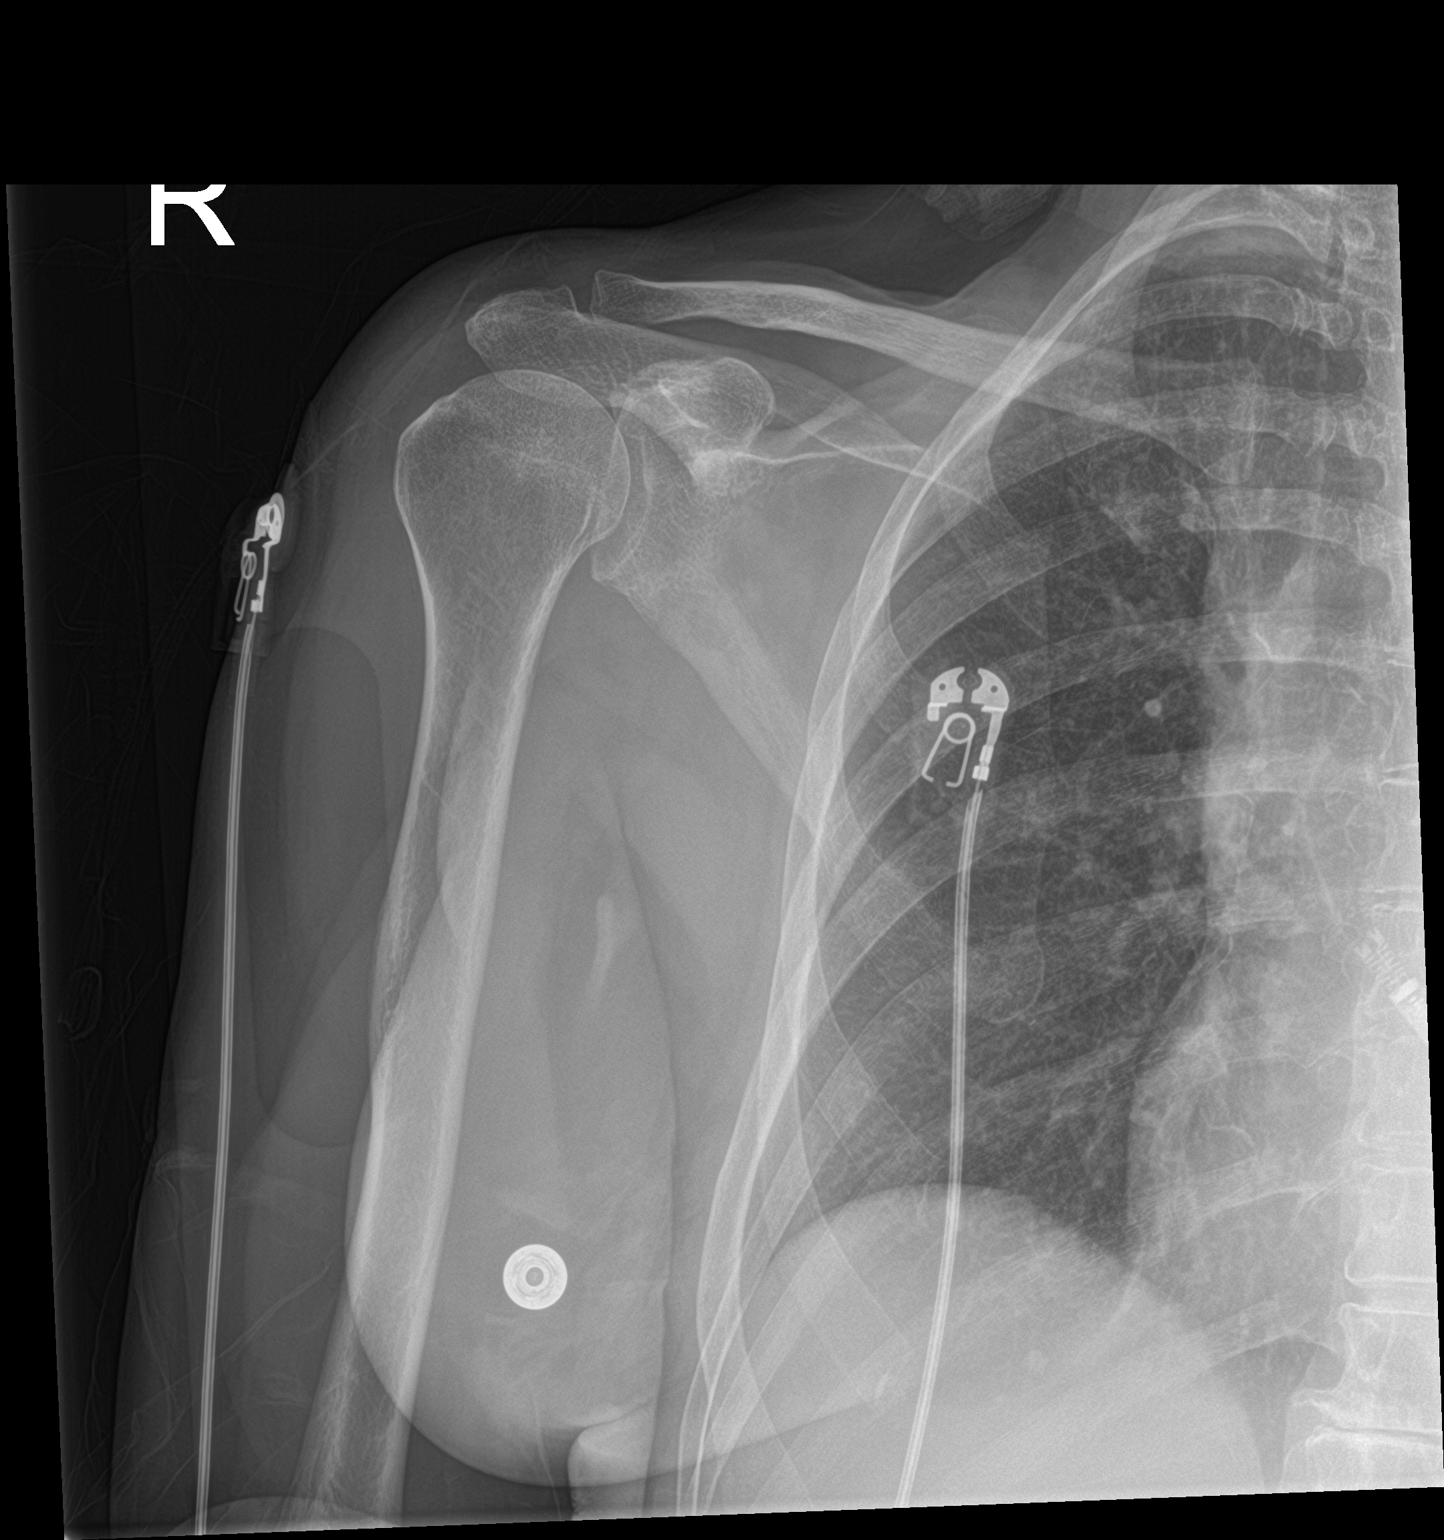

[shoulder y view]
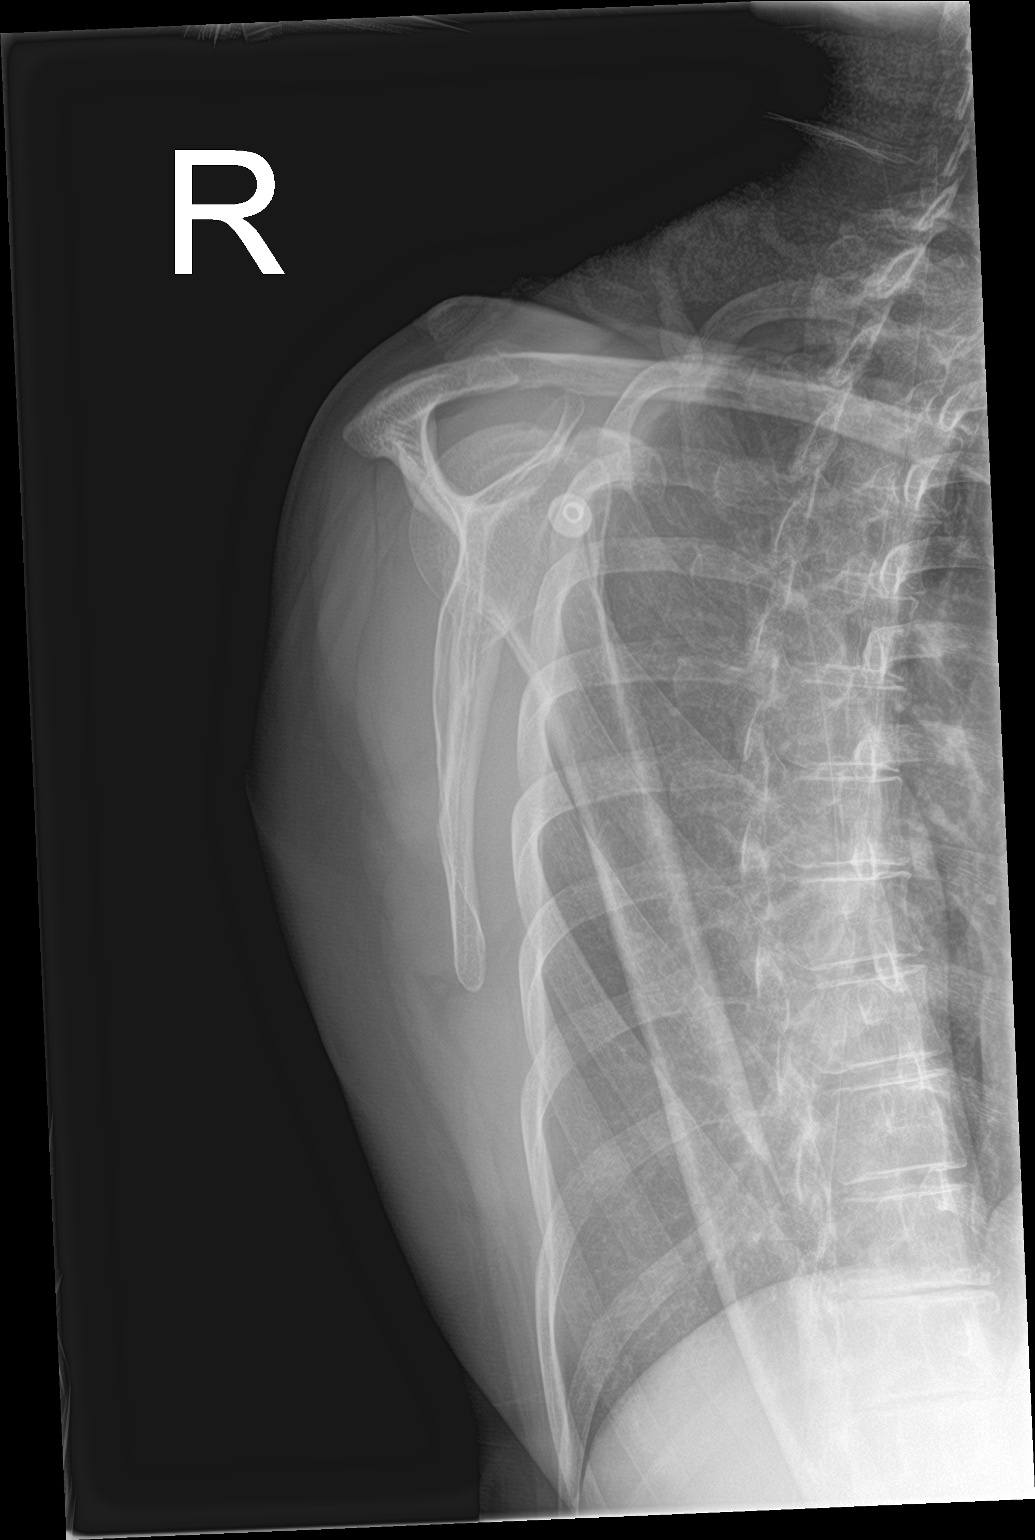

[2 of 2 positions shown; findings below may reference images not displayed]

FINDINGS: Frontal and Y scapular images were obtained. No fracture or
dislocation. Joint spaces appear unremarkable. No erosive change.
Visualized right lung clear.
IMPRESSION: No fracture or dislocation.  No evident arthropathy.

## 2017-12-01 IMAGING — DX DG KNEE COMPLETE 4+V*R*
4 series · 4 of 4 positions shown · non-contrast
Comparison: None.

CLINICAL DATA: Pain following fall

EXAM:
RIGHT KNEE - COMPLETE 4+ VIEW

[knee ap]
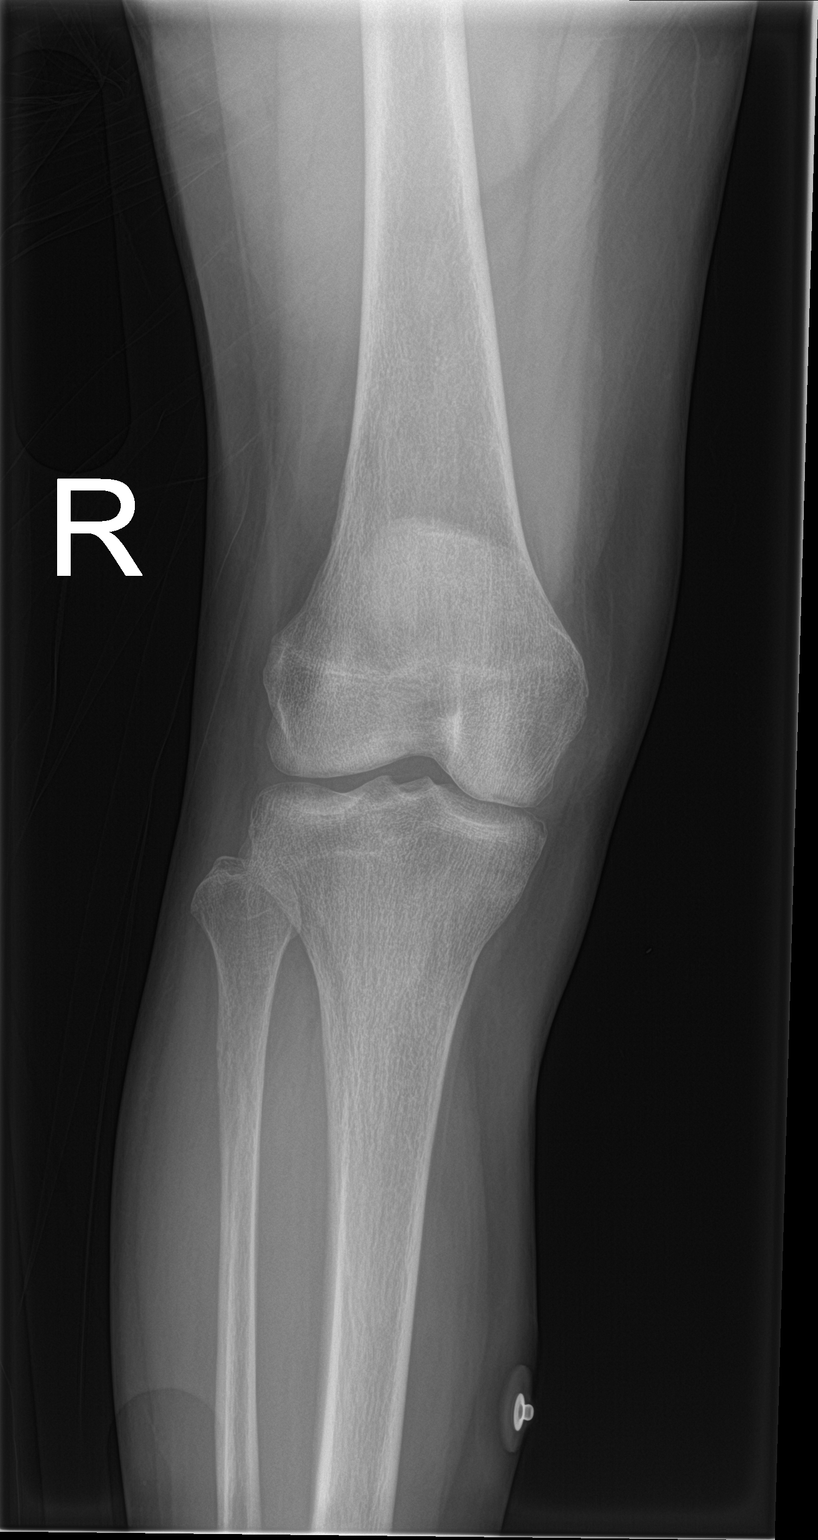

[knee lat]
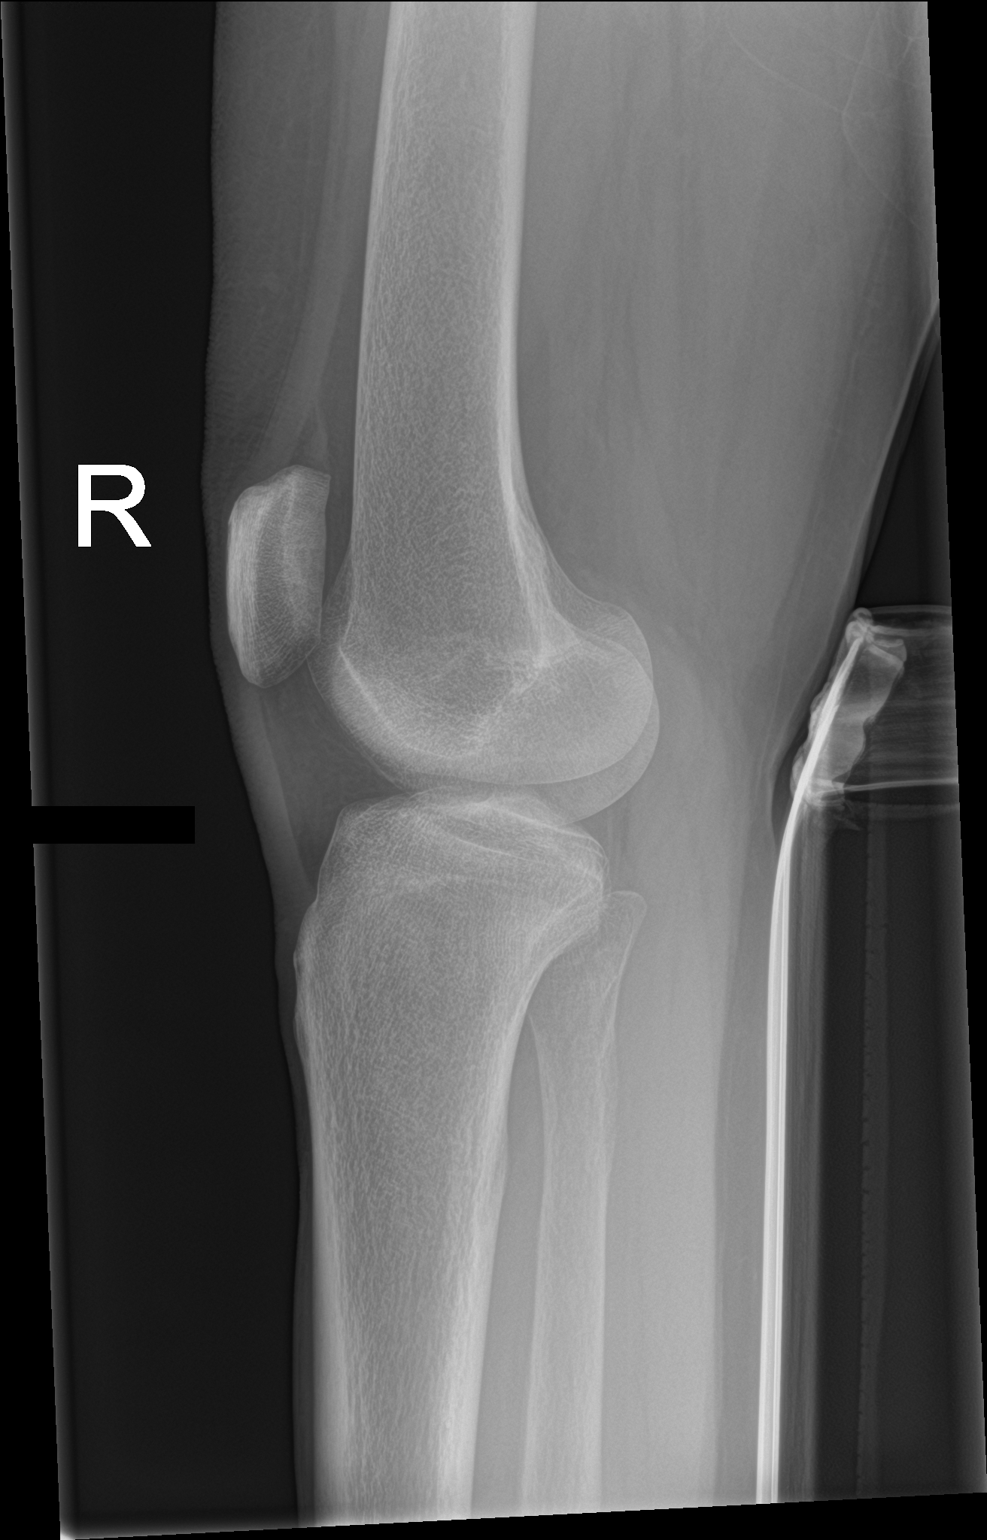

[knee obl (1 of 2)]
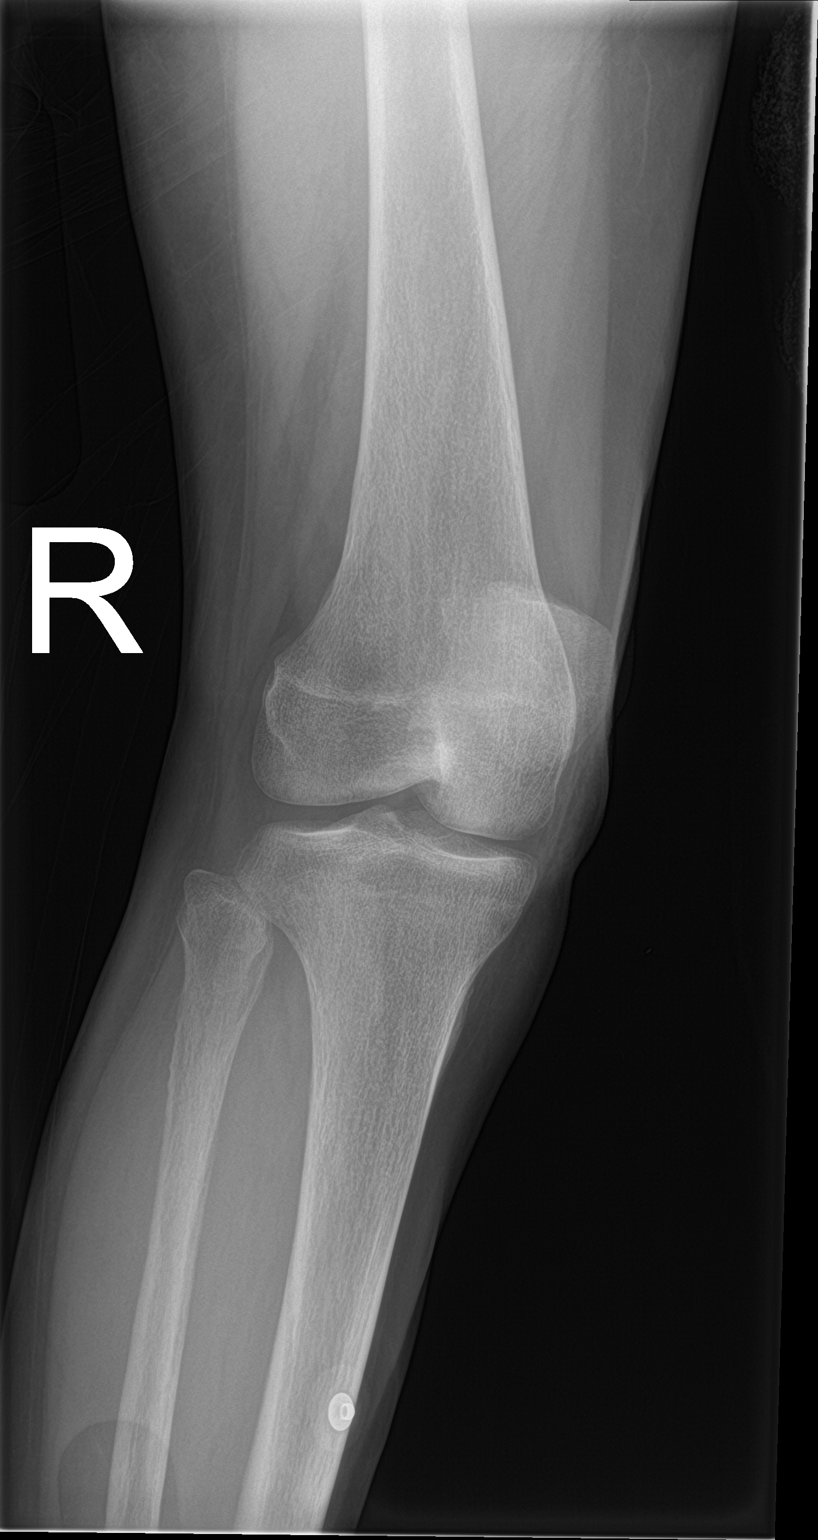

[knee obl (2 of 2)]
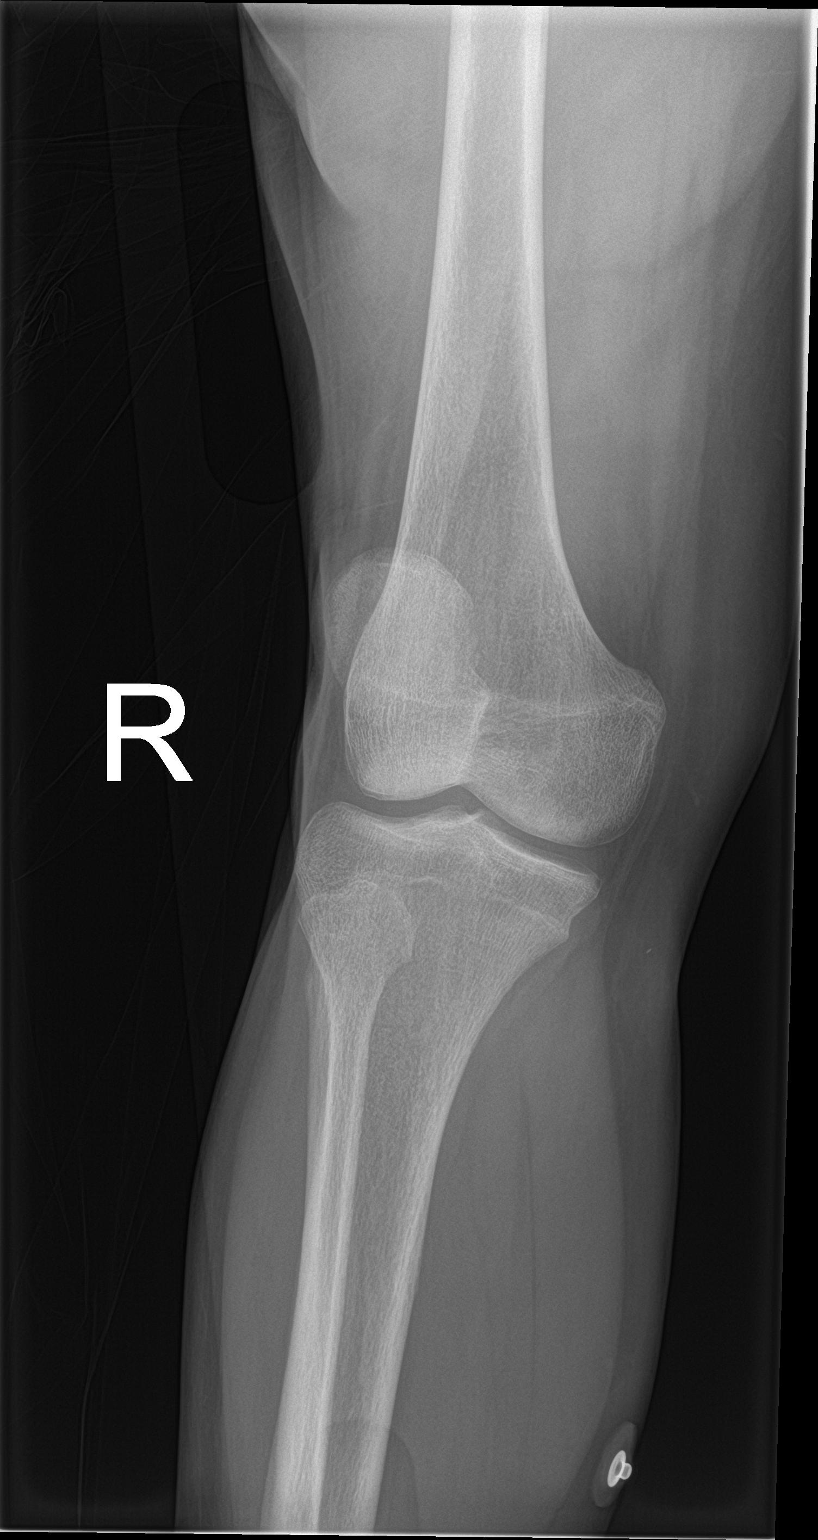

[4 of 4 positions shown; findings below may reference images not displayed]

FINDINGS: Frontal, lateral, and bilateral oblique views were obtained. There
is no appreciable fracture or dislocation. No joint effusion. Joint
spaces appear normal. No erosive change.
IMPRESSION: No fracture or dislocation. No joint effusion. No apparent
arthropathy.

## 2017-12-01 MED ORDER — ONDANSETRON 4 MG PO TBDP: ORAL_TABLET | ORAL | 0 refills | Status: DC

## 2017-12-01 MED ORDER — TETANUS-DIPHTH-ACELL PERTUSSIS 5-2.5-18.5 LF-MCG/0.5 IM SUSP
0.5000 mL | Freq: Once | INTRAMUSCULAR | Status: AC
  Administered 2017-12-01: 0.5 mL via INTRAMUSCULAR
  Filled 2017-12-01: qty 0.5

## 2017-12-01 MED ORDER — TRAMADOL HCL 50 MG PO TABS: 50.0000 mg | ORAL_TABLET | Freq: Four times a day (QID) | ORAL | 0 refills | Status: DC | PRN

## 2017-12-01 MED ORDER — ONDANSETRON HCL 4 MG/2ML IJ SOLN
4.0000 mg | Freq: Once | INTRAMUSCULAR | Status: AC
  Administered 2017-12-01: 4 mg via INTRAVENOUS
  Filled 2017-12-01: qty 2

## 2017-12-01 NOTE — Discharge Instructions (Addendum)
Follow-up with your family doctor next week for recheck of shoulder.  Use sling as much as possible

## 2017-12-01 NOTE — ED Notes (Signed)
Pt returned to room  

## 2017-12-01 NOTE — ED Notes (Signed)
Patient transported to X-ray 

## 2017-12-01 NOTE — ED Triage Notes (Signed)
Pt was giving the dogs a bath in the shower and she fell landing on the right side. C/o left ankile, knee, shoulder, and head pain. Denies LOC. Is not on blood thinners. A+O x4. Pt did get 8mg  zofran IV and 75 mcg Fentanyl during EMS ride

## 2017-12-01 NOTE — ED Provider Notes (Signed)
[facility]ERGENC[facility] DEPARTMENT Provider Note   CSN: [id number] Arrival date & time: 12/01/17  1620     History   Chief Complaint Chief Complaint  Patient presents with  . Fall    HPI Danielle Garrett is a 67 y.o. female.  Patient fell today.  Her dog tripped her.  She landed on her right shoulder.  She said that she did hit her head but did not lose consciousness and she has no neck or headache now. patient only complains of shoulder pain  The history is provided by the patient. No language interpreter was used.  Fall  This is a new problem. The current episode started 1 to 2 hours ago. The problem occurs constantly. The problem has not changed since onset.Pertinent negatives include no chest pain, no abdominal pain and no headaches. Exacerbated by: Movement of right shoulder. Nothing relieves the symptoms. She has tried nothing for the symptoms. The treatment provided no relief.    Past Medical History:  Diagnosis Date  . Allergic rhinitis due to other allergen   . Anginal pain ([place]) 2011   hospitalized for CP in the past; due to low potassium  . Anxiety   . Arthritis   . Childhood asthma   . Chronic hoarseness   . Gastroparesis   . GERD (gastroesophageal reflux disease)   . History of hiatal hernia   . Hx of blood transfusion reaction    in the early 80s  . Hypertension   . Left bundle branch block     (abnormal heart rhythm)  . PONV (postoperative nausea and vomiting)   . Pyloric stenosis     Patient Active Problem List   Diagnosis Date Noted  . Atrial fibrillation with tachycardic ventricular rate ([place]) 05/29/2016  . Syncope, cardiogenic 05/24/2016  . Primary osteoarthritis of right hip 06/05/2015  . Pre-operative clearance 06/02/2015  . Chest pain 06/02/2015  . LBBB (left bundle branch block) 06/02/2015  . History of left hip replacement 06/02/2015  . Palpitation 10/07/2014    Past Surgical History:  Procedure Laterality Date  .  ABDOMINAL HYSTERECTOMY  1983  . CARDIAC CATHETERIZATION  10/2009   Cardiac cath normal coronary arteries  . CERVICAL DISC ARTHROPLASTY     disc replacement in neck 1998  . COLONOSCOPY     10 year repeat 09/26/2010  . ESOPHAGOGASTRODUODENOSCOPY  09/26/2010,01/08/13  . LOOP RECORDER INSERTION N/A 05/29/2016   Procedure: Loop Recorder Insertion;  Surgeon: Evans Lance, MD;  Location: [facility];  Service: Cardiovascular;  Laterality: N/A;  . OTHER SURGICAL HISTORY  1983   hysterectomy ovaries intact, prolapsed   . OTHER SURGICAL HISTORY  2006   pyloric stricture surgery  . TOTAL HIP ARTHROPLASTY Right 06/07/2015   Procedure: TOTAL HIP ARTHROPLASTY ANTERIOR APPROACH;  Surgeon: Frederik Pear, MD;  Location: [facility];  Service: Orthopedics;  Laterality: Right;     OB History   None      Home Medications    Prior to Admission medications   Medication Sig Start Date End Date Taking? Authorizing Provider  albuterol (PROVENTIL HFA;VENTOLIN HFA) 108 (90 Base) MCG/ACT inhaler Inhale 1-2 puffs into the lungs every 4 (four) hours as needed for wheezing or shortness of breath.    [provider]  amLODipine (NORVASC) 2.5 MG tablet Take 2.5 mg by mouth daily.    [provider]  aspirin EC 81 MG tablet Take 81 mg by mouth daily.    [provider]  atorvastatin (  LIPITOR) 10 MG tablet Take 10 mg by mouth daily.    [provider]  calcium acetate (PHOSLO) 667 MG capsule Take by mouth 3 (three) times daily with meals.    [provider]  Calcium Carb-Cholecalciferol (CALCIUM 500 + D3 PO) Take 1 tablet by mouth daily.    [provider]  cholecalciferol (VITAMIN D) 1000 units tablet Take 1,000 Units by mouth daily.    [provider]  Coenzyme Q10 (COQ10) 100 MG CAPS Take 1 capsule by mouth daily.    [provider]  FLUoxetine (PROZAC) 10 MG capsule Take 10 mg by mouth daily.    [provider]  loratadine (CLARITIN)  10 MG tablet Take 10 mg by mouth daily.    [provider]  losartan (COZAAR) 50 MG tablet Take 50 mg by mouth 2 (two) times daily.     [provider]  ondansetron (ZOFRAN ODT) 4 MG disintegrating tablet 4mg  ODT q4 hours prn nausea/vomit 12/01/17   Milton Ferguson, MD  pantoprazole (PROTONIX) 40 MG tablet Take 40 mg by mouth 2 (two) times daily.    [provider]  sucralfate (CARAFATE) 1 g tablet Take 1 g by mouth 2 (two) times daily as needed (Takes if Pantoprazole is not working).     [provider]  traMADol (ULTRAM) 50 MG tablet Take 1 tablet (50 mg total) by mouth every 6 (six) hours as needed. 12/01/17   Milton Ferguson, MD  triamcinolone (NASACORT ALLERGY 24HR) 55 MCG/ACT AERO nasal inhaler Place 2 sprays into the nose daily as needed (for allergies).    [provider]    Family History Family History  Problem Relation Age of Onset  . Hypertension Mother   . Cirrhosis Father   . Hypertension Father   . OCD Sister   . Depression Sister   . Depression Sister   . Hypertension Sister     Social History Social History   Tobacco Use  . Smoking status: Never Smoker  . Smokeless tobacco: Never Used  Substance Use Topics  . Alcohol use: No    Alcohol/week: 0.0 standard drinks  . Drug use: No     Allergies   Hydrochlorothiazide; Adhesive [tape]; Codeine; Penicillins; Sulfa antibiotics; and Vicodin [hydrocodone-acetaminophen]   Review of Systems Review of Systems  Constitutional: Negative for appetite change and fatigue.  HENT: Negative for congestion, ear discharge and sinus pressure.   Eyes: Negative for discharge.  Respiratory: Negative for cough.   Cardiovascular: Negative for chest pain.  Gastrointestinal: Negative for abdominal pain and diarrhea.  Genitourinary: Negative for frequency and hematuria.  Musculoskeletal: Negative for back pain.       Pain in right shoulder  Skin: Negative for rash.  Neurological: Negative  for seizures and headaches.  Psychiatric/Behavioral: Negative for hallucinations.     Physical Exam Updated Vital Signs BP 137/80   Pulse 71   Temp (!) 97.4 F (36.3 C) (Oral)   Resp 12   Ht 5' (1.524 m)   Wt 52.6 kg   SpO2 100%   BMI 22.65 kg/m   Physical Exam  Constitutional: She is oriented to person, place, and time. She appears well-developed.  HENT:  Head: Normocephalic.  Eyes: Conjunctivae and EOM are normal. No scleral icterus.  Neck: Neck supple. No thyromegaly present.  Cardiovascular: Normal rate and regular rhythm. Exam reveals no gallop and no friction rub.  No murmur heard. Pulmonary/Chest: No stridor. She has no wheezes. She has no rales. She exhibits  no tenderness.  Abdominal: She exhibits no distension. There is no tenderness. There is no rebound.  Musculoskeletal: Normal range of motion. She exhibits no edema.  Tender right shoulder but full range of motion with pain.  Right knee has an abrasion and minimally tender  Lymphadenopathy:    She has no cervical adenopathy.  Neurological: She is oriented to person, place, and time. She exhibits normal muscle tone. Coordination normal.  Skin: No rash noted. No erythema.  Psychiatric: She has a normal mood and affect. Her behavior is normal.     ED Treatments / Results  Labs (all labs ordered are listed, but only abnormal results are displayed) Labs Reviewed - No data to display  EKG EKG Interpretation  Date/Time:  Saturday December 01 2017 16:31:13 EDT Ventricular Rate:  70 PR Interval:    QRS Duration: 135 QT Interval:  491 QTC Calculation: 530 R Axis:   -54 Text Interpretation:  Sinus rhythm Left atrial enlargement Left bundle branch block Confirmed by Milton Ferguson [id number]) on 12/01/2017 6:56:24 PM   Radiology Dg Shoulder Right  Result Date: 12/01/2017 CLINICAL DATA:  Pain following fall EXAM: RIGHT SHOULDER - 2+ VIEW COMPARISON:  None. FINDINGS: Frontal and Y scapular images were obtained. No  fracture or dislocation. Joint spaces appear unremarkable. No erosive change. Visualized right lung clear. IMPRESSION: No fracture or dislocation.  No evident arthropathy. Electronically Signed   By: Lowella Grip III M.D.   On: 12/01/2017 17:52   Dg Ankle Complete Right  Result Date: 12/01/2017 CLINICAL DATA:  Pain following fall EXAM: RIGHT ANKLE - COMPLETE 3+ VIEW COMPARISON:  None. FINDINGS: Frontal, oblique, and lateral views were obtained. There is no fracture or joint effusion. Joint spaces appear normal. No erosive change. Ankle mortise appears intact. IMPRESSION: No evident fracture or arthropathy.  Ankle mortise appears intact. Electronically Signed   By: Lowella Grip III M.D.   On: 12/01/2017 17:53   Dg Knee Complete 4 Views Right  Result Date: 12/01/2017 CLINICAL DATA:  Pain following fall EXAM: RIGHT KNEE - COMPLETE 4+ VIEW COMPARISON:  None. FINDINGS: Frontal, lateral, and bilateral oblique views were obtained. There is no appreciable fracture or dislocation. No joint effusion. Joint spaces appear normal. No erosive change. IMPRESSION: No fracture or dislocation. No joint effusion. No apparent arthropathy. Electronically Signed   By: Lowella Grip III M.D.   On: 12/01/2017 17:54    Procedures Procedures (including critical care time)  Medications Ordered in ED Medications  ondansetron (ZOFRAN) injection 4 mg (4 mg Intravenous Given 12/01/17 1655)  Tdap (BOOSTRIX) injection 0.5 mL (0.5 mLs Intramuscular Given 12/01/17 1656)     Initial Impression / Assessment and Plan / ED Course  I have reviewed the triage vital signs and the nursing notes.  Pertinent labs & imaging results that were available during my care of the patient were reviewed by me and considered in my medical decision making (see chart for details).     Patient right shoulder and abrasion to right knee.  Patient given swelling and pain medicine will follow-up with PCP  Final Clinical Impressions(s) /  ED Diagnoses   Final diagnoses:  Fall, initial encounter    ED Discharge Orders         Ordered    traMADol (ULTRAM) 50 MG tablet  Every 6 hours PRN     12/01/17 1857    ondansetron (ZOFRAN ODT) 4 MG disintegrating tablet     12/01/17 1857  Milton Ferguson, MD 12/01/17 Drema Halon

## 2017-12-01 NOTE — ED Notes (Signed)
Patient verbalizes understanding of discharge instructions. Opportunity for questioning and answers were provided. Armband removed by staff, pt discharged from ED in wheelchair.  

## 2017-12-03 DIAGNOSIS — E538 Deficiency of other specified B group vitamins: Secondary | ICD-10-CM | POA: Diagnosis not present

## 2017-12-03 DIAGNOSIS — M85859 Other specified disorders of bone density and structure, unspecified thigh: Secondary | ICD-10-CM | POA: Diagnosis not present

## 2017-12-03 DIAGNOSIS — S46001D Unspecified injury of muscle(s) and tendon(s) of the rotator cuff of right shoulder, subsequent encounter: Secondary | ICD-10-CM | POA: Diagnosis not present

## 2017-12-03 DIAGNOSIS — K2900 Acute gastritis without bleeding: Secondary | ICD-10-CM | POA: Diagnosis not present

## 2017-12-03 DIAGNOSIS — Z1159 Encounter for screening for other viral diseases: Secondary | ICD-10-CM | POA: Diagnosis not present

## 2017-12-03 DIAGNOSIS — J209 Acute bronchitis, unspecified: Secondary | ICD-10-CM | POA: Diagnosis not present

## 2017-12-05 DIAGNOSIS — M24811 Other specific joint derangements of right shoulder, not elsewhere classified: Secondary | ICD-10-CM | POA: Diagnosis not present

## 2017-12-07 ENCOUNTER — Ambulatory Visit (INDEPENDENT_AMBULATORY_CARE_PROVIDER_SITE_OTHER): Payer: Medicare Other | Admitting: *Deleted

## 2017-12-07 DIAGNOSIS — R55 Syncope and collapse: Secondary | ICD-10-CM

## 2017-12-10 NOTE — Progress Notes (Signed)
Carelink Summary Report / Loop Recorder 

## 2017-12-19 DIAGNOSIS — H524 Presbyopia: Secondary | ICD-10-CM | POA: Diagnosis not present

## 2017-12-19 DIAGNOSIS — H2513 Age-related nuclear cataract, bilateral: Secondary | ICD-10-CM | POA: Diagnosis not present

## 2017-12-19 DIAGNOSIS — H52222 Regular astigmatism, left eye: Secondary | ICD-10-CM | POA: Diagnosis not present

## 2017-12-19 DIAGNOSIS — H5213 Myopia, bilateral: Secondary | ICD-10-CM | POA: Diagnosis not present

## 2017-12-21 LAB — CUP PACEART REMOTE DEVICE CHECK
Date Time Interrogation Session: 20190906145840
Implantable Pulse Generator Implant Date: 20180212

## 2017-12-26 DIAGNOSIS — M24811 Other specific joint derangements of right shoulder, not elsewhere classified: Secondary | ICD-10-CM | POA: Diagnosis not present

## 2017-12-31 DIAGNOSIS — K5904 Chronic idiopathic constipation: Secondary | ICD-10-CM | POA: Diagnosis not present

## 2018-01-04 DIAGNOSIS — E538 Deficiency of other specified B group vitamins: Secondary | ICD-10-CM | POA: Diagnosis not present

## 2018-01-04 DIAGNOSIS — Z1211 Encounter for screening for malignant neoplasm of colon: Secondary | ICD-10-CM | POA: Diagnosis not present

## 2018-01-04 DIAGNOSIS — Z1239 Encounter for other screening for malignant neoplasm of breast: Secondary | ICD-10-CM | POA: Diagnosis not present

## 2018-01-04 DIAGNOSIS — Z23 Encounter for immunization: Secondary | ICD-10-CM | POA: Diagnosis not present

## 2018-01-04 DIAGNOSIS — F4321 Adjustment disorder with depressed mood: Secondary | ICD-10-CM | POA: Diagnosis not present

## 2018-01-04 DIAGNOSIS — S46011S Strain of muscle(s) and tendon(s) of the rotator cuff of right shoulder, sequela: Secondary | ICD-10-CM | POA: Diagnosis not present

## 2018-01-04 DIAGNOSIS — E785 Hyperlipidemia, unspecified: Secondary | ICD-10-CM | POA: Diagnosis not present

## 2018-01-04 DIAGNOSIS — N3941 Urge incontinence: Secondary | ICD-10-CM | POA: Diagnosis not present

## 2018-01-04 DIAGNOSIS — I10 Essential (primary) hypertension: Secondary | ICD-10-CM | POA: Diagnosis not present

## 2018-01-08 LAB — CUP PACEART REMOTE DEVICE CHECK
Date Time Interrogation Session: 20190924060558
Implantable Pulse Generator Implant Date: 20180212

## 2018-01-11 ENCOUNTER — Ambulatory Visit (INDEPENDENT_AMBULATORY_CARE_PROVIDER_SITE_OTHER): Payer: Medicare Other | Admitting: *Deleted

## 2018-01-11 DIAGNOSIS — R55 Syncope and collapse: Secondary | ICD-10-CM

## 2018-01-11 NOTE — Progress Notes (Signed)
Carelink Summary Report / Loop Recorder 

## 2018-01-14 LAB — CUP PACEART REMOTE DEVICE CHECK
Date Time Interrogation Session: 20190930085825
Implantable Pulse Generator Implant Date: 20180212

## 2018-01-23 DIAGNOSIS — M7541 Impingement syndrome of right shoulder: Secondary | ICD-10-CM | POA: Diagnosis not present

## 2018-02-04 DIAGNOSIS — M8588 Other specified disorders of bone density and structure, other site: Secondary | ICD-10-CM | POA: Diagnosis not present

## 2018-02-04 DIAGNOSIS — E538 Deficiency of other specified B group vitamins: Secondary | ICD-10-CM | POA: Diagnosis not present

## 2018-02-11 ENCOUNTER — Telehealth: Payer: Self-pay | Admitting: *Deleted

## 2018-02-11 NOTE — Telephone Encounter (Signed)
Spoke with patient regarding tachy episode-- ECGs appear SVT, duration 35 seconds, Avg V rate 167 bpm. Patient states she was driving home from church at this time and had no symptoms. Patient reports no syncope or presyncope. Advised patient we will place episode in Dr. Tanna Furry folder for review.

## 2018-02-13 ENCOUNTER — Ambulatory Visit (INDEPENDENT_AMBULATORY_CARE_PROVIDER_SITE_OTHER): Payer: Medicare Other | Admitting: *Deleted

## 2018-02-13 DIAGNOSIS — R55 Syncope and collapse: Secondary | ICD-10-CM

## 2018-02-13 DIAGNOSIS — I447 Left bundle-branch block, unspecified: Secondary | ICD-10-CM

## 2018-02-13 NOTE — Progress Notes (Signed)
Carelink Summary Report / Loop Recorder 

## 2018-03-04 DIAGNOSIS — J0100 Acute maxillary sinusitis, unspecified: Secondary | ICD-10-CM | POA: Diagnosis not present

## 2018-03-08 LAB — CUP PACEART REMOTE DEVICE CHECK
Date Time Interrogation Session: 20191122083415
Implantable Pulse Generator Implant Date: 20180212

## 2018-03-11 DIAGNOSIS — J45909 Unspecified asthma, uncomplicated: Secondary | ICD-10-CM | POA: Diagnosis not present

## 2018-03-11 DIAGNOSIS — E785 Hyperlipidemia, unspecified: Secondary | ICD-10-CM | POA: Diagnosis not present

## 2018-03-11 DIAGNOSIS — I10 Essential (primary) hypertension: Secondary | ICD-10-CM | POA: Diagnosis not present

## 2018-03-11 DIAGNOSIS — E538 Deficiency of other specified B group vitamins: Secondary | ICD-10-CM | POA: Diagnosis not present

## 2018-03-11 DIAGNOSIS — F4321 Adjustment disorder with depressed mood: Secondary | ICD-10-CM | POA: Diagnosis not present

## 2018-03-18 ENCOUNTER — Ambulatory Visit (INDEPENDENT_AMBULATORY_CARE_PROVIDER_SITE_OTHER): Payer: Medicare Other

## 2018-03-18 DIAGNOSIS — R55 Syncope and collapse: Secondary | ICD-10-CM | POA: Diagnosis not present

## 2018-03-18 NOTE — Progress Notes (Signed)
Carelink Summary Report / Loop Recorder 

## 2018-03-20 ENCOUNTER — Encounter (INDEPENDENT_AMBULATORY_CARE_PROVIDER_SITE_OTHER): Payer: Self-pay

## 2018-03-20 ENCOUNTER — Encounter: Payer: Self-pay | Admitting: Internal Medicine

## 2018-03-20 ENCOUNTER — Ambulatory Visit (INDEPENDENT_AMBULATORY_CARE_PROVIDER_SITE_OTHER): Payer: Medicare Other | Admitting: Internal Medicine

## 2018-03-20 VITALS — BP 124/62 | HR 77 | Ht 60.0 in | Wt 118.0 lb

## 2018-03-20 DIAGNOSIS — R55 Syncope and collapse: Secondary | ICD-10-CM

## 2018-03-20 DIAGNOSIS — Z4509 Encounter for adjustment and management of other cardiac device: Secondary | ICD-10-CM

## 2018-03-20 DIAGNOSIS — I447 Left bundle-branch block, unspecified: Secondary | ICD-10-CM

## 2018-03-20 LAB — CUP PACEART INCLINIC DEVICE CHECK
Date Time Interrogation Session: 20191204094402
Implantable Pulse Generator Implant Date: 20180212

## 2018-03-20 NOTE — Patient Instructions (Addendum)
Medication Instructions:  Your physician has recommended you make the following change in your medication:  1.  STOP taking Losartan   Labwork: None ordered.  Testing/Procedures: None ordered.  Follow-Up: Your physician wants you to follow-up in: 6 months with Dr. Lovena Le.   You will receive a reminder letter in the mail two months in advance. If you don't receive a letter, please call our office to schedule the follow-up appointment.   Any Other Special Instructions Will Be Listed Below (If Applicable).  If you need a refill on your cardiac medications before your next appointment, please call your pharmacy.

## 2018-03-20 NOTE — Progress Notes (Signed)
HPI Danielle Garrett returns today for ongoing followup of syncope and is s/p ILR insertion. The patient also has a h/o LBBB, and HTN. In the interim, she has done well but notes that she has spells where she feels like she wants to pass out though she has not. No syncope. No edema.  Allergies  Allergen Reactions  . Hydrochlorothiazide Nausea Only  . Penicillins Other (See Comments)    Unknown Other reaction(s): Other (See Comments) Pt was a child  . Adhesive [Tape]     Pt states ' it took my skin off' Steri-Strips  . Codeine Nausea And Vomiting  . Sulfa Antibiotics Nausea And Vomiting  . Vicodin [Hydrocodone-Acetaminophen] Nausea And Vomiting and Other (See Comments)    Did not feel like herself     Current Outpatient Medications  Medication Sig Dispense Refill  . albuterol (PROVENTIL HFA;VENTOLIN HFA) 108 (90 Base) MCG/ACT inhaler Inhale 1-2 puffs into the lungs every 4 (four) hours as needed for wheezing or shortness of breath.    Marland Kitchen aspirin EC 81 MG tablet Take 81 mg by mouth daily.    Marland Kitchen FLUoxetine (PROZAC) 10 MG capsule Take 10 mg by mouth daily.    Marland Kitchen loratadine (CLARITIN) 10 MG tablet Take 10 mg by mouth daily.    . pantoprazole (PROTONIX) 40 MG tablet Take 40 mg by mouth 2 (two) times daily.    . rosuvastatin (CRESTOR) 10 MG tablet Take 10 mg by mouth daily.    . sucralfate (CARAFATE) 1 g tablet Take 1 g by mouth 2 (two) times daily as needed (Takes if Pantoprazole is not working).     . triamcinolone (NASACORT ALLERGY 24HR) 55 MCG/ACT AERO nasal inhaler Place 2 sprays into the nose daily as needed (for allergies).     No current facility-administered medications for this visit.      Past Medical History:  Diagnosis Date  . Allergic rhinitis due to other allergen   . Anginal pain ([place]) 2011   hospitalized for CP in the past; due to low potassium  . Anxiety   . Arthritis   . Childhood asthma   . Chronic hoarseness   . Gastroparesis   . GERD (gastroesophageal  reflux disease)   . History of hiatal hernia   . Hx of blood transfusion reaction    in the early 80s  . Hypertension   . Left bundle branch block     (abnormal heart rhythm)  . PONV (postoperative nausea and vomiting)   . Pyloric stenosis     ROS:   All systems reviewed and negative except as noted in the HPI.   Past Surgical History:  Procedure Laterality Date  . ABDOMINAL HYSTERECTOMY  1983  . CARDIAC CATHETERIZATION  10/2009   Cardiac cath normal coronary arteries  . CERVICAL DISC ARTHROPLASTY     disc replacement in neck 1998  . COLONOSCOPY     10 year repeat 09/26/2010  . ESOPHAGOGASTRODUODENOSCOPY  09/26/2010,01/08/13  . LOOP RECORDER INSERTION N/A 05/29/2016   Procedure: Loop Recorder Insertion;  Surgeon: Evans Lance, MD;  Location: [facility];  Service: Cardiovascular;  Laterality: N/A;  . OTHER SURGICAL HISTORY  1983   hysterectomy ovaries intact, prolapsed   . OTHER SURGICAL HISTORY  2006   pyloric stricture surgery  . TOTAL HIP ARTHROPLASTY Right 06/07/2015   Procedure: TOTAL HIP ARTHROPLASTY ANTERIOR APPROACH;  Surgeon: Frederik Pear, MD;  Location: [facility];  Service: Orthopedics;  Laterality: Right;  Family History  Problem Relation Age of Onset  . Hypertension Mother   . Cirrhosis Father   . Hypertension Father   . OCD Sister   . Depression Sister   . Depression Sister   . Hypertension Sister      Social History   Socioeconomic History  . Marital status: Married    Spouse name: Not on file  . Number of children: Not on file  . Years of education: Not on file  . Highest education level: Not on file  Occupational History  . Not on file  Social Needs  . Financial resource strain: Not on file  . Food insecurity:    Worry: Not on file    Inability: Not on file  . Transportation needs:    Medical: Not on file    Non-medical: Not on file  Tobacco Use  . Smoking status: Never Smoker  . Smokeless tobacco: Never Used  Substance and Sexual  Activity  . Alcohol use: No    Alcohol/week: 0.0 standard drinks  . Drug use: No  . Sexual activity: Not on file  Lifestyle  . Physical activity:    Days per week: Not on file    Minutes per session: Not on file  . Stress: Not on file  Relationships  . Social connections:    Talks on phone: Not on file    Gets together: Not on file    Attends religious service: Not on file    Active member of club or organization: Not on file    Attends meetings of clubs or organizations: Not on file    Relationship status: Not on file  . Intimate partner violence:    Fear of current or ex partner: Not on file    Emotionally abused: Not on file    Physically abused: Not on file    Forced sexual activity: Not on file  Other Topics Concern  . Not on file  Social History Narrative  . Not on file     BP 124/62   Pulse 77   Ht 5' (1.524 m)   Wt 118 lb (53.5 kg)   SpO2 99%   BMI 23.05 kg/m   Physical Exam:  Well appearing NAD HEENT: Unremarkable Neck:  No JVD, no thyromegally Lymphatics:  No adenopathy Back:  No CVA tenderness Lungs:  Clear with no wheezes HEART:  Regular rate rhythm, no murmurs, no rubs, no clicks Abd:  soft, positive bowel sounds, no organomegally, no rebound, no guarding Ext:  2 plus pulses, no edema, no cyanosis, no clubbing Skin:  No rashes no nodules Neuro:  CN II through XII intact, motor grossly intact  DEVICE  Normal device function.  See PaceArt for details.   Assess/Plan: 1. Syncope - etiology is still unclear although I suspect she is having vasodepression. She has not had any significant brady or tachy episodes. 2. LBBB - she has not had any pauses. We will follow. 3. HTN - I am concerned that her blood pressures are dropping and I have asked her to stop her losartan. If she has uncontrolled HTN, then we might consider a different BP med.  Mikle Bosworth.D.

## 2018-03-27 ENCOUNTER — Telehealth: Payer: Self-pay | Admitting: *Deleted

## 2018-03-27 NOTE — Telephone Encounter (Signed)
Spoke with patient to discuss tachy episode from 03/26/18 at [facility]. Patient reports she was sitting doing her morning devotions and felt an elevated HR. She felt weak, but denies dizziness, syncope, or other symptoms.  Requested manual transmission for review of additional tachy episode that has not transmitted. Gave instructions. Patient will send when she gets home from work. Advised I will review ECGs with Dr. Lovena Le when transmission is received. She verbalizes understanding and agreement with plan.

## 2018-03-27 NOTE — Telephone Encounter (Signed)
Dr. Lovena Le reviewed available ECG from 03/26/18, recommended no changes at this time. Will await manual transmission for review of additional episode.

## 2018-03-28 NOTE — Telephone Encounter (Signed)
Manual transmission received and reviewed. Tachy episode printed and placed in Dr. Macon Large folder for review.

## 2018-04-08 DIAGNOSIS — R05 Cough: Secondary | ICD-10-CM | POA: Diagnosis not present

## 2018-04-08 DIAGNOSIS — J069 Acute upper respiratory infection, unspecified: Secondary | ICD-10-CM | POA: Diagnosis not present

## 2018-04-08 DIAGNOSIS — J040 Acute laryngitis: Secondary | ICD-10-CM | POA: Diagnosis not present

## 2018-04-18 ENCOUNTER — Other Ambulatory Visit: Payer: Self-pay | Admitting: Internal Medicine

## 2018-04-22 ENCOUNTER — Ambulatory Visit (INDEPENDENT_AMBULATORY_CARE_PROVIDER_SITE_OTHER): Payer: Medicare Other

## 2018-04-22 DIAGNOSIS — R55 Syncope and collapse: Secondary | ICD-10-CM

## 2018-04-22 LAB — CUP PACEART REMOTE DEVICE CHECK
Date Time Interrogation Session: 20200106171312
Implantable Pulse Generator Implant Date: 20180212

## 2018-04-23 NOTE — Progress Notes (Signed)
Carelink Summary Report / Loop Recorder 

## 2018-05-14 ENCOUNTER — Other Ambulatory Visit: Payer: Self-pay | Admitting: Internal Medicine

## 2018-05-14 DIAGNOSIS — F4321 Adjustment disorder with depressed mood: Secondary | ICD-10-CM | POA: Diagnosis not present

## 2018-05-14 DIAGNOSIS — Z1231 Encounter for screening mammogram for malignant neoplasm of breast: Secondary | ICD-10-CM

## 2018-05-14 DIAGNOSIS — R42 Dizziness and giddiness: Secondary | ICD-10-CM | POA: Diagnosis not present

## 2018-05-14 DIAGNOSIS — Z23 Encounter for immunization: Secondary | ICD-10-CM | POA: Diagnosis not present

## 2018-05-14 DIAGNOSIS — Z0000 Encounter for general adult medical examination without abnormal findings: Secondary | ICD-10-CM | POA: Diagnosis not present

## 2018-05-14 DIAGNOSIS — I10 Essential (primary) hypertension: Secondary | ICD-10-CM | POA: Diagnosis not present

## 2018-05-14 DIAGNOSIS — R278 Other lack of coordination: Secondary | ICD-10-CM | POA: Diagnosis not present

## 2018-05-14 DIAGNOSIS — Z1389 Encounter for screening for other disorder: Secondary | ICD-10-CM | POA: Diagnosis not present

## 2018-05-14 DIAGNOSIS — E538 Deficiency of other specified B group vitamins: Secondary | ICD-10-CM | POA: Diagnosis not present

## 2018-05-14 DIAGNOSIS — R269 Unspecified abnormalities of gait and mobility: Secondary | ICD-10-CM | POA: Diagnosis not present

## 2018-05-14 DIAGNOSIS — Z1239 Encounter for other screening for malignant neoplasm of breast: Secondary | ICD-10-CM | POA: Diagnosis not present

## 2018-05-14 DIAGNOSIS — K219 Gastro-esophageal reflux disease without esophagitis: Secondary | ICD-10-CM | POA: Diagnosis not present

## 2018-05-14 DIAGNOSIS — E785 Hyperlipidemia, unspecified: Secondary | ICD-10-CM | POA: Diagnosis not present

## 2018-05-14 DIAGNOSIS — F419 Anxiety disorder, unspecified: Secondary | ICD-10-CM | POA: Diagnosis not present

## 2018-05-15 ENCOUNTER — Ambulatory Visit
Admission: RE | Admit: 2018-05-15 | Discharge: 2018-05-15 | Disposition: A | Discharge status: Home / Self Care | Payer: Medicare Other | Source: Ambulatory Visit | Attending: Internal Medicine | Admitting: Internal Medicine

## 2018-05-15 DIAGNOSIS — Z1231 Encounter for screening mammogram for malignant neoplasm of breast: Secondary | ICD-10-CM

## 2018-05-15 IMAGING — MG DIGITAL SCREENING BILATERAL MAMMOGRAM WITH TOMO AND CAD
8 series · 9 of 24 positions shown · non-contrast
Comparison: None.

CLINICAL DATA: Screening.

EXAM:
DIGITAL SCREENING BILATERAL MAMMOGRAM WITH TOMO AND CAD

[R MLO synth-2D]
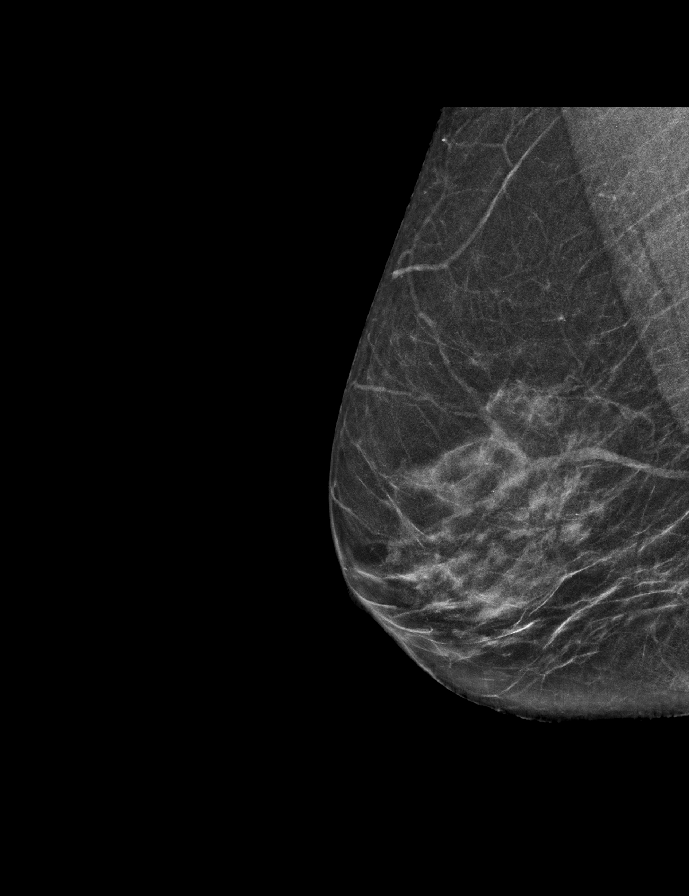

[R CC synth-2D]
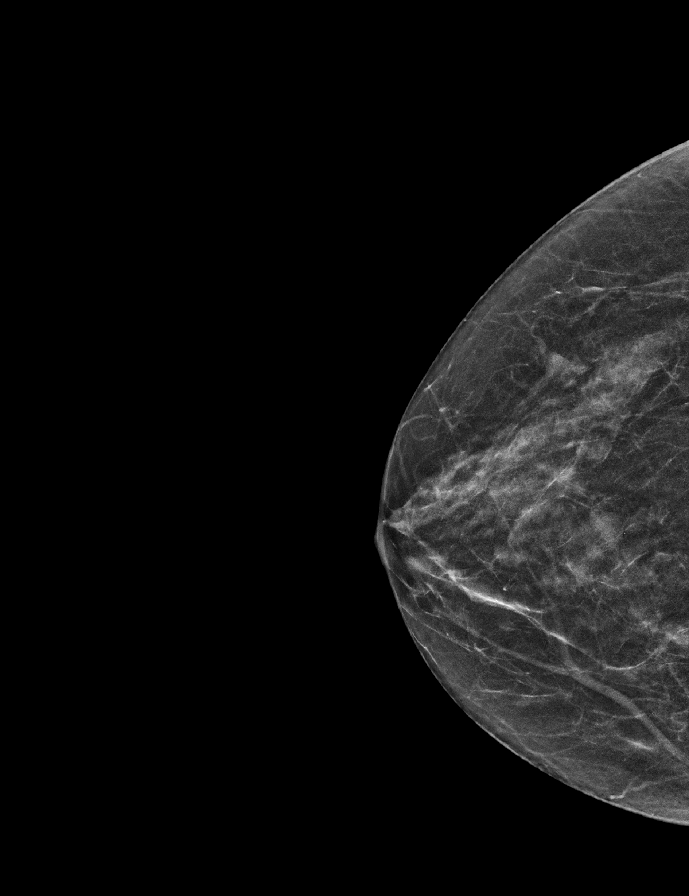

[L CC synth-2D]
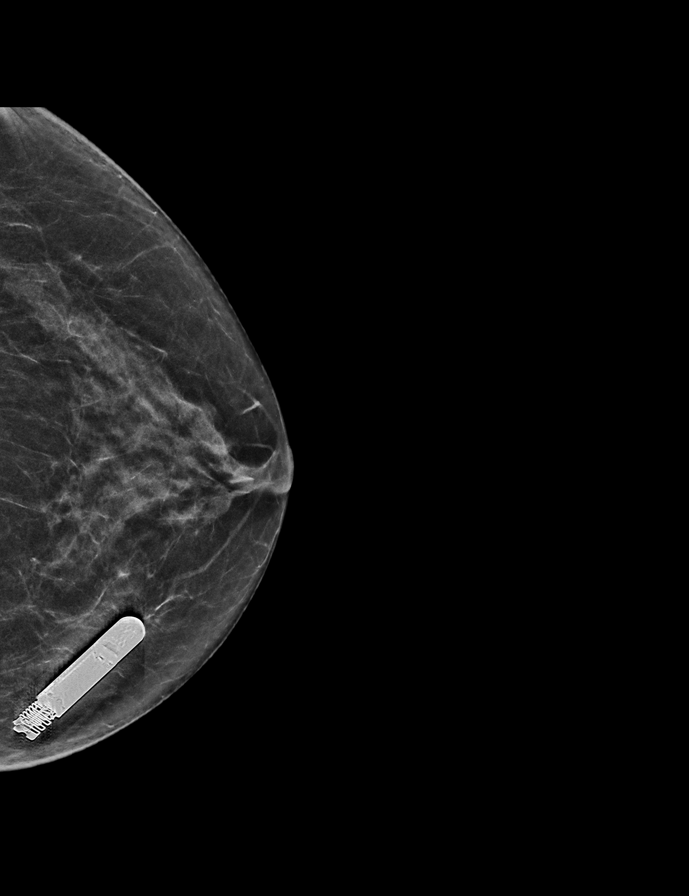

[L MLO synth-2D]
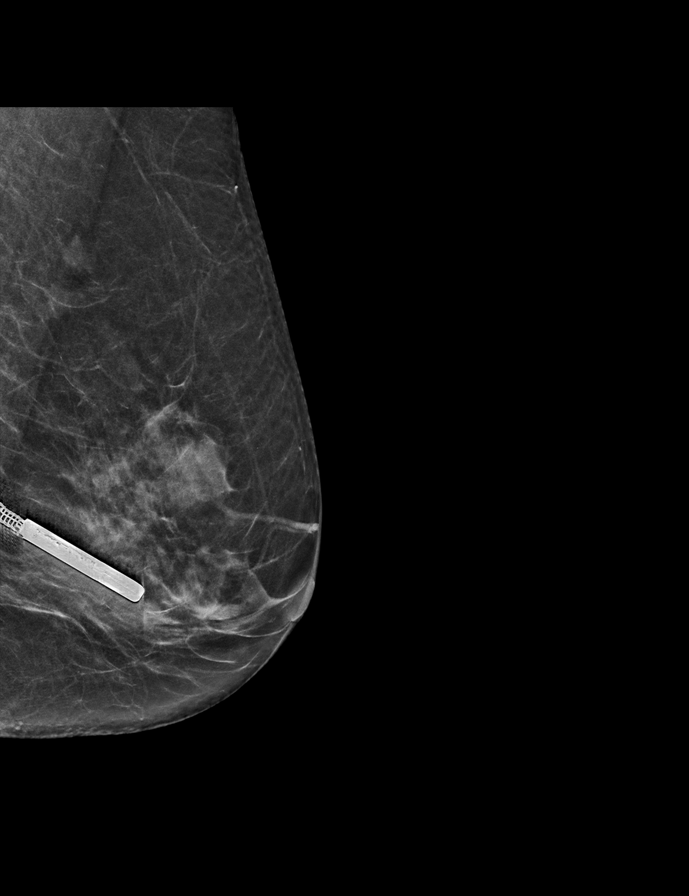

[L MLO tomo · 2 of 54 frames shown]
[frame 18/54]
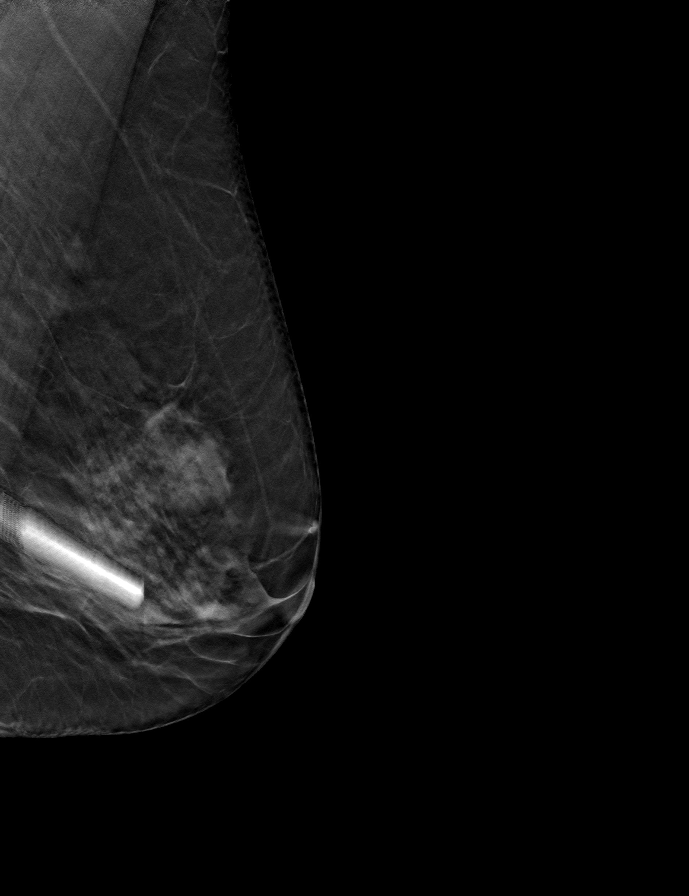
[frame 27/54]
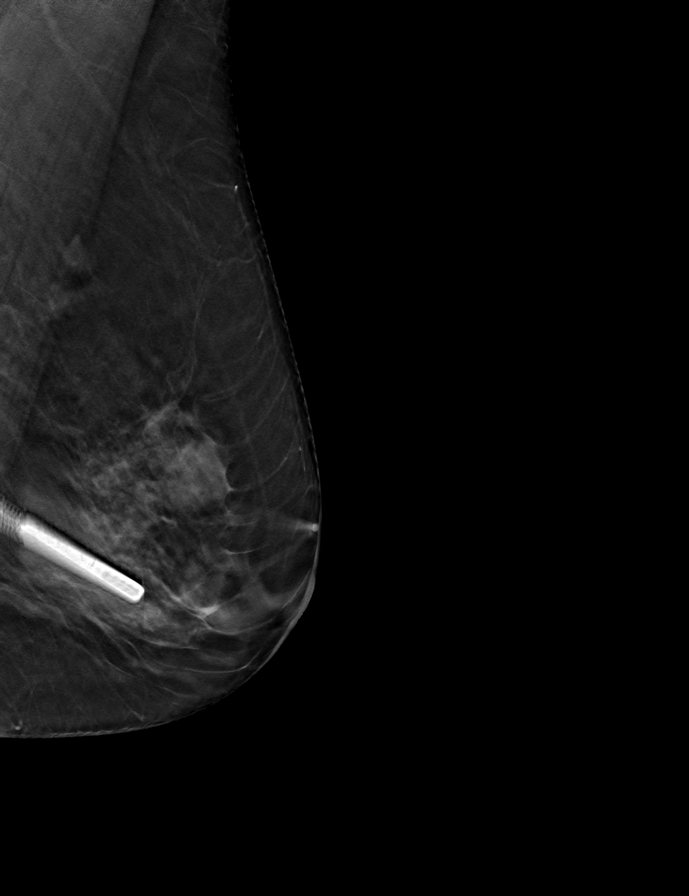

[L CC tomo · tomo slice 26/51.0]
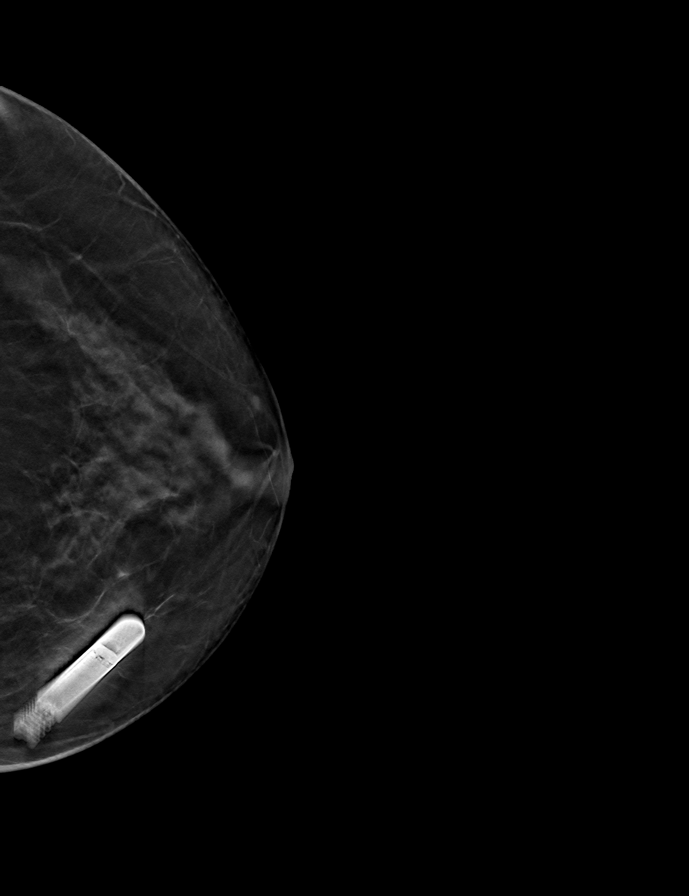

[R MLO tomo · tomo slice 26/51.0]
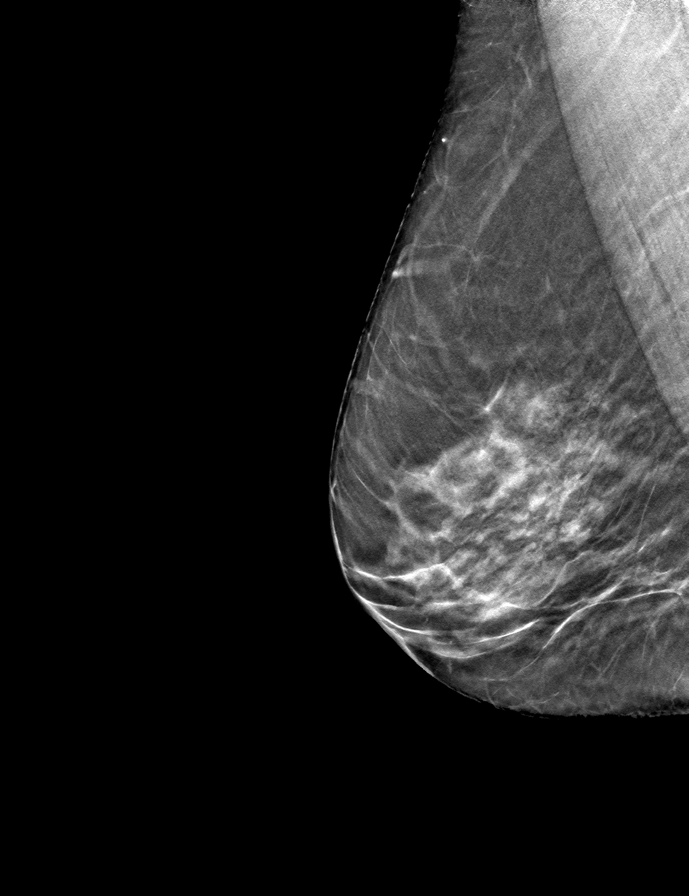

[R CC tomo · tomo slice 23/44.0]
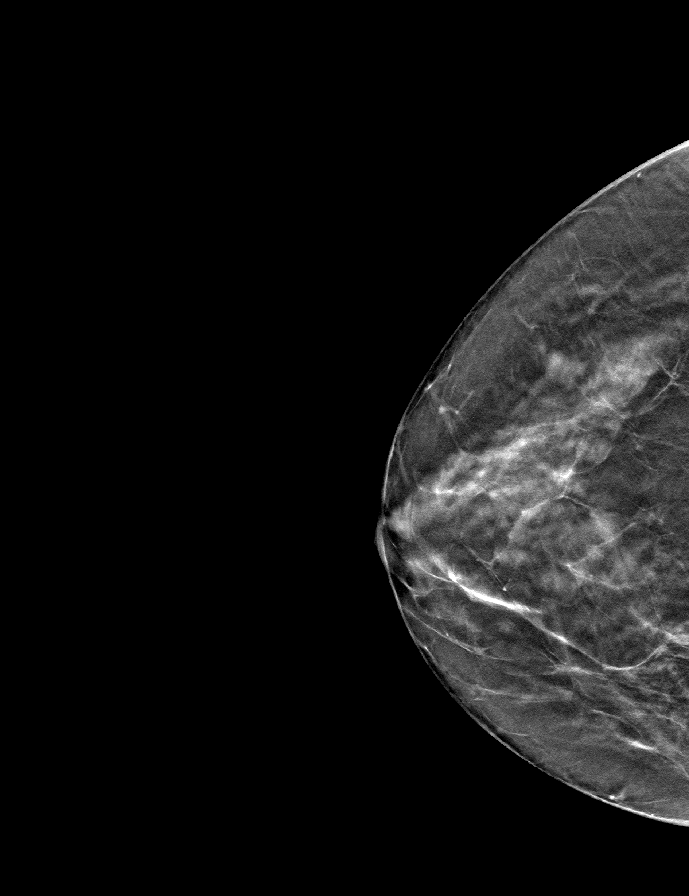

[9 of 24 positions shown; findings below may reference images not displayed]

ACR Breast Density Category b: There are scattered areas of
fibroglandular density.
FINDINGS: In the right breast a possible mass requires further evaluation.

In the left breast a possible asymmetry requires further evaluation.

Images were processed with CAD.
IMPRESSION: Further evaluation is suggested for possible mass in the right
breast.

Further evaluation is suggested for possible asymmetry in the left
breast.

RECOMMENDATION:
Diagnostic mammogram and possibly ultrasound of both breasts.
(Code:[QW])

The patient will be contacted regarding the findings, and additional
imaging will be scheduled.

BI-RADS CATEGORY  0: Incomplete. Need additional imaging evaluation
and/or prior mammograms for comparison.

## 2018-05-16 ENCOUNTER — Other Ambulatory Visit: Payer: Self-pay | Admitting: Internal Medicine

## 2018-05-16 DIAGNOSIS — R928 Other abnormal and inconclusive findings on diagnostic imaging of breast: Secondary | ICD-10-CM

## 2018-05-21 ENCOUNTER — Other Ambulatory Visit: Payer: Self-pay | Admitting: Internal Medicine

## 2018-05-23 ENCOUNTER — Ambulatory Visit (INDEPENDENT_AMBULATORY_CARE_PROVIDER_SITE_OTHER): Payer: Medicare Other

## 2018-05-23 DIAGNOSIS — R55 Syncope and collapse: Secondary | ICD-10-CM | POA: Diagnosis not present

## 2018-05-30 ENCOUNTER — Other Ambulatory Visit: Payer: Self-pay | Admitting: Internal Medicine

## 2018-06-03 NOTE — Progress Notes (Signed)
Carelink Summary Report / Loop Recorder 

## 2018-06-04 ENCOUNTER — Ambulatory Visit
Admission: RE | Admit: 2018-06-04 | Discharge: 2018-06-04 | Disposition: A | Discharge status: Home / Self Care | Payer: Medicare Other | Source: Ambulatory Visit | Attending: Internal Medicine | Admitting: Internal Medicine

## 2018-06-04 ENCOUNTER — Other Ambulatory Visit: Payer: Self-pay | Admitting: Obstetrics & Gynecology

## 2018-06-04 ENCOUNTER — Other Ambulatory Visit: Payer: Self-pay | Admitting: Internal Medicine

## 2018-06-04 DIAGNOSIS — R928 Other abnormal and inconclusive findings on diagnostic imaging of breast: Secondary | ICD-10-CM

## 2018-06-04 DIAGNOSIS — N6489 Other specified disorders of breast: Secondary | ICD-10-CM

## 2018-06-04 IMAGING — US ULTRASOUND LEFT BREAST LIMITED
1 series · 4 of 4 positions shown · non-contrast
Comparison: Previous exam(s).

CLINICAL DATA: Callback from screening mammogram for possible mass
right breast and possible asymmetry left breast.

EXAM:
DIGITAL DIAGNOSTIC BILATERAL MAMMOGRAM WITH CAD AND TOMO
ULTRASOUND BILATERAL BREAST

[Series 1: ultrasound left breast limited · 0.06mm/px · 4 of 4 slices shown]
[im 1/4]
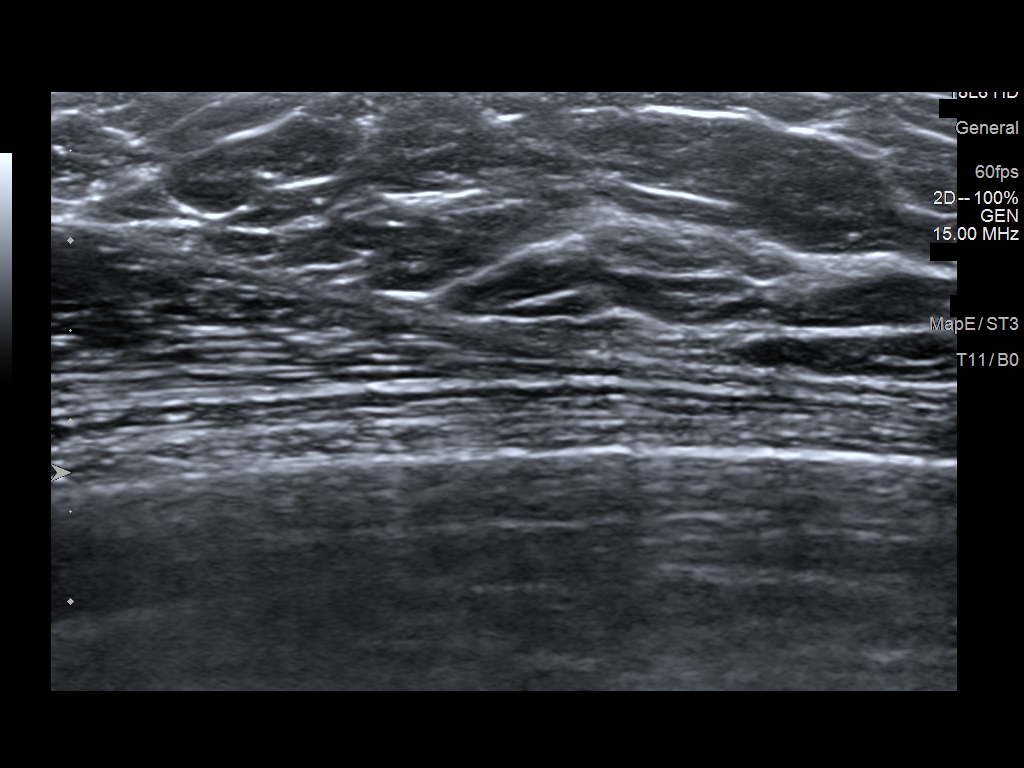
[im 2/4]
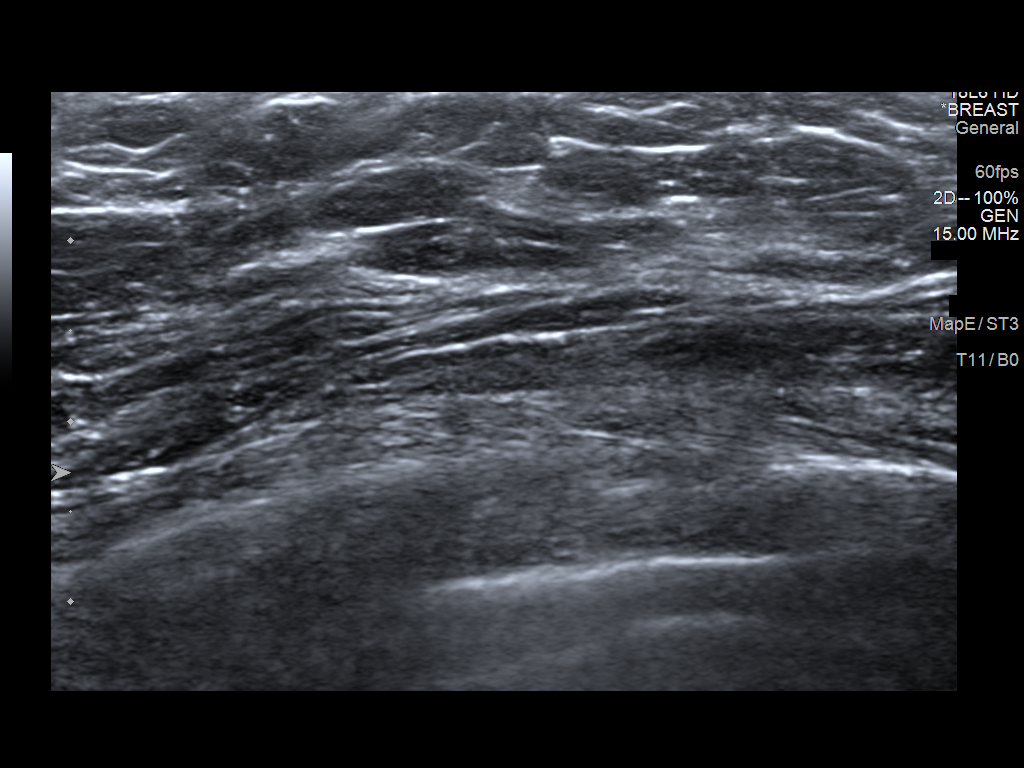
[im 3/4]
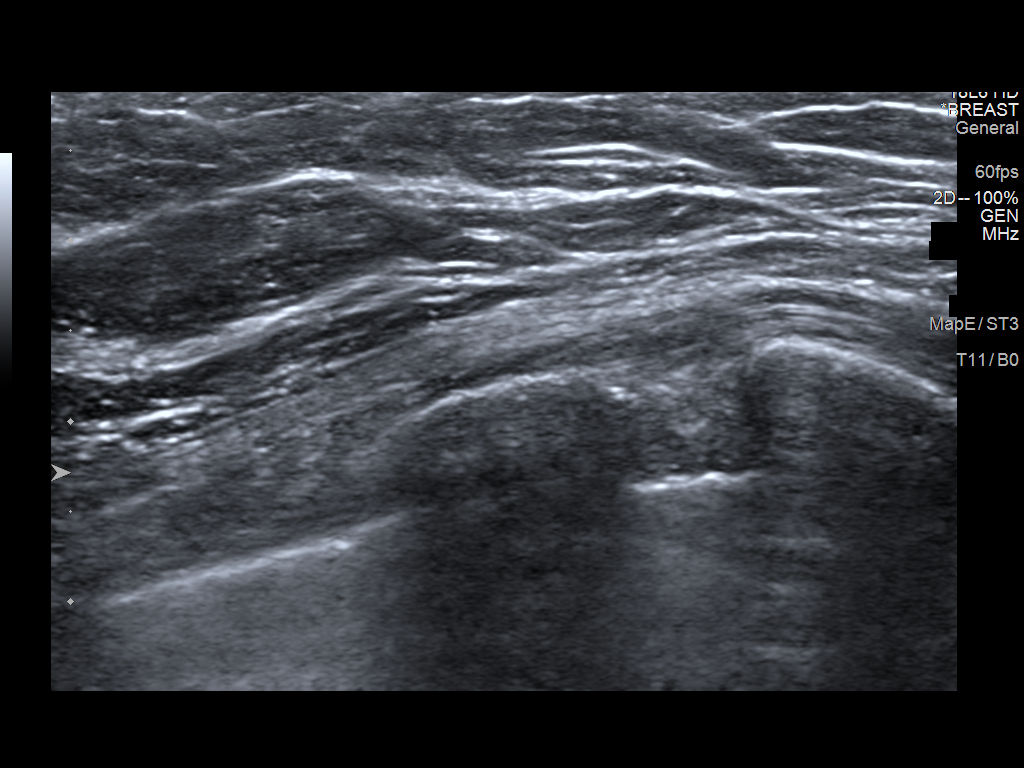
[im 4/4]
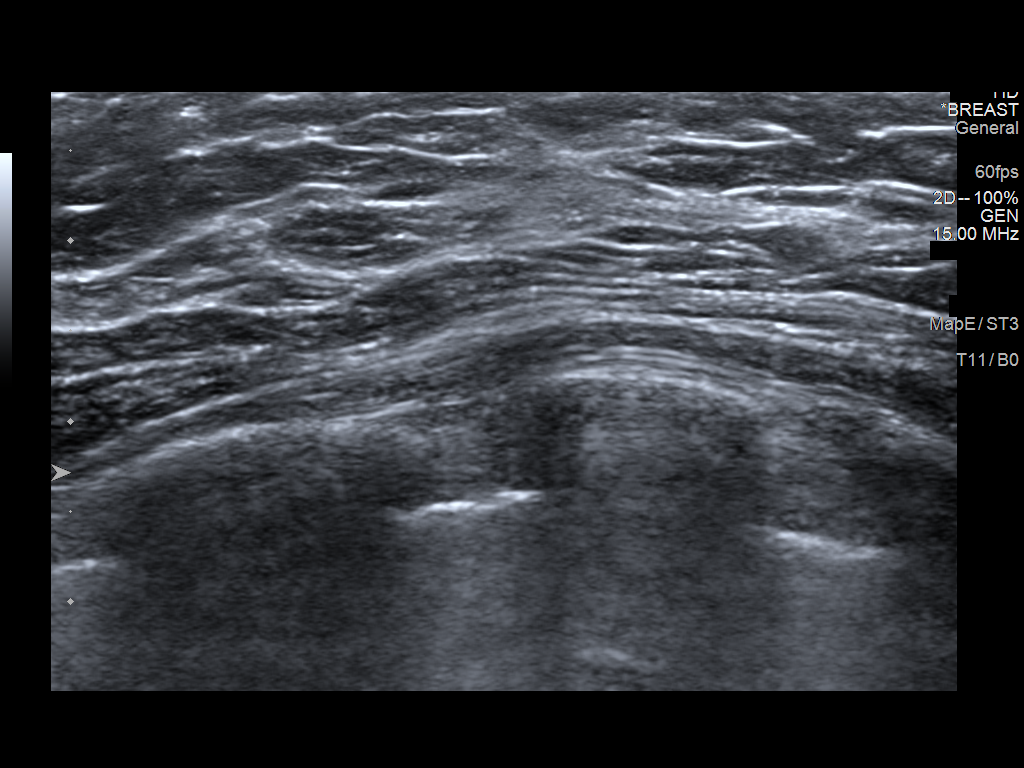

[4 of 4 positions shown; findings below may reference images not displayed]

ACR Breast Density Category b: There are scattered areas of
fibroglandular density.
FINDINGS: Lateral view of left breast, spot compression CC view of right
breast, spot compression MLO views of bilateral breasts are
submitted. Previously questioned mass in the right breast 9 o'clock
persists on additional views. The previously questioned asymmetry in
the posterior upper left breast persists and appears to have focal
notched fat.

Mammographic images were processed with CAD.

Targeted ultrasound is performed, showing normal 5 mm intramammary
lymph node at the right breast 9:30 o'clock 4 cm from nipple
correlating to the mammographic finding. Ultrasound of the upper
left breast demonstrate no focal abnormal discrete cystic or solid
lesion of correlate to the mammographic asymmetry.
IMPRESSION: Probable benign findings.

RECOMMENDATION:
Six-month follow-up mammogram left breast.

I have discussed the findings and recommendations with the patient.
Results were also provided in writing at the conclusion of the
visit. If applicable, a reminder letter will be sent to the patient
regarding the next appointment.

BI-RADS CATEGORY  3: Probably benign.

## 2018-06-04 IMAGING — MG DIGITAL DIAGNOSTIC BILATERAL MAMMOGRAM WITH TOMO AND CAD
8 series · 8 of 24 positions shown · non-contrast
Comparison: Previous exam(s).

CLINICAL DATA: Callback from screening mammogram for possible mass
right breast and possible asymmetry left breast.

EXAM:
DIGITAL DIAGNOSTIC BILATERAL MAMMOGRAM WITH CAD AND TOMO
ULTRASOUND BILATERAL BREAST

[R CC synth-2D]
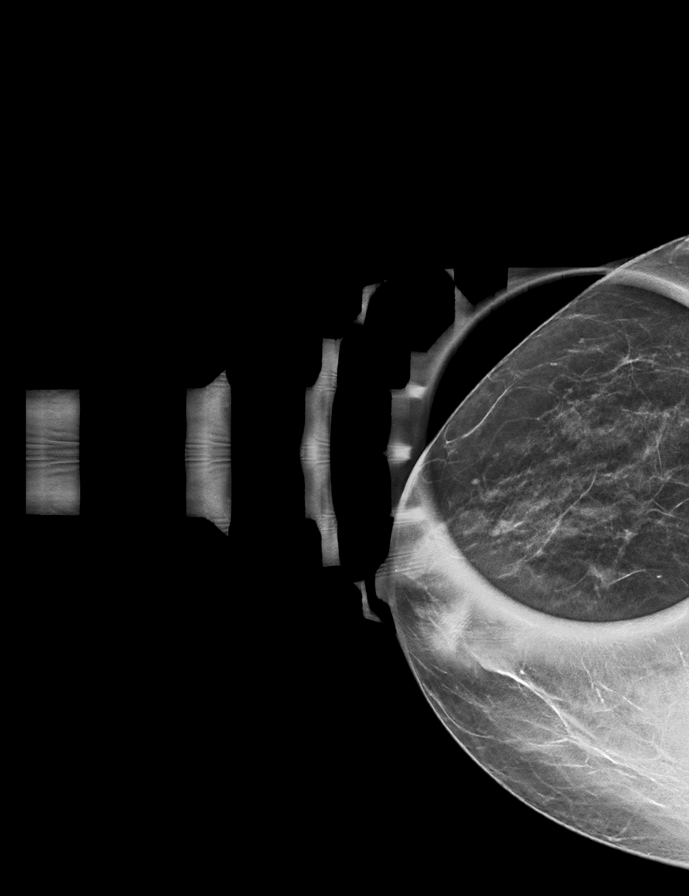

[R MLO synth-2D]
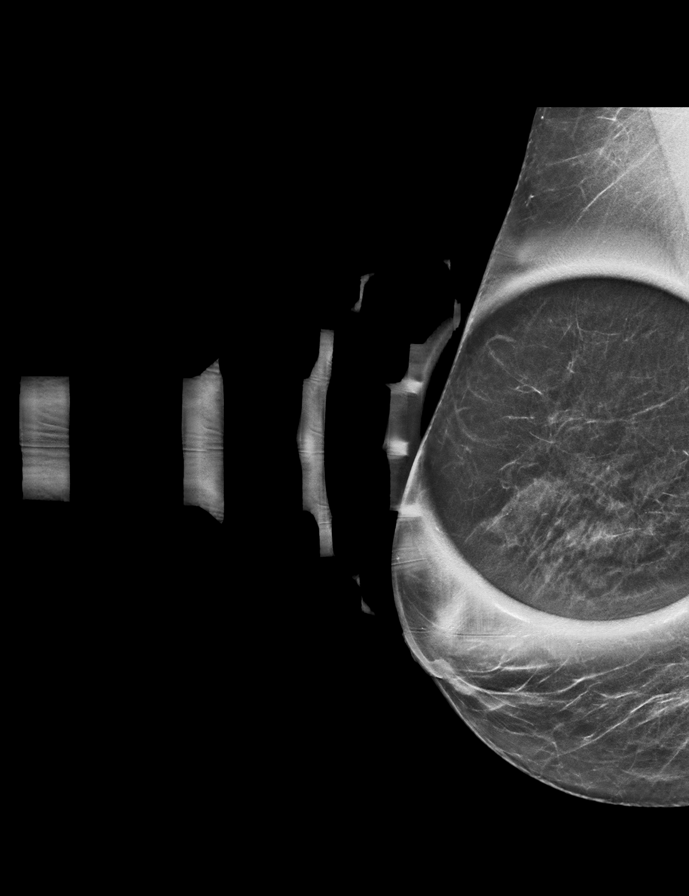

[L MLO synth-2D]
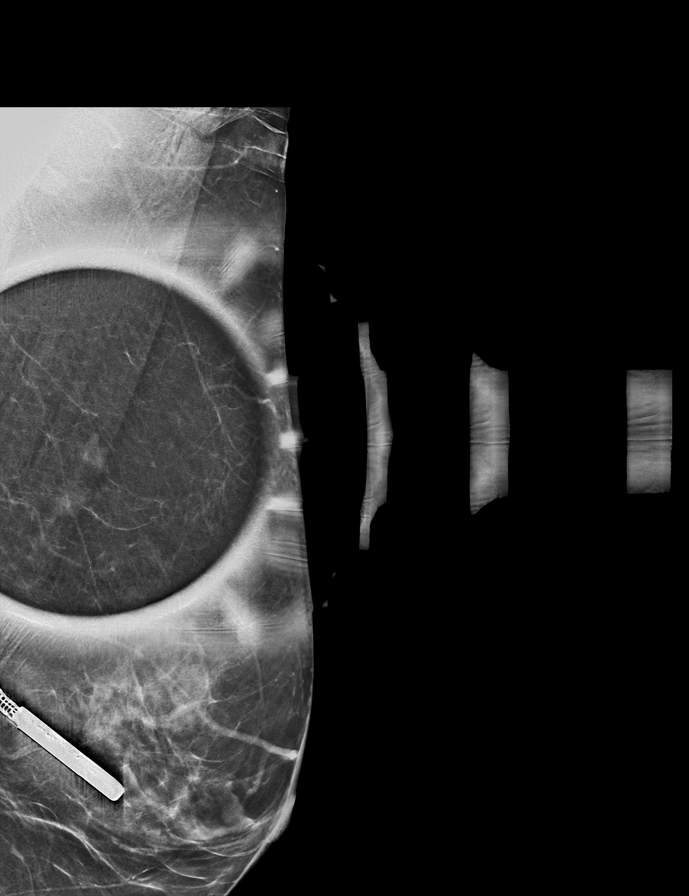

[L ML synth-2D]
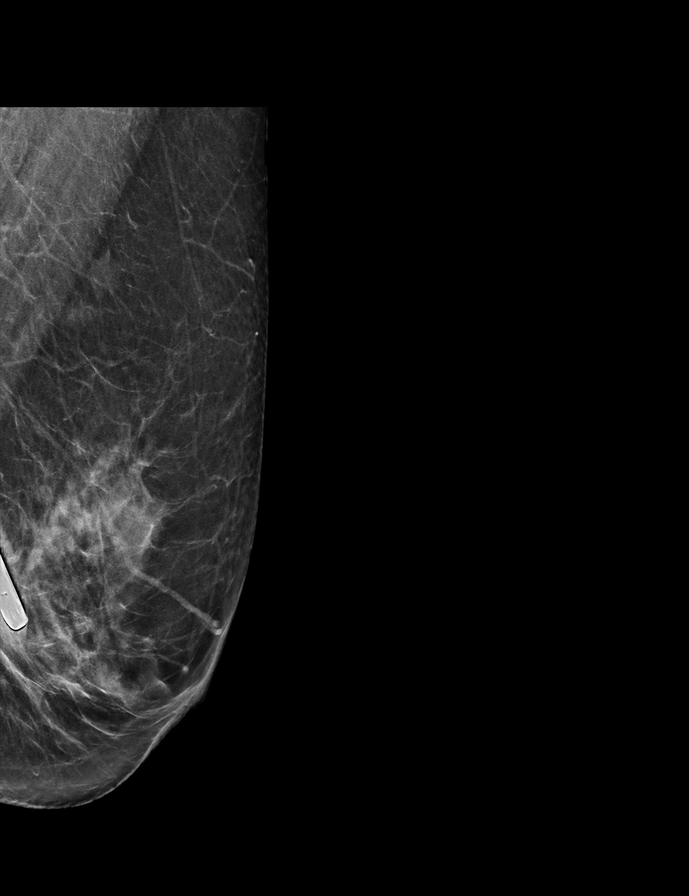

[L ML tomo · tomo slice 31/61.0]
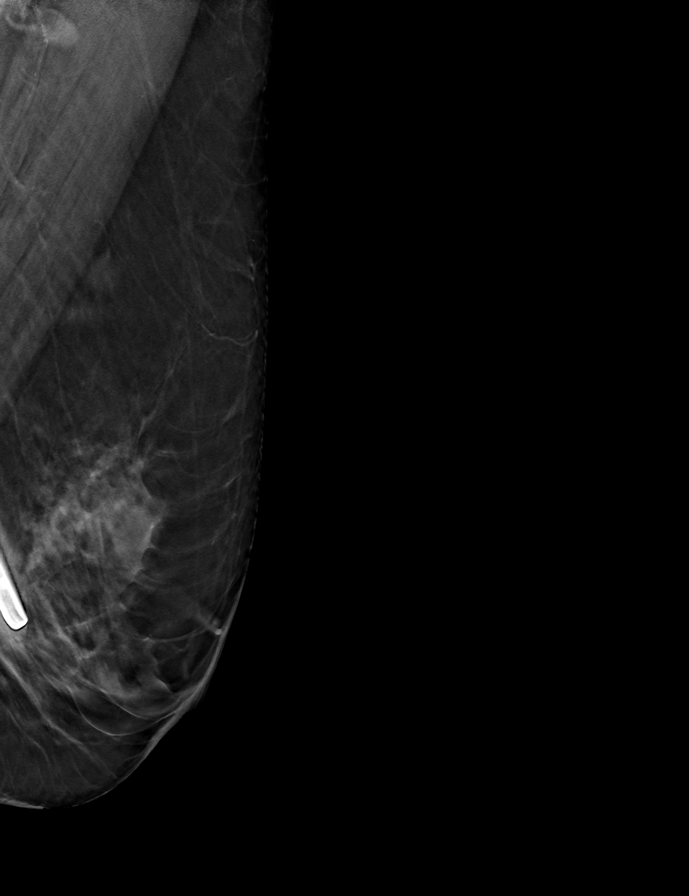

[R CC tomo · tomo slice 19/38.0]
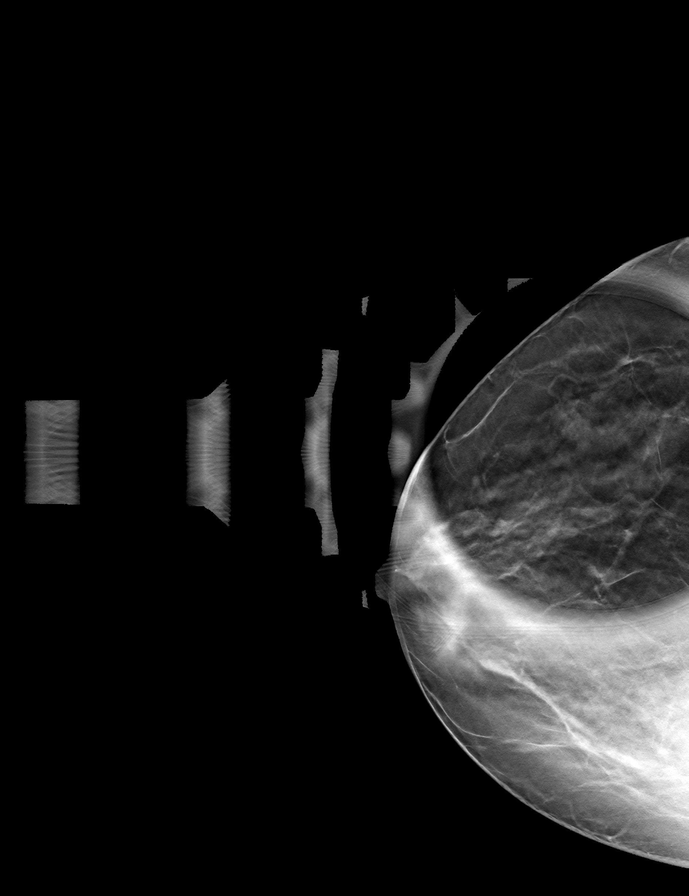

[R MLO tomo · tomo slice 23/45.0]
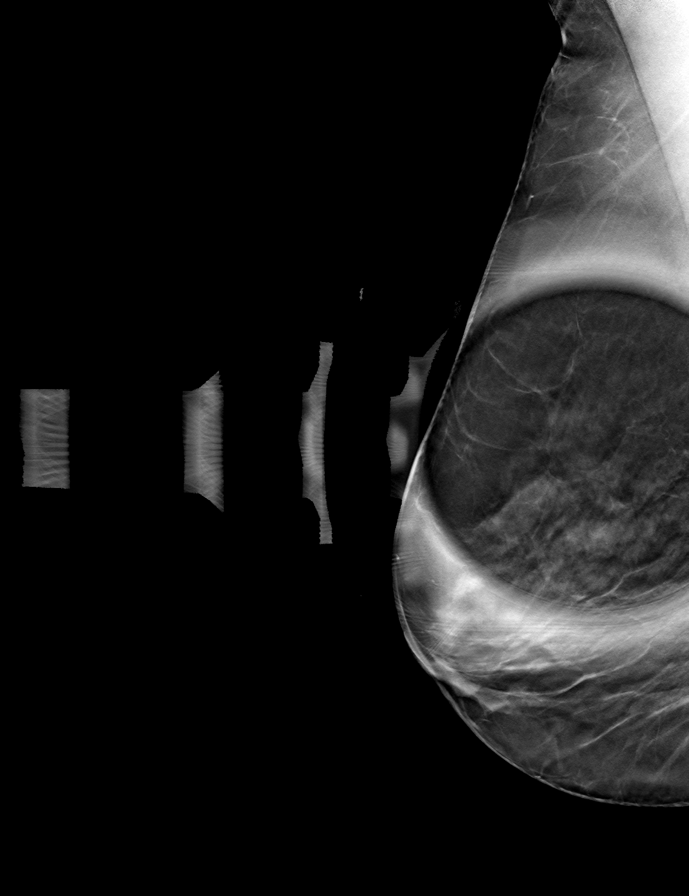

[L MLO tomo · tomo slice 27/52.0]
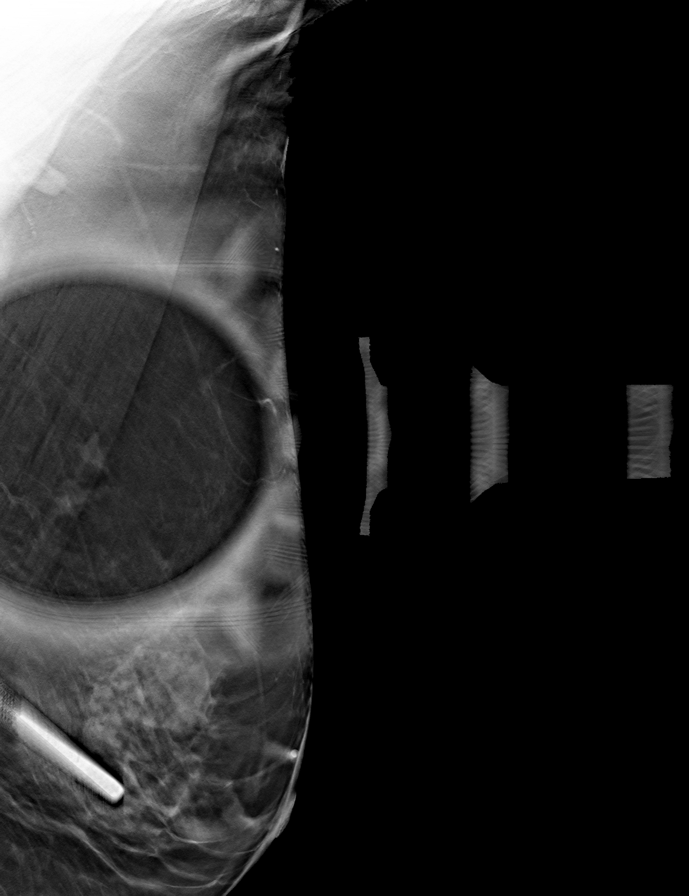

[8 of 24 positions shown; findings below may reference images not displayed]

ACR Breast Density Category b: There are scattered areas of
fibroglandular density.
FINDINGS: Lateral view of left breast, spot compression CC view of right
breast, spot compression MLO views of bilateral breasts are
submitted. Previously questioned mass in the right breast 9 o'clock
persists on additional views. The previously questioned asymmetry in
the posterior upper left breast persists and appears to have focal
notched fat.

Mammographic images were processed with CAD.

Targeted ultrasound is performed, showing normal 5 mm intramammary
lymph node at the right breast 9:30 o'clock 4 cm from nipple
correlating to the mammographic finding. Ultrasound of the upper
left breast demonstrate no focal abnormal discrete cystic or solid
lesion of correlate to the mammographic asymmetry.
IMPRESSION: Probable benign findings.

RECOMMENDATION:
Six-month follow-up mammogram left breast.

I have discussed the findings and recommendations with the patient.
Results were also provided in writing at the conclusion of the
visit. If applicable, a reminder letter will be sent to the patient
regarding the next appointment.

BI-RADS CATEGORY  3: Probably benign.

## 2018-06-04 IMAGING — US ULTRASOUND RIGHT BREAST LIMITED
1 series · 4 of 4 positions shown · non-contrast
Comparison: Previous exam(s).

CLINICAL DATA: Callback from screening mammogram for possible mass
right breast and possible asymmetry left breast.

EXAM:
DIGITAL DIAGNOSTIC BILATERAL MAMMOGRAM WITH CAD AND TOMO
ULTRASOUND BILATERAL BREAST

[Series 2: ultrasound right breast limited · 0.06mm/px · 4 of 4 slices shown]
[im 1/4]
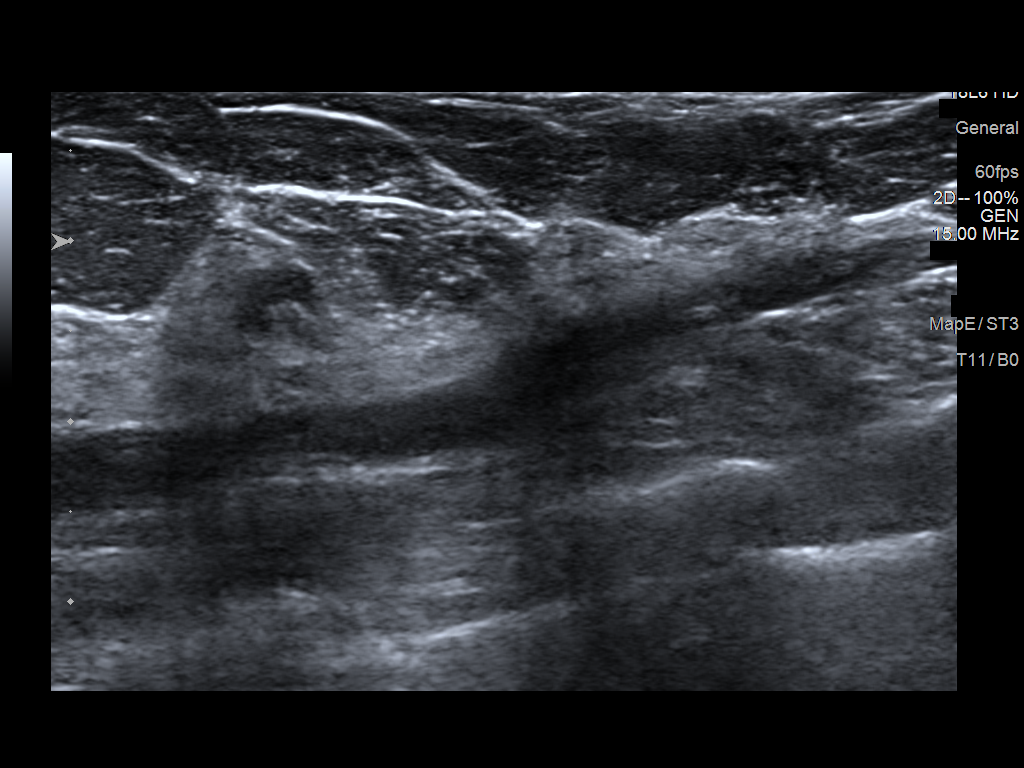
[im 2/4]
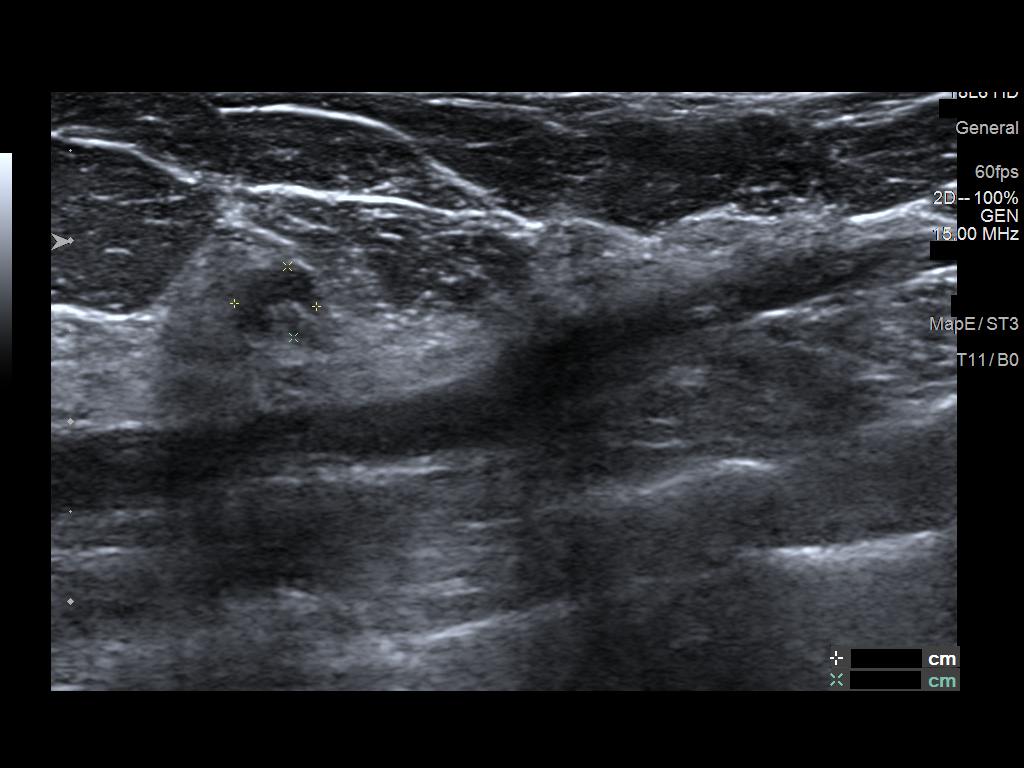
[im 3/4]
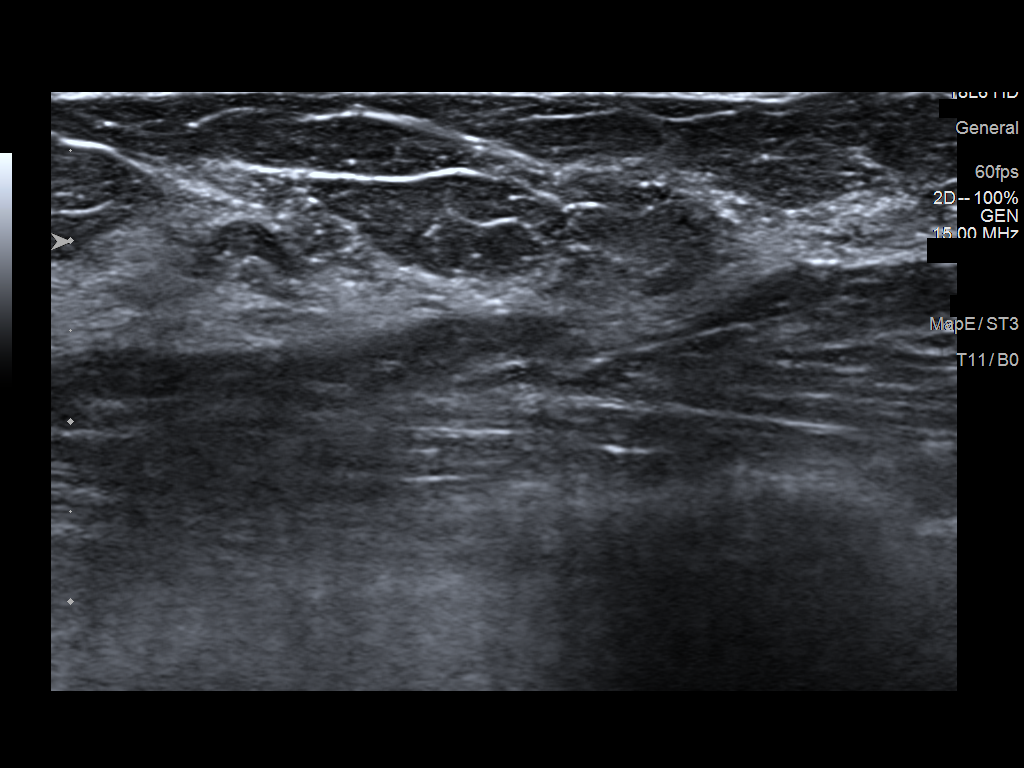
[im 4/4]
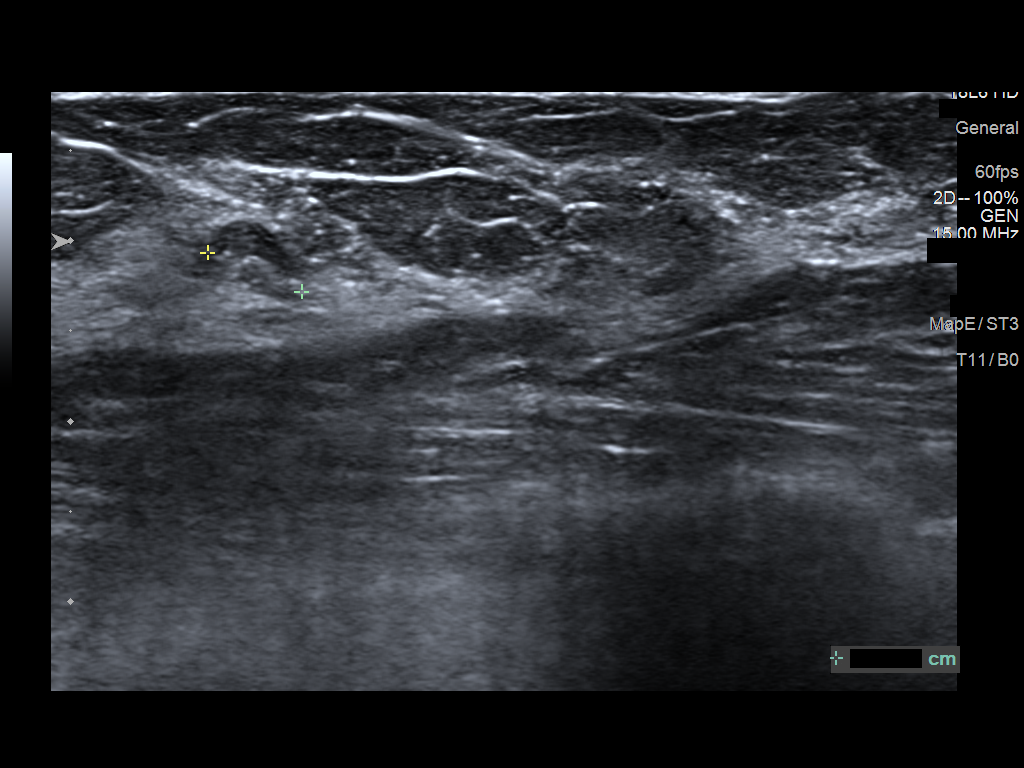

[4 of 4 positions shown; findings below may reference images not displayed]

ACR Breast Density Category b: There are scattered areas of
fibroglandular density.
FINDINGS: Lateral view of left breast, spot compression CC view of right
breast, spot compression MLO views of bilateral breasts are
submitted. Previously questioned mass in the right breast 9 o'clock
persists on additional views. The previously questioned asymmetry in
the posterior upper left breast persists and appears to have focal
notched fat.

Mammographic images were processed with CAD.

Targeted ultrasound is performed, showing normal 5 mm intramammary
lymph node at the right breast 9:30 o'clock 4 cm from nipple
correlating to the mammographic finding. Ultrasound of the upper
left breast demonstrate no focal abnormal discrete cystic or solid
lesion of correlate to the mammographic asymmetry.
IMPRESSION: Probable benign findings.

RECOMMENDATION:
Six-month follow-up mammogram left breast.

I have discussed the findings and recommendations with the patient.
Results were also provided in writing at the conclusion of the
visit. If applicable, a reminder letter will be sent to the patient
regarding the next appointment.

BI-RADS CATEGORY  3: Probably benign.

## 2018-06-14 DIAGNOSIS — G629 Polyneuropathy, unspecified: Secondary | ICD-10-CM | POA: Diagnosis not present

## 2018-06-14 DIAGNOSIS — R269 Unspecified abnormalities of gait and mobility: Secondary | ICD-10-CM | POA: Diagnosis not present

## 2018-06-14 DIAGNOSIS — Z9181 History of falling: Secondary | ICD-10-CM | POA: Diagnosis not present

## 2018-06-14 DIAGNOSIS — I10 Essential (primary) hypertension: Secondary | ICD-10-CM | POA: Diagnosis not present

## 2018-06-25 ENCOUNTER — Ambulatory Visit (INDEPENDENT_AMBULATORY_CARE_PROVIDER_SITE_OTHER): Payer: Medicare Other | Admitting: *Deleted

## 2018-06-25 DIAGNOSIS — R55 Syncope and collapse: Secondary | ICD-10-CM

## 2018-06-27 LAB — CUP PACEART REMOTE DEVICE CHECK
Date Time Interrogation Session: 20200312171706
Implantable Pulse Generator Implant Date: 20180212

## 2018-06-30 ENCOUNTER — Telehealth: Payer: Self-pay | Admitting: *Deleted

## 2018-06-30 NOTE — Telephone Encounter (Signed)
Left message letting patient know our office will be closed, on 07/01/2018, to disinfect the facility.  We will call back to reschedule the appointment.  

## 2018-07-01 ENCOUNTER — Telehealth: Payer: Self-pay | Admitting: Neurology

## 2018-07-01 ENCOUNTER — Ambulatory Visit: Payer: Medicare Other | Admitting: Neurology

## 2018-07-01 NOTE — Telephone Encounter (Signed)
Dr. Krista Blue left message requesting she call back to reschedule her new patient appt.  Please follow up.

## 2018-07-01 NOTE — Telephone Encounter (Signed)
I left message for her.  She is referred by her PCP for gait abnormalities,   If this is non-urgent, will reschedule her after May, if she needs to be see soon, then next available.

## 2018-07-02 NOTE — Progress Notes (Signed)
Carelink Summary Report / Loop Recorder 

## 2018-07-29 ENCOUNTER — Ambulatory Visit (INDEPENDENT_AMBULATORY_CARE_PROVIDER_SITE_OTHER): Payer: Medicare Other | Admitting: *Deleted

## 2018-07-29 ENCOUNTER — Other Ambulatory Visit: Payer: Self-pay

## 2018-07-29 DIAGNOSIS — R55 Syncope and collapse: Secondary | ICD-10-CM

## 2018-07-29 DIAGNOSIS — I447 Left bundle-branch block, unspecified: Secondary | ICD-10-CM

## 2018-07-29 LAB — CUP PACEART REMOTE DEVICE CHECK
Date Time Interrogation Session: 20200413172838
Implantable Pulse Generator Implant Date: 20180212

## 2018-08-08 NOTE — Progress Notes (Signed)
Carelink Summary Report / Loop Recorder 

## 2018-08-30 ENCOUNTER — Other Ambulatory Visit: Payer: Self-pay

## 2018-08-30 ENCOUNTER — Ambulatory Visit (INDEPENDENT_AMBULATORY_CARE_PROVIDER_SITE_OTHER): Payer: Medicare Other | Admitting: *Deleted

## 2018-08-30 DIAGNOSIS — R55 Syncope and collapse: Secondary | ICD-10-CM | POA: Diagnosis not present

## 2018-09-02 NOTE — Progress Notes (Signed)
Carelink Summary Report / Loop Recorder 

## 2018-09-18 DIAGNOSIS — N952 Postmenopausal atrophic vaginitis: Secondary | ICD-10-CM | POA: Diagnosis not present

## 2018-09-18 DIAGNOSIS — R300 Dysuria: Secondary | ICD-10-CM | POA: Diagnosis not present

## 2018-09-23 DIAGNOSIS — N390 Urinary tract infection, site not specified: Secondary | ICD-10-CM | POA: Diagnosis not present

## 2018-09-23 DIAGNOSIS — J0100 Acute maxillary sinusitis, unspecified: Secondary | ICD-10-CM | POA: Diagnosis not present

## 2018-09-23 DIAGNOSIS — R300 Dysuria: Secondary | ICD-10-CM | POA: Diagnosis not present

## 2018-09-23 DIAGNOSIS — R269 Unspecified abnormalities of gait and mobility: Secondary | ICD-10-CM | POA: Diagnosis not present

## 2018-09-27 DIAGNOSIS — J019 Acute sinusitis, unspecified: Secondary | ICD-10-CM | POA: Diagnosis not present

## 2018-09-27 DIAGNOSIS — R300 Dysuria: Secondary | ICD-10-CM | POA: Diagnosis not present

## 2018-10-01 DIAGNOSIS — L821 Other seborrheic keratosis: Secondary | ICD-10-CM | POA: Diagnosis not present

## 2018-10-01 DIAGNOSIS — L218 Other seborrheic dermatitis: Secondary | ICD-10-CM | POA: Diagnosis not present

## 2018-10-01 DIAGNOSIS — D485 Neoplasm of uncertain behavior of skin: Secondary | ICD-10-CM | POA: Diagnosis not present

## 2018-10-01 DIAGNOSIS — L820 Inflamed seborrheic keratosis: Secondary | ICD-10-CM | POA: Diagnosis not present

## 2018-10-02 ENCOUNTER — Ambulatory Visit (INDEPENDENT_AMBULATORY_CARE_PROVIDER_SITE_OTHER): Payer: Medicare Other | Admitting: *Deleted

## 2018-10-02 DIAGNOSIS — R55 Syncope and collapse: Secondary | ICD-10-CM | POA: Diagnosis not present

## 2018-10-03 LAB — CUP PACEART REMOTE DEVICE CHECK
Date Time Interrogation Session: 20200618082743
Implantable Pulse Generator Implant Date: 20180212

## 2018-10-14 NOTE — Progress Notes (Signed)
Carelink Summary Report / Loop Recorder 

## 2018-11-04 ENCOUNTER — Ambulatory Visit (INDEPENDENT_AMBULATORY_CARE_PROVIDER_SITE_OTHER): Payer: Medicare Other | Admitting: *Deleted

## 2018-11-04 DIAGNOSIS — R55 Syncope and collapse: Secondary | ICD-10-CM | POA: Diagnosis not present

## 2018-11-05 LAB — CUP PACEART REMOTE DEVICE CHECK
Date Time Interrogation Session: 20200721054636
Implantable Pulse Generator Implant Date: 20180212

## 2018-11-18 NOTE — Progress Notes (Signed)
Carelink Summary Report / Loop Recorder 

## 2018-12-02 ENCOUNTER — Telehealth: Payer: Self-pay

## 2018-12-02 NOTE — Telephone Encounter (Signed)
I tried to help the pt send a manual transmission with her home monitor. It gave her the error code [id number]. I told her I will give her a call back in 30 minutes to try again. It it do not work I will give her the number to Media.

## 2018-12-02 NOTE — Telephone Encounter (Signed)
Transmission received.

## 2018-12-04 NOTE — Telephone Encounter (Signed)
Full report reviewed. ECGs show false tachy detections--TWOS and undersensing noted.

## 2018-12-05 ENCOUNTER — Other Ambulatory Visit: Payer: Self-pay

## 2018-12-05 ENCOUNTER — Other Ambulatory Visit: Payer: Self-pay | Admitting: Internal Medicine

## 2018-12-05 ENCOUNTER — Ambulatory Visit
Admission: RE | Admit: 2018-12-05 | Discharge: 2018-12-05 | Disposition: A | Discharge status: Home / Self Care | Payer: Medicare Other | Source: Ambulatory Visit | Attending: Internal Medicine | Admitting: Internal Medicine

## 2018-12-05 DIAGNOSIS — N6489 Other specified disorders of breast: Secondary | ICD-10-CM

## 2018-12-05 IMAGING — MG DIGITAL DIAGNOSTIC UNILATERAL LEFT MAMMOGRAM WITH TOMO AND CAD
4 series · 4 of 12 positions shown · non-contrast
Comparison: Previous exam(s).

CLINICAL DATA: Six-month follow-up of a left breast asymmetry.

EXAM:
DIGITAL DIAGNOSTIC UNILATERAL LEFT MAMMOGRAM WITH CAD AND TOMO

[L MLO synth-2D]
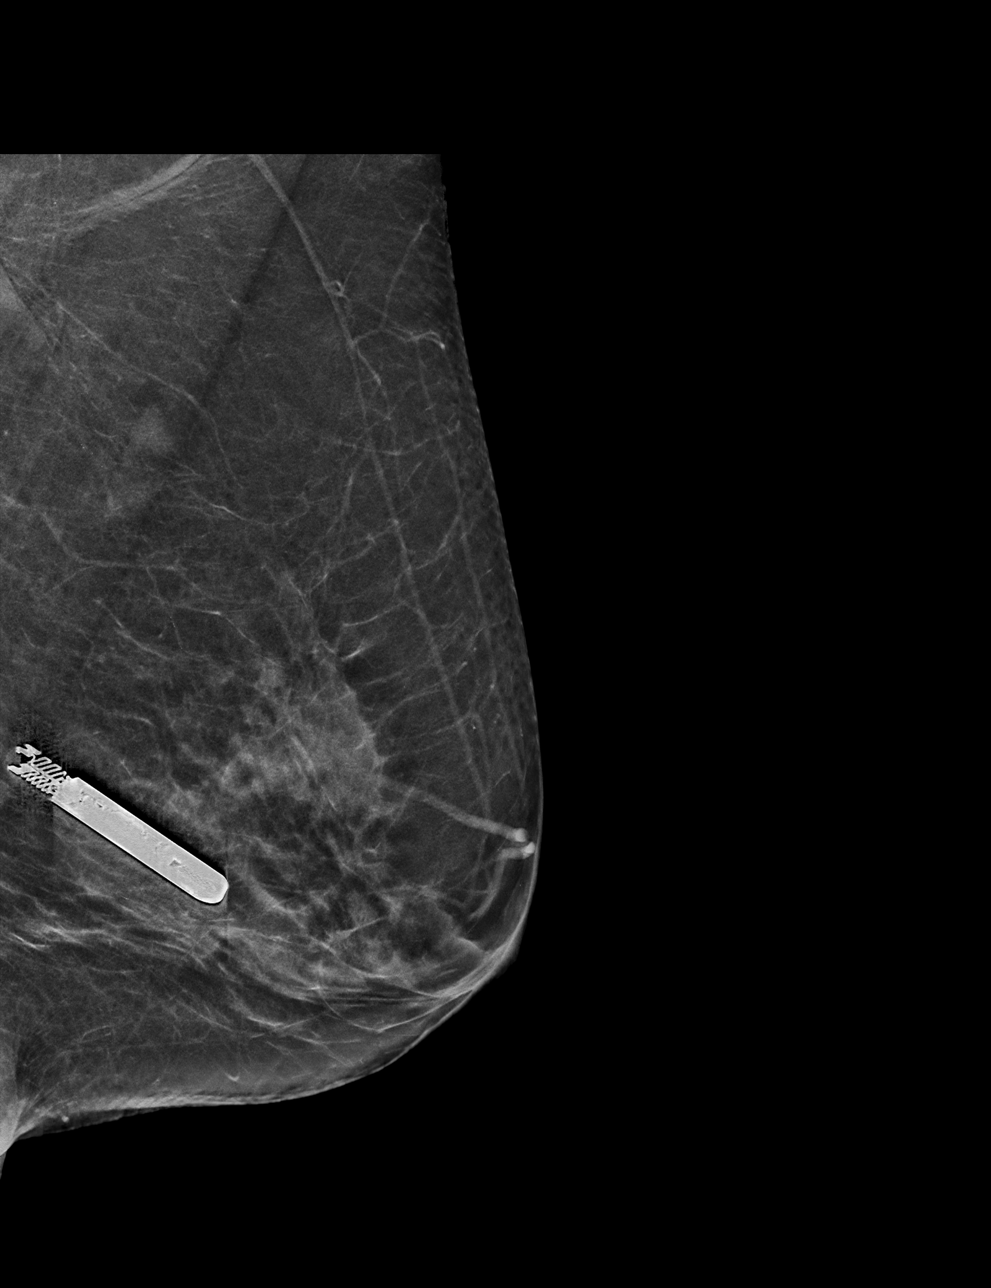

[L CC synth-2D]
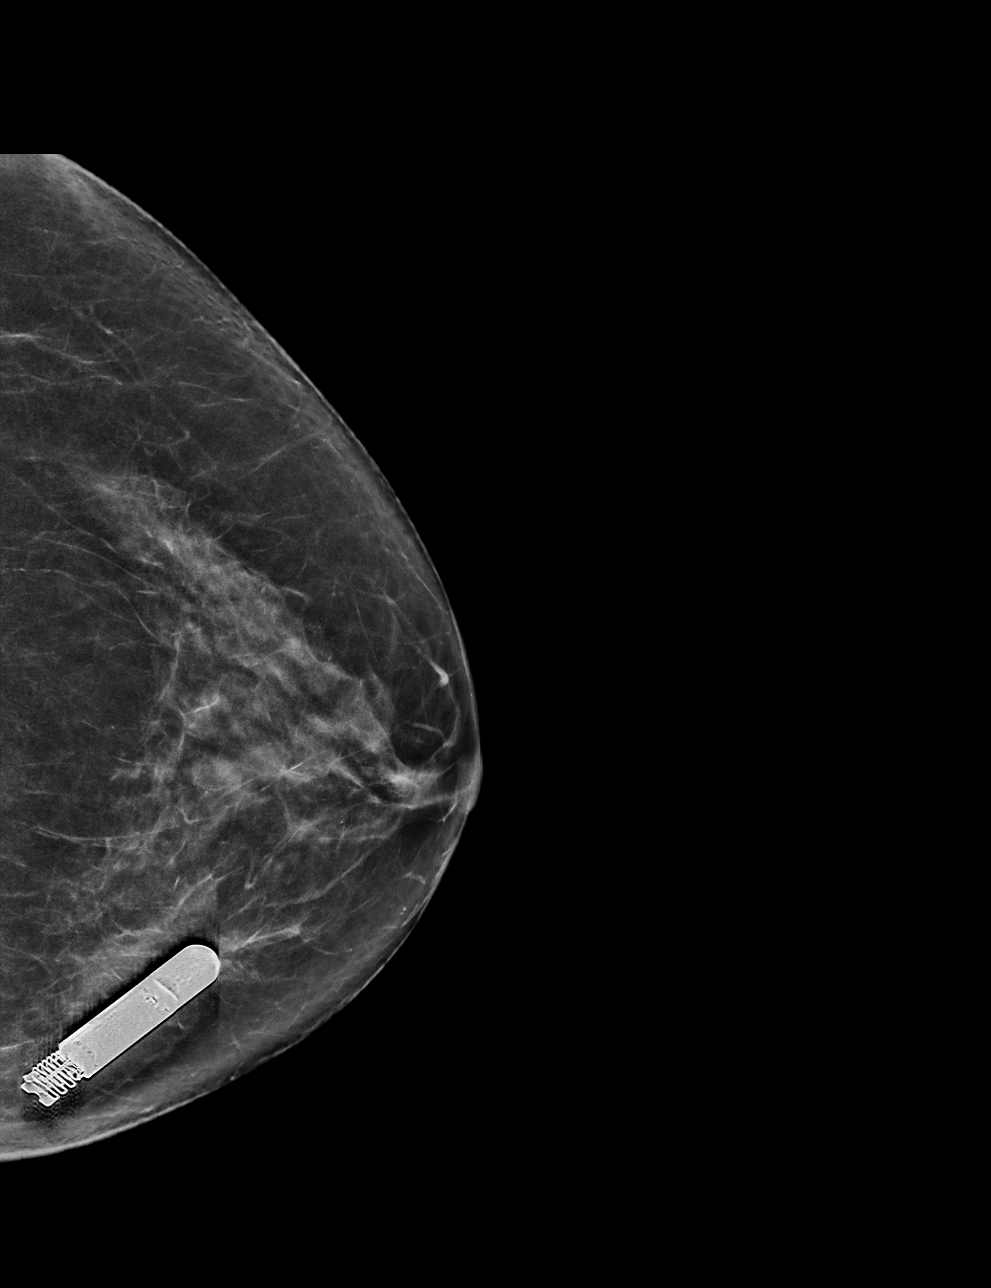

[L CC tomo · tomo slice 31/60.0]
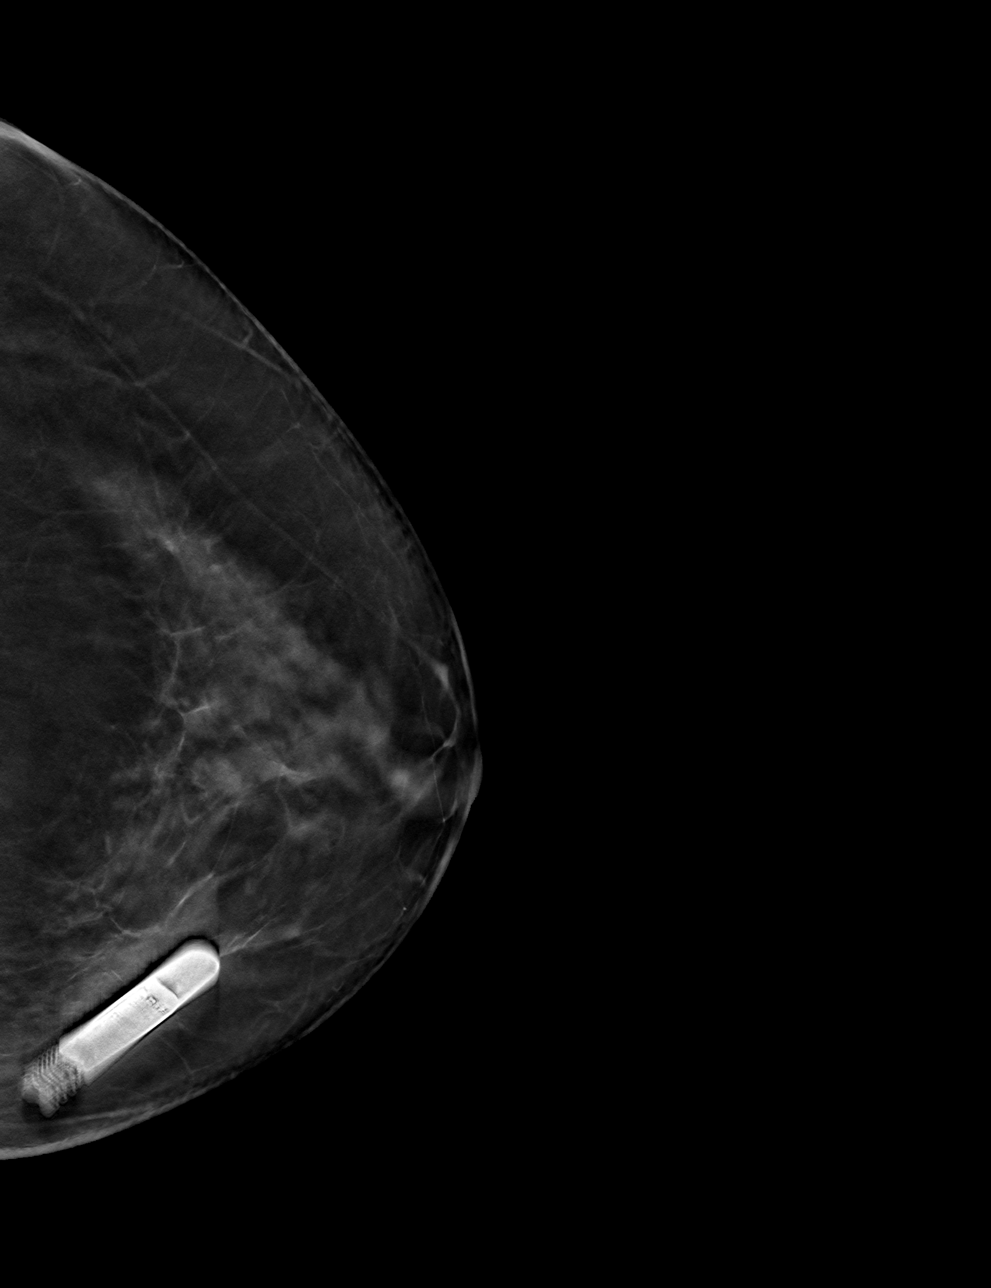

[L MLO tomo · tomo slice 29/57.0]
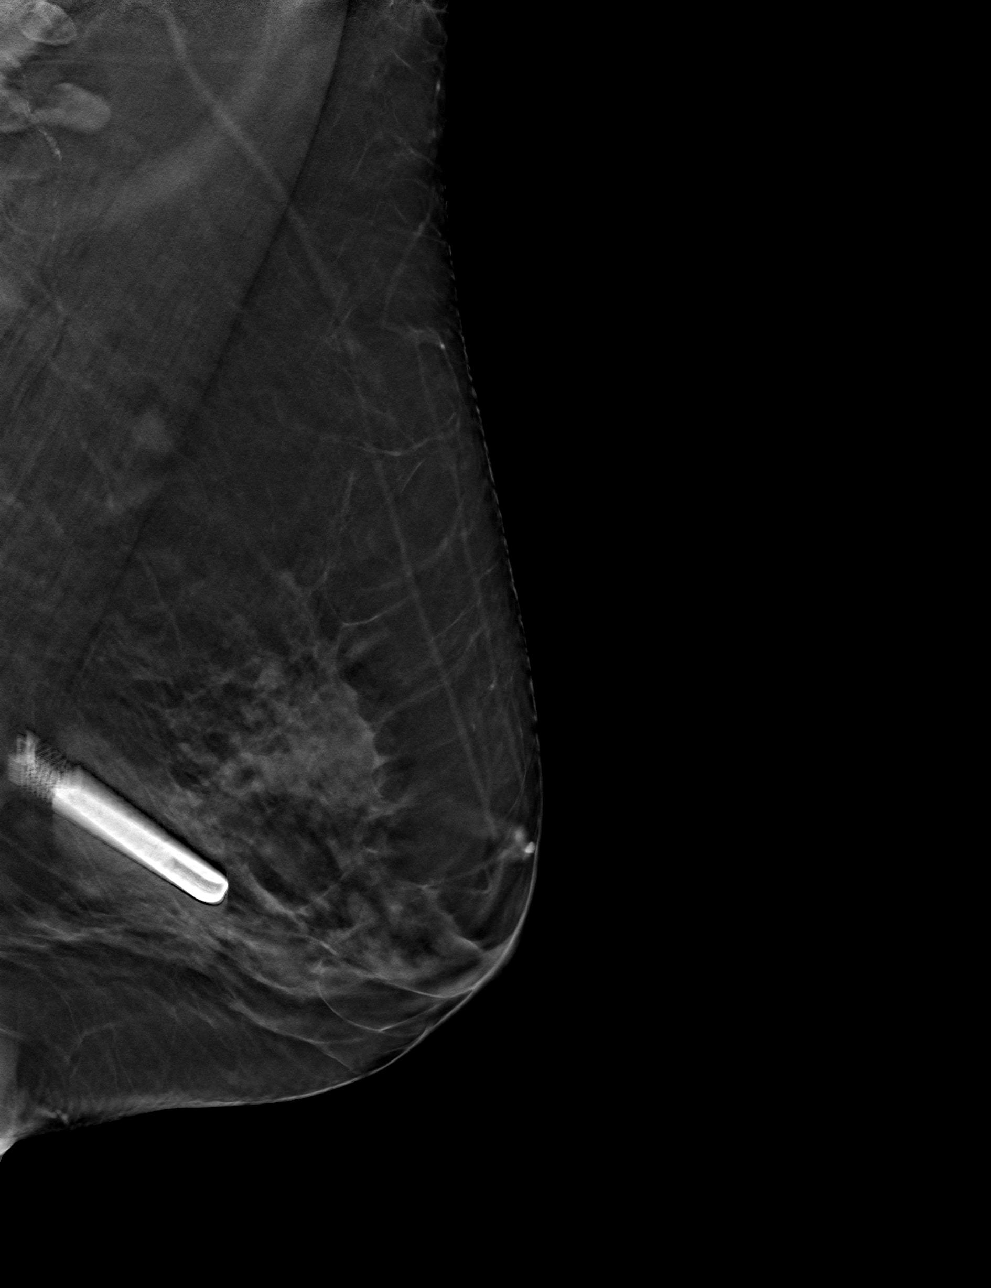

[4 of 12 positions shown; findings below may reference images not displayed]

ACR Breast Density Category c: The breast tissue is heterogeneously
dense, which may obscure small masses.
FINDINGS: The asymmetry in the superior left breast persists on today's
imaging, unchanged. No other suspicious findings are seen in the
left breast.

Mammographic images were processed with CAD.
IMPRESSION: Stable probably benign left breast asymmetry.

RECOMMENDATION:
Six-month follow-up diagnostic mammography of the probably benign
left breast asymmetry. The patient will be due for bilateral
mammography at that time.

I have discussed the findings and recommendations with the patient.
Results were also provided in writing at the conclusion of the
visit. If applicable, a reminder letter will be sent to the patient
regarding the next appointment.

BI-RADS CATEGORY  3: Probably benign.

## 2018-12-09 ENCOUNTER — Ambulatory Visit (INDEPENDENT_AMBULATORY_CARE_PROVIDER_SITE_OTHER): Payer: Medicare Other | Admitting: *Deleted

## 2018-12-09 DIAGNOSIS — I4891 Unspecified atrial fibrillation: Secondary | ICD-10-CM

## 2018-12-11 LAB — CUP PACEART REMOTE DEVICE CHECK
Date Time Interrogation Session: 20200826072642
Implantable Pulse Generator Implant Date: 20180212

## 2018-12-16 NOTE — Progress Notes (Signed)
Carelink Summary Report / Loop Recorder 

## 2019-01-02 ENCOUNTER — Telehealth: Payer: Self-pay | Admitting: *Deleted

## 2019-01-02 ENCOUNTER — Encounter: Payer: Self-pay | Admitting: Neurology

## 2019-01-02 ENCOUNTER — Ambulatory Visit: Payer: Medicare Other | Admitting: Neurology

## 2019-01-02 NOTE — Telephone Encounter (Signed)
No showed new patient appointment. 

## 2019-01-09 ENCOUNTER — Ambulatory Visit (INDEPENDENT_AMBULATORY_CARE_PROVIDER_SITE_OTHER): Payer: Medicare Other | Admitting: *Deleted

## 2019-01-09 DIAGNOSIS — R55 Syncope and collapse: Secondary | ICD-10-CM

## 2019-01-09 LAB — CUP PACEART REMOTE DEVICE CHECK
Date Time Interrogation Session: 20200924172038
Implantable Pulse Generator Implant Date: 20180212

## 2019-01-16 NOTE — Progress Notes (Signed)
Carelink Summary Report / Loop Recorder 

## 2019-01-20 DIAGNOSIS — R110 Nausea: Secondary | ICD-10-CM | POA: Diagnosis not present

## 2019-01-20 DIAGNOSIS — J069 Acute upper respiratory infection, unspecified: Secondary | ICD-10-CM | POA: Diagnosis not present

## 2019-01-23 DIAGNOSIS — J019 Acute sinusitis, unspecified: Secondary | ICD-10-CM | POA: Diagnosis not present

## 2019-01-23 DIAGNOSIS — J209 Acute bronchitis, unspecified: Secondary | ICD-10-CM | POA: Diagnosis not present

## 2019-02-06 DIAGNOSIS — R05 Cough: Secondary | ICD-10-CM | POA: Diagnosis not present

## 2019-02-11 ENCOUNTER — Ambulatory Visit (INDEPENDENT_AMBULATORY_CARE_PROVIDER_SITE_OTHER): Payer: Medicare Other | Admitting: *Deleted

## 2019-02-11 DIAGNOSIS — I4891 Unspecified atrial fibrillation: Secondary | ICD-10-CM

## 2019-02-11 DIAGNOSIS — I447 Left bundle-branch block, unspecified: Secondary | ICD-10-CM

## 2019-02-12 LAB — CUP PACEART REMOTE DEVICE CHECK
Date Time Interrogation Session: 20201028085049
Implantable Pulse Generator Implant Date: 20180212

## 2019-03-04 NOTE — Progress Notes (Signed)
Carelink Summary Report / Loop Recorder 

## 2019-03-07 DIAGNOSIS — E785 Hyperlipidemia, unspecified: Secondary | ICD-10-CM | POA: Diagnosis not present

## 2019-03-07 DIAGNOSIS — F4321 Adjustment disorder with depressed mood: Secondary | ICD-10-CM | POA: Diagnosis not present

## 2019-03-07 DIAGNOSIS — I10 Essential (primary) hypertension: Secondary | ICD-10-CM | POA: Diagnosis not present

## 2019-03-07 DIAGNOSIS — J45909 Unspecified asthma, uncomplicated: Secondary | ICD-10-CM | POA: Diagnosis not present

## 2019-03-11 DIAGNOSIS — M79642 Pain in left hand: Secondary | ICD-10-CM | POA: Diagnosis not present

## 2019-03-11 DIAGNOSIS — S63635A Sprain of interphalangeal joint of left ring finger, initial encounter: Secondary | ICD-10-CM | POA: Diagnosis not present

## 2019-03-17 ENCOUNTER — Ambulatory Visit (INDEPENDENT_AMBULATORY_CARE_PROVIDER_SITE_OTHER): Payer: Medicare Other | Admitting: *Deleted

## 2019-03-17 DIAGNOSIS — R55 Syncope and collapse: Secondary | ICD-10-CM

## 2019-03-17 LAB — CUP PACEART REMOTE DEVICE CHECK
Date Time Interrogation Session: 20201130091301
Implantable Pulse Generator Implant Date: 20180212

## 2019-03-20 ENCOUNTER — Telehealth: Payer: Self-pay | Admitting: Internal Medicine

## 2019-03-20 NOTE — Telephone Encounter (Signed)
New Message:  Patient would like to make an appointment to have her implant removed. Her insurance is changing on January 1, so she would like to get it removed before the end of the calendar year.   Please advise

## 2019-03-24 ENCOUNTER — Ambulatory Visit (INDEPENDENT_AMBULATORY_CARE_PROVIDER_SITE_OTHER): Payer: Medicare Other | Admitting: Internal Medicine

## 2019-03-24 ENCOUNTER — Encounter: Payer: Self-pay | Admitting: Internal Medicine

## 2019-03-24 ENCOUNTER — Other Ambulatory Visit: Payer: Self-pay

## 2019-03-24 VITALS — BP 142/82 | HR 79 | Ht 60.0 in | Wt 116.4 lb

## 2019-03-24 DIAGNOSIS — R55 Syncope and collapse: Secondary | ICD-10-CM

## 2019-03-24 DIAGNOSIS — K5904 Chronic idiopathic constipation: Secondary | ICD-10-CM | POA: Diagnosis not present

## 2019-03-24 NOTE — Progress Notes (Signed)
HPI [name] returns today for ongoing followup of syncope and is s/p ILR insertion. The patient also has a h/o LBBB, and HTN. In the interim, she has done well. No syncope. No edema.She presents today to have her ILR removed. Allergies  Allergen Reactions  . Hydrochlorothiazide Nausea Only  . Penicillins Other (See Comments)    Unknown Other reaction(s): Other (See Comments) Pt was a child  . Adhesive [Tape]     Pt states ' it took my skin off' Steri-Strips  . Codeine Nausea And Vomiting  . Sulfa Antibiotics Nausea And Vomiting  . Vicodin [Hydrocodone-Acetaminophen] Nausea And Vomiting and Other (See Comments)    Did not feel like herself     Current Outpatient Medications  Medication Sig Dispense Refill  . albuterol (PROVENTIL HFA;VENTOLIN HFA) 108 (90 Base) MCG/ACT inhaler Inhale 1-2 puffs into the lungs every 4 (four) hours as needed for wheezing or shortness of breath.    Marland Kitchen aspirin EC 81 MG tablet Take 81 mg by mouth daily.    Marland Kitchen b complex vitamins tablet Take 1 tablet by mouth daily.    Marland Kitchen FLUoxetine (PROZAC) 10 MG capsule Take 10 mg by mouth daily.    . fluticasone (FLONASE) 50 MCG/ACT nasal spray Place 2 sprays into both nostrils as needed for allergies or rhinitis.    Marland Kitchen loratadine (CLARITIN) 10 MG tablet Take 10 mg by mouth daily.    . Multiple Vitamins-Minerals (CENTRUM SILVER 50+WOMEN PO) Take 1 tablet by mouth daily.    . pantoprazole (PROTONIX) 40 MG tablet Take 40 mg by mouth 2 (two) times daily.    . rosuvastatin (CRESTOR) 10 MG tablet Take 10 mg by mouth daily.    . sucralfate (CARAFATE) 1 g tablet Take 1 g by mouth 2 (two) times daily as needed (Takes if Pantoprazole is not working).     Marland Kitchen VITAMIN D PO Take 1 tablet by mouth daily.     No current facility-administered medications for this visit.      Past Medical History:  Diagnosis Date  . Allergic rhinitis due to other allergen   . Anginal pain ([place]) 2011   hospitalized for CP in the past; due to  low potassium  . Anxiety   . Arthritis   . Childhood asthma   . Chronic hoarseness   . Gastroparesis   . GERD (gastroesophageal reflux disease)   . History of hiatal hernia   . Hx of blood transfusion reaction    in the early 80s  . Hypertension   . Left bundle branch block     (abnormal heart rhythm)  . PONV (postoperative nausea and vomiting)   . Pyloric stenosis     ROS:   All systems reviewed and negative except as noted in the HPI.   Past Surgical History:  Procedure Laterality Date  . ABDOMINAL HYSTERECTOMY  1983  . CARDIAC CATHETERIZATION  10/2009   Cardiac cath normal coronary arteries  . CERVICAL DISC ARTHROPLASTY     disc replacement in neck 1998  . COLONOSCOPY     10 year repeat 09/26/2010  . ESOPHAGOGASTRODUODENOSCOPY  09/26/2010,01/08/13  . LOOP RECORDER INSERTION N/A 05/29/2016   Procedure: Loop Recorder Insertion;  Surgeon: Evans Lance, MD;  Location: [facility];  Service: Cardiovascular;  Laterality: N/A;  . OTHER SURGICAL HISTORY  1983   hysterectomy ovaries intact, prolapsed   . OTHER SURGICAL HISTORY  2006   pyloric stricture surgery  . TOTAL HIP  ARTHROPLASTY Right 06/07/2015   Procedure: TOTAL HIP ARTHROPLASTY ANTERIOR APPROACH;  Surgeon: Frederik Pear, MD;  Location: [facility];  Service: Orthopedics;  Laterality: Right;     Family History  Problem Relation Age of Onset  . Hypertension Mother   . Cirrhosis Father   . Hypertension Father   . OCD Sister   . Depression Sister   . Depression Sister   . Hypertension Sister      Social History   Socioeconomic History  . Marital status: Married    Spouse name: Not on file  . Number of children: Not on file  . Years of education: Not on file  . Highest education level: Not on file  Occupational History  . Not on file  Social Needs  . Financial resource strain: Not on file  . Food insecurity    Worry: Not on file    Inability: Not on file  . Transportation needs    Medical: Not on file     Non-medical: Not on file  Tobacco Use  . Smoking status: Never Smoker  . Smokeless tobacco: Never Used  Substance and Sexual Activity  . Alcohol use: No    Alcohol/week: 0.0 standard drinks  . Drug use: No  . Sexual activity: Not on file  Lifestyle  . Physical activity    Days per week: Not on file    Minutes per session: Not on file  . Stress: Not on file  Relationships  . Social Herbalist on phone: Not on file    Gets together: Not on file    Attends religious service: Not on file    Active member of club or organization: Not on file    Attends meetings of clubs or organizations: Not on file    Relationship status: Not on file  . Intimate partner violence    Fear of current or ex partner: Not on file    Emotionally abused: Not on file    Physically abused: Not on file    Forced sexual activity: Not on file  Other Topics Concern  . Not on file  Social History Narrative  . Not on file     BP (!) 142/82   Pulse 79   Ht 5' (1.524 m)   Wt 116 lb 6.4 oz (52.8 kg)   SpO2 97%   BMI 22.73 kg/m   Physical Exam:  Well appearing NAD HEENT: Unremarkable Neck:  No JVD, no thyromegally Lymphatics:  No adenopathy Back:  No CVA tenderness Lungs:  Clear with no wheezes HEART:  Regular rate rhythm, no murmurs, no rubs, no clicks Abd:  soft, positive bowel sounds, no organomegally, no rebound, no guarding Ext:  2 plus pulses, no edema, no cyanosis, no clubbing Skin:  No rashes no nodules Neuro:  CN II through XII intact, motor grossly intact  DEVICE  Normal device function.  See PaceArt for details. No arrhythmias.  Assess/Plan: 1. Syncope - the etiology is unclear and she has not had any arryhthmias. She would like to have her ILR removed. I have reviewed the indications/risks/benefits/ of ILR removal and she is willing to proceed.  EP Procedure Note  Procedure performed: ILR removal.  Preoperative diagnosis: syncope, unexplained.  Postoperative  diagnosis: same as preop diagnosis  Description of the procedure: after informed consent was obtained, the patient was prepped and draped in a sterile fashion. 10 cc of lidocaine was infiltrated into the left pectoral region adjacent to the site of her  ILR insertion. A one cm incision was carried out. A combination of blunt and sharp dissection was carried out and the ILR was removed. A stitch was placed with 4-0 prolene suture. A bandage was placed and the patient returned to her room in stable condition.   Complications: none immediately  Conclusion: successful removal of a [employer] ILR in a patient with a h/o unexplained syncope.   Mikle Bosworth.D.

## 2019-03-24 NOTE — Patient Instructions (Addendum)
Medication Instructions:  Your physician recommends that you continue on your current medications as directed. Please refer to the Current Medication list given to you today.  Labwork: None ordered.  Testing/Procedures: None ordered.  Follow-Up: Your physician wants you to follow-up in: as needed with Dr. Lovena Le.  Any Other Special Instructions Will Be Listed Below (If Applicable).  If you need a refill on your cardiac medications before your next appointment, please call your pharmacy.    Implantable Loop Recorder Removal, Care After This sheet gives you information about how to care for yourself after your procedure. Your health care provider may also give you more specific instructions. If you have problems or questions, contact your health care provider. What can I expect after the procedure? After the procedure, it is common to have:  Soreness or discomfort near the incision.  Some swelling or bruising near the incision. Follow these instructions at home:    Leave your outer bandage on for 3 days.  Do not sure for these 3 days. On the 3rd day remove your bandage.  At this point you may shower. Avoid bath tubs, swimming pools or hot tubs until your incision is completely healed.  Incision care  Follow instructions from your health care provider about how to take care of your incision. Make sure you:    Check your incision area every day for signs of infection. Check for: ? Redness, swelling, or pain. ? Fluid or blood. ? Warmth. ? Pus or a bad smell.  Activity  Return to your normal activities   Contact a health care provider if:  You have redness, swelling, or pain around your incision.  You have a fever.  You have pain that is not relieved by your pain medicine.  You have triggered your device because of fainting (syncope) or because of a heartbeat that feels like it is racing, slow, fluttering, or skipping (palpitations). Get help right away if you  have:  Chest pain.  Difficulty breathing. Summary  After the procedure, it is common to have soreness or discomfort near the incision.  Change your dressing as told by your health care provider.  Follow instructions from your health care provider about how to manage your implantable loop recorder and transmit the information.  Keep all follow-up visits as told by your health care provider. This is important. This information is not intended to replace advice given to you by your health care provider. Make sure you discuss any questions you have with your health care provider. Document Released: 03/15/2015 Document Revised: 05/19/2017 Document Reviewed: 05/19/2017 Elsevier Patient Education  2020 [employer].

## 2019-04-09 NOTE — Progress Notes (Signed)
ILR remote 

## 2019-04-17 ENCOUNTER — Ambulatory Visit (INDEPENDENT_AMBULATORY_CARE_PROVIDER_SITE_OTHER): Payer: Medicare Other | Admitting: *Deleted

## 2019-04-17 DIAGNOSIS — I4891 Unspecified atrial fibrillation: Secondary | ICD-10-CM

## 2019-04-17 LAB — CUP PACEART REMOTE DEVICE CHECK
Date Time Interrogation Session: 20201231102644
Implantable Pulse Generator Implant Date: 20180212

## 2019-05-15 ENCOUNTER — Ambulatory Visit: Payer: Medicare Other | Admitting: Podiatry

## 2019-05-17 ENCOUNTER — Ambulatory Visit: Payer: Medicare Other

## 2019-05-19 ENCOUNTER — Ambulatory Visit
Admission: RE | Admit: 2019-05-19 | Discharge: 2019-05-19 | Disposition: A | Discharge status: Home / Self Care | Payer: Medicare Other | Source: Ambulatory Visit | Attending: Internal Medicine | Admitting: Internal Medicine

## 2019-05-19 ENCOUNTER — Other Ambulatory Visit: Payer: Self-pay

## 2019-05-19 DIAGNOSIS — N6489 Other specified disorders of breast: Secondary | ICD-10-CM

## 2019-05-19 IMAGING — MG DIGITAL DIAGNOSTIC BILAT W/ TOMO W/ CAD
8 series · 9 of 24 positions shown · non-contrast
Comparison: Previous exam(s).

CLINICAL DATA: 68-year-old female for 1 year follow-up of UPPER
LEFT breast asymmetry without sonographic correlate. Also for annual
bilateral mammogram.

EXAM:
DIGITAL DIAGNOSTIC BILATERAL MAMMOGRAM WITH CAD AND TOMO

[L MLO synth-2D]
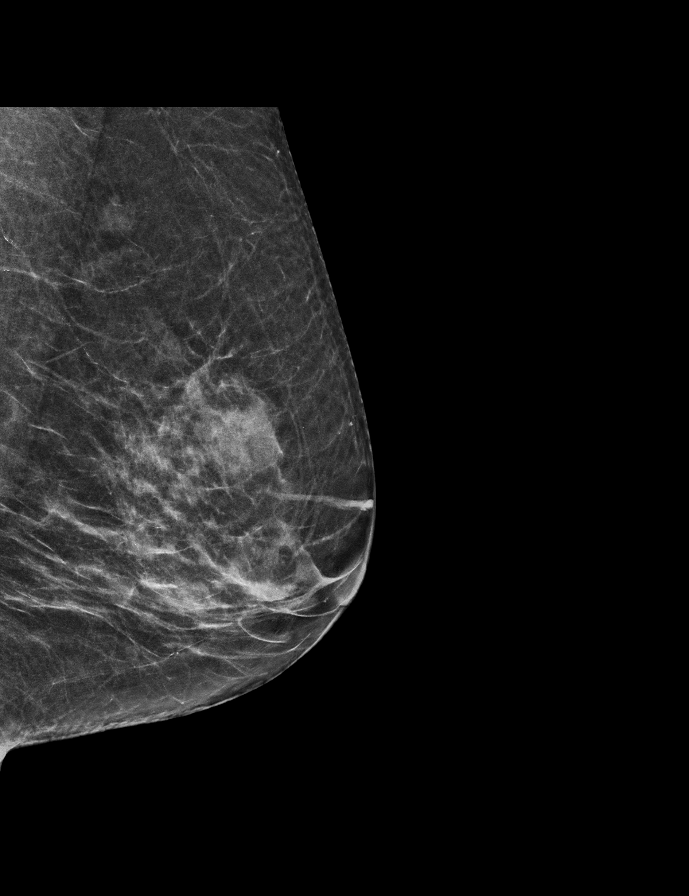

[R MLO synth-2D]
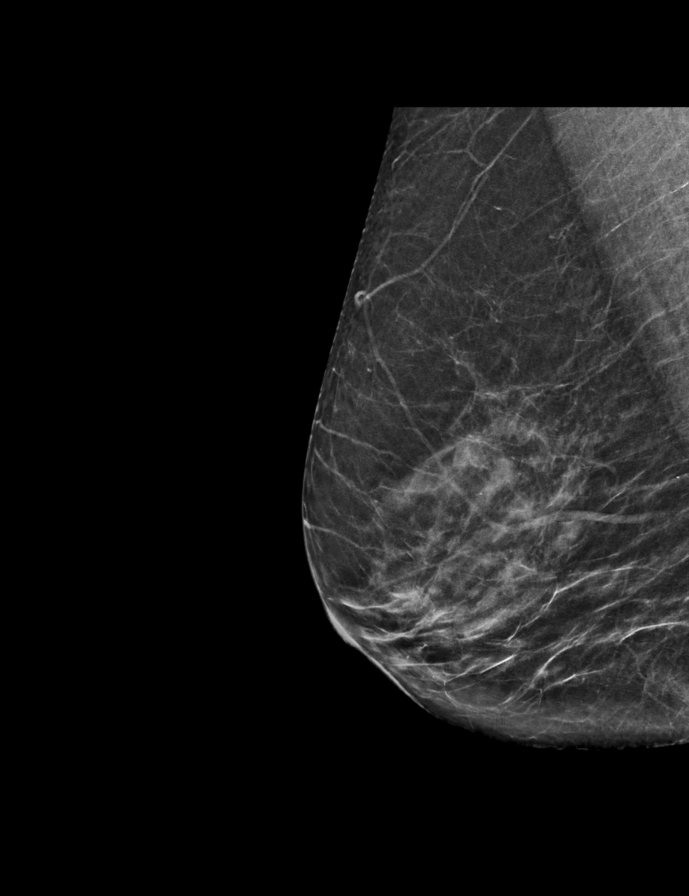

[L CC synth-2D]
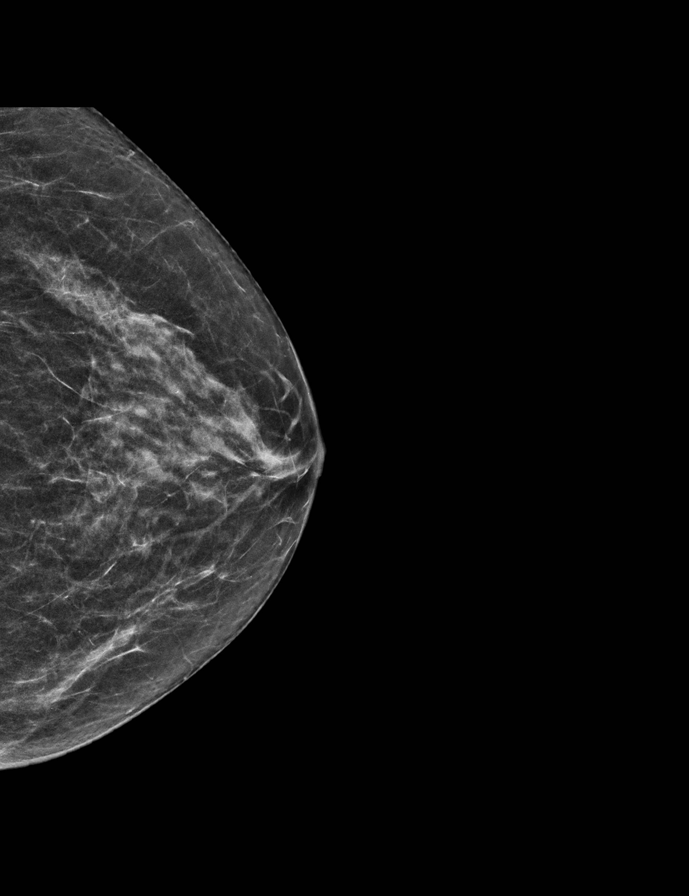

[R CC synth-2D]
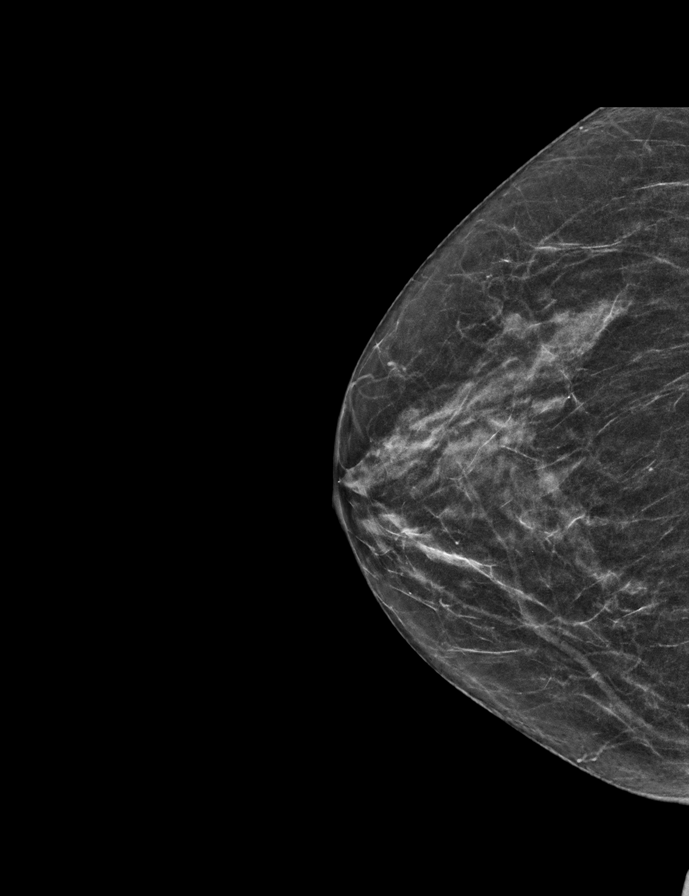

[R CC tomo · 2 of 48 frames shown]
[frame 16/48]
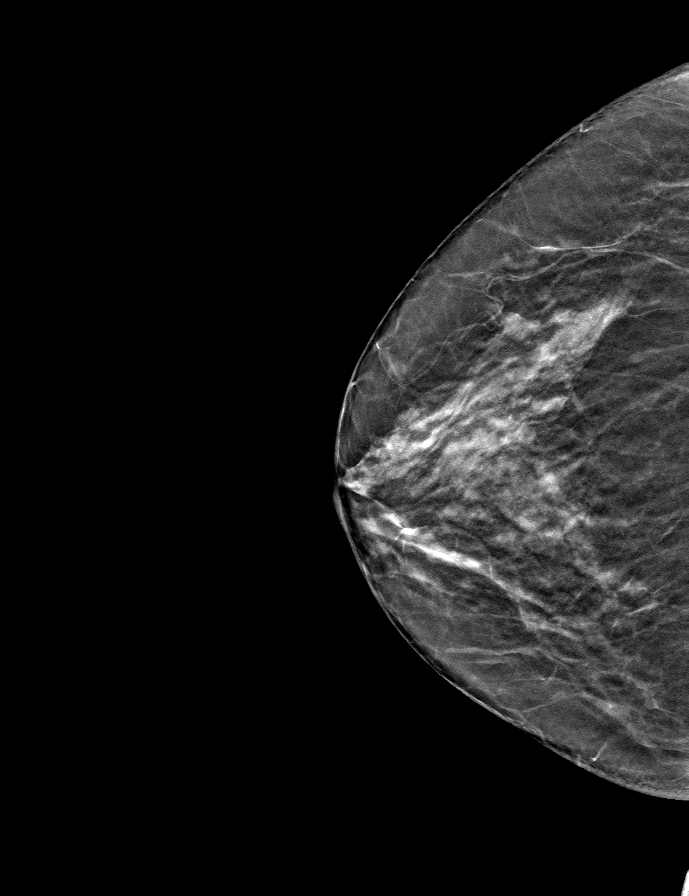
[frame 25/48]
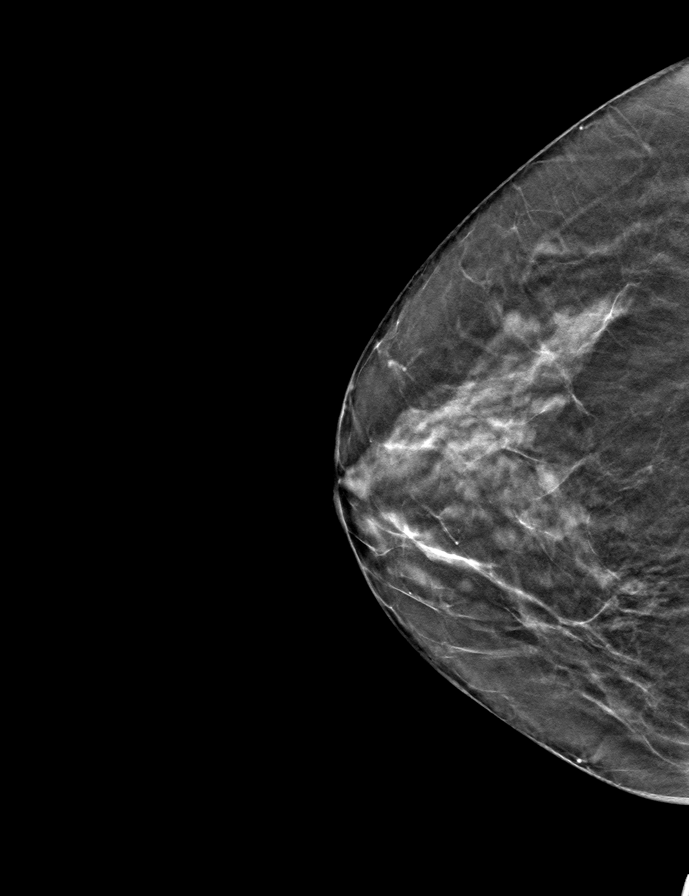

[L MLO tomo · tomo slice 25/50.0]
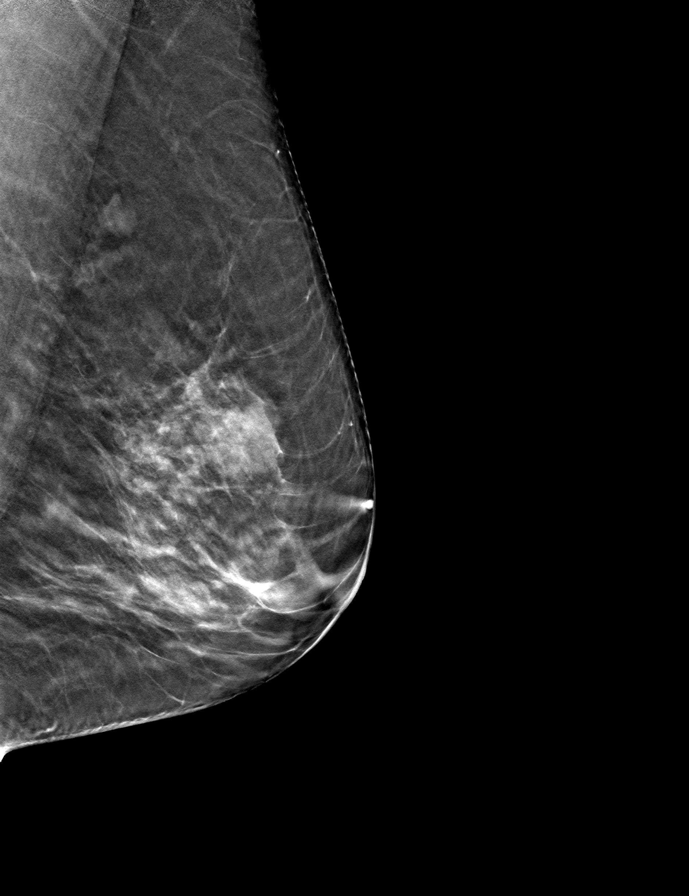

[R MLO tomo · tomo slice 25/50.0]
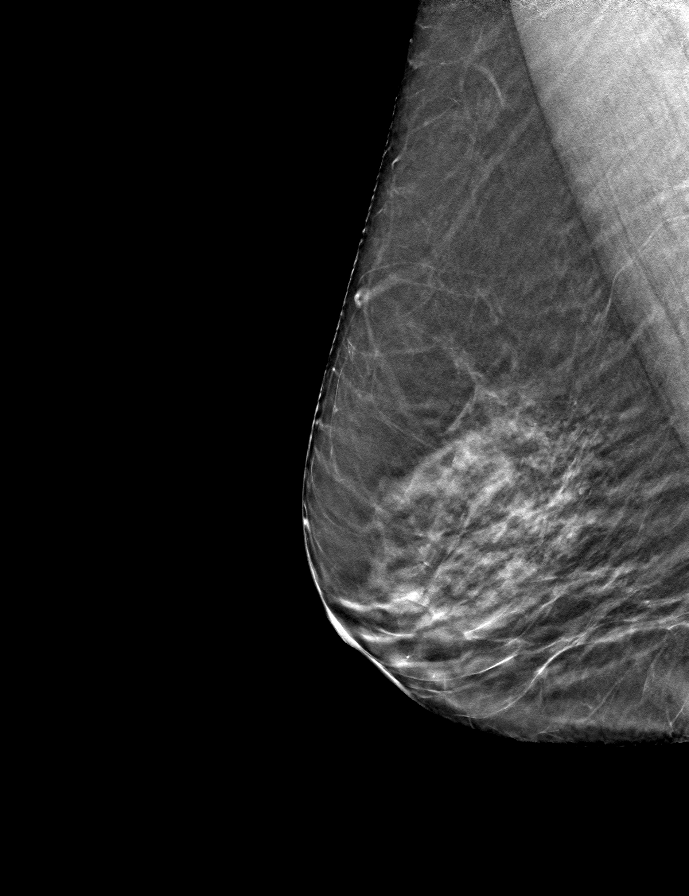

[L CC tomo · tomo slice 25/49.0]
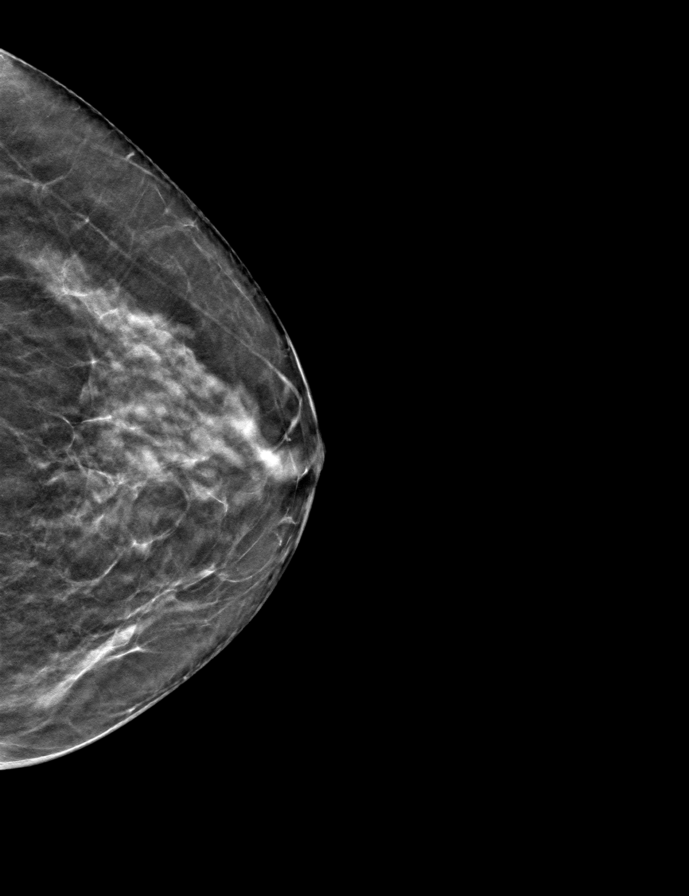

[9 of 24 positions shown; findings below may reference images not displayed]

ACR Breast Density Category b: There are scattered areas of
fibroglandular density.
FINDINGS: 2D/3D full field views of both breasts demonstrate a stable 1 cm
asymmetry within the UPPER LEFT breast.

No new or suspicious findings within either breast noted.

Mammographic images were processed with CAD.
IMPRESSION: 1. Stable 1 cm UPPER LEFT breast asymmetry, previously without
sonographic correlate. 1 year follow-up recommended to ensure 2 year
stability.
2. No new or suspicious mammographic findings within either breast.

RECOMMENDATION:
Bilateral diagnostic mammogram in 1 year.

I have discussed the findings and recommendations with the patient.
If applicable, a reminder letter will be sent to the patient
regarding the next appointment.

BI-RADS CATEGORY  3: Probably benign.

## 2019-05-24 ENCOUNTER — Ambulatory Visit: Payer: Medicare Other | Attending: Internal Medicine

## 2019-05-24 DIAGNOSIS — Z23 Encounter for immunization: Secondary | ICD-10-CM | POA: Insufficient documentation

## 2019-05-24 NOTE — Progress Notes (Signed)
   Covid-19 Vaccination Clinic  Name:  Danielle Garrett    MRN: [id number] DOB: [birth year]  05/24/2019  [name] was observed post Covid-19 immunization for 30 minutes based on pre-vaccination screening without incidence. She was provided with Vaccine Information Sheet and instruction to access the V-Safe system.   [name] was instructed to call 911 with any severe reactions post vaccine: Marland Kitchen Difficulty breathing  . Swelling of your face and throat  . A fast heartbeat  . A bad rash all over your body  . Dizziness and weakness    Immunizations Administered    Name Date Dose VIS Date Route   Pfizer COVID-19 Vaccine 05/24/2019  2:40 PM 0.3 mL 03/28/2019 Intramuscular   Manufacturer: [place]   Lot: [id number]   [place]: [id number]

## 2019-06-07 ENCOUNTER — Ambulatory Visit: Payer: Medicare Other

## 2019-06-18 ENCOUNTER — Ambulatory Visit: Payer: Medicare Other | Attending: Internal Medicine

## 2019-06-18 DIAGNOSIS — Z23 Encounter for immunization: Secondary | ICD-10-CM

## 2019-06-18 NOTE — Progress Notes (Signed)
   Covid-19 Vaccination Clinic  Name:  Danielle Garrett    MRN: [id number] DOB: July 15, 1950  06/18/2019  Ms. [name] was observed post Covid-19 immunization for 15 minutes without incident. She was provided with Vaccine Information Sheet and instruction to access the V-Safe system.   Ms. [name] was instructed to call 911 with any severe reactions post vaccine: Marland Kitchen Difficulty breathing  . Swelling of face and throat  . A fast heartbeat  . A bad rash all over body  . Dizziness and weakness   Immunizations Administered    Name Date Dose VIS Date Route   Pfizer COVID-19 Vaccine 06/18/2019  9:51 AM 0.3 mL 03/28/2019 Intramuscular   Manufacturer: [place]   Lot: [id number]   [place]: [id number]

## 2019-11-24 ENCOUNTER — Emergency Department (HOSPITAL_BASED_OUTPATIENT_CLINIC_OR_DEPARTMENT_OTHER)
Admission: EM | Admit: 2019-11-24 | Discharge: 2019-11-24 | Disposition: A | Discharge status: Home / Self Care | Payer: Medicare Other | Attending: Emergency Medicine | Admitting: Emergency Medicine

## 2019-11-24 ENCOUNTER — Encounter (HOSPITAL_BASED_OUTPATIENT_CLINIC_OR_DEPARTMENT_OTHER): Payer: Self-pay | Admitting: Emergency Medicine

## 2019-11-24 ENCOUNTER — Other Ambulatory Visit: Payer: Self-pay

## 2019-11-24 DIAGNOSIS — Z96641 Presence of right artificial hip joint: Secondary | ICD-10-CM | POA: Insufficient documentation

## 2019-11-24 DIAGNOSIS — Z7982 Long term (current) use of aspirin: Secondary | ICD-10-CM | POA: Diagnosis not present

## 2019-11-24 DIAGNOSIS — J45909 Unspecified asthma, uncomplicated: Secondary | ICD-10-CM | POA: Diagnosis not present

## 2019-11-24 DIAGNOSIS — J029 Acute pharyngitis, unspecified: Secondary | ICD-10-CM | POA: Insufficient documentation

## 2019-11-24 DIAGNOSIS — Z79899 Other long term (current) drug therapy: Secondary | ICD-10-CM | POA: Insufficient documentation

## 2019-11-24 DIAGNOSIS — Z20822 Contact with and (suspected) exposure to covid-19: Secondary | ICD-10-CM | POA: Diagnosis not present

## 2019-11-24 DIAGNOSIS — I10 Essential (primary) hypertension: Secondary | ICD-10-CM | POA: Diagnosis not present

## 2019-11-24 LAB — COMPREHENSIVE METABOLIC PANEL
ALT: 18 U/L (ref 0–44)
AST: 21 U/L (ref 15–41)
Albumin: 4.1 g/dL (ref 3.5–5.0)
Alkaline Phosphatase: 87 U/L (ref 38–126)
Anion gap: 8 (ref 5–15)
BUN: 12 mg/dL (ref 8–23)
CO2: 25 mmol/L (ref 22–32)
Calcium: 9 mg/dL (ref 8.9–10.3)
Chloride: 97 mmol/L — ABNORMAL LOW (ref 98–111)
Creatinine, Ser: 0.75 mg/dL (ref 0.44–1.00)
GFR calc Af Amer: >60 mL/min (ref >60)
GFR calc non Af Amer: >60 mL/min (ref >60)
Glucose, Bld: 95 mg/dL (ref 70–99)
Potassium: 4.6 mmol/L (ref 3.5–5.1)
Sodium: 130 mmol/L — ABNORMAL LOW (ref 135–145)
Total Bilirubin: 0.6 mg/dL (ref 0.3–1.2)
Total Protein: 7.5 g/dL (ref 6.5–8.1)

## 2019-11-24 LAB — CBC WITH DIFFERENTIAL/PLATELET
Abs Immature Granulocytes: 0.01 10*3/uL (ref 0.00–0.07)
Basophils Absolute: 0.1 10*3/uL (ref 0.0–0.1)
Basophils Relative: 1 %
Eosinophils Absolute: 0.1 10*3/uL (ref 0.0–0.5)
Eosinophils Relative: 2 %
HCT: 42.5 % (ref 36.0–46.0)
Hemoglobin: 14.4 g/dL (ref 12.0–15.0)
Immature Granulocytes: 0 %
Lymphocytes Relative: 24 %
Lymphs Abs: 1.2 10*3/uL (ref 0.7–4.0)
MCH: 30.1 pg (ref 26.0–34.0)
MCHC: 33.9 g/dL (ref 30.0–36.0)
MCV: 88.9 fL (ref 80.0–100.0)
Monocytes Absolute: 0.4 10*3/uL (ref 0.1–1.0)
Monocytes Relative: 8 %
Neutro Abs: 3.3 10*3/uL (ref 1.7–7.7)
Neutrophils Relative %: 65 %
Platelets: 249 10*3/uL (ref 150–400)
RBC: 4.78 MIL/uL (ref 3.87–5.11)
RDW: 12.6 % (ref 11.5–15.5)
WBC: 5.1 10*3/uL (ref 4.0–10.5)
nRBC: 0 % (ref 0.0–0.2)

## 2019-11-24 LAB — SARS CORONAVIRUS 2 BY RT PCR (HOSPITAL ORDER, PERFORMED IN ~~LOC~~ HOSPITAL LAB): SARS Coronavirus 2: NEGATIVE

## 2019-11-24 LAB — GROUP A STREP BY PCR: Group A Strep by PCR: NOT DETECTED

## 2019-11-24 MED ORDER — LIDOCAINE VISCOUS HCL 2 % MT SOLN
15.0000 mL | Freq: Once | OROMUCOSAL | Status: AC
  Administered 2019-11-24: 15 mL via OROMUCOSAL
  Filled 2019-11-24: qty 15

## 2019-11-24 NOTE — Discharge Instructions (Signed)
Return to the emergency room tomorrow for your scheduled ultrasound.  Your Covid test today was negative  Continue symptomatic treatment as we talked about

## 2019-11-24 NOTE — ED Provider Notes (Signed)
[facility] EMERGENCY DEPARTMENT Provider Note   CSN: [id number] Arrival date & time: 11/24/19  0940     History Chief Complaint  Patient presents with  . Sore Throat    Danielle Garrett is a 69 y.o. female with past medical history significant for allergic rhinitis, anginal pain, arthritis, childhood asthma, chronic hoarseness, gastroparesis, GERD, hiatal hernia, hypertension, left bundle branch block, pyloric stenosis.  HPI Patient presents to emergency department today with chief complaint of progressively worsening sore throat x4 days.  Patient states she feels like she has a raw scratching sensation when swallowing.  She states the pain is progressively worsened.  Her sister has rheumatoid arthritis and has similar symptoms and is currently in the ICU at [facility] for a viral infection with negative Covid and strep test. Patient has been taking ibuprofen and gurgling salt water with minimal symptom improvement.  She had a fever with T-max of 100.7 yesterday.  She is also noticed that she is a swollen lymph node in her left arm x3 weeks.  It is tender to the touch.  She has no pain with movement of her left arm. Denies any fall or injury of left arm.    Past Medical History:  Diagnosis Date  . Allergic rhinitis due to other allergen   . Anginal pain ([place]) 2011   hospitalized for CP in the past; due to low potassium  . Anxiety   . Arthritis   . Childhood asthma   . Chronic hoarseness   . Gastroparesis   . GERD (gastroesophageal reflux disease)   . History of hiatal hernia   . Hx of blood transfusion reaction    in the early 80s  . Hypertension   . Left bundle branch block     (abnormal heart rhythm)  . PONV (postoperative nausea and vomiting)   . Pyloric stenosis     Patient Active Problem List   Diagnosis Date Noted  . Atrial fibrillation with tachycardic ventricular rate ([place]) 05/29/2016  . Syncope, cardiogenic 05/24/2016  . Primary osteoarthritis of  right hip 06/05/2015  . Pre-operative clearance 06/02/2015  . Chest pain 06/02/2015  . LBBB (left bundle branch block) 06/02/2015  . History of left hip replacement 06/02/2015  . Palpitation 10/07/2014  . Constipation 08/23/2013  . Nausea 08/23/2013    Past Surgical History:  Procedure Laterality Date  . ABDOMINAL HYSTERECTOMY  1983  . CARDIAC CATHETERIZATION  10/2009   Cardiac cath normal coronary arteries  . CERVICAL DISC ARTHROPLASTY     disc replacement in neck 1998  . COLONOSCOPY     10 year repeat 09/26/2010  . ESOPHAGOGASTRODUODENOSCOPY  09/26/2010,01/08/13  . LOOP RECORDER INSERTION N/A 05/29/2016   Procedure: Loop Recorder Insertion;  Surgeon: Evans Lance, MD;  Location: [facility];  Service: Cardiovascular;  Laterality: N/A;  . OTHER SURGICAL HISTORY  1983   hysterectomy ovaries intact, prolapsed   . OTHER SURGICAL HISTORY  2006   pyloric stricture surgery  . TOTAL HIP ARTHROPLASTY Right 06/07/2015   Procedure: TOTAL HIP ARTHROPLASTY ANTERIOR APPROACH;  Surgeon: Frederik Pear, MD;  Location: [facility];  Service: Orthopedics;  Laterality: Right;     OB History   No obstetric history on file.     Family History  Problem Relation Age of Onset  . Hypertension Mother   . Cirrhosis Father   . Hypertension Father   . OCD Sister   . Depression Sister   . Depression Sister   . Hypertension  Sister     Social History   Tobacco Use  . Smoking status: Never Smoker  . Smokeless tobacco: Never Used  Vaping Use  . Vaping Use: Never used  Substance Use Topics  . Alcohol use: No    Alcohol/week: 0.0 standard drinks  . Drug use: No    Home Medications Prior to Admission medications   Medication Sig Start Date End Date Taking? Authorizing Provider  losartan (COZAAR) 100 MG tablet Take 100 mg by mouth daily.   Yes [provider]  rosuvastatin (CRESTOR) 10 MG tablet Take 10 mg by mouth daily.   Yes [provider]  albuterol (PROVENTIL  HFA;VENTOLIN HFA) 108 (90 Base) MCG/ACT inhaler Inhale 1-2 puffs into the lungs every 4 (four) hours as needed for wheezing or shortness of breath.    [provider]  aspirin EC 81 MG tablet Take 81 mg by mouth daily.    [provider]  b complex vitamins tablet Take 1 tablet by mouth daily.    [provider]  FLUoxetine (PROZAC) 10 MG capsule Take 10 mg by mouth daily.    [provider]  fluticasone (FLONASE) 50 MCG/ACT nasal spray Place 2 sprays into both nostrils as needed for allergies or rhinitis.    [provider]  loratadine (CLARITIN) 10 MG tablet Take 10 mg by mouth daily.    [provider]  Multiple Vitamins-Minerals (CENTRUM SILVER 50+WOMEN PO) Take 1 tablet by mouth daily.    [provider]  pantoprazole (PROTONIX) 40 MG tablet Take 40 mg by mouth 2 (two) times daily.    [provider]  sucralfate (CARAFATE) 1 g tablet Take 1 g by mouth 2 (two) times daily as needed (Takes if Pantoprazole is not working).     [provider]  VITAMIN D PO Take 1 tablet by mouth daily.    [provider]    Allergies    Hydrochlorothiazide, Penicillins, Adhesive [tape], Codeine, Sulfa antibiotics, and Vicodin [hydrocodone-acetaminophen]  Review of Systems   Review of Systems  Constitutional: Positive for fever. Negative for appetite change, chills, diaphoresis, fatigue and unexpected weight change.  HENT: Positive for sore throat and voice change. Negative for congestion, ear discharge, ear pain, facial swelling, sinus pressure and sinus pain.   Eyes: Negative for pain and redness.  Respiratory: Negative for cough and shortness of breath.   Cardiovascular: Negative for chest pain.  Gastrointestinal: Negative for abdominal pain, constipation, diarrhea, nausea and vomiting.  Genitourinary: Negative for dysuria and hematuria.  Musculoskeletal: Negative for back pain and neck pain.  Skin: Negative for  wound.  Neurological: Negative for weakness, numbness and headaches.    Physical Exam Updated Vital Signs BP (!) 169/99 (BP Location: Left Arm)   Pulse 77   Temp 97.9 F (36.6 C) (Oral)   Resp 16   Ht 5' (1.524 m)   Wt 54.2 kg   SpO2 99%   BMI 23.34 kg/m   Physical Exam Vitals and nursing note reviewed.  Constitutional:      General: She is not in acute distress.    Appearance: She is not ill-appearing.  HENT:     Head: Normocephalic and atraumatic.     Comments: No sinus or temporal tenderness.     Right Ear: Tympanic membrane, ear canal and external ear normal. Tympanic membrane is not erythematous.     Left Ear: Tympanic membrane, ear canal and external ear normal. Tympanic membrane is not erythematous.  Nose: Nose normal.     Right Nostril: No occlusion.     Left Nostril: No occlusion.     Right Turbinates: Not swollen.     Left Turbinates: Not swollen.     Mouth/Throat:     Mouth: Mucous membranes are moist.     Pharynx: Oropharynx is clear.     Comments: Minor erythema to oropharynx, no edema, no exudate, no tonsillar swelling, voice hoarse, neck supple without lymphadenopathy  Eyes:     General: No scleral icterus.       Right eye: No discharge.        Left eye: No discharge.     Extraocular Movements: Extraocular movements intact.     Conjunctiva/sclera: Conjunctivae normal.     Pupils: Pupils are equal, round, and reactive to light.  Neck:     Vascular: No JVD.  Cardiovascular:     Rate and Rhythm: Normal rate and regular rhythm.     Pulses: Normal pulses.          Radial pulses are 2+ on the right side and 2+ on the left side.     Heart sounds: Normal heart sounds.  Pulmonary:     Comments: Lungs clear to auscultation in all fields. Symmetric chest rise. No wheezing, rales, or rhonchi. Abdominal:     Comments: Abdomen is soft, non-distended, and non-tender in all quadrants. No rigidity, no guarding. No peritoneal signs.  Musculoskeletal:         General: Normal range of motion.     Cervical back: Normal range of motion.  Skin:    General: Skin is warm and dry.     Capillary Refill: Capillary refill takes less than 2 seconds.  Neurological:     Mental Status: She is oriented to person, place, and time.     GCS: GCS eye subscore is 4. GCS verbal subscore is 5. GCS motor subscore is 6.     Comments: Fluent speech, no facial droop.  Psychiatric:        Behavior: Behavior normal.     ED Results / Procedures / Treatments   Labs (all labs ordered are listed, but only abnormal results are displayed) Labs Reviewed  COMPREHENSIVE METABOLIC PANEL - Abnormal; Notable for the following components:      Result Value   Sodium 130 (*)    Chloride 97 (*)    All other components within normal limits  GROUP A STREP BY PCR  SARS CORONAVIRUS 2 BY RT PCR (HOSPITAL ORDER, [facility])  CBC WITH DIFFERENTIAL/PLATELET    EKG None  Radiology No results found.  Procedures Procedures (including critical care time)  Medications Ordered in ED Medications  lidocaine (XYLOCAINE) 2 % viscous mouth solution 15 mL (15 mLs Mouth/Throat Given 11/24/19 1247)    ED Course  I have reviewed the triage vital signs and the nursing notes.  Pertinent labs & imaging results that were available during my care of the patient were reviewed by me and considered in my medical decision making (see chart for details).  Clinical Course as of Nov 23 1504  Mon Nov 24, 6430  828 69 year old female here with sore throat for 4 days, laryngitis, and a tender lump on her left antecubital area that is been there for 4 weeks.  Sick contact with sister who was in the ICU with similar throat condition.  Sounds like they do not know what it is from.  Strep is negative here.  Getting  other labs.  Likely discharge with symptomatic treatment   [MB]    Clinical Course User Index [MB] Hayden Rasmussen, MD   MDM Rules/Calculators/are A&P                           History provided by patient with additional history obtained from chart review.    69 yo female presenting with sore throat x 2 days with hoarseness she typically has with viral illnesses. Patient presents awake, alert, hemodynamically stable, afebrile, non toxic. She is sitting comfortably on examination table. On exam she has mild erythema to posterior oropharynx. Presentation not concerning for PTA or Ludwig's angina, Uvulitis, epiglottitis, peritonsillar abscess, or retropharyngeal abscess. Strep test collected in triage is negative. Vital signs reviewed and stable. Still able to tolerate PO/secretions but with worsening pain.  She has tenderness to palpation of bicep.  No palpable cord or swelling noted.  At this time we do not have ultrasound in the emergency department.  Will have patient come back tomorrow for schedule ultrasound to rule out DVT although that is felt to be very unlikely at this time.  Labs collected. CBC and CMP unremarkable. Covid test and strep test is negative. Discussed results with patient. Encouraged at home supportive care measures. Pt does not appear dehydrated, but did discuss importance of water rehydration. Patient had ample opportunity for questions and discussion. All patient's questions were answered with full understanding. Patient expresses understanding and agreement to plan. Patient successfully fluid challenged in the ED without difficulty swallowing.  Strict return precautions given. NAD. VSS. Recommended PCP follow up for re-evaluation.  The patient was discussed with and seen by Dr. Melina Copa who agrees with the treatment plan.   Portions of this note were generated with [employer]. Dictation errors may occur despite best attempts at proofreading.  Final Clinical Impression(s) / ED Diagnoses Final diagnoses:  Pharyngitis, unspecified etiology    Rx / DC Orders ED Discharge Orders         Ordered    [place] Extrem Up Left Ltd   Status:  Canceled        11/24/19 1244    [place] LT UPPER EXTREM LTD SOFT TISSUE NON VASCULAR     Discontinue     11/24/19 1244           Cherre Robins, PA-C 11/24/19 1506    Hayden Rasmussen, MD 11/24/19 2134

## 2019-11-24 NOTE — ED Triage Notes (Signed)
Sore throat x 4 days. Her sister is currently hospitalized with a viral infection due to a sore throat. Also reports a swollen lymph node in L arm.

## 2019-11-25 ENCOUNTER — Ambulatory Visit (HOSPITAL_BASED_OUTPATIENT_CLINIC_OR_DEPARTMENT_OTHER)
Admission: RE | Admit: 2019-11-25 | Discharge: 2019-11-25 | Disposition: A | Discharge status: Home / Self Care | Payer: Medicare Other | Source: Ambulatory Visit | Attending: Physician Assistant | Admitting: Physician Assistant

## 2019-11-25 DIAGNOSIS — M7989 Other specified soft tissue disorders: Secondary | ICD-10-CM | POA: Insufficient documentation

## 2019-11-25 DIAGNOSIS — M79602 Pain in left arm: Secondary | ICD-10-CM | POA: Diagnosis present

## 2019-11-25 IMAGING — US US EXTREM UP*L* LTD
1 series · 14 of 17 positions shown · non-contrast
Comparison: None.

CLINICAL DATA: Pain and swelling for 4 weeks.

EXAM:
ULTRASOUND LEFT UPPER EXTREMITY LIMITED
TECHNIQUE: Ultrasound examination of the upper extremity soft tissues was
performed in the area of clinical concern.

[Series 1: us extrem up*left* ltd · 17 acquisitions, 14 frames shown]
[im 1/17]
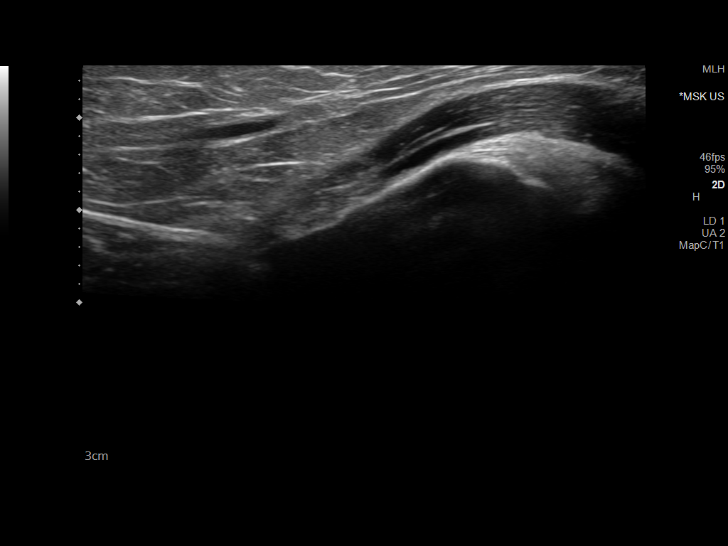
[im 2/17]
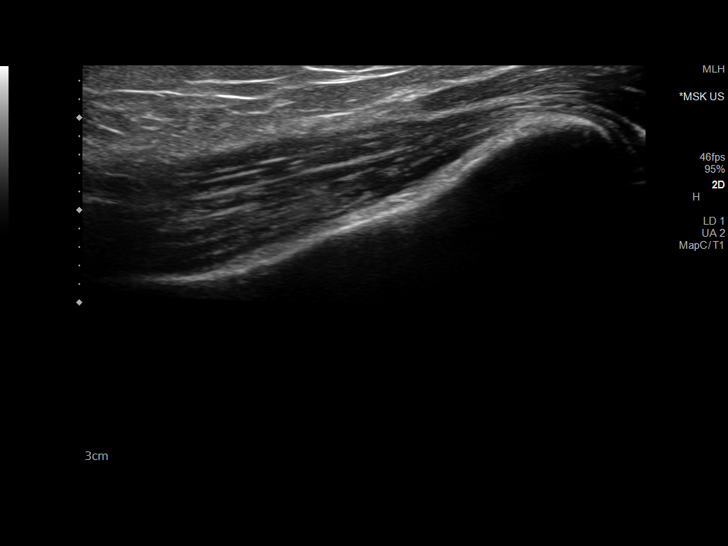
[im 4/17]
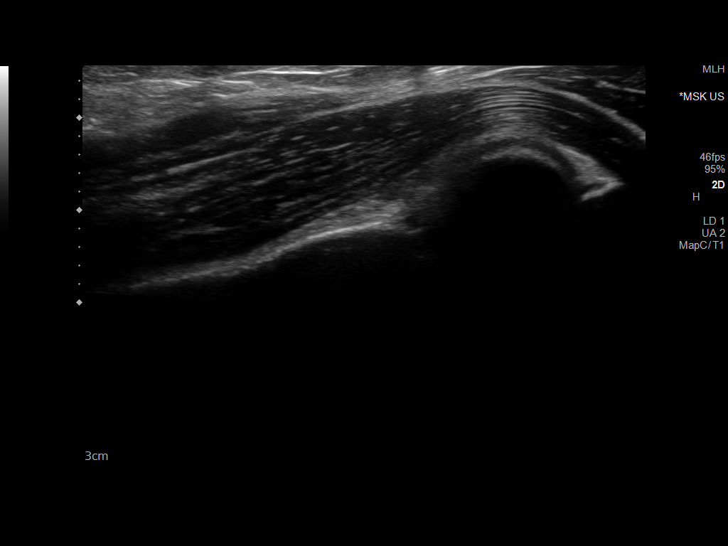
[im 5/17]
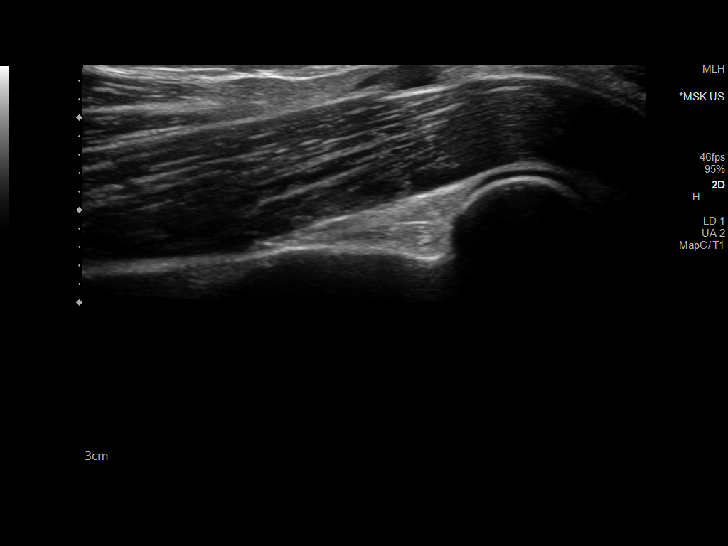
[im 6/17]
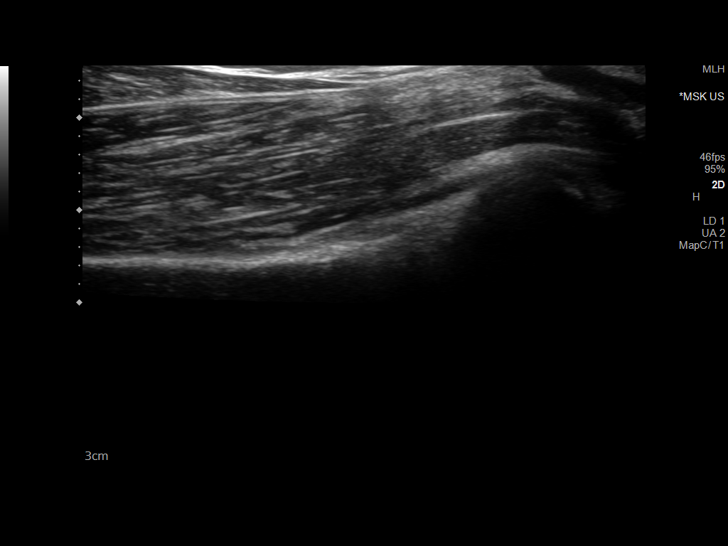
[im 7/17]
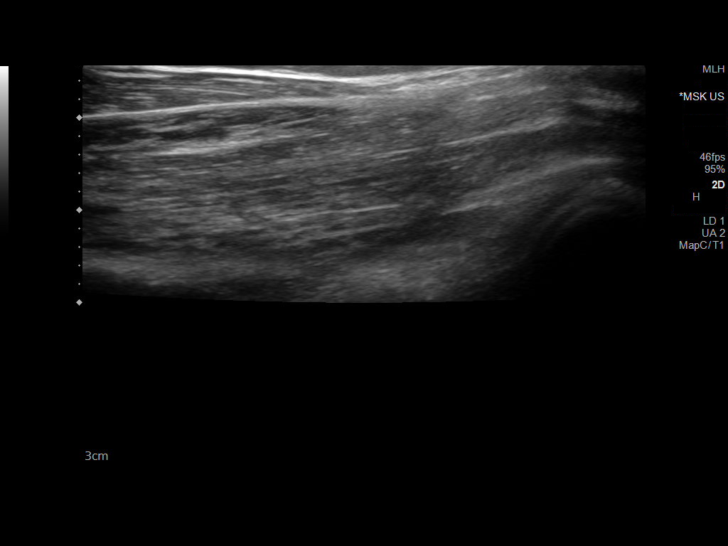
[im 8/17]
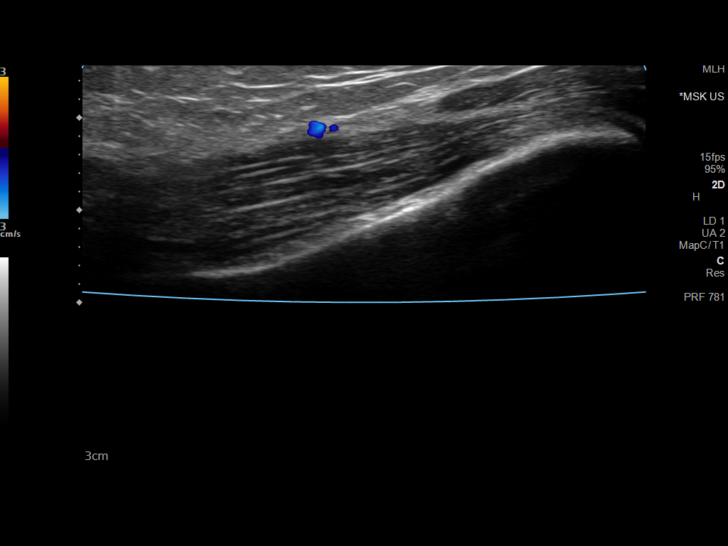
[im 10/17]
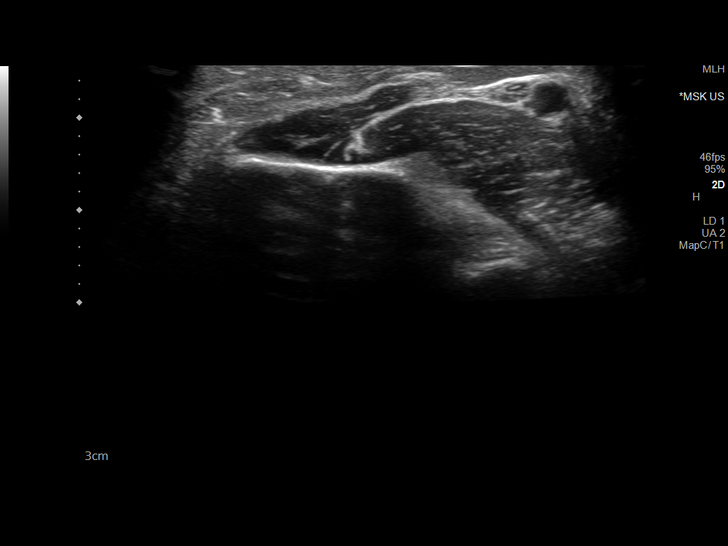
[im 11/17]
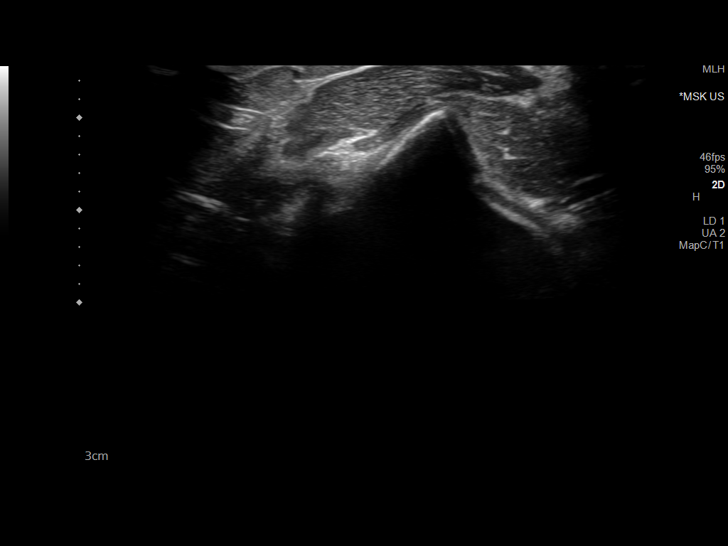
[im 12/17]
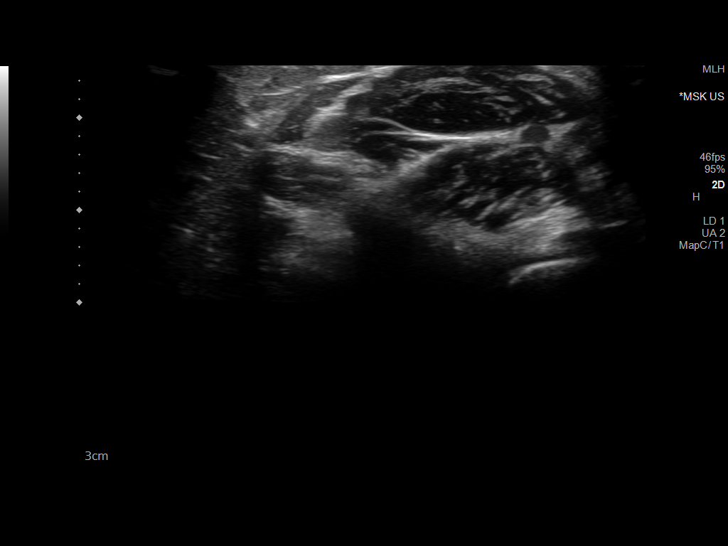
[im 13/17]
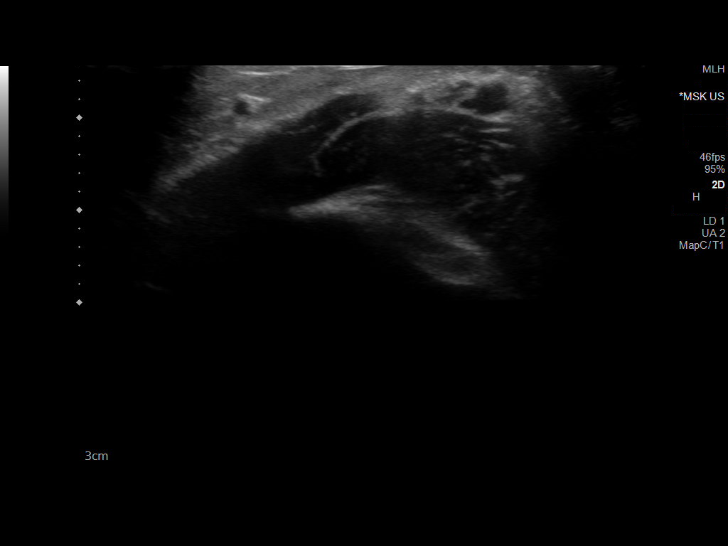
[im 14/17]
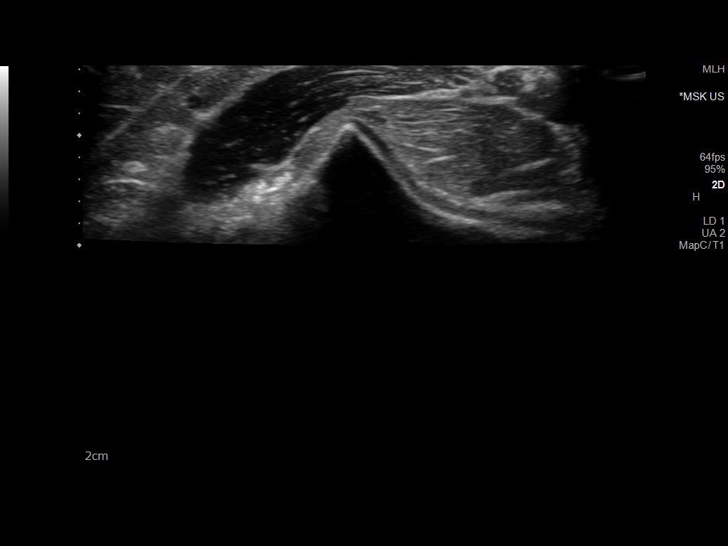
[im 16/17]
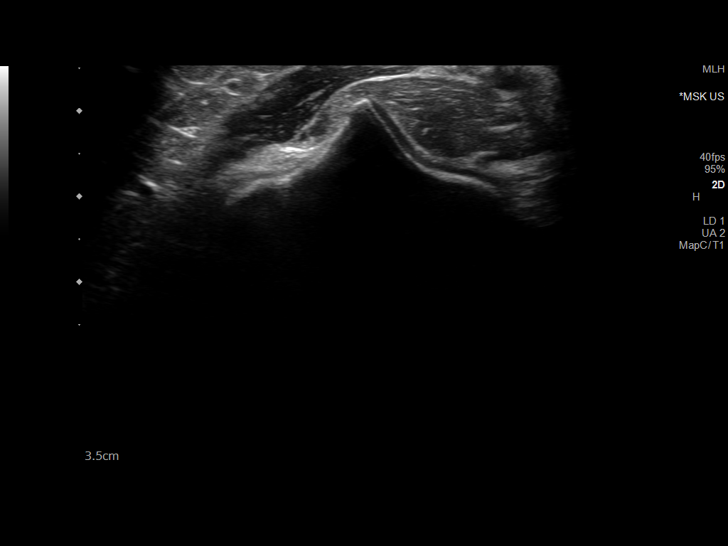
[im 17/17]
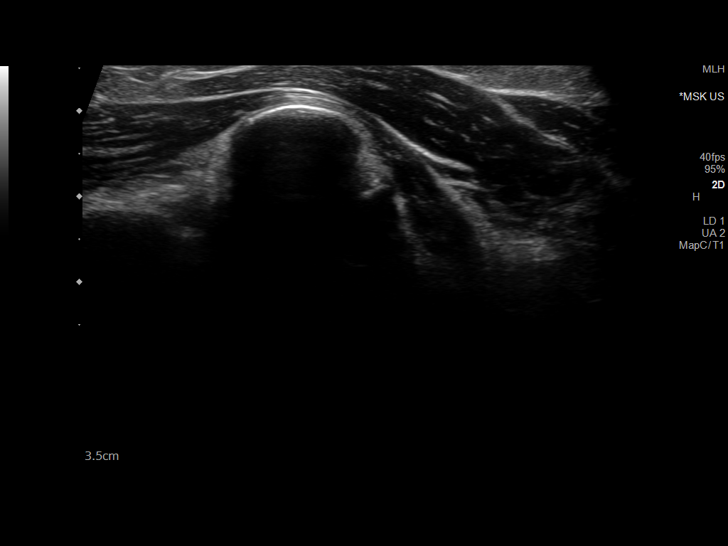

[14 of 17 positions shown; findings below may reference images not displayed]

FINDINGS: Real-time sonography of the left hand cubital fossa was performed
with a high-frequency linear transducer. No solid or cystic mass. No
fluid collection or hematoma.

Focal bony prominence along the anterior aspect of distal humerus
likely reflecting a supracondylar process of the humerus (avian
spur).
IMPRESSION: 1. No focal soft tissue abnormality at the site of clinical concern
in the region of the left antecubital fossa.
2. Focal bony prominence along the anterior aspect of distal humerus
likely reflecting a supracondylar process of the humerus (avian
spur). Recommend correlation with an x-ray of the left elbow.

## 2019-12-11 ENCOUNTER — Other Ambulatory Visit: Payer: Self-pay | Admitting: Family Medicine

## 2019-12-11 ENCOUNTER — Ambulatory Visit
Admission: RE | Admit: 2019-12-11 | Discharge: 2019-12-11 | Disposition: A | Discharge status: Home / Self Care | Payer: Medicare Other | Source: Ambulatory Visit | Attending: Family Medicine | Admitting: Family Medicine

## 2019-12-11 DIAGNOSIS — M7989 Other specified soft tissue disorders: Secondary | ICD-10-CM | POA: Diagnosis not present

## 2019-12-11 IMAGING — CR DG HUMERUS 2V *L*
2 series · 2 of 2 positions shown · non-contrast
Comparison: None.

CLINICAL DATA: Left arm swelling

EXAM:
LEFT HUMERUS - 2+ VIEW

[w humerus ap left *]
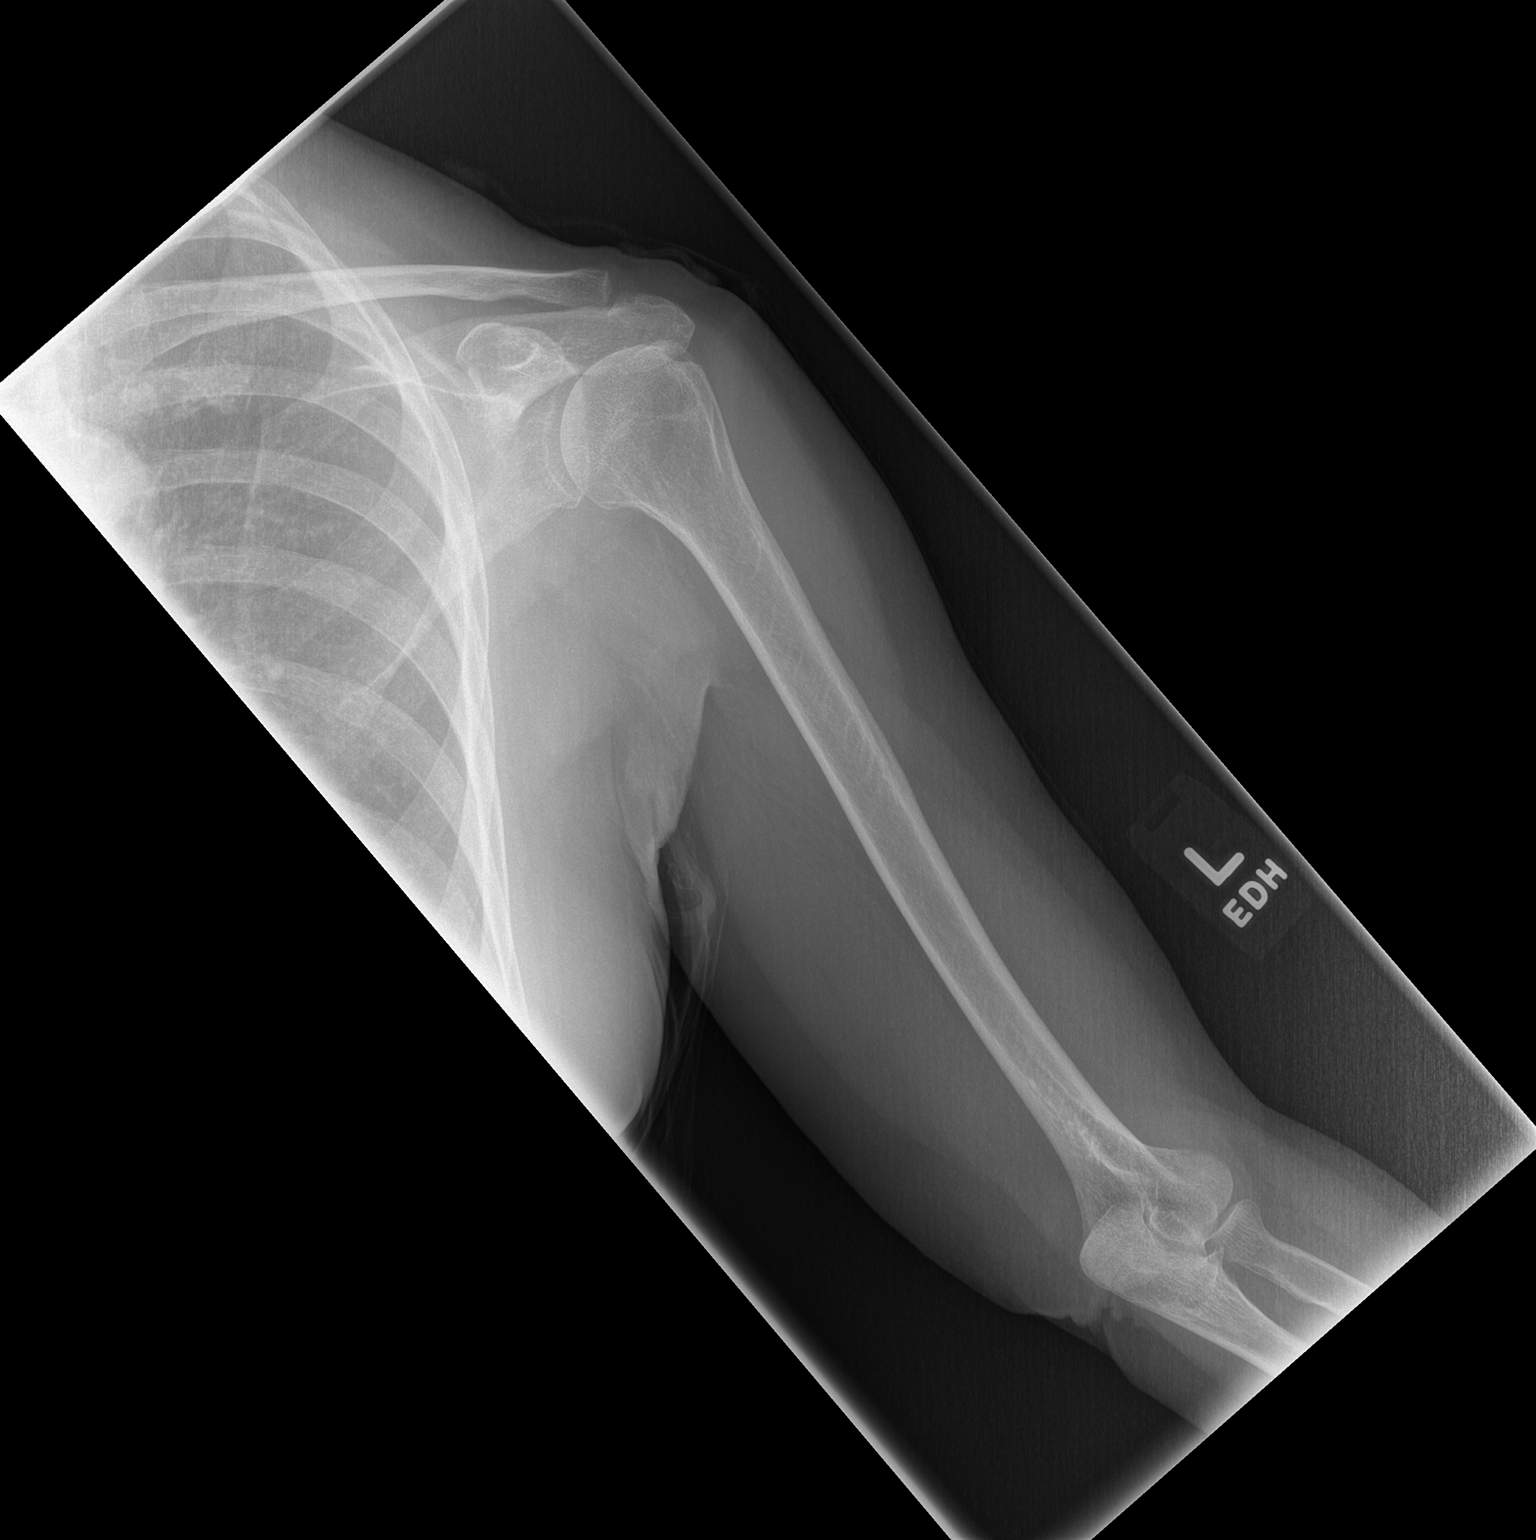

[w humerus lat left *]
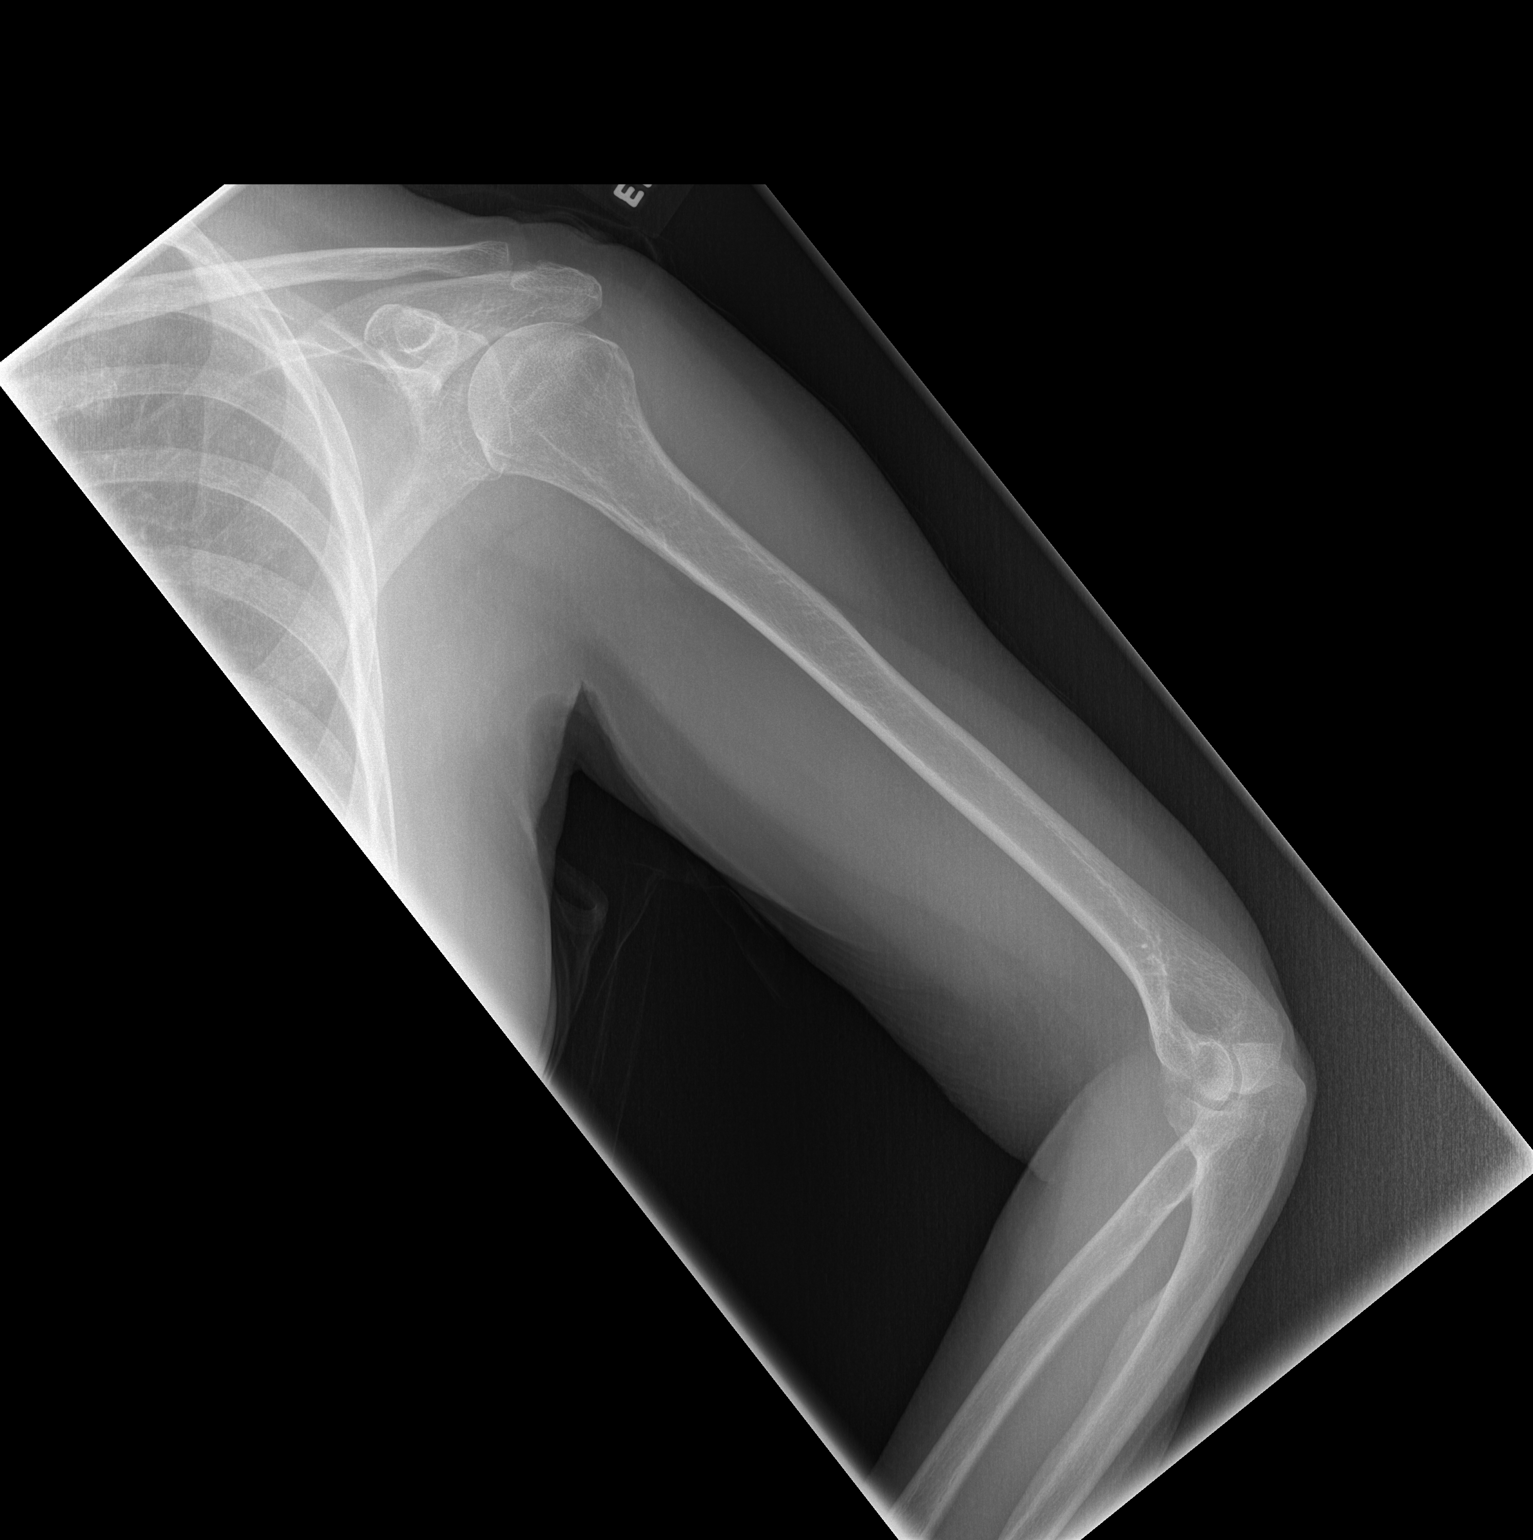

[2 of 2 positions shown; findings below may reference images not displayed]

FINDINGS: Two view radiograph left humerus demonstrates normal alignment. No
fracture or dislocation. No destructive osseous lesion. Soft tissues
are unremarkable.
IMPRESSION: Negative.

## 2020-04-20 DIAGNOSIS — G43B1 Ophthalmoplegic migraine, intractable: Secondary | ICD-10-CM | POA: Diagnosis not present

## 2020-04-20 DIAGNOSIS — H5213 Myopia, bilateral: Secondary | ICD-10-CM | POA: Diagnosis not present

## 2020-04-20 DIAGNOSIS — H524 Presbyopia: Secondary | ICD-10-CM | POA: Diagnosis not present

## 2020-04-23 DIAGNOSIS — R030 Elevated blood-pressure reading, without diagnosis of hypertension: Secondary | ICD-10-CM | POA: Diagnosis not present

## 2020-04-23 DIAGNOSIS — J019 Acute sinusitis, unspecified: Secondary | ICD-10-CM | POA: Diagnosis not present

## 2020-05-11 DIAGNOSIS — H6503 Acute serous otitis media, bilateral: Secondary | ICD-10-CM | POA: Diagnosis not present

## 2020-05-11 DIAGNOSIS — J0100 Acute maxillary sinusitis, unspecified: Secondary | ICD-10-CM | POA: Diagnosis not present

## 2020-05-15 DIAGNOSIS — Z20822 Contact with and (suspected) exposure to covid-19: Secondary | ICD-10-CM | POA: Diagnosis not present

## 2020-05-15 DIAGNOSIS — R0981 Nasal congestion: Secondary | ICD-10-CM | POA: Diagnosis not present

## 2020-05-15 DIAGNOSIS — M791 Myalgia, unspecified site: Secondary | ICD-10-CM | POA: Diagnosis not present

## 2020-05-15 DIAGNOSIS — R051 Acute cough: Secondary | ICD-10-CM | POA: Diagnosis not present

## 2020-05-15 DIAGNOSIS — R5381 Other malaise: Secondary | ICD-10-CM | POA: Diagnosis not present

## 2020-05-15 DIAGNOSIS — U071 COVID-19: Secondary | ICD-10-CM | POA: Diagnosis not present

## 2020-05-17 DIAGNOSIS — J4520 Mild intermittent asthma, uncomplicated: Secondary | ICD-10-CM | POA: Diagnosis not present

## 2020-05-17 DIAGNOSIS — U071 COVID-19: Secondary | ICD-10-CM | POA: Diagnosis not present

## 2020-05-31 DIAGNOSIS — J329 Chronic sinusitis, unspecified: Secondary | ICD-10-CM | POA: Diagnosis not present

## 2020-05-31 DIAGNOSIS — R030 Elevated blood-pressure reading, without diagnosis of hypertension: Secondary | ICD-10-CM | POA: Diagnosis not present

## 2020-07-05 ENCOUNTER — Encounter (HOSPITAL_BASED_OUTPATIENT_CLINIC_OR_DEPARTMENT_OTHER): Payer: Self-pay | Admitting: Emergency Medicine

## 2020-07-05 DIAGNOSIS — I10 Essential (primary) hypertension: Secondary | ICD-10-CM | POA: Diagnosis not present

## 2020-07-05 DIAGNOSIS — K219 Gastro-esophageal reflux disease without esophagitis: Secondary | ICD-10-CM | POA: Diagnosis not present

## 2020-07-05 DIAGNOSIS — F4321 Adjustment disorder with depressed mood: Secondary | ICD-10-CM | POA: Diagnosis not present

## 2020-07-05 DIAGNOSIS — E785 Hyperlipidemia, unspecified: Secondary | ICD-10-CM | POA: Diagnosis not present

## 2020-07-05 DIAGNOSIS — J4520 Mild intermittent asthma, uncomplicated: Secondary | ICD-10-CM | POA: Diagnosis not present

## 2020-07-14 DIAGNOSIS — J3489 Other specified disorders of nose and nasal sinuses: Secondary | ICD-10-CM | POA: Diagnosis not present

## 2020-07-14 DIAGNOSIS — J0130 Acute sphenoidal sinusitis, unspecified: Secondary | ICD-10-CM | POA: Diagnosis not present

## 2020-07-14 DIAGNOSIS — J342 Deviated nasal septum: Secondary | ICD-10-CM | POA: Diagnosis not present

## 2020-07-14 DIAGNOSIS — J329 Chronic sinusitis, unspecified: Secondary | ICD-10-CM | POA: Diagnosis not present

## 2020-07-14 DIAGNOSIS — J323 Chronic sphenoidal sinusitis: Secondary | ICD-10-CM | POA: Diagnosis not present

## 2020-07-28 DIAGNOSIS — J329 Chronic sinusitis, unspecified: Secondary | ICD-10-CM | POA: Diagnosis not present

## 2020-07-28 DIAGNOSIS — R220 Localized swelling, mass and lump, head: Secondary | ICD-10-CM | POA: Diagnosis not present

## 2020-08-13 DIAGNOSIS — J4520 Mild intermittent asthma, uncomplicated: Secondary | ICD-10-CM | POA: Diagnosis not present

## 2020-08-13 DIAGNOSIS — I10 Essential (primary) hypertension: Secondary | ICD-10-CM | POA: Diagnosis not present

## 2020-08-13 DIAGNOSIS — E785 Hyperlipidemia, unspecified: Secondary | ICD-10-CM | POA: Diagnosis not present

## 2020-08-13 DIAGNOSIS — F4321 Adjustment disorder with depressed mood: Secondary | ICD-10-CM | POA: Diagnosis not present

## 2020-08-13 DIAGNOSIS — K219 Gastro-esophageal reflux disease without esophagitis: Secondary | ICD-10-CM | POA: Diagnosis not present

## 2020-08-17 DIAGNOSIS — J323 Chronic sphenoidal sinusitis: Secondary | ICD-10-CM | POA: Diagnosis not present

## 2020-08-24 ENCOUNTER — Ambulatory Visit: Payer: Medicare Other | Admitting: Allergy and Immunology

## 2020-09-20 DIAGNOSIS — E785 Hyperlipidemia, unspecified: Secondary | ICD-10-CM | POA: Diagnosis not present

## 2020-09-20 DIAGNOSIS — J4520 Mild intermittent asthma, uncomplicated: Secondary | ICD-10-CM | POA: Diagnosis not present

## 2020-09-20 DIAGNOSIS — K219 Gastro-esophageal reflux disease without esophagitis: Secondary | ICD-10-CM | POA: Diagnosis not present

## 2020-09-20 DIAGNOSIS — I10 Essential (primary) hypertension: Secondary | ICD-10-CM | POA: Diagnosis not present

## 2020-09-20 DIAGNOSIS — F4321 Adjustment disorder with depressed mood: Secondary | ICD-10-CM | POA: Diagnosis not present

## 2020-09-23 DIAGNOSIS — R202 Paresthesia of skin: Secondary | ICD-10-CM | POA: Diagnosis not present

## 2020-09-23 DIAGNOSIS — I10 Essential (primary) hypertension: Secondary | ICD-10-CM | POA: Diagnosis not present

## 2020-10-01 ENCOUNTER — Other Ambulatory Visit: Payer: Self-pay | Admitting: Family Medicine

## 2020-10-01 DIAGNOSIS — Z86010 Personal history of colonic polyps: Secondary | ICD-10-CM | POA: Diagnosis not present

## 2020-10-01 DIAGNOSIS — R202 Paresthesia of skin: Secondary | ICD-10-CM

## 2020-10-11 DIAGNOSIS — R0982 Postnasal drip: Secondary | ICD-10-CM | POA: Diagnosis not present

## 2020-10-16 ENCOUNTER — Ambulatory Visit
Admission: RE | Admit: 2020-10-16 | Discharge: 2020-10-16 | Disposition: A | Discharge status: Home / Self Care | Payer: Medicare Other | Source: Ambulatory Visit | Attending: Family Medicine | Admitting: Family Medicine

## 2020-10-16 DIAGNOSIS — R200 Anesthesia of skin: Secondary | ICD-10-CM | POA: Diagnosis not present

## 2020-10-16 DIAGNOSIS — R202 Paresthesia of skin: Secondary | ICD-10-CM

## 2020-10-16 IMAGING — MR MR MRA HEAD W/O CM
1 of 2 series · 10 of 48 positions shown · IV contrast (multihance)
Comparison: Head CT [DATE]

CLINICAL DATA: Facial tingling and left arm tingling. Question
stroke.

EXAM:
MRI HEAD WITHOUT  CONTRAST
MRA HEAD WITHOUT CONTRAST
MRA NECK WITHOUT AND WITH CONTRAST
TECHNIQUE: Multiplanar, multiecho pulse sequences of the brain and surrounding
structures were obtained without and with intravenous contrast.
Angiographic images of the Circle of Willis were obtained using MRA
technique without intravenous contrast. Angiographic images of the
neck were obtained using MRA technique without and with intravenous
contrast. Carotid stenosis measurements (when applicable) are
obtained utilizing NASCET criteria, using the distal internal
carotid diameter as the denominator.
CONTRAST:  10mL MULTIHANCE GADOBENATE DIMEGLUMINE 529 MG/ML IV SOLN

[Series 5: [id number]_multi-slab · axial · 0.6mm · 0.26mm/px · z∈[-45,+42]mm · 10 of 162 slices shown]
[im 8/162]
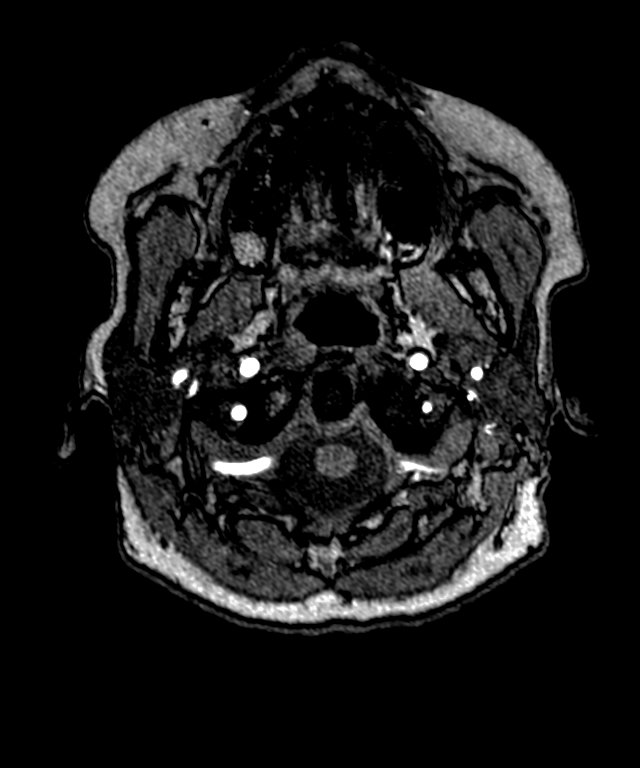
[im 28/162]
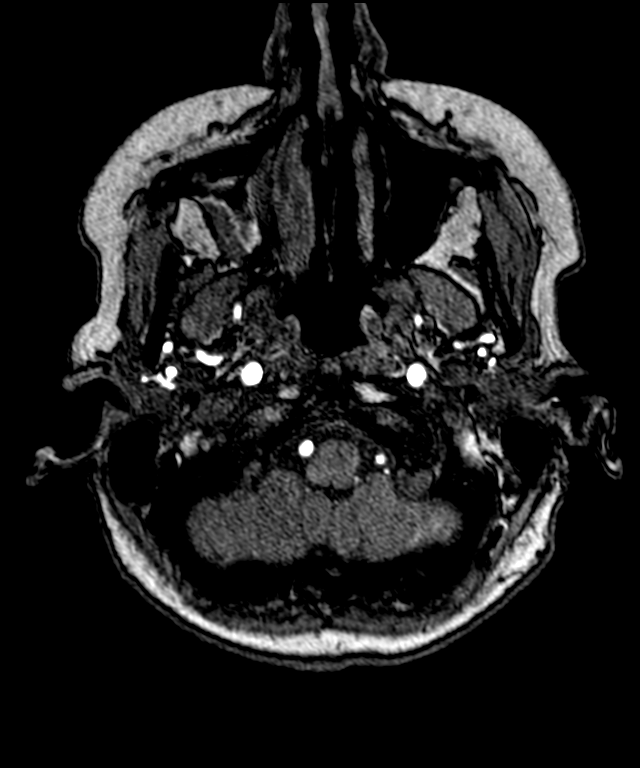
[im 52/162]
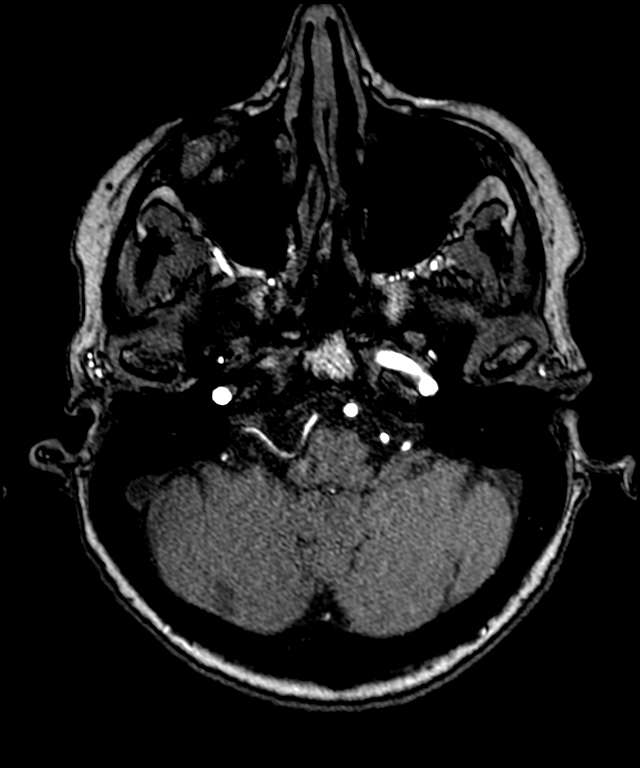
[im 71/162]
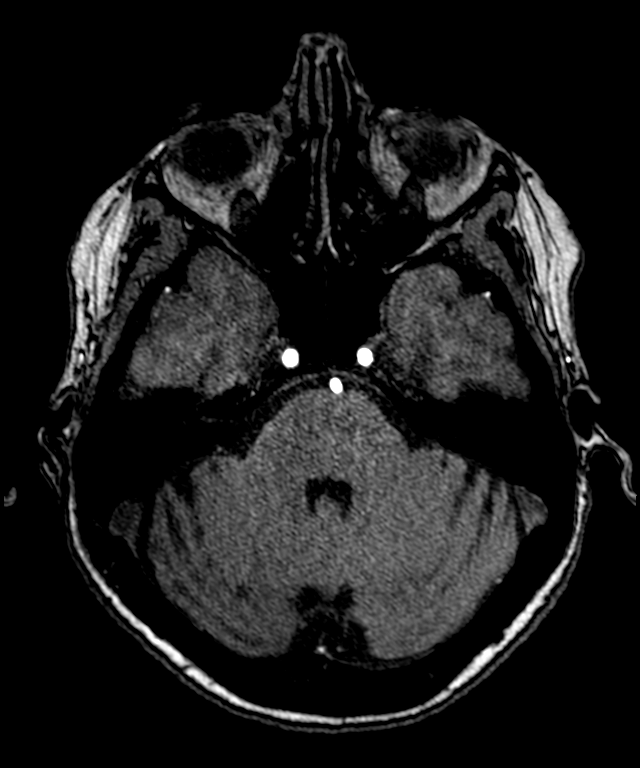
[im 83/162]
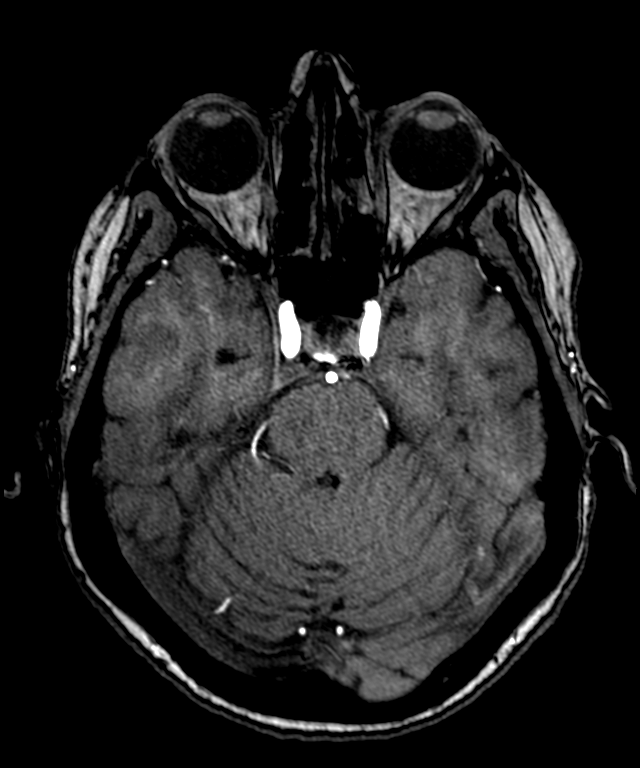
[im 91/162]
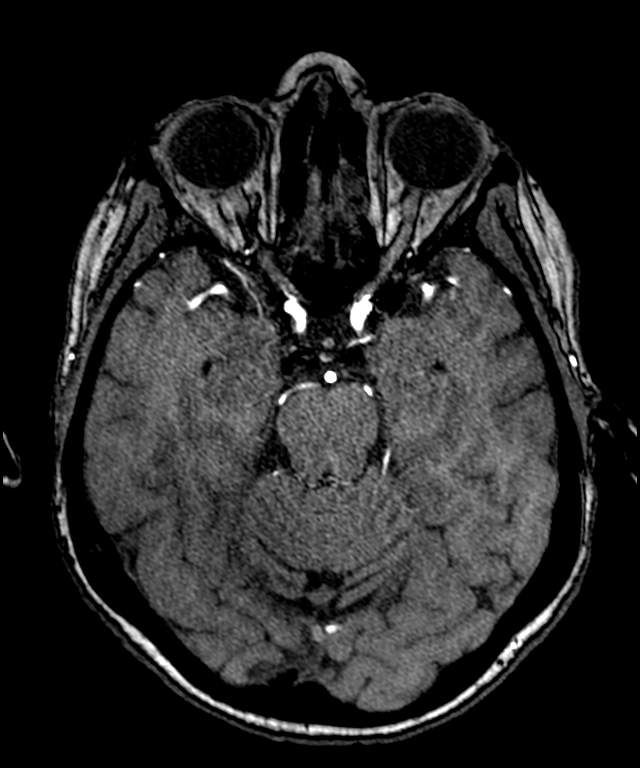
[im 110/162]
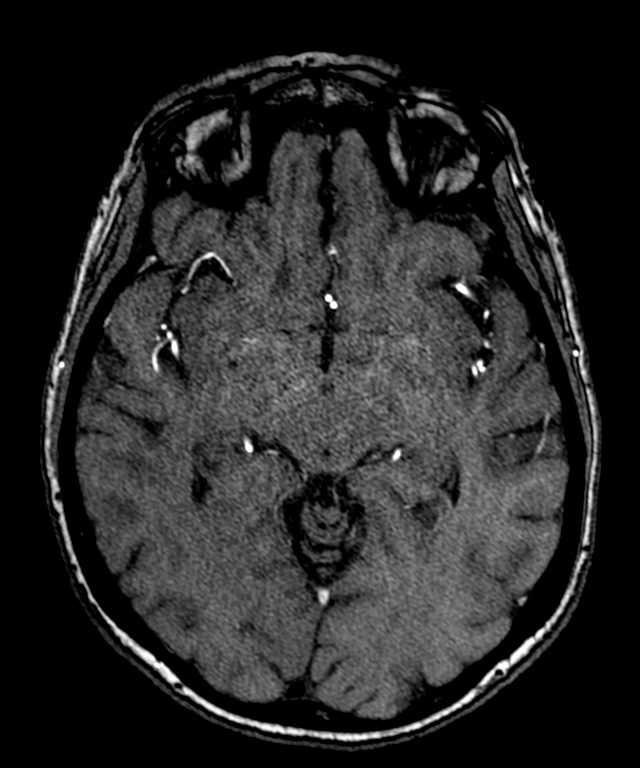
[im 134/162]
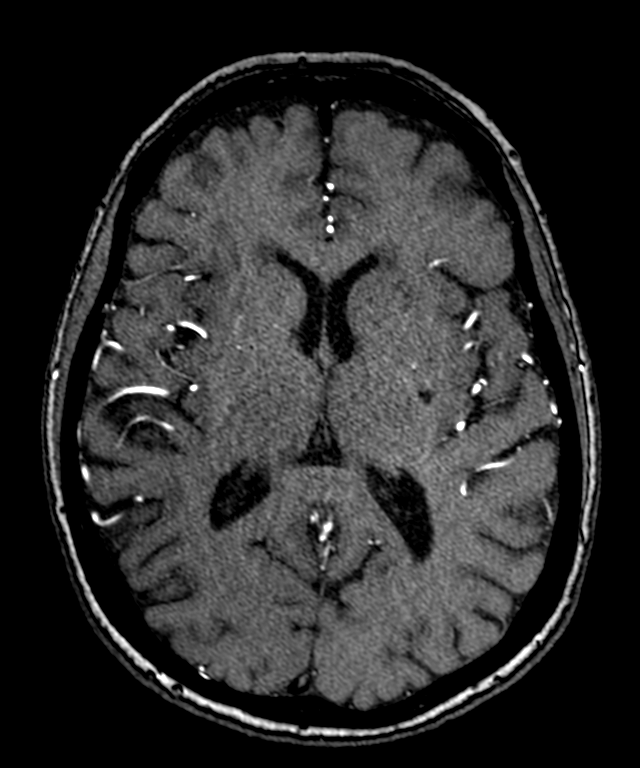
[im 138/162]
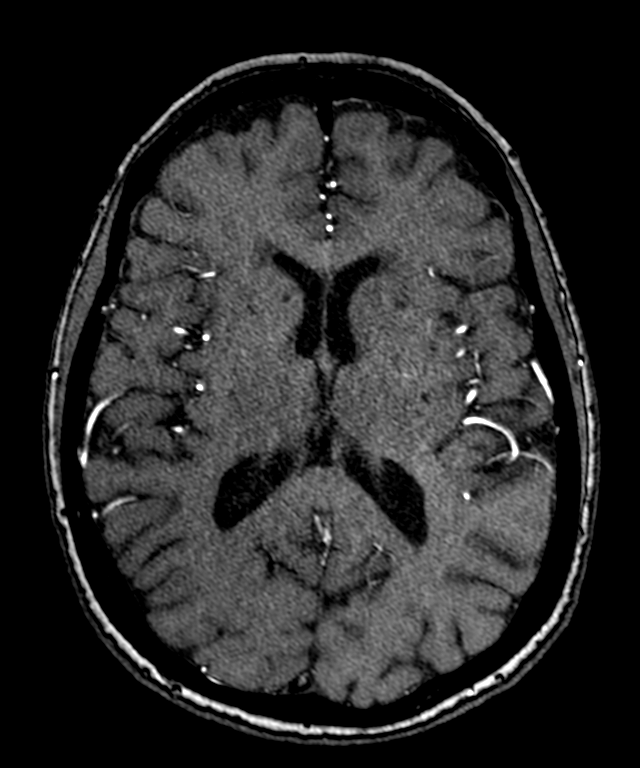
[im 154/162]
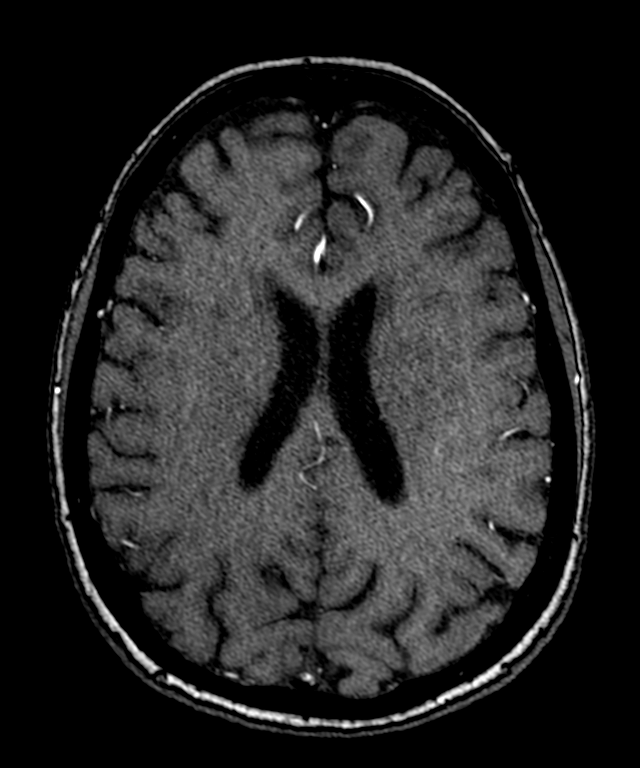

[10 of 48 positions shown; findings below may reference images not displayed]

FINDINGS: MRI HEAD FINDINGS

Brain: Diffusion imaging does not show any acute or subacute
infarction. There chronic small-vessel ischemic changes affecting
the pons. No focal cerebellar finding. Cerebral hemispheres show
moderate chronic small-vessel ischemic changes of the deep and
subcortical white matter, often seen at this age. No cortical or
large vessel territory infarction. No mass lesion, hemorrhage,
hydrocephalus or extra-axial collection.

Vascular: Major vessels at the base of the brain show flow.

Skull and upper cervical spine: Negative

Sinuses/Orbits: Clear except for mucosal thickening of the right
maxillary sinus floor. Orbits negative.

Other: None

MRA HEAD FINDINGS

Both internal carotid arteries are widely patent through the skull
base and siphon regions. The anterior and middle cerebral vessels
are patent without proximal stenosis, aneurysm or vascular
malformation. Fetal origin right PCA.

Both vertebral arteries are patent through the foramen magnum. Right
vertebral artery largely terminates in a large and somewhat
anomalous PICA. Small contribution to the basilar. Normal appearing
superior cerebellar artery flow and posterior cerebral artery flow.

MRA NECK FINDINGS

Branching pattern from the arch is normal. Both common carotid
arteries are widely patent to the bifurcation regions. Both carotid
bifurcations are normal. No stenosis or irregularity. Both cervical
internal carotid arteries are normal.

Both vertebral arteries appear widely patent at their origins and
through the cervical region to foramen magnum.
IMPRESSION: Moderate chronic small-vessel ischemic changes of the pons and
cerebral hemispheric white matter. No acute or subacute insult.

Negative MR angiography of the neck vessels.

No evidence of acquired large or medium vessel intracranial vascular
abnormality. Some posterior circulation congenital variations, not
of any clinical significance.

## 2020-10-16 IMAGING — MR MR HEAD W/O CM
11 series · 48 of 48 positions shown · IV contrast (multihance)
Comparison: Head CT [DATE]

CLINICAL DATA: Facial tingling and left arm tingling. Question
stroke.

EXAM:
MRI HEAD WITHOUT  CONTRAST
MRA HEAD WITHOUT CONTRAST
MRA NECK WITHOUT AND WITH CONTRAST
TECHNIQUE: Multiplanar, multiecho pulse sequences of the brain and surrounding
structures were obtained without and with intravenous contrast.
Angiographic images of the Circle of Willis were obtained using MRA
technique without intravenous contrast. Angiographic images of the
neck were obtained using MRA technique without and with intravenous
contrast. Carotid stenosis measurements (when applicable) are
obtained utilizing NASCET criteria, using the distal internal
carotid diameter as the denominator.
CONTRAST:  10mL MULTIHANCE GADOBENATE DIMEGLUMINE 529 MG/ML IV SOLN

[Series 5: T1 · sagittal · 4.0mm · 0.94mm/px · 2 of 30 slices shown (1 of 2)]
[im 1/30]
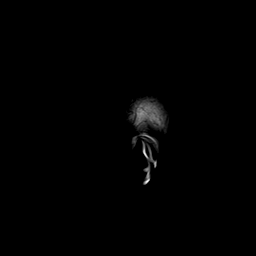
[im 30/30]
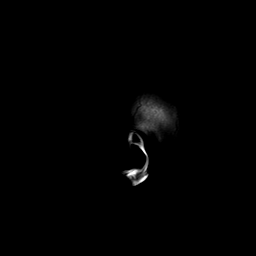

[Series 6: DWI · axial · 3.0mm · 0.94mm/px · z∈[-48,+92]mm · 10 of 160 slices shown (1 of 3)]
[im 1/160]
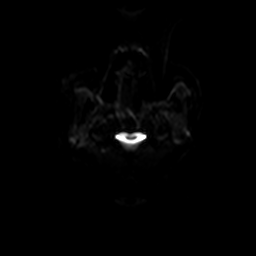
[im 18/160]
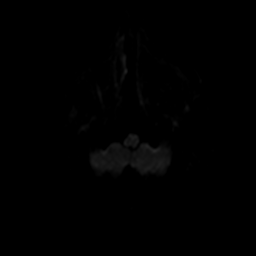
[im 36/160]
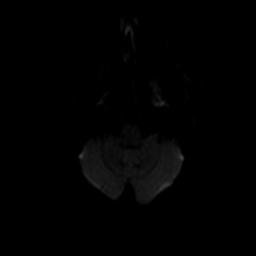
[im 54/160]
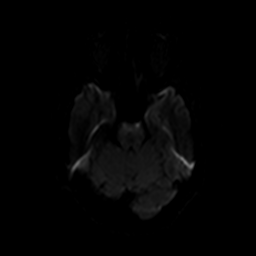
[im 71/160]
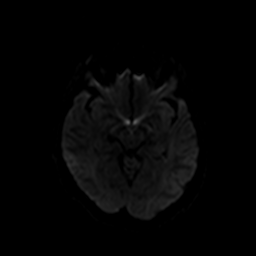
[im 89/160]
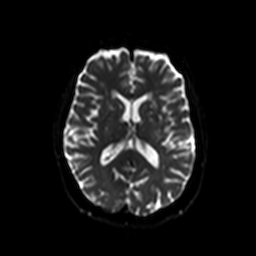
[im 107/160]
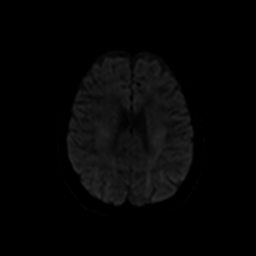
[im 124/160]
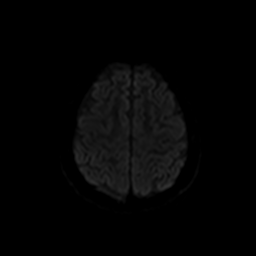
[im 142/160]
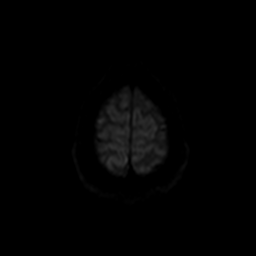
[im 160/160]
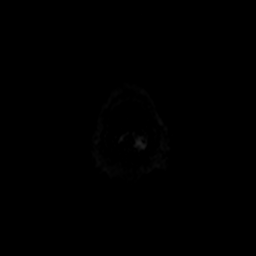

[Series 7: ax dwi_tracew · axial · 3.0mm · 0.94mm/px · z∈[-48,+92]mm · 5 of 80 slices shown]
[im 1/80]
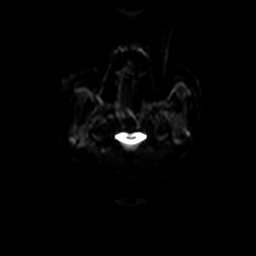
[im 20/80]
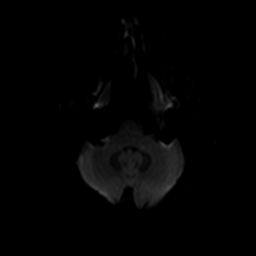
[im 40/80]
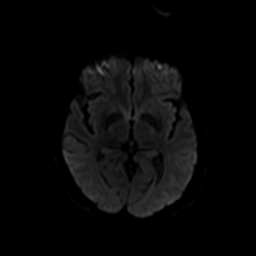
[im 60/80]
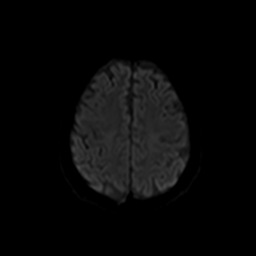
[im 80/80]
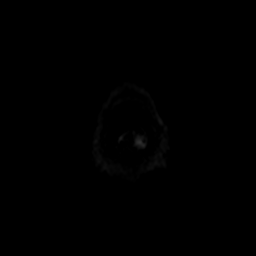

[Series 8: ax dwi_adc · axial · 3.0mm · 0.94mm/px · z∈[-48,+92]mm · 3 of 40 slices shown]
[im 1/40]
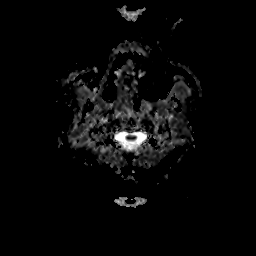
[im 20/40]
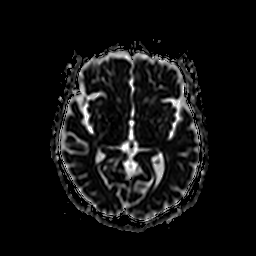
[im 40/40]
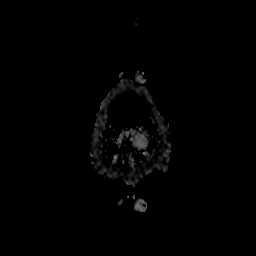

[Series 9: DWI · coronal · 5.0mm · 1.44mm/px · 4 of 66 slices shown (2 of 3)]
[im 1/66]
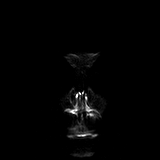
[im 22/66]
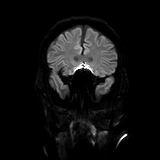
[im 44/66]
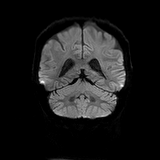
[im 66/66]
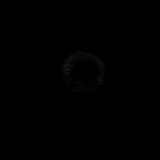

[Series 10: DWI · coronal · 5.0mm · 1.44mm/px · 2 of 33 slices shown (3 of 3)]
[im 1/33]
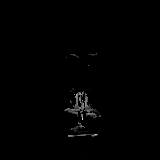
[im 33/33]
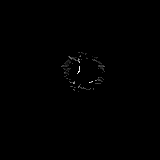

[Series 11: T2 · axial · 4.0mm · 0.36mm/px · z∈[-50,+85]mm · 2 of 27 slices shown (1 of 2)]
[im 1/27]
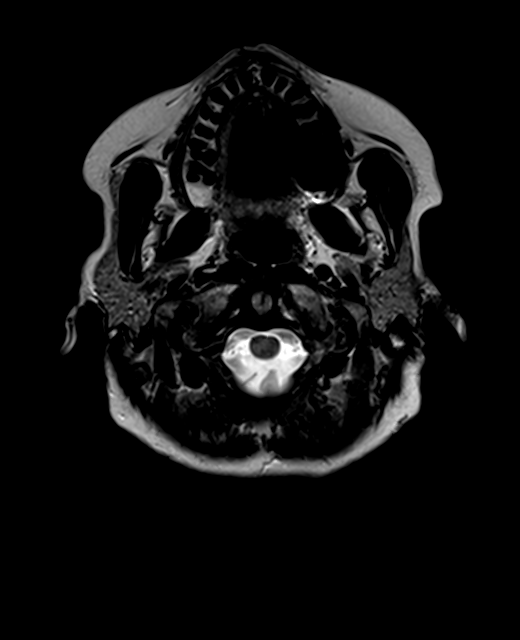
[im 27/27]
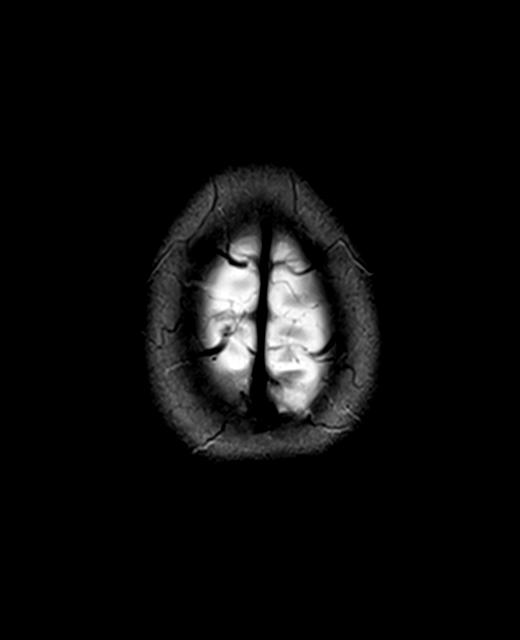

[Series 12: FLAIR · axial · 3.0mm · 0.72mm/px · z∈[-58,+91]mm · 2 of 26 slices shown]
[im 1/26]
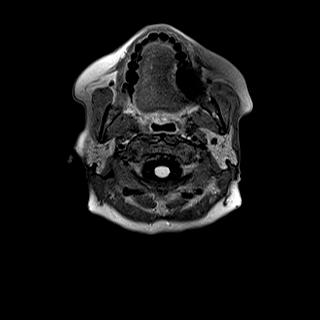
[im 26/26]
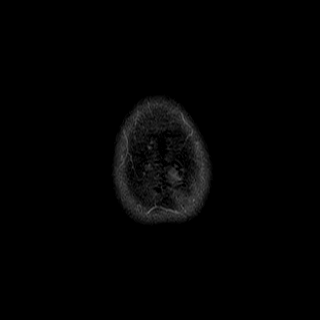

[Series 13: swi_images · axial · 1.5mm · 0.90mm/px · z∈[-54,+88]mm · 6 of 96 slices shown]
[im 1/96]
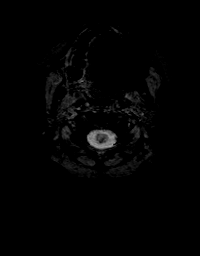
[im 20/96]
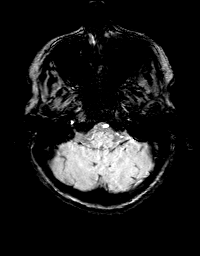
[im 39/96]
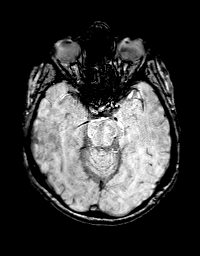
[im 58/96]
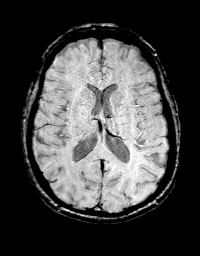
[im 77/96]
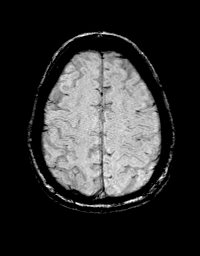
[im 96/96]
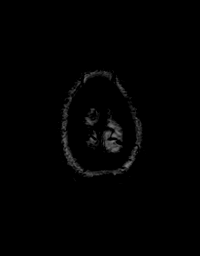

[Series 15: T1 · axial · 1.0mm · 0.94mm/px · z∈[-71,+87]mm · 10 of 160 slices shown (2 of 2)]
[im 1/160]
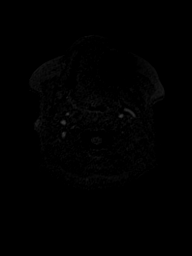
[im 18/160]
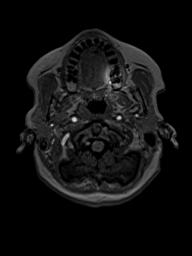
[im 36/160]
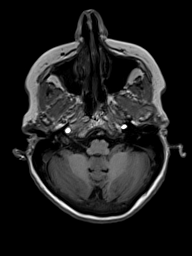
[im 54/160]
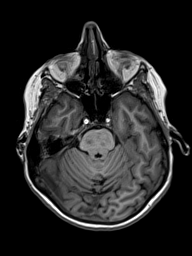
[im 71/160]
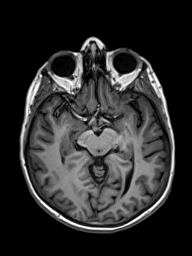
[im 89/160]
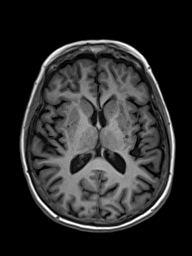
[im 107/160]
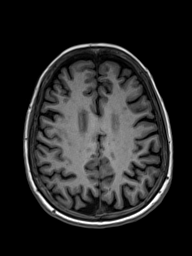
[im 124/160]
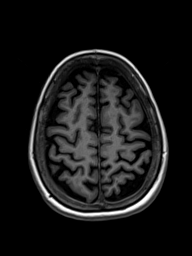
[im 142/160]
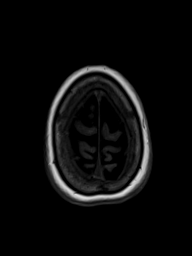
[im 160/160]
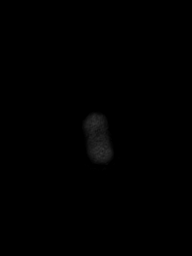

[Series 16: T2 · coronal · 4.5mm · 0.36mm/px · 2 of 32 slices shown (2 of 2)]
[im 1/32]
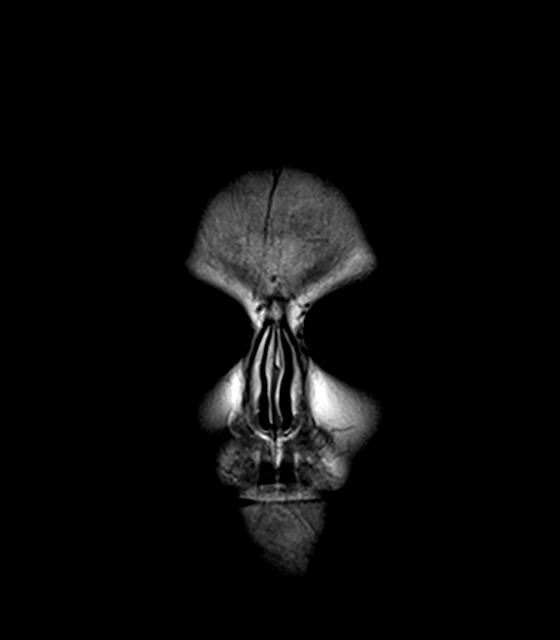
[im 32/32]
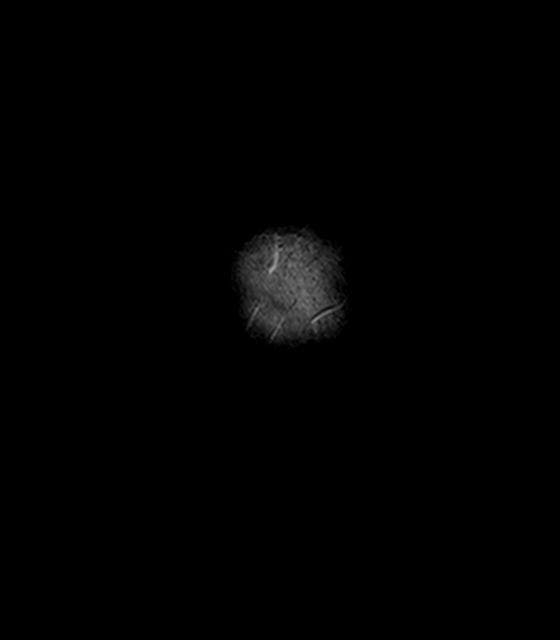

[48 of 48 positions shown; findings below may reference images not displayed]

FINDINGS: MRI HEAD FINDINGS

Brain: Diffusion imaging does not show any acute or subacute
infarction. There chronic small-vessel ischemic changes affecting
the pons. No focal cerebellar finding. Cerebral hemispheres show
moderate chronic small-vessel ischemic changes of the deep and
subcortical white matter, often seen at this age. No cortical or
large vessel territory infarction. No mass lesion, hemorrhage,
hydrocephalus or extra-axial collection.

Vascular: Major vessels at the base of the brain show flow.

Skull and upper cervical spine: Negative

Sinuses/Orbits: Clear except for mucosal thickening of the right
maxillary sinus floor. Orbits negative.

Other: None

MRA HEAD FINDINGS

Both internal carotid arteries are widely patent through the skull
base and siphon regions. The anterior and middle cerebral vessels
are patent without proximal stenosis, aneurysm or vascular
malformation. Fetal origin right PCA.

Both vertebral arteries are patent through the foramen magnum. Right
vertebral artery largely terminates in a large and somewhat
anomalous PICA. Small contribution to the basilar. Normal appearing
superior cerebellar artery flow and posterior cerebral artery flow.

MRA NECK FINDINGS

Branching pattern from the arch is normal. Both common carotid
arteries are widely patent to the bifurcation regions. Both carotid
bifurcations are normal. No stenosis or irregularity. Both cervical
internal carotid arteries are normal.

Both vertebral arteries appear widely patent at their origins and
through the cervical region to foramen magnum.
IMPRESSION: Moderate chronic small-vessel ischemic changes of the pons and
cerebral hemispheric white matter. No acute or subacute insult.

Negative MR angiography of the neck vessels.

No evidence of acquired large or medium vessel intracranial vascular
abnormality. Some posterior circulation congenital variations, not
of any clinical significance.

## 2020-10-16 IMAGING — MR MR MRA NECK WO/W CM
4 of 8 series · 25 of 48 positions shown · IV contrast (multihance)
Comparison: Head CT [DATE]

CLINICAL DATA: Facial tingling and left arm tingling. Question
stroke.

EXAM:
MRI HEAD WITHOUT  CONTRAST
MRA HEAD WITHOUT CONTRAST
MRA NECK WITHOUT AND WITH CONTRAST
TECHNIQUE: Multiplanar, multiecho pulse sequences of the brain and surrounding
structures were obtained without and with intravenous contrast.
Angiographic images of the Circle of Willis were obtained using MRA
technique without intravenous contrast. Angiographic images of the
neck were obtained using MRA technique without and with intravenous
contrast. Carotid stenosis measurements (when applicable) are
obtained utilizing NASCET criteria, using the distal internal
carotid diameter as the denominator.
CONTRAST:  10mL MULTIHANCE GADOBENATE DIMEGLUMINE 529 MG/ML IV SOLN

[Series 7: tof_2d neck non · axial · 3.0mm · 0.98mm/px · z∈[-238,-33]mm · 7 of 103 slices shown]
[im 1/103]
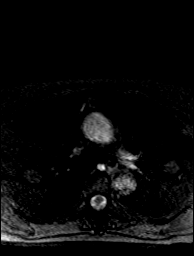
[im 18/103]
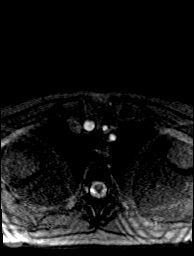
[im 35/103]
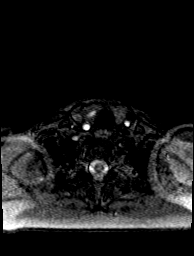
[im 52/103]
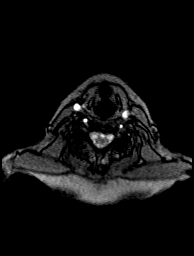
[im 69/103]
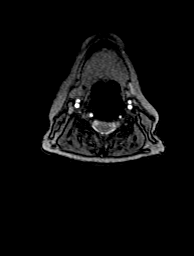
[im 86/103]
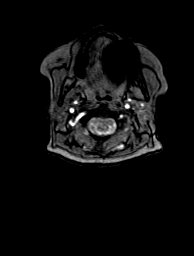
[im 103/103]
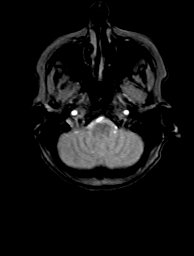

[Series 13: angio_fl3d_cor_pre · coronal · 0.8mm · 0.85mm/px · 6 of 96 slices shown]
[im 1/96]
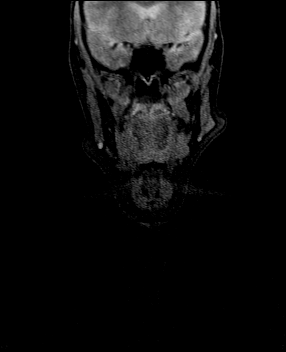
[im 20/96]
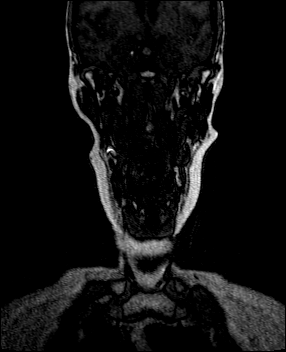
[im 39/96]
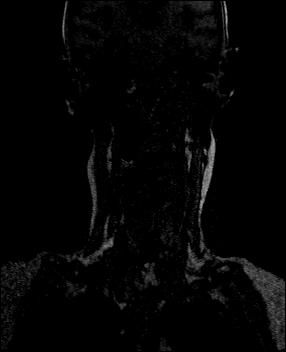
[im 58/96]
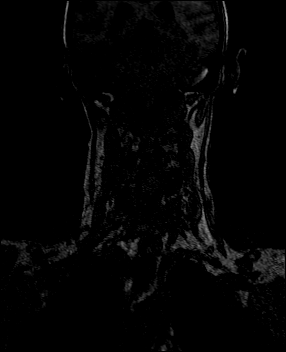
[im 77/96]
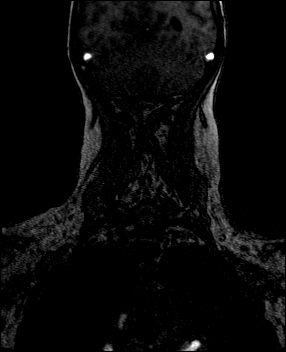
[im 96/96]
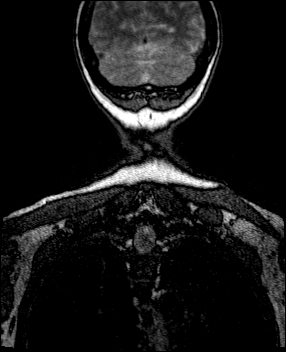

[Series 15: angio_fl3d_cor_post · coronal · 0.8mm · 0.85mm/px · 6 of 95 slices shown]
[im 1/95]
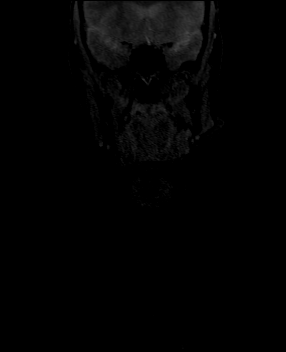
[im 19/95]
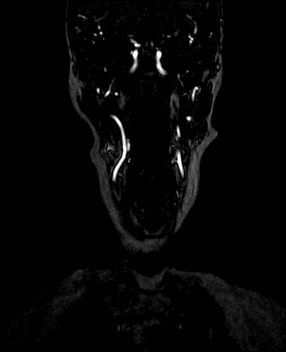
[im 38/95]
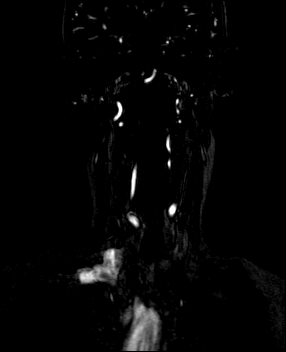
[im 57/95]
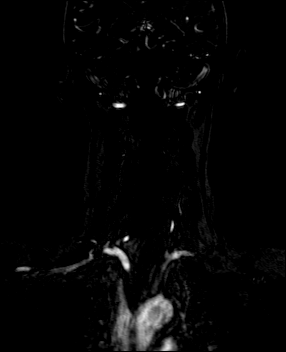
[im 76/95]
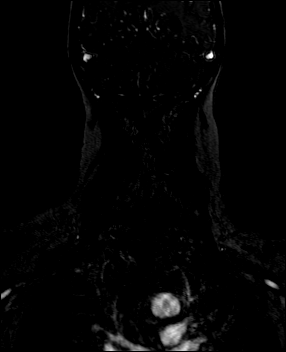
[im 95/95]
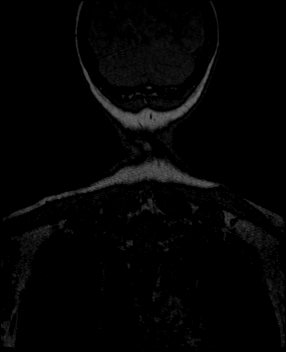

[Series 16: angio_fl3d_cor_post_sub · coronal · 0.8mm · 0.85mm/px · 6 of 96 slices shown]
[im 1/96]
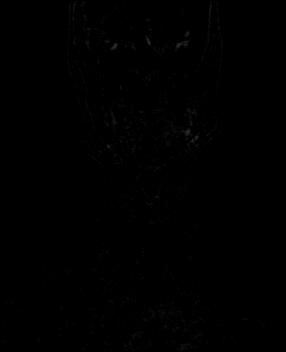
[im 20/96]
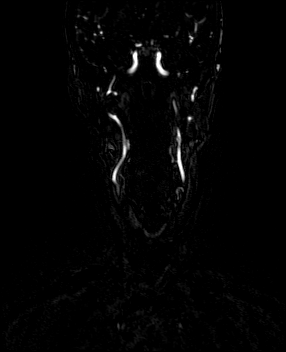
[im 39/96]
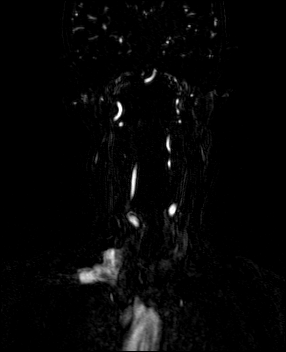
[im 58/96]
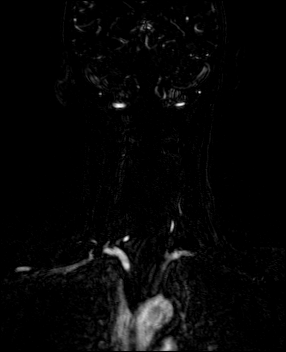
[im 77/96]
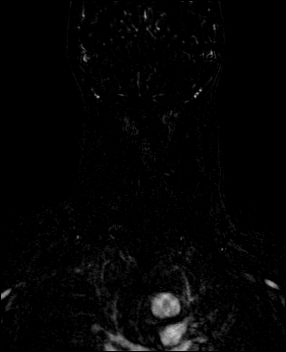
[im 96/96]
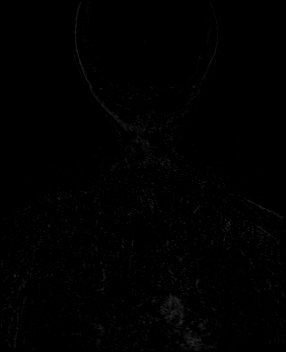

[25 of 48 positions shown; findings below may reference images not displayed]

FINDINGS: MRI HEAD FINDINGS

Brain: Diffusion imaging does not show any acute or subacute
infarction. There chronic small-vessel ischemic changes affecting
the pons. No focal cerebellar finding. Cerebral hemispheres show
moderate chronic small-vessel ischemic changes of the deep and
subcortical white matter, often seen at this age. No cortical or
large vessel territory infarction. No mass lesion, hemorrhage,
hydrocephalus or extra-axial collection.

Vascular: Major vessels at the base of the brain show flow.

Skull and upper cervical spine: Negative

Sinuses/Orbits: Clear except for mucosal thickening of the right
maxillary sinus floor. Orbits negative.

Other: None

MRA HEAD FINDINGS

Both internal carotid arteries are widely patent through the skull
base and siphon regions. The anterior and middle cerebral vessels
are patent without proximal stenosis, aneurysm or vascular
malformation. Fetal origin right PCA.

Both vertebral arteries are patent through the foramen magnum. Right
vertebral artery largely terminates in a large and somewhat
anomalous PICA. Small contribution to the basilar. Normal appearing
superior cerebellar artery flow and posterior cerebral artery flow.

MRA NECK FINDINGS

Branching pattern from the arch is normal. Both common carotid
arteries are widely patent to the bifurcation regions. Both carotid
bifurcations are normal. No stenosis or irregularity. Both cervical
internal carotid arteries are normal.

Both vertebral arteries appear widely patent at their origins and
through the cervical region to foramen magnum.
IMPRESSION: Moderate chronic small-vessel ischemic changes of the pons and
cerebral hemispheric white matter. No acute or subacute insult.

Negative MR angiography of the neck vessels.

No evidence of acquired large or medium vessel intracranial vascular
abnormality. Some posterior circulation congenital variations, not
of any clinical significance.

## 2020-10-16 MED ORDER — GADOBENATE DIMEGLUMINE 529 MG/ML IV SOLN
10.0000 mL | Freq: Once | INTRAVENOUS | Status: AC | PRN
  Administered 2020-10-16: 10 mL via INTRAVENOUS

## 2020-11-11 DIAGNOSIS — J309 Allergic rhinitis, unspecified: Secondary | ICD-10-CM | POA: Diagnosis not present

## 2020-11-24 DIAGNOSIS — J309 Allergic rhinitis, unspecified: Secondary | ICD-10-CM | POA: Diagnosis not present

## 2020-11-24 DIAGNOSIS — F418 Other specified anxiety disorders: Secondary | ICD-10-CM | POA: Diagnosis not present

## 2020-11-24 DIAGNOSIS — R531 Weakness: Secondary | ICD-10-CM | POA: Diagnosis not present

## 2020-11-24 DIAGNOSIS — R490 Dysphonia: Secondary | ICD-10-CM | POA: Diagnosis not present

## 2020-11-24 DIAGNOSIS — E538 Deficiency of other specified B group vitamins: Secondary | ICD-10-CM | POA: Diagnosis not present

## 2020-11-24 DIAGNOSIS — I10 Essential (primary) hypertension: Secondary | ICD-10-CM | POA: Diagnosis not present

## 2020-11-24 DIAGNOSIS — E785 Hyperlipidemia, unspecified: Secondary | ICD-10-CM | POA: Diagnosis not present

## 2020-11-25 DIAGNOSIS — J301 Allergic rhinitis due to pollen: Secondary | ICD-10-CM | POA: Diagnosis not present

## 2020-11-29 ENCOUNTER — Encounter: Payer: Self-pay | Admitting: Neurology

## 2020-11-29 DIAGNOSIS — J301 Allergic rhinitis due to pollen: Secondary | ICD-10-CM | POA: Diagnosis not present

## 2020-12-06 DIAGNOSIS — J301 Allergic rhinitis due to pollen: Secondary | ICD-10-CM | POA: Diagnosis not present

## 2020-12-13 DIAGNOSIS — J301 Allergic rhinitis due to pollen: Secondary | ICD-10-CM | POA: Diagnosis not present

## 2020-12-15 DIAGNOSIS — M5386 Other specified dorsopathies, lumbar region: Secondary | ICD-10-CM | POA: Diagnosis not present

## 2020-12-15 DIAGNOSIS — M9903 Segmental and somatic dysfunction of lumbar region: Secondary | ICD-10-CM | POA: Diagnosis not present

## 2020-12-15 DIAGNOSIS — M9902 Segmental and somatic dysfunction of thoracic region: Secondary | ICD-10-CM | POA: Diagnosis not present

## 2020-12-15 DIAGNOSIS — M9904 Segmental and somatic dysfunction of sacral region: Secondary | ICD-10-CM | POA: Diagnosis not present

## 2020-12-15 DIAGNOSIS — M5417 Radiculopathy, lumbosacral region: Secondary | ICD-10-CM | POA: Diagnosis not present

## 2020-12-15 DIAGNOSIS — S29012A Strain of muscle and tendon of back wall of thorax, initial encounter: Secondary | ICD-10-CM | POA: Diagnosis not present

## 2020-12-15 DIAGNOSIS — M9905 Segmental and somatic dysfunction of pelvic region: Secondary | ICD-10-CM | POA: Diagnosis not present

## 2020-12-15 DIAGNOSIS — M5137 Other intervertebral disc degeneration, lumbosacral region: Secondary | ICD-10-CM | POA: Diagnosis not present

## 2020-12-17 DIAGNOSIS — M9905 Segmental and somatic dysfunction of pelvic region: Secondary | ICD-10-CM | POA: Diagnosis not present

## 2020-12-17 DIAGNOSIS — M9903 Segmental and somatic dysfunction of lumbar region: Secondary | ICD-10-CM | POA: Diagnosis not present

## 2020-12-17 DIAGNOSIS — M9904 Segmental and somatic dysfunction of sacral region: Secondary | ICD-10-CM | POA: Diagnosis not present

## 2020-12-17 DIAGNOSIS — M5137 Other intervertebral disc degeneration, lumbosacral region: Secondary | ICD-10-CM | POA: Diagnosis not present

## 2020-12-17 DIAGNOSIS — M5386 Other specified dorsopathies, lumbar region: Secondary | ICD-10-CM | POA: Diagnosis not present

## 2020-12-17 DIAGNOSIS — M5417 Radiculopathy, lumbosacral region: Secondary | ICD-10-CM | POA: Diagnosis not present

## 2020-12-17 DIAGNOSIS — M9902 Segmental and somatic dysfunction of thoracic region: Secondary | ICD-10-CM | POA: Diagnosis not present

## 2020-12-17 DIAGNOSIS — S29012A Strain of muscle and tendon of back wall of thorax, initial encounter: Secondary | ICD-10-CM | POA: Diagnosis not present

## 2020-12-21 DIAGNOSIS — M9902 Segmental and somatic dysfunction of thoracic region: Secondary | ICD-10-CM | POA: Diagnosis not present

## 2020-12-21 DIAGNOSIS — M9904 Segmental and somatic dysfunction of sacral region: Secondary | ICD-10-CM | POA: Diagnosis not present

## 2020-12-21 DIAGNOSIS — M9905 Segmental and somatic dysfunction of pelvic region: Secondary | ICD-10-CM | POA: Diagnosis not present

## 2020-12-21 DIAGNOSIS — M5386 Other specified dorsopathies, lumbar region: Secondary | ICD-10-CM | POA: Diagnosis not present

## 2020-12-21 DIAGNOSIS — M5417 Radiculopathy, lumbosacral region: Secondary | ICD-10-CM | POA: Diagnosis not present

## 2020-12-21 DIAGNOSIS — S29012A Strain of muscle and tendon of back wall of thorax, initial encounter: Secondary | ICD-10-CM | POA: Diagnosis not present

## 2020-12-21 DIAGNOSIS — M5137 Other intervertebral disc degeneration, lumbosacral region: Secondary | ICD-10-CM | POA: Diagnosis not present

## 2020-12-21 DIAGNOSIS — M9903 Segmental and somatic dysfunction of lumbar region: Secondary | ICD-10-CM | POA: Diagnosis not present

## 2020-12-22 DIAGNOSIS — J301 Allergic rhinitis due to pollen: Secondary | ICD-10-CM | POA: Diagnosis not present

## 2020-12-24 DIAGNOSIS — M5386 Other specified dorsopathies, lumbar region: Secondary | ICD-10-CM | POA: Diagnosis not present

## 2020-12-24 DIAGNOSIS — M5417 Radiculopathy, lumbosacral region: Secondary | ICD-10-CM | POA: Diagnosis not present

## 2020-12-24 DIAGNOSIS — M9904 Segmental and somatic dysfunction of sacral region: Secondary | ICD-10-CM | POA: Diagnosis not present

## 2020-12-24 DIAGNOSIS — M9905 Segmental and somatic dysfunction of pelvic region: Secondary | ICD-10-CM | POA: Diagnosis not present

## 2020-12-24 DIAGNOSIS — M5137 Other intervertebral disc degeneration, lumbosacral region: Secondary | ICD-10-CM | POA: Diagnosis not present

## 2020-12-24 DIAGNOSIS — S29012A Strain of muscle and tendon of back wall of thorax, initial encounter: Secondary | ICD-10-CM | POA: Diagnosis not present

## 2020-12-24 DIAGNOSIS — M9903 Segmental and somatic dysfunction of lumbar region: Secondary | ICD-10-CM | POA: Diagnosis not present

## 2020-12-24 DIAGNOSIS — M9902 Segmental and somatic dysfunction of thoracic region: Secondary | ICD-10-CM | POA: Diagnosis not present

## 2020-12-27 DIAGNOSIS — M9903 Segmental and somatic dysfunction of lumbar region: Secondary | ICD-10-CM | POA: Diagnosis not present

## 2020-12-27 DIAGNOSIS — M5386 Other specified dorsopathies, lumbar region: Secondary | ICD-10-CM | POA: Diagnosis not present

## 2020-12-27 DIAGNOSIS — M9905 Segmental and somatic dysfunction of pelvic region: Secondary | ICD-10-CM | POA: Diagnosis not present

## 2020-12-27 DIAGNOSIS — M5137 Other intervertebral disc degeneration, lumbosacral region: Secondary | ICD-10-CM | POA: Diagnosis not present

## 2020-12-27 DIAGNOSIS — M5417 Radiculopathy, lumbosacral region: Secondary | ICD-10-CM | POA: Diagnosis not present

## 2020-12-27 DIAGNOSIS — S29012A Strain of muscle and tendon of back wall of thorax, initial encounter: Secondary | ICD-10-CM | POA: Diagnosis not present

## 2020-12-27 DIAGNOSIS — M9902 Segmental and somatic dysfunction of thoracic region: Secondary | ICD-10-CM | POA: Diagnosis not present

## 2020-12-27 DIAGNOSIS — M9904 Segmental and somatic dysfunction of sacral region: Secondary | ICD-10-CM | POA: Diagnosis not present

## 2020-12-30 DIAGNOSIS — J301 Allergic rhinitis due to pollen: Secondary | ICD-10-CM | POA: Diagnosis not present

## 2020-12-31 DIAGNOSIS — M5417 Radiculopathy, lumbosacral region: Secondary | ICD-10-CM | POA: Diagnosis not present

## 2020-12-31 DIAGNOSIS — M9905 Segmental and somatic dysfunction of pelvic region: Secondary | ICD-10-CM | POA: Diagnosis not present

## 2020-12-31 DIAGNOSIS — S29012A Strain of muscle and tendon of back wall of thorax, initial encounter: Secondary | ICD-10-CM | POA: Diagnosis not present

## 2020-12-31 DIAGNOSIS — M5386 Other specified dorsopathies, lumbar region: Secondary | ICD-10-CM | POA: Diagnosis not present

## 2020-12-31 DIAGNOSIS — M9904 Segmental and somatic dysfunction of sacral region: Secondary | ICD-10-CM | POA: Diagnosis not present

## 2020-12-31 DIAGNOSIS — M5137 Other intervertebral disc degeneration, lumbosacral region: Secondary | ICD-10-CM | POA: Diagnosis not present

## 2020-12-31 DIAGNOSIS — M9902 Segmental and somatic dysfunction of thoracic region: Secondary | ICD-10-CM | POA: Diagnosis not present

## 2020-12-31 DIAGNOSIS — M9903 Segmental and somatic dysfunction of lumbar region: Secondary | ICD-10-CM | POA: Diagnosis not present

## 2021-01-05 ENCOUNTER — Other Ambulatory Visit: Payer: Self-pay | Admitting: Internal Medicine

## 2021-01-05 ENCOUNTER — Other Ambulatory Visit: Payer: Self-pay | Admitting: Family Medicine

## 2021-01-05 DIAGNOSIS — E162 Hypoglycemia, unspecified: Secondary | ICD-10-CM | POA: Diagnosis not present

## 2021-01-05 DIAGNOSIS — Z0000 Encounter for general adult medical examination without abnormal findings: Secondary | ICD-10-CM | POA: Diagnosis not present

## 2021-01-05 DIAGNOSIS — N6489 Other specified disorders of breast: Secondary | ICD-10-CM

## 2021-01-05 DIAGNOSIS — Z23 Encounter for immunization: Secondary | ICD-10-CM | POA: Diagnosis not present

## 2021-01-05 DIAGNOSIS — R531 Weakness: Secondary | ICD-10-CM | POA: Diagnosis not present

## 2021-01-05 DIAGNOSIS — E785 Hyperlipidemia, unspecified: Secondary | ICD-10-CM | POA: Diagnosis not present

## 2021-01-05 DIAGNOSIS — R110 Nausea: Secondary | ICD-10-CM | POA: Diagnosis not present

## 2021-01-05 DIAGNOSIS — I10 Essential (primary) hypertension: Secondary | ICD-10-CM | POA: Diagnosis not present

## 2021-01-19 DIAGNOSIS — K2970 Gastritis, unspecified, without bleeding: Secondary | ICD-10-CM | POA: Diagnosis not present

## 2021-01-19 DIAGNOSIS — K5904 Chronic idiopathic constipation: Secondary | ICD-10-CM | POA: Diagnosis not present

## 2021-01-27 DIAGNOSIS — J301 Allergic rhinitis due to pollen: Secondary | ICD-10-CM | POA: Diagnosis not present

## 2021-02-04 ENCOUNTER — Ambulatory Visit
Admission: RE | Admit: 2021-02-04 | Discharge: 2021-02-04 | Disposition: A | Discharge status: Home / Self Care | Payer: Medicare Other | Source: Ambulatory Visit | Attending: Family Medicine | Admitting: Family Medicine

## 2021-02-04 ENCOUNTER — Other Ambulatory Visit: Payer: Self-pay

## 2021-02-04 DIAGNOSIS — N6489 Other specified disorders of breast: Secondary | ICD-10-CM

## 2021-02-04 DIAGNOSIS — R922 Inconclusive mammogram: Secondary | ICD-10-CM | POA: Diagnosis not present

## 2021-02-04 IMAGING — MG DIGITAL DIAGNOSTIC BILAT W/ TOMO W/ CAD
8 series · 9 of 24 positions shown · non-contrast
Comparison: Previous exam(s).

CLINICAL DATA: Follow-up for probably benign asymmetry in the LEFT
breast. This probably benign asymmetry was originally identified on
screening mammogram dated [DATE].

EXAM:
DIGITAL DIAGNOSTIC BILATERAL MAMMOGRAM WITH TOMOSYNTHESIS AND CAD
TECHNIQUE: Bilateral digital diagnostic mammography and breast tomosynthesis
was performed. The images were evaluated with computer-aided
detection.

[L MLO synth-2D]
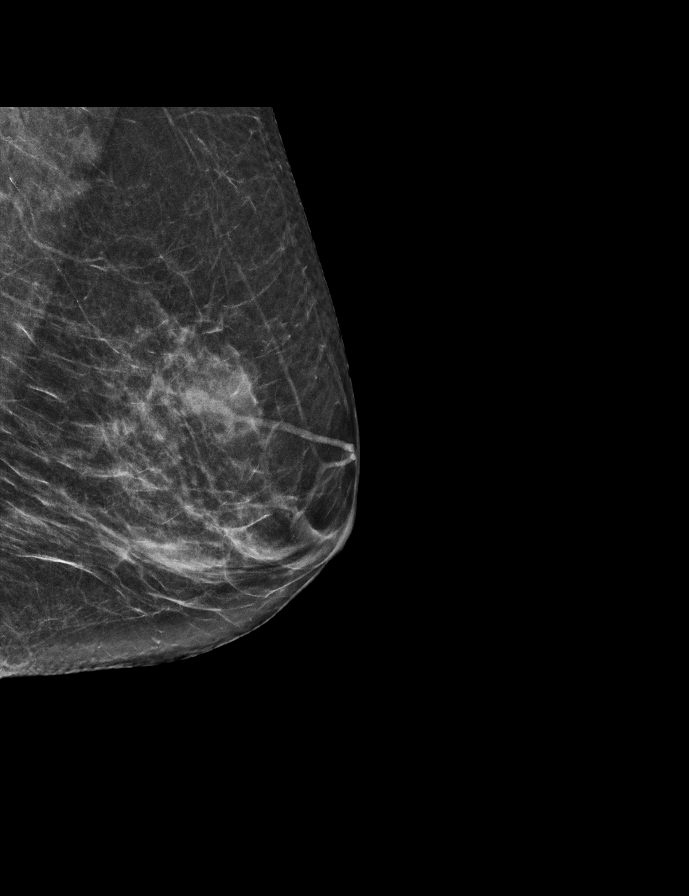

[R MLO synth-2D]
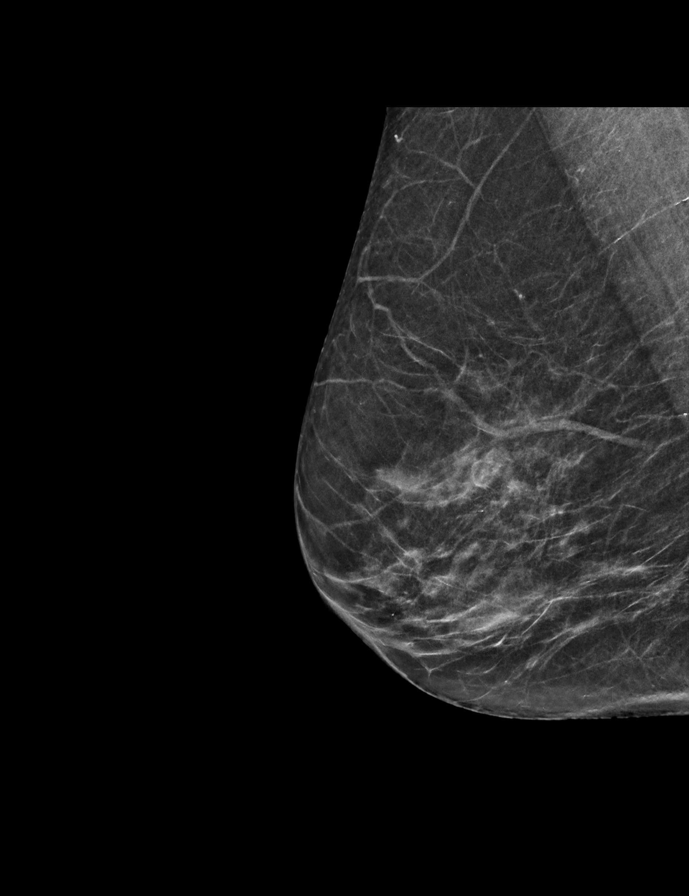

[R CC synth-2D]
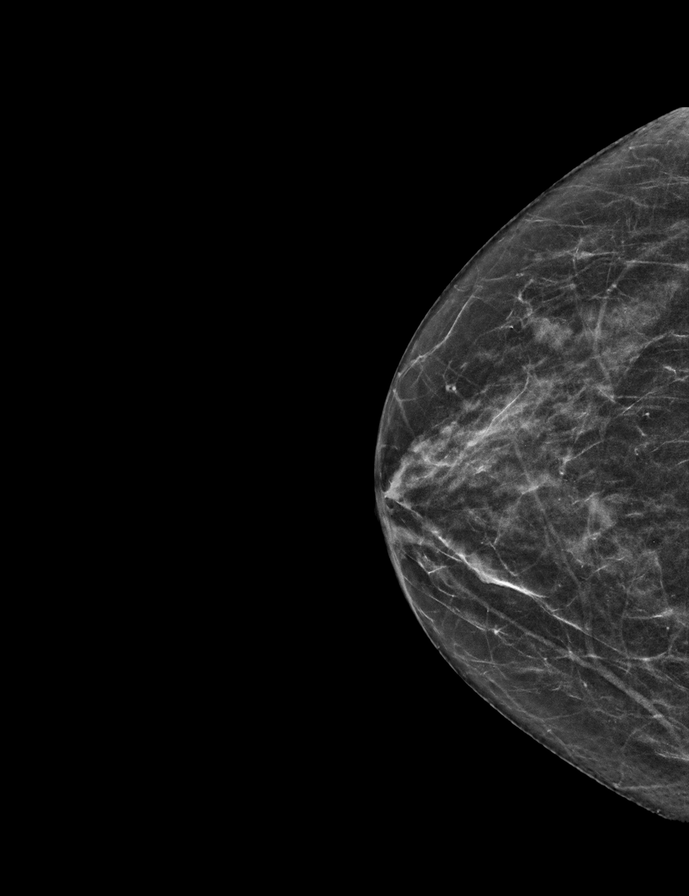

[L CC synth-2D]
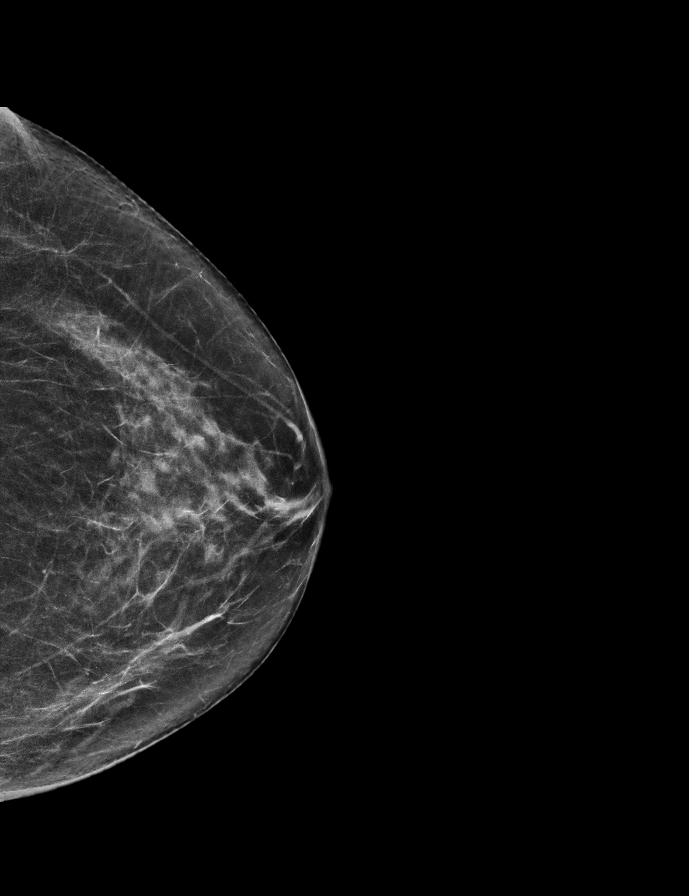

[L CC tomo · 2 of 57 frames shown]
[frame 19/57]
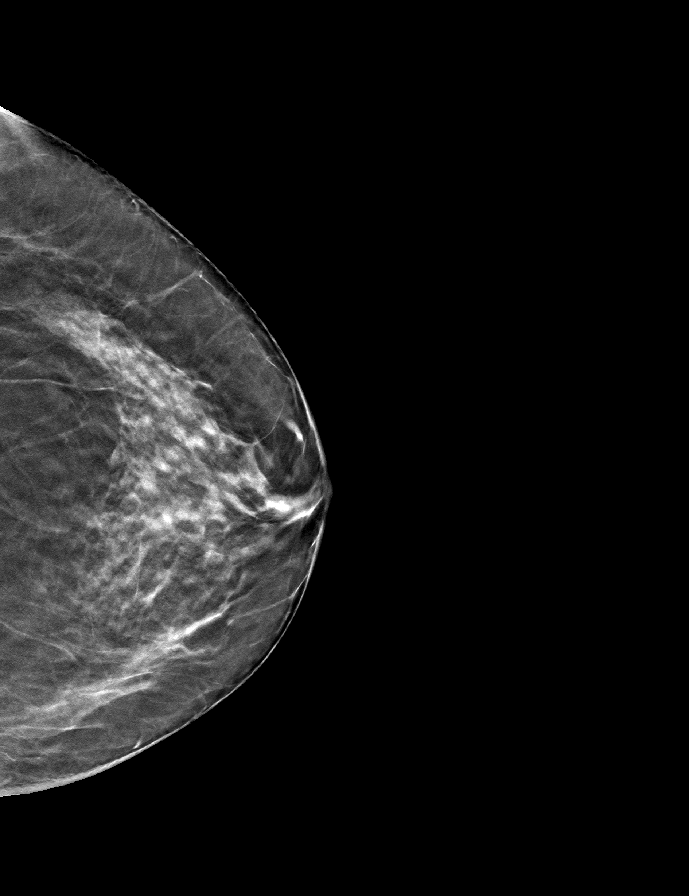
[frame 29/57]
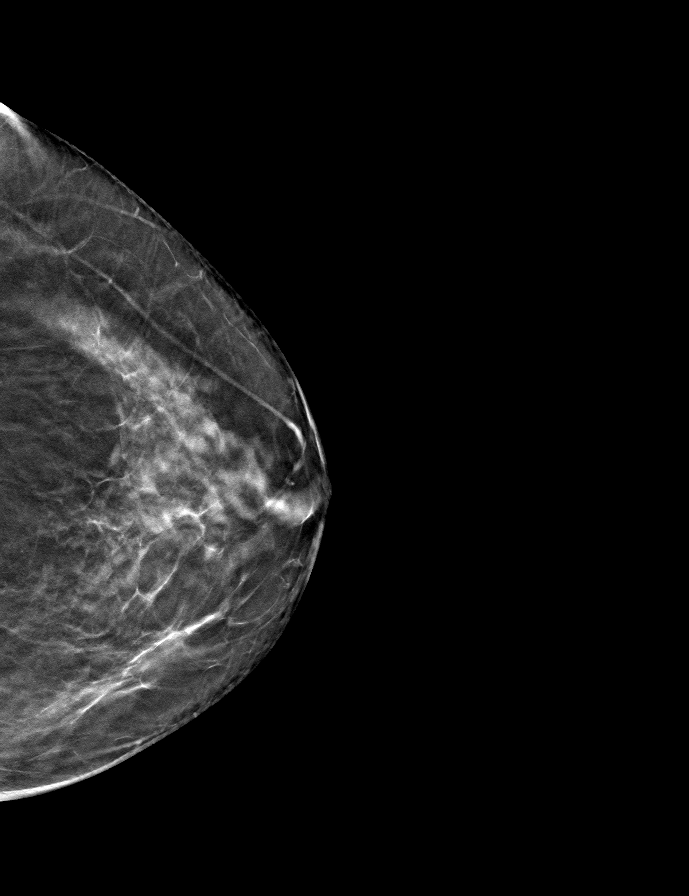

[R CC tomo · tomo slice 25/48.0]
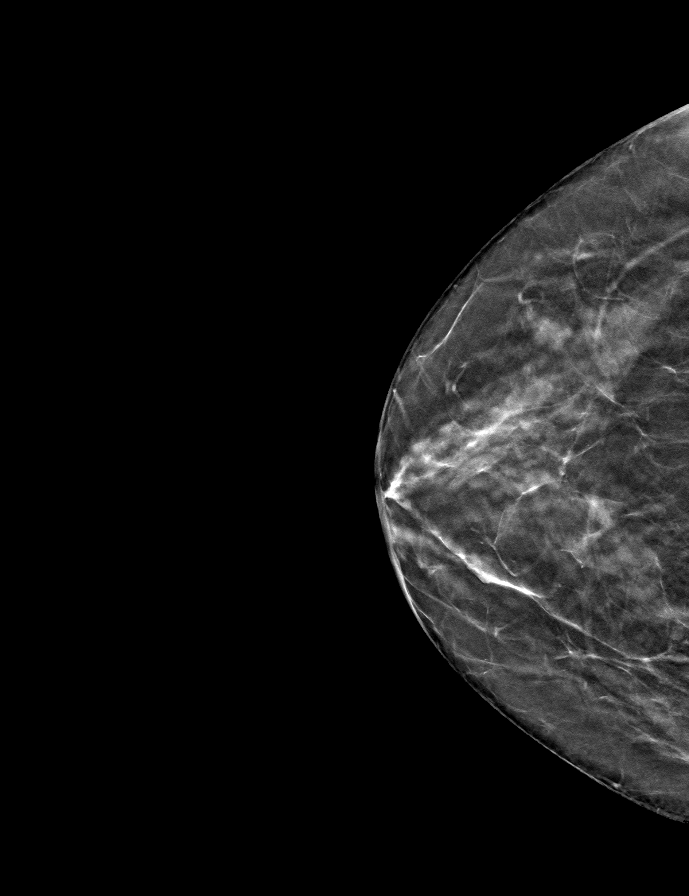

[R MLO tomo · tomo slice 27/52.0]
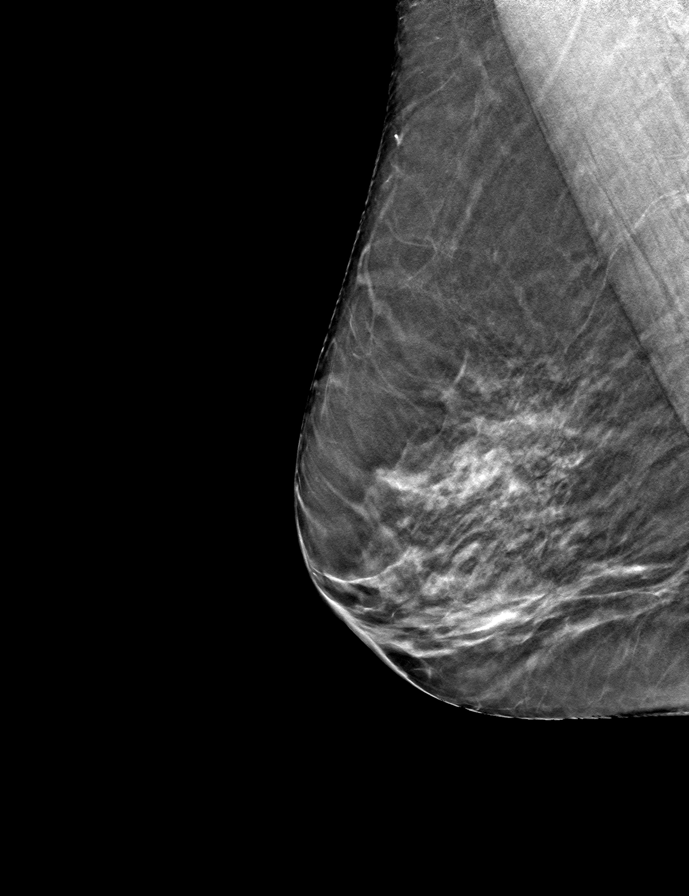

[L MLO tomo · tomo slice 27/53.0]
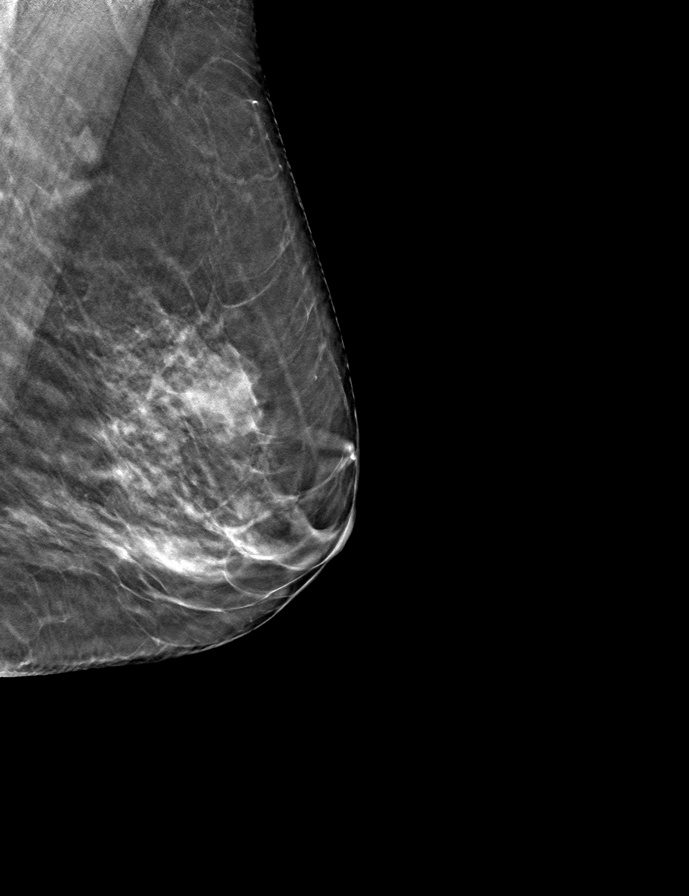

[9 of 24 positions shown; findings below may reference images not displayed]

ACR Breast Density Category b: There are scattered areas of
fibroglandular density.
FINDINGS: The previously described asymmetry within the upper LEFT breast is
stable, now shown stable for greater than 2 years confirming
benignity, presumably an island of normal accessory fibroglandular
tissue.

There are no new dominant masses, suspicious calcifications or
secondary signs of malignancy within either breast.
IMPRESSION: No evidence of malignancy within either breast.

Patient may return to routine annual bilateral screening mammogram
schedule.

RECOMMENDATION:
Screening mammogram in one year.(Code:[ES])

I have discussed the findings and recommendations with the patient.
If applicable, a reminder letter will be sent to the patient
regarding the next appointment.

BI-RADS CATEGORY  2: Benign.

## 2021-02-07 DIAGNOSIS — E871 Hypo-osmolality and hyponatremia: Secondary | ICD-10-CM | POA: Diagnosis not present

## 2021-02-16 ENCOUNTER — Ambulatory Visit: Payer: Medicare Other | Admitting: Neurology

## 2021-02-21 DIAGNOSIS — J301 Allergic rhinitis due to pollen: Secondary | ICD-10-CM | POA: Diagnosis not present

## 2021-02-22 NOTE — Progress Notes (Signed)
NEUROLOGY CONSULTATION NOTE  Roanna Reaves MRN: [id number] DOB: 1950-06-14  Referring provider: London Pepper, MD Primary care provider: London Pepper, MD  Reason for consult:  weakness, facial tingling  Assessment/Plan:   Episodes of near-syncope/dysautonomia - unclear etiology - not consistent with TIA or migraine - low suspicion but would evaluate for possible seizure Brief episodes of facial and right upper extremity paresthesias - TIA possible but not probable as they are habitual episodes several weeks apart, which would mean she had two TIAs involving the same part of the brain in absence of a correlating arterial stenosis.  However, she has already been worked up for TIA and is on maximum medical management. 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  Further recommendations pending results.  2  Follow up after testing.   Subjective:  PENNY ARRAMBIDE is a 70 year old female with HTN, LBBB, arthritis and GERD who presents for weakness, facial tingling.  History supplemented by referring provider's note.  Since she was in her 59s, she has had transient episodes of near syncope.  It happens spontaneously. I happens with activity or with rest, standing or sitting.  She develops a sudden wave of fatigue and diffuse weakness with feeling clammy, sweaty and sick to her stomach.  Sometimes she may vomit.  When it occurs, she needs to immediately lay down or else she will fall and sometimes may pass out..  Symptoms of extreme fatigue and weakness will last the rest of the day.  No associated vertigo, headache, visual disturbance or palpitations.  She may have 2 in 3 months or she may have no events in a year.  She is followed by cardiology for LBBB and has had a cardiac workup, including a holter monitor but did not capture an event.  Possible diagnoses suggested were vasovagal syncope or cardiogenic syncope, but really nobody knows the etiology of  these events.    Over the past year, she had two episode of numbness and tingling several weeks apart.  She describes a numbness and tingling involving her face and right arm lasting 15 minutes.  No headache, unilateral weakness, facial droop.  Beginning this year, she has had what has been diagnosed by the ophthalmologist as ocular migraines.  She will develop unilateral kaleidoscope vision in either eye, sometimes preceded by a dark scotoma, lasting about 10 minutes and followed by a dull headache.  She has had about 10 in an 8-9 month period.  She denies prior history of headaches.   MRI of brain without contrast on 10/16/2020 personally reviewed showed moderate chronic small vessel ischemic changes in the cerebral white matter but no acute abnormalities.  MRA of head and neck were unremarkable.    Labs from August include TSH 0.93, sed rate 10, B12 322,      PAST MEDICAL HISTORY: Past Medical History:  Diagnosis Date   Allergic rhinitis due to other allergen    Anginal pain ([place]) 2011   hospitalized for CP in the past; due to low potassium   Anxiety    Arthritis    Childhood asthma    Chronic hoarseness    Gastroparesis    GERD (gastroesophageal reflux disease)    History of hiatal hernia    Hx of blood transfusion reaction    in the early 80s   Hypertension    Left bundle branch block     (abnormal heart rhythm)   PONV (postoperative nausea and  vomiting)    Pyloric stenosis     PAST SURGICAL HISTORY: Past Surgical History:  Procedure Laterality Date   ABDOMINAL HYSTERECTOMY  1983   CARDIAC CATHETERIZATION  10/2009   Cardiac cath normal coronary arteries   CERVICAL DISC ARTHROPLASTY     disc replacement in neck 1998   COLONOSCOPY     10 year repeat 09/26/2010   ESOPHAGOGASTRODUODENOSCOPY  09/26/2010,01/08/13   LOOP RECORDER INSERTION N/A 05/29/2016   Procedure: Loop Recorder Insertion;  Surgeon: Evans Lance, MD;  Location: [facility];  Service: Cardiovascular;   Laterality: N/A;   OTHER SURGICAL HISTORY  1983   hysterectomy ovaries intact, prolapsed    OTHER SURGICAL HISTORY  2006   pyloric stricture surgery   TOTAL HIP ARTHROPLASTY Right 06/07/2015   Procedure: TOTAL HIP ARTHROPLASTY ANTERIOR APPROACH;  Surgeon: Frederik Pear, MD;  Location: [facility];  Service: Orthopedics;  Laterality: Right;    MEDICATIONS: Current Outpatient Medications on File Prior to Visit  Medication Sig Dispense Refill   albuterol (PROVENTIL HFA;VENTOLIN HFA) 108 (90 Base) MCG/ACT inhaler Inhale 1-2 puffs into the lungs every 4 (four) hours as needed for wheezing or shortness of breath.     aspirin EC 81 MG tablet Take 81 mg by mouth daily.     b complex vitamins tablet Take 1 tablet by mouth daily.     FLUoxetine (PROZAC) 10 MG capsule Take 10 mg by mouth daily.     fluticasone (FLONASE) 50 MCG/ACT nasal spray Place 2 sprays into both nostrils as needed for allergies or rhinitis.     loratadine (CLARITIN) 10 MG tablet Take 10 mg by mouth daily.     losartan (COZAAR) 100 MG tablet Take 100 mg by mouth daily.     Multiple Vitamins-Minerals (CENTRUM SILVER 50+WOMEN PO) Take 1 tablet by mouth daily.     pantoprazole (PROTONIX) 40 MG tablet Take 40 mg by mouth 2 (two) times daily.     rosuvastatin (CRESTOR) 10 MG tablet Take 10 mg by mouth daily.     sucralfate (CARAFATE) 1 g tablet Take 1 g by mouth 2 (two) times daily as needed (Takes if Pantoprazole is not working).      VITAMIN D PO Take 1 tablet by mouth daily.     No current facility-administered medications on file prior to visit.    ALLERGIES: Allergies  Allergen Reactions   Hydrochlorothiazide Nausea Only   Penicillins Other (See Comments)    Unknown Other reaction(s): Other (See Comments) Pt was a child   Adhesive [Tape]     Pt states ' it took my skin off' Steri-Strips   Codeine Nausea And Vomiting   Sulfa Antibiotics Nausea And Vomiting   Vicodin [Hydrocodone-Acetaminophen] Nausea And Vomiting and Other  (See Comments)    Did not feel like herself    FAMILY HISTORY: Family History  Problem Relation Age of Onset   Hypertension Mother    Cirrhosis Father    Hypertension Father    OCD Sister    Depression Sister    Depression Sister    Hypertension Sister     Objective:  Blood pressure (!) 162/93, pulse 78, height 5' (1.524 m), weight 117 lb 6.4 oz (53.3 kg), SpO2 99 %. General: No acute distress.  Patient appears well-groomed.   Head:  Normocephalic/atraumatic Eyes:  fundi examined but not visualized Neck: supple, no paraspinal tenderness, full range of motion Back: No paraspinal tenderness Heart: regular rate and rhythm Lungs: Clear to auscultation bilaterally. Vascular:  No carotid bruits. Neurological Exam: Mental status: alert and oriented to person, place, and time, recent and remote memory intact, fund of knowledge intact, attention and concentration intact, speech fluent and not dysarthric, language intact. Cranial nerves: CN I: not tested CN II: pupils equal, round and reactive to light, visual fields intact CN III, IV, VI:  full range of motion, no nystagmus, no ptosis CN V: facial sensation intact. CN VII: upper and lower face symmetric CN VIII: hearing intact CN IX, X: gag intact, uvula midline CN XI: sternocleidomastoid and trapezius muscles intact CN XII: tongue midline Bulk & Tone: normal, no fasciculations. Motor:  muscle strength 5/5 throughout Sensation:  Pinprick, temperature and vibratory sensation intact. Deep Tendon Reflexes:  2+ throughout,  toes downgoing.   Finger to nose testing:  Without dysmetria.   Heel to shin:  Without dysmetria.   Gait:  Normal station and stride.  Romberg negative.    Thank you for allowing me to take part in the care of this patient.  Metta Clines, DO  CC: London Pepper, MD

## 2021-02-23 ENCOUNTER — Ambulatory Visit (INDEPENDENT_AMBULATORY_CARE_PROVIDER_SITE_OTHER): Payer: Medicare Other | Admitting: Neurology

## 2021-02-23 ENCOUNTER — Encounter: Payer: Self-pay | Admitting: Neurology

## 2021-02-23 ENCOUNTER — Other Ambulatory Visit: Payer: Self-pay

## 2021-02-23 VITALS — BP 162/93 | HR 78 | Ht 60.0 in | Wt 117.4 lb

## 2021-02-23 DIAGNOSIS — R55 Syncope and collapse: Secondary | ICD-10-CM

## 2021-02-23 DIAGNOSIS — G43109 Migraine with aura, not intractable, without status migrainosus: Secondary | ICD-10-CM | POA: Diagnosis not present

## 2021-02-23 DIAGNOSIS — R200 Anesthesia of skin: Secondary | ICD-10-CM

## 2021-02-23 DIAGNOSIS — R202 Paresthesia of skin: Secondary | ICD-10-CM | POA: Diagnosis not present

## 2021-02-23 NOTE — Patient Instructions (Signed)
Will check routine EEG - further recommendations pending results Follow up after testing

## 2021-02-28 ENCOUNTER — Other Ambulatory Visit: Payer: Self-pay

## 2021-02-28 ENCOUNTER — Other Ambulatory Visit: Payer: Medicare Other

## 2021-02-28 ENCOUNTER — Ambulatory Visit (INDEPENDENT_AMBULATORY_CARE_PROVIDER_SITE_OTHER): Payer: Medicare Other | Admitting: Neurology

## 2021-02-28 DIAGNOSIS — R55 Syncope and collapse: Secondary | ICD-10-CM

## 2021-03-01 NOTE — Progress Notes (Signed)
Patient advised of her results. °

## 2021-03-01 NOTE — Procedures (Signed)
ELECTROENCEPHALOGRAM REPORT  Date of Study: 02/28/2021  Patient's Name: Danielle Garrett MRN: [id number] Date of Birth: Sep 05, 1950  Referring Provider: London Pepper, MD  Clinical History: 70 year old female with episodes of near-syncope/dysautonomia (sudden fatigue, clammy, diffuse weakness, diaphoretic, nauseous)  Medications: PROVENTIL HFA;VENTOLIN HFA 108 (90 Base) MCG/ACT inhaler aspirin EC 81 MG tablet b complex vitamins tablet PROZAC 10 MG capsule FLONASE 50 MCG/ACT nasal spray CLARITIN 10 MG tablet COZAAR 100 MG tablet CENTRUM SILVER 50+WOMEN PO PROTONIX 40 MG tablet CRESTOR 10 MG tablet CARAFATE 1 g tablet VITAMIN D PO  Technical Summary: A multichannel digital EEG recording measured by the international 10-20 system with electrodes applied with paste and impedances below 5000 ohms performed in our laboratory with EKG monitoring in an awake and asleep patient.  Photic stimulation was performed.  The digital EEG was referentially recorded, reformatted, and digitally filtered in a variety of bipolar and referential montages for optimal display.    Description: The patient is awake and asleep during the recording.  During maximal wakefulness, there is a symmetric, medium voltage 8 Hz posterior dominant rhythm that attenuates with eye opening.  The record is symmetric.  During drowsiness and sleep, there is an increase in theta slowing of the background.  Stage 2 sleep was seen.  Photic stimulation did not elicit any abnormalities.  There were no epileptiform discharges or electrographic seizures seen.    EKG lead was unremarkable.  Impression: This awake and asleep EEG is normal.    Clinical Correlation: A normal EEG does not exclude a clinical diagnosis of epilepsy.  If further clinical questions remain, prolonged EEG may be helpful.  Clinical correlation is advised.   Metta Clines, DO

## 2021-03-16 DIAGNOSIS — F4321 Adjustment disorder with depressed mood: Secondary | ICD-10-CM | POA: Diagnosis not present

## 2021-03-16 DIAGNOSIS — I10 Essential (primary) hypertension: Secondary | ICD-10-CM | POA: Diagnosis not present

## 2021-03-16 DIAGNOSIS — J111 Influenza due to unidentified influenza virus with other respiratory manifestations: Secondary | ICD-10-CM | POA: Diagnosis not present

## 2021-03-16 DIAGNOSIS — E785 Hyperlipidemia, unspecified: Secondary | ICD-10-CM | POA: Diagnosis not present

## 2021-03-16 DIAGNOSIS — J4520 Mild intermittent asthma, uncomplicated: Secondary | ICD-10-CM | POA: Diagnosis not present

## 2021-03-16 DIAGNOSIS — R52 Pain, unspecified: Secondary | ICD-10-CM | POA: Diagnosis not present

## 2021-03-16 DIAGNOSIS — K219 Gastro-esophageal reflux disease without esophagitis: Secondary | ICD-10-CM | POA: Diagnosis not present

## 2021-04-06 DIAGNOSIS — M79641 Pain in right hand: Secondary | ICD-10-CM | POA: Diagnosis not present

## 2021-04-06 DIAGNOSIS — J019 Acute sinusitis, unspecified: Secondary | ICD-10-CM | POA: Diagnosis not present

## 2021-04-06 DIAGNOSIS — I10 Essential (primary) hypertension: Secondary | ICD-10-CM | POA: Diagnosis not present

## 2021-04-07 DIAGNOSIS — J301 Allergic rhinitis due to pollen: Secondary | ICD-10-CM | POA: Diagnosis not present

## 2021-04-20 DIAGNOSIS — S82892A Other fracture of left lower leg, initial encounter for closed fracture: Secondary | ICD-10-CM | POA: Diagnosis not present

## 2021-04-20 DIAGNOSIS — K219 Gastro-esophageal reflux disease without esophagitis: Secondary | ICD-10-CM | POA: Diagnosis not present

## 2021-04-20 DIAGNOSIS — J309 Allergic rhinitis, unspecified: Secondary | ICD-10-CM | POA: Diagnosis not present

## 2021-04-20 DIAGNOSIS — Z881 Allergy status to other antibiotic agents status: Secondary | ICD-10-CM | POA: Diagnosis not present

## 2021-04-20 DIAGNOSIS — Z7901 Long term (current) use of anticoagulants: Secondary | ICD-10-CM | POA: Diagnosis not present

## 2021-04-20 DIAGNOSIS — I10 Essential (primary) hypertension: Secondary | ICD-10-CM | POA: Diagnosis not present

## 2021-04-20 DIAGNOSIS — S82842A Displaced bimalleolar fracture of left lower leg, initial encounter for closed fracture: Secondary | ICD-10-CM | POA: Diagnosis not present

## 2021-04-20 DIAGNOSIS — Z4789 Encounter for other orthopedic aftercare: Secondary | ICD-10-CM | POA: Diagnosis not present

## 2021-04-20 DIAGNOSIS — I6389 Other cerebral infarction: Secondary | ICD-10-CM | POA: Diagnosis not present

## 2021-04-20 DIAGNOSIS — R519 Headache, unspecified: Secondary | ICD-10-CM | POA: Diagnosis not present

## 2021-04-20 DIAGNOSIS — K311 Adult hypertrophic pyloric stenosis: Secondary | ICD-10-CM | POA: Diagnosis not present

## 2021-04-20 DIAGNOSIS — R0602 Shortness of breath: Secondary | ICD-10-CM | POA: Diagnosis not present

## 2021-04-20 DIAGNOSIS — Z9181 History of falling: Secondary | ICD-10-CM | POA: Diagnosis not present

## 2021-04-20 DIAGNOSIS — R1084 Generalized abdominal pain: Secondary | ICD-10-CM | POA: Diagnosis not present

## 2021-04-20 DIAGNOSIS — M6281 Muscle weakness (generalized): Secondary | ICD-10-CM | POA: Diagnosis not present

## 2021-04-20 DIAGNOSIS — M7989 Other specified soft tissue disorders: Secondary | ICD-10-CM | POA: Diagnosis not present

## 2021-04-20 DIAGNOSIS — S82842D Displaced bimalleolar fracture of left lower leg, subsequent encounter for closed fracture with routine healing: Secondary | ICD-10-CM | POA: Diagnosis not present

## 2021-04-20 DIAGNOSIS — Z20822 Contact with and (suspected) exposure to covid-19: Secondary | ICD-10-CM | POA: Diagnosis not present

## 2021-04-20 DIAGNOSIS — R2689 Other abnormalities of gait and mobility: Secondary | ICD-10-CM | POA: Diagnosis not present

## 2021-04-20 DIAGNOSIS — Z96641 Presence of right artificial hip joint: Secondary | ICD-10-CM | POA: Diagnosis not present

## 2021-04-20 DIAGNOSIS — M25572 Pain in left ankle and joints of left foot: Secondary | ICD-10-CM | POA: Diagnosis not present

## 2021-04-20 DIAGNOSIS — F330 Major depressive disorder, recurrent, mild: Secondary | ICD-10-CM | POA: Diagnosis not present

## 2021-04-20 DIAGNOSIS — E785 Hyperlipidemia, unspecified: Secondary | ICD-10-CM | POA: Diagnosis not present

## 2021-04-20 DIAGNOSIS — R55 Syncope and collapse: Secondary | ICD-10-CM | POA: Diagnosis not present

## 2021-04-20 DIAGNOSIS — E871 Hypo-osmolality and hyponatremia: Secondary | ICD-10-CM | POA: Diagnosis not present

## 2021-04-20 DIAGNOSIS — R262 Difficulty in walking, not elsewhere classified: Secondary | ICD-10-CM | POA: Diagnosis not present

## 2021-04-20 DIAGNOSIS — Z90710 Acquired absence of both cervix and uterus: Secondary | ICD-10-CM | POA: Diagnosis not present

## 2021-04-20 DIAGNOSIS — M62838 Other muscle spasm: Secondary | ICD-10-CM | POA: Diagnosis not present

## 2021-04-21 DIAGNOSIS — S82842A Displaced bimalleolar fracture of left lower leg, initial encounter for closed fracture: Secondary | ICD-10-CM | POA: Diagnosis not present

## 2021-04-21 DIAGNOSIS — I10 Essential (primary) hypertension: Secondary | ICD-10-CM | POA: Diagnosis not present

## 2021-04-21 DIAGNOSIS — E871 Hypo-osmolality and hyponatremia: Secondary | ICD-10-CM | POA: Diagnosis not present

## 2021-04-21 DIAGNOSIS — R55 Syncope and collapse: Secondary | ICD-10-CM | POA: Diagnosis not present

## 2021-04-22 DIAGNOSIS — S82842A Displaced bimalleolar fracture of left lower leg, initial encounter for closed fracture: Secondary | ICD-10-CM | POA: Diagnosis not present

## 2021-04-22 DIAGNOSIS — E871 Hypo-osmolality and hyponatremia: Secondary | ICD-10-CM | POA: Diagnosis not present

## 2021-04-22 DIAGNOSIS — R55 Syncope and collapse: Secondary | ICD-10-CM | POA: Diagnosis not present

## 2021-04-22 DIAGNOSIS — I10 Essential (primary) hypertension: Secondary | ICD-10-CM | POA: Diagnosis not present

## 2021-04-23 DIAGNOSIS — R55 Syncope and collapse: Secondary | ICD-10-CM | POA: Diagnosis not present

## 2021-04-23 DIAGNOSIS — I10 Essential (primary) hypertension: Secondary | ICD-10-CM | POA: Diagnosis not present

## 2021-04-23 DIAGNOSIS — E871 Hypo-osmolality and hyponatremia: Secondary | ICD-10-CM | POA: Diagnosis not present

## 2021-04-23 DIAGNOSIS — S82842A Displaced bimalleolar fracture of left lower leg, initial encounter for closed fracture: Secondary | ICD-10-CM | POA: Diagnosis not present

## 2021-04-24 DIAGNOSIS — E871 Hypo-osmolality and hyponatremia: Secondary | ICD-10-CM | POA: Diagnosis not present

## 2021-04-24 DIAGNOSIS — S82842A Displaced bimalleolar fracture of left lower leg, initial encounter for closed fracture: Secondary | ICD-10-CM | POA: Diagnosis not present

## 2021-04-24 DIAGNOSIS — I10 Essential (primary) hypertension: Secondary | ICD-10-CM | POA: Diagnosis not present

## 2021-04-24 DIAGNOSIS — R55 Syncope and collapse: Secondary | ICD-10-CM | POA: Diagnosis not present

## 2021-04-25 DIAGNOSIS — I10 Essential (primary) hypertension: Secondary | ICD-10-CM | POA: Diagnosis not present

## 2021-04-25 DIAGNOSIS — R2689 Other abnormalities of gait and mobility: Secondary | ICD-10-CM | POA: Diagnosis not present

## 2021-04-25 DIAGNOSIS — S82892A Other fracture of left lower leg, initial encounter for closed fracture: Secondary | ICD-10-CM | POA: Diagnosis not present

## 2021-04-25 DIAGNOSIS — S82842A Displaced bimalleolar fracture of left lower leg, initial encounter for closed fracture: Secondary | ICD-10-CM | POA: Diagnosis not present

## 2021-04-25 DIAGNOSIS — Z96641 Presence of right artificial hip joint: Secondary | ICD-10-CM | POA: Diagnosis not present

## 2021-04-25 DIAGNOSIS — F413 Other mixed anxiety disorders: Secondary | ICD-10-CM | POA: Diagnosis not present

## 2021-04-25 DIAGNOSIS — K219 Gastro-esophageal reflux disease without esophagitis: Secondary | ICD-10-CM | POA: Diagnosis not present

## 2021-04-25 DIAGNOSIS — M62838 Other muscle spasm: Secondary | ICD-10-CM | POA: Diagnosis not present

## 2021-04-25 DIAGNOSIS — F330 Major depressive disorder, recurrent, mild: Secondary | ICD-10-CM | POA: Diagnosis not present

## 2021-04-25 DIAGNOSIS — Z7901 Long term (current) use of anticoagulants: Secondary | ICD-10-CM | POA: Diagnosis not present

## 2021-04-25 DIAGNOSIS — S82842D Displaced bimalleolar fracture of left lower leg, subsequent encounter for closed fracture with routine healing: Secondary | ICD-10-CM | POA: Diagnosis not present

## 2021-04-25 DIAGNOSIS — R55 Syncope and collapse: Secondary | ICD-10-CM | POA: Diagnosis not present

## 2021-04-25 DIAGNOSIS — K5900 Constipation, unspecified: Secondary | ICD-10-CM | POA: Diagnosis not present

## 2021-04-25 DIAGNOSIS — M6281 Muscle weakness (generalized): Secondary | ICD-10-CM | POA: Diagnosis not present

## 2021-04-25 DIAGNOSIS — E785 Hyperlipidemia, unspecified: Secondary | ICD-10-CM | POA: Diagnosis not present

## 2021-04-25 DIAGNOSIS — Z4789 Encounter for other orthopedic aftercare: Secondary | ICD-10-CM | POA: Diagnosis not present

## 2021-04-25 DIAGNOSIS — J309 Allergic rhinitis, unspecified: Secondary | ICD-10-CM | POA: Diagnosis not present

## 2021-04-25 DIAGNOSIS — E871 Hypo-osmolality and hyponatremia: Secondary | ICD-10-CM | POA: Diagnosis not present

## 2021-04-25 DIAGNOSIS — Z9181 History of falling: Secondary | ICD-10-CM | POA: Diagnosis not present

## 2021-04-25 DIAGNOSIS — R262 Difficulty in walking, not elsewhere classified: Secondary | ICD-10-CM | POA: Diagnosis not present

## 2021-04-27 DIAGNOSIS — I10 Essential (primary) hypertension: Secondary | ICD-10-CM | POA: Diagnosis not present

## 2021-04-27 DIAGNOSIS — E871 Hypo-osmolality and hyponatremia: Secondary | ICD-10-CM | POA: Diagnosis not present

## 2021-04-27 DIAGNOSIS — R55 Syncope and collapse: Secondary | ICD-10-CM | POA: Diagnosis not present

## 2021-04-27 DIAGNOSIS — K5900 Constipation, unspecified: Secondary | ICD-10-CM | POA: Diagnosis not present

## 2021-04-27 DIAGNOSIS — S82892A Other fracture of left lower leg, initial encounter for closed fracture: Secondary | ICD-10-CM | POA: Diagnosis not present

## 2021-04-28 DIAGNOSIS — F413 Other mixed anxiety disorders: Secondary | ICD-10-CM | POA: Diagnosis not present

## 2021-05-02 DIAGNOSIS — M25572 Pain in left ankle and joints of left foot: Secondary | ICD-10-CM | POA: Diagnosis not present

## 2021-05-05 DIAGNOSIS — J4520 Mild intermittent asthma, uncomplicated: Secondary | ICD-10-CM | POA: Diagnosis not present

## 2021-05-05 DIAGNOSIS — I10 Essential (primary) hypertension: Secondary | ICD-10-CM | POA: Diagnosis not present

## 2021-05-05 DIAGNOSIS — E785 Hyperlipidemia, unspecified: Secondary | ICD-10-CM | POA: Diagnosis not present

## 2021-05-05 DIAGNOSIS — F4321 Adjustment disorder with depressed mood: Secondary | ICD-10-CM | POA: Diagnosis not present

## 2021-05-05 DIAGNOSIS — K219 Gastro-esophageal reflux disease without esophagitis: Secondary | ICD-10-CM | POA: Diagnosis not present

## 2021-05-06 DIAGNOSIS — M25572 Pain in left ankle and joints of left foot: Secondary | ICD-10-CM | POA: Diagnosis not present

## 2021-05-16 ENCOUNTER — Telehealth: Payer: Self-pay | Admitting: Neurology

## 2021-05-16 NOTE — Telephone Encounter (Signed)
Per pt this happened on Jan.4th, Per pt she fainted a week and half ago.   Per Pt she was advised by the [facility] hospital staff she was out for about 20 minutes.   Per she is doing okay now. She not able to bear weight on her left ankle for a while.

## 2021-05-16 NOTE — Telephone Encounter (Signed)
Tried calling pt back, Per DR.Jaffe please request records from [place].

## 2021-05-16 NOTE — Telephone Encounter (Signed)
Per pt please mail records request to her home address on file.    Record request to go out tomorrow to the patient address on file.

## 2021-05-16 NOTE — Telephone Encounter (Signed)
Patient was in [place] and fainted in the elevator. She crushed  her left ankle. Was in hospital for 5 days, then transferred to rehab. She is back home now. Her PCP wanted her to make sure jaffe was aware of this.

## 2021-05-16 NOTE — Telephone Encounter (Signed)
Patient called back, received a voicemail.

## 2021-05-18 DIAGNOSIS — R7309 Other abnormal glucose: Secondary | ICD-10-CM | POA: Diagnosis not present

## 2021-05-18 DIAGNOSIS — S82899A Other fracture of unspecified lower leg, initial encounter for closed fracture: Secondary | ICD-10-CM | POA: Diagnosis not present

## 2021-05-18 DIAGNOSIS — R55 Syncope and collapse: Secondary | ICD-10-CM | POA: Diagnosis not present

## 2021-05-23 DIAGNOSIS — J301 Allergic rhinitis due to pollen: Secondary | ICD-10-CM | POA: Diagnosis not present

## 2021-05-24 ENCOUNTER — Ambulatory Visit: Payer: PRIVATE HEALTH INSURANCE | Admitting: Neurology

## 2021-06-01 DIAGNOSIS — M25572 Pain in left ankle and joints of left foot: Secondary | ICD-10-CM | POA: Diagnosis not present

## 2021-06-03 DIAGNOSIS — M25572 Pain in left ankle and joints of left foot: Secondary | ICD-10-CM | POA: Diagnosis not present

## 2021-06-07 DIAGNOSIS — R262 Difficulty in walking, not elsewhere classified: Secondary | ICD-10-CM | POA: Diagnosis not present

## 2021-06-07 DIAGNOSIS — R531 Weakness: Secondary | ICD-10-CM | POA: Diagnosis not present

## 2021-06-07 DIAGNOSIS — M25672 Stiffness of left ankle, not elsewhere classified: Secondary | ICD-10-CM | POA: Diagnosis not present

## 2021-06-09 DIAGNOSIS — M25672 Stiffness of left ankle, not elsewhere classified: Secondary | ICD-10-CM | POA: Diagnosis not present

## 2021-06-09 DIAGNOSIS — R262 Difficulty in walking, not elsewhere classified: Secondary | ICD-10-CM | POA: Diagnosis not present

## 2021-06-09 DIAGNOSIS — R531 Weakness: Secondary | ICD-10-CM | POA: Diagnosis not present

## 2021-06-15 DIAGNOSIS — R262 Difficulty in walking, not elsewhere classified: Secondary | ICD-10-CM | POA: Diagnosis not present

## 2021-06-15 DIAGNOSIS — M25672 Stiffness of left ankle, not elsewhere classified: Secondary | ICD-10-CM | POA: Diagnosis not present

## 2021-06-15 DIAGNOSIS — R531 Weakness: Secondary | ICD-10-CM | POA: Diagnosis not present

## 2021-06-16 DIAGNOSIS — R262 Difficulty in walking, not elsewhere classified: Secondary | ICD-10-CM | POA: Diagnosis not present

## 2021-06-16 DIAGNOSIS — R531 Weakness: Secondary | ICD-10-CM | POA: Diagnosis not present

## 2021-06-16 DIAGNOSIS — M25672 Stiffness of left ankle, not elsewhere classified: Secondary | ICD-10-CM | POA: Diagnosis not present

## 2021-06-19 NOTE — Progress Notes (Addendum)
Cardiology Office Note:    Date:  06/20/2021   ID:  Danielle Garrett, DOB 1950/10/26, MRN [id number]  PCP:  London Pepper, MD  Cardiologist:  None   Referring MD: London Pepper, MD   Chief Complaint  Patient presents with   Loss of Consciousness   Hypertension    History of Present Illness:    Danielle Garrett is a 71 y.o. female with a hx of LBBB, primary hypertension and syncope previously evaluated by Dr. [name]. Had Loop recorder placed 2018 and removed 2020..  Greater than 20-year history of near syncope associated with prodrome of nausea, weakness, and diaphoresis.  Was seen by Dr. Beckie Salts and had loop recorder implanted in 2018.  During the active monitoring with a loop recorder, no episodes of syncope occurred.  Over the past year or 2 she has begun to have episodes of near syncope.  These episodes are with prodrome.  Over the past 6 weeks she has had a dramatic increase in episodes.  In early January she was in [place] to help a friend recover from knee replacement surgery.  On the day of her friend's operation which was April 20, 2021 she developed typical prodrome shortly after they announced that her friend had successful surgery.  She was trying to go to her patient's room when she began feeling somewhat nauseated, diaphoretic, weak, and presyncopal.  Because of the circumstances, she did not respond to the prodrome as she has over the past 20 years but tried to tough it out.  In the elevator, she collapsed, shattered her left ankle, and had to be admitted to the hospital for surgery.  While hospitalized she was noted to have rapid heart rates on telemetry but no excessive episodes of bradycardia.  Subsequent to the syncopal episode on January 4, she had an episode of syncope while sitting at [place] on May 02, 2021.  States that she had very brief prodrome before she fainted.  About 1 week ago while sitting on the toilet talking to one of her  friends, she began feeling faint and had rapid progression but did not lose consciousness.  She becomes tearful when she talks about this.  She is afraid to drive.  She feels she is losing her autonomy.  She begs [place] to find a way to stabilize her current situation.  Past Medical History:  Diagnosis Date   Allergic rhinitis due to other allergen    Anginal pain ([place]) 2011   hospitalized for CP in the past; due to low potassium   Anxiety    Arthritis    Childhood asthma    Chronic hoarseness    Gastroparesis    GERD (gastroesophageal reflux disease)    History of hiatal hernia    Hx of blood transfusion reaction    in the early 80s   Hypertension    Left bundle branch block     (abnormal heart rhythm)   PONV (postoperative nausea and vomiting)    Pyloric stenosis     Past Surgical History:  Procedure Laterality Date   [place]  10/2009   Cardiac cath normal coronary arteries   CERVICAL DISC ARTHROPLASTY     disc replacement in neck 1998   COLONOSCOPY     10 year repeat 09/26/2010   ESOPHAGOGASTRODUODENOSCOPY  09/26/2010,01/08/13   LOOP RECORDER INSERTION N/A 05/29/2016   Procedure: Loop Recorder Insertion;  Surgeon: Evans Lance, MD;  Location:  [facility];  Service: Cardiovascular;  Laterality: N/A;   OTHER SURGICAL HISTORY  1983   hysterectomy ovaries intact, prolapsed    OTHER SURGICAL HISTORY  2006   pyloric stricture surgery   TOTAL HIP ARTHROPLASTY Right 06/07/2015   Procedure: TOTAL HIP ARTHROPLASTY ANTERIOR APPROACH;  Surgeon: Frederik Pear, MD;  Location: [facility];  Service: Orthopedics;  Laterality: Right;    Current Medications: Current Meds  Medication Sig   albuterol (PROVENTIL HFA;VENTOLIN HFA) 108 (90 Base) MCG/ACT inhaler Inhale 1-2 puffs into the lungs every 4 (four) hours as needed for wheezing or shortness of breath.   aspirin EC 81 MG tablet Take 81 mg by mouth daily.   FLUoxetine (PROZAC) 10 MG capsule  Take 10 mg by mouth daily.   fluticasone (FLONASE) 50 MCG/ACT nasal spray Place 2 sprays into both nostrils as needed for allergies or rhinitis.   losartan (COZAAR) 100 MG tablet Take 100 mg by mouth daily.   Multiple Vitamins-Minerals (CENTRUM SILVER 50+WOMEN PO) Take 1 tablet by mouth daily.   pantoprazole (PROTONIX) 40 MG tablet Take 40 mg by mouth 2 (two) times daily.   rosuvastatin (CRESTOR) 10 MG tablet Take 10 mg by mouth 3 (three) times a week.   sucralfate (CARAFATE) 1 g tablet Take 1 g by mouth 2 (two) times daily as needed (Takes if Pantoprazole is not working).    VITAMIN D PO Take 1 tablet by mouth daily.     Allergies:   Hydrochlorothiazide, Penicillins, Adhesive [tape], Codeine, Sulfa antibiotics, and Vicodin [hydrocodone-acetaminophen]   Social History   Socioeconomic History   Marital status: Married    Spouse name: Not on file   Number of children: Not on file   Years of education: Not on file   Highest education level: Not on file  Occupational History   Not on file  Tobacco Use   Smoking status: Never   Smokeless tobacco: Never  Vaping Use   Vaping Use: Never used  Substance and Sexual Activity   Alcohol use: No    Alcohol/week: 0.0 standard drinks   Drug use: No   Sexual activity: Not on file  Other Topics Concern   Not on file  Social History Narrative   ** Merged History Encounter **    Right handed    Social Determinants of Health   Financial Resource Strain: Not on file  Food Insecurity: Not on file  Transportation Needs: Not on file  Physical Activity: Not on file  Stress: Not on file  Social Connections: Not on file     Family History: The patient's family history includes Cirrhosis in her father; Depression in her sister and sister; Hypertension in her father, mother, and sister; OCD in her sister.  ROS:   Please see the history of present illness.    Has a history of hypertension.  She takes amlodipine and Cozaar.  While hospitalized  she was noted to have low potassium and low sodium.  She is on Crestor 3 times per week.  She had an echocardiogram performed at [facility] in [place].  She denies chest pain.  She has had some dyspnea.  All other systems reviewed and are negative.  EKGs/Labs/Other Studies Reviewed:    The following studies were reviewed today: 2D Doppler echocardiogram done while hospitalized and January 2023 at [facility] in [place]: Demonstrated normal left ventricular cavity size, LVEF 55 to 60%, wall motion normal, and grade 1 diastolic dysfunction.  There was  mild mitral regurgitation and moderate pulmonary regurgitation.  Estimated PA systolic pressure 54 mmHg. According to the patient, multiple episodes of SVT while being monitored in [place].  No mention of bradycardia.  EKG:  EKG sinus rhythm, biatrial abnormality, left bundle branch block with QRS D1 116 ms, left axis deviation.  Compared to prior EKG performed.  When compared to 12/02/2017, QRS D on the current tracing is shorter.  Recent Labs: No results found for requested labs within last 8760 hours.  Recent Lipid Panel    Component Value Date/Time   CHOL (H) 11/09/2009 0215    216        ATP III CLASSIFICATION:  <200     mg/dL   Desirable  200-239  mg/dL   Borderline High  >=240    mg/dL   High          TRIG 71 11/09/2009 0215   HDL 69 11/09/2009 0215   CHOLHDL 3.1 11/09/2009 0215   VLDL 14 11/09/2009 0215   LDLCALC (H) 11/09/2009 0215    133        Total Cholesterol/HDL:CHD Risk Coronary Heart Disease Risk Table                     Men   Women  1/2 Average Risk   3.4   3.3  Average Risk       5.0   4.4  2 X Average Risk   9.6   7.1  3 X Average Risk  23.4   11.0        Use the calculated Patient Ratio above and the CHD Risk Table to determine the patient's CHD Risk.        ATP III CLASSIFICATION (LDL):  <100     mg/dL   Optimal  100-129  mg/dL   Near or Above                    Optimal   130-159  mg/dL   Borderline  160-189  mg/dL   High  >190     mg/dL   Very High    Physical Exam:    VS:  BP 132/70    Pulse 84    Ht 5' (1.524 m)    Wt 115 lb (52.2 kg)    SpO2 99%    BMI 22.46 kg/m     Wt Readings from Last 3 Encounters:  06/20/21 115 lb (52.2 kg)  02/23/21 117 lb 6.4 oz (53.3 kg)  11/24/19 119 lb 8 oz (54.2 kg)     GEN: Slender, no distress.. No acute distress HEENT: Normal NECK: No JVD. LYMPHATICS: No lymphadenopathy CARDIAC: No murmur. RRR no gallop, or edema. VASCULAR:  Normal Pulses. No bruits. RESPIRATORY:  Clear to auscultation without rales, wheezing or rhonchi  ABDOMEN: Soft, non-tender, non-distended, No pulsatile mass, MUSCULOSKELETAL: No deformity  SKIN: Warm and dry NEUROLOGIC:  Alert and oriented x 3 PSYCHIATRIC:  Normal affect   ASSESSMENT:    1. Syncope, cardiogenic   2. Atrial fibrillation with tachycardic ventricular rate ([place])   3. LBBB (left bundle branch block)   4. Tachycardia-bradycardia syndrome ([place])    PLAN:    In order of problems listed above:  Neurocardiogenic syncope plus or minus superimposed bradycardia tachycardia syndrome.  30-day monitor.  Escalate to implantable loop recorder if nothing is found on the 30-day monitor.  Clinical follow-up in 6 to 8 weeks.  No driving in the interval. This is a  historical diagnosis.  The monitor will help [place] sort this out. Need to exclude high-grade AV block superimposed on an underlying tendency to have neurally mediated syncope. Suspected.  Prolonged monitoring will hopefully allow [place] to catch an episode.   Medication Adjustments/Labs and Tests Ordered: Current medicines are reviewed at length with the patient today.  Concerns regarding medicines are outlined above.  No orders of the defined types were placed in this encounter.  No orders of the defined types were placed in this encounter.   Patient Instructions  Medication Instructions:  Your physician recommends that you  continue on your current medications as directed. Please refer to the Current Medication list given to you today.  *If you need a refill on your cardiac medications before your next appointment, please call your pharmacy*   Lab Work: None today If you have labs (blood work) drawn today and your tests are completely normal, you will receive your results only by: [place] (if you have MyChart) OR A paper copy in the mail If you have any lab test that is abnormal or we need to change your treatment, we will call you to review the results.   Testing/Procedures: 30 day monitor. ZIO XT- Long Term Monitor Instructions  Your physician has requested you wear a ZIO patch monitor for 14 days.  This is a single patch monitor. [employer] supplies one patch monitor per enrollment. Additional stickers are not available. Please do not apply patch if you will be having a Nuclear Stress Test,  Echocardiogram, Cardiac CT, MRI, or Chest Xray during the period you would be wearing the  monitor. The patch cannot be worn during these tests. You cannot remove and re-apply the  ZIO XT patch monitor.  Your ZIO patch monitor will be mailed 3 day USPS to your address on file. It may take 3-5 days  to receive your monitor after you have been enrolled.  Once you have received your monitor, please review the enclosed instructions. Your monitor  has already been registered assigning a specific monitor serial # to you.  Billing and Patient Assistance Program Information  We have supplied [employer] with any of your insurance information on file for billing purposes. [employer] offers a sliding scale Patient Assistance Program for patients that do not have  insurance, or whose insurance does not completely cover the cost of the ZIO monitor.  You must apply for the Patient Assistance Program to qualify for this discounted rate.  To apply, please call [employer] at [phone], select option 4, select option 2, ask to  apply for  Patient Assistance Program. Theodore Demark will ask your household income, and how many people  are in your household. They will quote your out-of-pocket cost based on that information.  [employer] will also be able to set up a 42-month interest-free payment plan if needed.  Applying the monitor   Shave hair from upper left chest.  Hold abrader disc by orange tab. Rub abrader in 40 strokes over the upper left chest as  indicated in your monitor instructions.  Clean area with 4 enclosed alcohol pads. Let dry.  Apply patch as indicated in monitor instructions. Patch will be placed under collarbone on left  side of chest with arrow pointing upward.  Rub patch adhesive wings for 2 minutes. Remove white label marked "1". Remove the white  label marked "2". Rub patch adhesive wings for 2 additional minutes.  While looking in a mirror, press and release button in center of patch. A small  green light will  flash 3-4 times. This will be your only indicator that the monitor has been turned on.  Do not shower for the first 24 hours. You may shower after the first 24 hours.  Press the button if you feel a symptom. You will hear a small click. Record Date, Time and  Symptom in the Patient Logbook.  When you are ready to remove the patch, follow instructions on the last 2 pages of Patient  Logbook. Stick patch monitor onto the last page of Patient Logbook.  Place Patient Logbook in the blue and white box. Use locking tab on box and tape box closed  securely. The blue and white box has prepaid postage on it. Please place it in the mailbox as  soon as possible. Your physician should have your test results approximately 7 days after the  monitor has been mailed back to [facility].  Call [facility] at [phone] if you have questions regarding  your ZIO XT patch monitor. Call them immediately if you see an orange light blinking on your  monitor.  If your monitor falls off in  less than 4 days, contact our Monitor department at [phone].  If your monitor becomes loose or falls off after 4 days call [employer] at [phone] for  suggestions on securing your monitor    Follow-Up: At [facility], you and your health needs are our priority.  As part of our continuing mission to provide you with exceptional heart care, we have created designated Provider Care Teams.  These Care Teams include your primary Cardiologist (physician) and Advanced Practice Providers (APPs -  Physician Assistants and Nurse Practitioners) who all work together to provide you with the care you need, when you need it.  We recommend signing up for the patient portal called "MyChart".  Sign up information is provided on this After Visit Summary.  MyChart is used to connect with patients for Virtual Visits (Telemedicine).  Patients are able to view lab/test results, encounter notes, upcoming appointments, etc.  Non-urgent messages can be sent to your provider as well.   To learn more about what you can do with MyChart, go to [email or web address].    Your next appointment:   6 week(s) to 8 weeks  The format for your next appointment:   In Person  Provider:   None     Other Instructions: Do not drive until instructed to by Dr. Tamala Julian   Signed, Sinclair Grooms, MD  06/20/2021 10:36 AM    [place]

## 2021-06-20 ENCOUNTER — Other Ambulatory Visit: Payer: Self-pay

## 2021-06-20 ENCOUNTER — Encounter: Payer: Self-pay | Admitting: Interventional Cardiology

## 2021-06-20 ENCOUNTER — Encounter: Payer: Self-pay | Admitting: Radiology

## 2021-06-20 ENCOUNTER — Ambulatory Visit (INDEPENDENT_AMBULATORY_CARE_PROVIDER_SITE_OTHER): Payer: Medicare Other | Admitting: Interventional Cardiology

## 2021-06-20 VITALS — BP 132/70 | HR 84 | Ht 60.0 in | Wt 115.0 lb

## 2021-06-20 DIAGNOSIS — I495 Sick sinus syndrome: Secondary | ICD-10-CM | POA: Diagnosis not present

## 2021-06-20 DIAGNOSIS — R262 Difficulty in walking, not elsewhere classified: Secondary | ICD-10-CM | POA: Diagnosis not present

## 2021-06-20 DIAGNOSIS — M25672 Stiffness of left ankle, not elsewhere classified: Secondary | ICD-10-CM | POA: Diagnosis not present

## 2021-06-20 DIAGNOSIS — R55 Syncope and collapse: Secondary | ICD-10-CM | POA: Diagnosis not present

## 2021-06-20 DIAGNOSIS — I4891 Unspecified atrial fibrillation: Secondary | ICD-10-CM

## 2021-06-20 DIAGNOSIS — I447 Left bundle-branch block, unspecified: Secondary | ICD-10-CM | POA: Diagnosis not present

## 2021-06-20 DIAGNOSIS — R531 Weakness: Secondary | ICD-10-CM | POA: Diagnosis not present

## 2021-06-20 NOTE — Patient Instructions (Addendum)
Medication Instructions:  ?Your physician recommends that you continue on your current medications as directed. Please refer to the Current Medication list given to you today. ? ?*If you need a refill on your cardiac medications before your next appointment, please call your pharmacy* ? ? ?Lab Work: ?None today ?If you have labs (blood work) drawn today and your tests are completely normal, you will receive your results only by: ?MyChart Message (if you have MyChart) OR ?A paper copy in the mail ?If you have any lab test that is abnormal or we need to change your treatment, we will call you to review the results. ? ? ?Testing/Procedures: ?Your physician has recommended that you wear an event monitor. Event monitors are medical devices that record the heart?s electrical activity. Doctors most often [place] these monitors to diagnose arrhythmias. Arrhythmias are problems with the speed or rhythm of the heartbeat. The monitor is a small, portable device. You can wear one while you do your normal daily activities. This is usually used to diagnose what is causing palpitations/syncope (passing out). ? ?ZIO XT- Long Term Monitor Instructions ? ?Your physician has requested you wear a ZIO patch monitor for 14 days.  ?This is a single patch monitor. [employer] supplies one patch monitor per enrollment. Additional ?stickers are not available. Please do not apply patch if you will be having a Nuclear Stress Test,  ?Echocardiogram, Cardiac CT, MRI, or Chest Xray during the period you would be wearing the  ?monitor. The patch cannot be worn during these tests. You cannot remove and re-apply the  ?ZIO XT patch monitor.  ?Your ZIO patch monitor will be mailed 3 day USPS to your address on file. It may take 3-5 days  ?to receive your monitor after you have been enrolled.  ?Once you have received your monitor, please review the enclosed instructions. Your monitor  ?has already been registered assigning a specific monitor serial # to  you. ? ?Billing and Patient Assistance Program Information ? ?We have supplied [employer] with any of your insurance information on file for billing purposes. ?[employer] offers a sliding scale Patient Assistance Program for patients that do not have  ?insurance, or whose insurance does not completely cover the cost of the ZIO monitor.  ?You must apply for the Patient Assistance Program to qualify for this discounted rate.  ?To apply, please call [employer] at [phone], select option 4, select option 2, ask to apply for  ?Patient Assistance Program. Theodore Demark will ask your household income, and how many people  ?are in your household. They will quote your out-of-pocket cost based on that information.  ?[employer] will also be able to set up a 9-month interest-free payment plan if needed. ? ?Applying the monitor ?  ?Shave hair from upper left chest.  ?Hold abrader disc by orange tab. Rub abrader in 40 strokes over the upper left chest as  ?indicated in your monitor instructions.  ?Clean area with 4 enclosed alcohol pads. Let dry.  ?Apply patch as indicated in monitor instructions. Patch will be placed under collarbone on left  ?side of chest with arrow pointing upward.  ?Rub patch adhesive wings for 2 minutes. Remove white label marked "1". Remove the white  ?label marked "2". Rub patch adhesive wings for 2 additional minutes.  ?While looking in a mirror, press and release button in center of patch. A small green light will  ?flash 3-4 times. This will be your only indicator that the monitor has been turned on.  ?Do not shower  for the first 24 hours. You may shower after the first 24 hours.  ?Press the button if you feel a symptom. You will hear a small click. Record Date, Time and  ?Symptom in the Patient Logbook.  ?When you are ready to remove the patch, follow instructions on the last 2 pages of Patient  ?Logbook. Stick patch monitor onto the last page of Patient Logbook.  ?Place Patient Logbook in the blue and white box.  Use locking tab on box and tape box closed  ?securely. The blue and white box has prepaid postage on it. Please place it in the mailbox as  ?soon as possible. Your physician should have your test results approximately 7 days after the  ?monitor has been mailed back to [facility].  ?Call [facility] at [phone] if you have questions regarding  ?your ZIO XT patch monitor. Call them immediately if you see an orange light blinking on your  ?monitor.  ?If your monitor falls off in less than 4 days, contact our Monitor department at [phone].  ?If your monitor becomes loose or falls off after 4 days call [employer] at [phone] for  ?suggestions on securing your monitor  ? ? ?Follow-Up: ?At [facility], you and your health needs are our priority.  As part of our continuing mission to provide you with exceptional heart care, we have created designated Provider Care Teams.  These Care Teams include your primary Cardiologist (physician) and Advanced Practice Providers (APPs -  Physician Assistants and Nurse Practitioners) who all work together to provide you with the care you need, when you need it. ? ?We recommend signing up for the patient portal called "MyChart".  Sign up information is provided on this After Visit Summary.  MyChart is used to connect with patients for Virtual Visits (Telemedicine).  Patients are able to view lab/test results, encounter notes, upcoming appointments, etc.  Non-urgent messages can be sent to your provider as well.   ?To learn more about what you can do with MyChart, go to [email or web address].   ? ?Your next appointment:   ?6 week(s) to 8 weeks ? ?The format for your next appointment:   ?In Person ? ?Provider:   ?None   ? ? ?Other Instructions: ?Do not drive until instructed to by Dr. Tamala Julian ?

## 2021-06-20 NOTE — Progress Notes (Signed)
Enrolled patient for a 30 day [employer] Event Monitor to be mailed to patients home  

## 2021-06-23 DIAGNOSIS — M25672 Stiffness of left ankle, not elsewhere classified: Secondary | ICD-10-CM | POA: Diagnosis not present

## 2021-06-23 DIAGNOSIS — R531 Weakness: Secondary | ICD-10-CM | POA: Diagnosis not present

## 2021-06-23 DIAGNOSIS — R262 Difficulty in walking, not elsewhere classified: Secondary | ICD-10-CM | POA: Diagnosis not present

## 2021-06-27 ENCOUNTER — Ambulatory Visit (INDEPENDENT_AMBULATORY_CARE_PROVIDER_SITE_OTHER): Payer: Medicare Other

## 2021-06-27 DIAGNOSIS — R262 Difficulty in walking, not elsewhere classified: Secondary | ICD-10-CM | POA: Diagnosis not present

## 2021-06-27 DIAGNOSIS — M25672 Stiffness of left ankle, not elsewhere classified: Secondary | ICD-10-CM | POA: Diagnosis not present

## 2021-06-27 DIAGNOSIS — R531 Weakness: Secondary | ICD-10-CM | POA: Diagnosis not present

## 2021-06-27 DIAGNOSIS — R55 Syncope and collapse: Secondary | ICD-10-CM

## 2021-06-29 DIAGNOSIS — M25672 Stiffness of left ankle, not elsewhere classified: Secondary | ICD-10-CM | POA: Diagnosis not present

## 2021-06-29 DIAGNOSIS — R531 Weakness: Secondary | ICD-10-CM | POA: Diagnosis not present

## 2021-06-29 DIAGNOSIS — R262 Difficulty in walking, not elsewhere classified: Secondary | ICD-10-CM | POA: Diagnosis not present

## 2021-06-29 DIAGNOSIS — M25572 Pain in left ankle and joints of left foot: Secondary | ICD-10-CM | POA: Diagnosis not present

## 2021-07-06 DIAGNOSIS — R531 Weakness: Secondary | ICD-10-CM | POA: Diagnosis not present

## 2021-07-06 DIAGNOSIS — R262 Difficulty in walking, not elsewhere classified: Secondary | ICD-10-CM | POA: Diagnosis not present

## 2021-07-06 DIAGNOSIS — M25672 Stiffness of left ankle, not elsewhere classified: Secondary | ICD-10-CM | POA: Diagnosis not present

## 2021-07-11 DIAGNOSIS — M25672 Stiffness of left ankle, not elsewhere classified: Secondary | ICD-10-CM | POA: Diagnosis not present

## 2021-07-11 DIAGNOSIS — R531 Weakness: Secondary | ICD-10-CM | POA: Diagnosis not present

## 2021-07-11 DIAGNOSIS — R262 Difficulty in walking, not elsewhere classified: Secondary | ICD-10-CM | POA: Diagnosis not present

## 2021-07-13 DIAGNOSIS — R262 Difficulty in walking, not elsewhere classified: Secondary | ICD-10-CM | POA: Diagnosis not present

## 2021-07-13 DIAGNOSIS — R531 Weakness: Secondary | ICD-10-CM | POA: Diagnosis not present

## 2021-07-13 DIAGNOSIS — M25672 Stiffness of left ankle, not elsewhere classified: Secondary | ICD-10-CM | POA: Diagnosis not present

## 2021-07-14 DIAGNOSIS — R55 Syncope and collapse: Secondary | ICD-10-CM | POA: Diagnosis not present

## 2021-07-14 DIAGNOSIS — F418 Other specified anxiety disorders: Secondary | ICD-10-CM | POA: Diagnosis not present

## 2021-07-18 DIAGNOSIS — M25672 Stiffness of left ankle, not elsewhere classified: Secondary | ICD-10-CM | POA: Diagnosis not present

## 2021-07-18 DIAGNOSIS — R531 Weakness: Secondary | ICD-10-CM | POA: Diagnosis not present

## 2021-07-18 DIAGNOSIS — R262 Difficulty in walking, not elsewhere classified: Secondary | ICD-10-CM | POA: Diagnosis not present

## 2021-07-19 NOTE — Progress Notes (Signed)
? ?NEUROLOGY FOLLOW UP OFFICE NOTE ? ?Danielle Garrett ?[id number] ? ?Assessment/Plan:  ? ?Recurrent episodes of syncope and near syncope.  Majority of these spells are consistent with syncope.  The spell in January at the hospital was different in that she didn't collapse immediately, has no recollection of stepping into and standing in the elevator and was reportedly unresponsive for longer period of time.   ?Brief episodes of facial and right upper extremity paresthesias - TIA possible but not probable as they are habitual episodes several weeks apart, which would mean she had two TIAs involving the same part of the brain in absence of a correlating arterial stenosis.  However, she has already been worked up for TIA and is on maximum medical management. 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  ?1  If the cardiac event monitor was negative or if she didn't have an event, then we will perform a 72 hour ambulatory EEG.   ?2  In meantime, I agree with Dr. Tamala Julian that she should not be driving. ?3  Follow up in 2 months. ?  ?  ?Subjective:  ?Danielle Garrett is a 71 year old female with HTN, LBBB, arthritis and GERD who follows up for near-syncope and episodes of right sided facial and upper extremity paresthesias.  She is accompanied by her husband who supplements history. ? ?UPDATE: ?EEG on 02/28/2021 to further evaluate recurrent episodes of right sided numbness as well as near-syncope was normal.   ? ?Since last visit, she has had more severe episodes, now presenting as full syncope.  Her prodromes are now shorter, so she doesn't have a chance to stop and sit down to prevent from passing out.  On 04/20/2021 was in [place] visiting a friend in the hospital when she felt her typical prodrome and passed out in the elevator shattering her left ankle which required surgery.  She thinks she passed out because she didn't sit down once she felt the prodrome.  She remembers seeing the elevator open and the  next moment, she was on the floor.  A witness told her that she was standing with eyes open.  The witness asked her to press a button for a floor and she didn't respond.  She then started to fall and the witness was able to catch her in order to prevent head injury but she shattered her left ankle.  She was reportedly unresponsive for 30 minutes.  When she woke up while laying on the ground, she passed out again and woke up No reported convulsions, incontinence, tongue biting.  While in the hospital, she had episodes of tachycardia on telemetry but not bradycardia.  On 04/22/2021, she had another habitual brief episode of syncope while sitting in the waiting room of orthopedics.  Last month, she had a near-syncopal spell while sitting and talking to her friend on the phone  She followed up with Dr. Tamala Julian of cardiology.  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  She currently is wearing a 30 day cardiac event monitor.  No spells thus far.  It will be removed in about 10 days.  Plan is for another implantable loop recorder if event monitor does not capture a spell.  Dr. Tamala Julian told her that she should not drive.   ? ?  ?HISTORY: ?Since she was in her 27s, she has had transient episodes of near syncope.  It happens spontaneously. I happens with activity or with rest, standing or sitting.  She develops a sudden wave of fatigue and diffuse weakness with feeling clammy, sweaty and sick to her stomach.  Sometimes she may vomit.  When it occurs, she needs to immediately lay down or else she will fall and sometimes may pass out..  Symptoms of extreme fatigue and weakness will last the rest of the day.  No associated vertigo, headache, visual disturbance or palpitations.  She may have 2 in 3 months or she may have no events in a year.  She is followed by cardiology for LBBB and has had a cardiac workup, including a holter monitor but did not capture an event.  Possible  diagnoses suggested were vasovagal syncope or cardiogenic syncope, but really nobody knows the etiology of these events.   ?  ?Over the past year, she had two episode of numbness and tingling several weeks apart.  She describes a numbness and tingling involving her face and right arm lasting 15 minutes.  No headache, unilateral weakness, facial droop. ?  ?Beginning this year, she has had what has been diagnosed by the ophthalmologist as ocular migraines.  She will develop unilateral kaleidoscope vision in either eye, sometimes preceded by a dark scotoma, lasting about 10 minutes and followed by a dull headache.  She has had about 10 in an 8-9 month period.  She denies prior history of headaches.  ?  ?MRI of brain without contrast on 10/16/2020 personally reviewed showed moderate chronic small vessel ischemic changes in the cerebral white matter but no acute abnormalities.  MRA of head and neck were unremarkable.   ?  ?Labs from August include TSH 0.93, sed rate 10, B12 322 ? ?PAST MEDICAL HISTORY: ?Past Medical History:  ?Diagnosis Date  ? Allergic rhinitis due to other allergen   ? Anginal pain ([place]) 2011  ? hospitalized for CP in the past; due to low potassium  ? Anxiety   ? Arthritis   ? Childhood asthma   ? Chronic hoarseness   ? Gastroparesis   ? GERD (gastroesophageal reflux disease)   ? History of hiatal hernia   ? Hx of blood transfusion reaction   ? in the early 80s  ? Hypertension   ? Left bundle branch block   ?  (abnormal heart rhythm)  ? PONV (postoperative nausea and vomiting)   ? Pyloric stenosis   ? ? ?MEDICATIONS: ?Current Outpatient Medications on File Prior to Visit  ?Medication Sig Dispense Refill  ? albuterol (PROVENTIL HFA;VENTOLIN HFA) 108 (90 Base) MCG/ACT inhaler Inhale 1-2 puffs into the lungs every 4 (four) hours as needed for wheezing or shortness of breath.    ? amLODipine (NORVASC) 5 MG tablet Take 0.5 mg by mouth daily.    ? aspirin EC 81 MG tablet Take 81 mg by mouth daily.    ? b  complex vitamins tablet Take 1 tablet by mouth daily. (Patient not taking: Reported on 06/20/2021)    ? FLUoxetine (PROZAC) 10 MG capsule Take 10 mg by mouth daily.    ? fluticasone (FLONASE) 50 MCG/ACT nasal spray Place 2 sprays into both nostrils as needed for allergies or rhinitis.    ? loratadine (CLARITIN) 10 MG tablet Take 10 mg by mouth daily. (Patient not taking: Reported on 06/20/2021)    ? losartan (COZAAR) 100 MG tablet Take 100 mg by mouth daily.    ? Multiple Vitamins-Minerals (CENTRUM SILVER 50+WOMEN PO) Take 1 tablet by mouth daily.    ? pantoprazole (PROTONIX) 40 MG tablet Take 40 mg by mouth  2 (two) times daily.    ? rosuvastatin (CRESTOR) 10 MG tablet Take 10 mg by mouth 3 (three) times a week.    ? sucralfate (CARAFATE) 1 g tablet Take 1 g by mouth 2 (two) times daily as needed (Takes if Pantoprazole is not working).     ? VITAMIN D PO Take 1 tablet by mouth daily.    ? ?No current facility-administered medications on file prior to visit.  ? ? ?ALLERGIES: ?Allergies  ?Allergen Reactions  ? Hydrochlorothiazide Nausea Only  ? Penicillins Other (See Comments)  ?  Unknown ?Other reaction(s): Other (See Comments) ?Pt was a child  ? Adhesive [Tape]   ?  Pt states ' it took my skin off' ?Steri-Strips  ? Codeine Nausea And Vomiting  ? Sulfa Antibiotics Nausea And Vomiting  ? Vicodin [Hydrocodone-Acetaminophen] Nausea And Vomiting and Other (See Comments)  ?  Did not feel like herself  ? ? ?FAMILY HISTORY: ?Family History  ?Problem Relation Age of Onset  ? Hypertension Mother   ? Cirrhosis Father   ? Hypertension Father   ? OCD Sister   ? Depression Sister   ? Depression Sister   ? Hypertension Sister   ? ? ?  ?Objective:  ?Blood pressure 130/81, pulse 81, height 5' (1.524 m), weight 115 lb (52.2 kg), SpO2 99 %. ?General: No acute distress.  Patient appears well-groomed.   ?Head:  Normocephalic/atraumatic ?Eyes:  Fundi examined but not visualized ?Neurological Exam: alert and oriented to person, place, and  time.  Speech fluent and not dysarthric, language intact.  CN II-XII intact. Bulk and tone normal, muscle strength 5/5 throughout.  Sensation to light touch intact.  Deep tendon reflexes 2+ throughout.  Finger to

## 2021-07-20 ENCOUNTER — Encounter: Payer: Self-pay | Admitting: Neurology

## 2021-07-20 ENCOUNTER — Ambulatory Visit (INDEPENDENT_AMBULATORY_CARE_PROVIDER_SITE_OTHER): Payer: Medicare Other | Admitting: Neurology

## 2021-07-20 VITALS — BP 130/81 | HR 81 | Ht 60.0 in | Wt 115.0 lb

## 2021-07-20 DIAGNOSIS — R55 Syncope and collapse: Secondary | ICD-10-CM | POA: Diagnosis not present

## 2021-07-20 NOTE — Patient Instructions (Signed)
We will schedule for a 72 hour video eeg monitor to be set up tentatively in 2 weeks (to be scheduled after the cardiac monitor is removed).  If the cardiac monitor captured an event and was positive, contact me and we will cancel the EEG.   ?I agree with Dr. Tamala Julian that you cannot drive right now.   ?Follow up in 2 months. ?

## 2021-07-21 DIAGNOSIS — R262 Difficulty in walking, not elsewhere classified: Secondary | ICD-10-CM | POA: Diagnosis not present

## 2021-07-21 DIAGNOSIS — M25672 Stiffness of left ankle, not elsewhere classified: Secondary | ICD-10-CM | POA: Diagnosis not present

## 2021-07-21 DIAGNOSIS — R531 Weakness: Secondary | ICD-10-CM | POA: Diagnosis not present

## 2021-07-27 DIAGNOSIS — R051 Acute cough: Secondary | ICD-10-CM | POA: Diagnosis not present

## 2021-07-27 DIAGNOSIS — J209 Acute bronchitis, unspecified: Secondary | ICD-10-CM | POA: Diagnosis not present

## 2021-07-27 DIAGNOSIS — J019 Acute sinusitis, unspecified: Secondary | ICD-10-CM | POA: Diagnosis not present

## 2021-07-27 DIAGNOSIS — J040 Acute laryngitis: Secondary | ICD-10-CM | POA: Diagnosis not present

## 2021-07-27 DIAGNOSIS — J029 Acute pharyngitis, unspecified: Secondary | ICD-10-CM | POA: Diagnosis not present

## 2021-08-01 ENCOUNTER — Telehealth: Payer: Self-pay | Admitting: *Deleted

## 2021-08-01 NOTE — Telephone Encounter (Signed)
LMOM the next available dates for 72 hour ambulatory EEG are: ?Friday May 5th at 10:30 return Monday May 8th at 11:00 for removal ?Or ?Monday May 8th at 7:30 or 9:00 am return for removal Thursday May 11:00 8am or 9:30am respectively ?

## 2021-08-04 DIAGNOSIS — R262 Difficulty in walking, not elsewhere classified: Secondary | ICD-10-CM | POA: Diagnosis not present

## 2021-08-04 DIAGNOSIS — M25672 Stiffness of left ankle, not elsewhere classified: Secondary | ICD-10-CM | POA: Diagnosis not present

## 2021-08-04 DIAGNOSIS — R531 Weakness: Secondary | ICD-10-CM | POA: Diagnosis not present

## 2021-08-05 DIAGNOSIS — F419 Anxiety disorder, unspecified: Secondary | ICD-10-CM | POA: Diagnosis not present

## 2021-08-05 DIAGNOSIS — F4321 Adjustment disorder with depressed mood: Secondary | ICD-10-CM | POA: Diagnosis not present

## 2021-08-05 DIAGNOSIS — I10 Essential (primary) hypertension: Secondary | ICD-10-CM | POA: Diagnosis not present

## 2021-08-05 DIAGNOSIS — Z9189 Other specified personal risk factors, not elsewhere classified: Secondary | ICD-10-CM | POA: Diagnosis not present

## 2021-08-08 DIAGNOSIS — Z9889 Other specified postprocedural states: Secondary | ICD-10-CM | POA: Diagnosis not present

## 2021-08-08 DIAGNOSIS — M25672 Stiffness of left ankle, not elsewhere classified: Secondary | ICD-10-CM | POA: Diagnosis not present

## 2021-08-08 DIAGNOSIS — R262 Difficulty in walking, not elsewhere classified: Secondary | ICD-10-CM | POA: Diagnosis not present

## 2021-08-08 DIAGNOSIS — R531 Weakness: Secondary | ICD-10-CM | POA: Diagnosis not present

## 2021-08-08 DIAGNOSIS — M25572 Pain in left ankle and joints of left foot: Secondary | ICD-10-CM | POA: Diagnosis not present

## 2021-08-10 DIAGNOSIS — R262 Difficulty in walking, not elsewhere classified: Secondary | ICD-10-CM | POA: Diagnosis not present

## 2021-08-10 DIAGNOSIS — M25672 Stiffness of left ankle, not elsewhere classified: Secondary | ICD-10-CM | POA: Diagnosis not present

## 2021-08-10 DIAGNOSIS — R531 Weakness: Secondary | ICD-10-CM | POA: Diagnosis not present

## 2021-08-23 ENCOUNTER — Other Ambulatory Visit: Payer: Self-pay | Admitting: Orthopaedic Surgery

## 2021-08-23 DIAGNOSIS — M25572 Pain in left ankle and joints of left foot: Secondary | ICD-10-CM

## 2021-08-26 ENCOUNTER — Ambulatory Visit (INDEPENDENT_AMBULATORY_CARE_PROVIDER_SITE_OTHER): Payer: Medicare Other | Admitting: Neurology

## 2021-08-26 DIAGNOSIS — R55 Syncope and collapse: Secondary | ICD-10-CM

## 2021-08-29 ENCOUNTER — Other Ambulatory Visit: Payer: PRIVATE HEALTH INSURANCE

## 2021-08-29 NOTE — Progress Notes (Signed)
?Cardiology Office Note:   ? ?Date:  08/31/2021  ? ?ID:  Danielle Garrett, DOB 1951-04-03, MRN [id number] ? ?PCP:  London Pepper, MD  ?Cardiologist:  None  ? ?Referring MD: London Pepper, MD  ? ?Chief Complaint  ?Patient presents with  ? Loss of Consciousness  ?  Neurocardiogenic syncope most likely.  Mechanism never identified  ? ? ?History of Present Illness:   ? ?Danielle Garrett is a 71 y.o. female with a hx of LBBB, primary hypertension and syncope previously evaluated by Dr. [name]. Had Loop recorder placed 2018 and removed 2020. ? ?Please see the preceding office note generated several weeks ago. ? ?The patient has had a relatively long history of what seems to be neurocardiogenic syncope.  He has been previously evaluated by Dr. Lovena Le.  She had a loop recorder placed in 2018 and never had an episode of syncope and till after the device was explanted in 2020.  This past January she had an episode that led to injury and hospitalization with a fractured leg. ? ?She was seen here and a 30-day monitor has not demonstrated any significant arrhythmia.  She has had no recurrence of syncope.  Because of relatively short prodrome and recurrent syncope, she has been advised not to drive.  She has become extremely depressed about this. ? ?Past Medical History:  ?Diagnosis Date  ? Allergic rhinitis due to other allergen   ? Anginal pain ([place]) 2011  ? hospitalized for CP in the past; due to low potassium  ? Anxiety   ? Arthritis   ? Childhood asthma   ? Chronic hoarseness   ? Gastroparesis   ? GERD (gastroesophageal reflux disease)   ? History of hiatal hernia   ? Hx of blood transfusion reaction   ? in the early 80s  ? Hypertension   ? Left bundle branch block   ?  (abnormal heart rhythm)  ? PONV (postoperative nausea and vomiting)   ? Pyloric stenosis   ? ? ?Past Surgical History:  ?Procedure Laterality Date  ? ABDOMINAL HYSTERECTOMY  1983  ? CARDIAC CATHETERIZATION  10/2009  ? Cardiac cath normal coronary  arteries  ? CERVICAL DISC ARTHROPLASTY    ? disc replacement in neck 1998  ? COLONOSCOPY    ? 10 year repeat 09/26/2010  ? ESOPHAGOGASTRODUODENOSCOPY  09/26/2010,01/08/13  ? LOOP RECORDER INSERTION N/A 05/29/2016  ? Procedure: Loop Recorder Insertion;  Surgeon: Evans Lance, MD;  Location: [facility];  Service: Cardiovascular;  Laterality: N/A;  ? OTHER SURGICAL HISTORY  1983  ? hysterectomy ovaries intact, prolapsed   ? OTHER SURGICAL HISTORY  2006  ? pyloric stricture surgery  ? TOTAL HIP ARTHROPLASTY Right 06/07/2015  ? Procedure: TOTAL HIP ARTHROPLASTY ANTERIOR APPROACH;  Surgeon: Frederik Pear, MD;  Location: [facility];  Service: Orthopedics;  Laterality: Right;  ? ? ?Current Medications: ?Current Meds  ?Medication Sig  ? albuterol (PROVENTIL HFA;VENTOLIN HFA) 108 (90 Base) MCG/ACT inhaler Inhale 1-2 puffs into the lungs every 4 (four) hours as needed for wheezing or shortness of breath.  ? amLODipine (NORVASC) 5 MG tablet Take 0.5 mg by mouth daily.  ? aspirin EC 81 MG tablet Take 81 mg by mouth daily.  ? buPROPion (WELLBUTRIN XL) 150 MG 24 hr tablet Take 1 tablet by mouth daily.  ? FLUoxetine (PROZAC) 10 MG capsule Take 1 capsule by mouth daily.  ? fluticasone (FLONASE) 50 MCG/ACT nasal spray Place 2 sprays into both nostrils as needed for  allergies or rhinitis.  ? loratadine (CLARITIN) 10 MG tablet Take 10 mg by mouth daily.  ? losartan (COZAAR) 100 MG tablet Take 100 mg by mouth daily.  ? Multiple Vitamins-Minerals (CENTRUM SILVER 50+WOMEN PO) Take 1 tablet by mouth daily.  ? pantoprazole (PROTONIX) 40 MG tablet Take 40 mg by mouth 2 (two) times daily.  ? rosuvastatin (CRESTOR) 10 MG tablet Take 10 mg by mouth 3 (three) times a week.  ? sucralfate (CARAFATE) 1 g tablet Take 1 g by mouth 2 (two) times daily as needed (Takes if Pantoprazole is not working).   ? VITAMIN D PO Take 1 tablet by mouth daily.  ?  ? ?Allergies:   Hydrochlorothiazide, Penicillins, Adhesive [tape], Atorvastatin calcium, Codeine, Sulfa  antibiotics, and Vicodin [hydrocodone-acetaminophen]  ? ?Social History  ? ?Socioeconomic History  ? Marital status: Married  ?  Spouse name: Not on file  ? Number of children: Not on file  ? Years of education: Not on file  ? Highest education level: Not on file  ?Occupational History  ? Not on file  ?Tobacco Use  ? Smoking status: Never  ? Smokeless tobacco: Never  ?Vaping Use  ? Vaping Use: Never used  ?Substance and Sexual Activity  ? Alcohol use: No  ?  Alcohol/week: 0.0 standard drinks  ? Drug use: No  ? Sexual activity: Not on file  ?Other Topics Concern  ? Not on file  ?Social History Narrative  ? ** Merged History Encounter **   ? Right handed   ? ?Social Determinants of Health  ? ?Financial Resource Strain: Not on file  ?Food Insecurity: Not on file  ?Transportation Needs: Not on file  ?Physical Activity: Not on file  ?Stress: Not on file  ?Social Connections: Not on file  ?  ? ?Family History: ?The patient's family history includes Cirrhosis in her father; Depression in her sister and sister; Hypertension in her father, mother, and sister; OCD in her sister. ? ?ROS:   ?Please see the history of present illness.    ?She denies chest pain, orthopnea, PND, and dyspnea on exertion.  I do note on the echo done in [place] that she had elevated pulmonary artery pressure with systolic estimation to be around 55 mmHg.  All other systems reviewed and are negative. ? ?EKGs/Labs/Other Studies Reviewed:   ? ?The following studies were reviewed today: ? ?2D Doppler echocardiogram from [facility] January 2023: ?EF greater than 55%.  Grade 1 diastolic dysfunction with no regional wall motion abnormality ?PA systolic pressure 54 mmHg ?Moderate tricuspid regurgitation ?Mild mitral regurgitation ? ?[place] 08/05/2021: ?Study Highlights ? ?  ?Basic rhythm is NSR with BBB pattern ?No excessive bradtcardia or significant tachycardia ?No ventricular tachycardia ?  ?Overall, no findings to explain  syncope. ? ?EKG:  EKG not repeated ? ?Recent Labs: ?No results found for requested labs within last 8760 hours.  ?Recent Lipid Panel ?   ?Component Value Date/Time  ? CHOL (H) 11/09/2009 0215  ?  216        ?ATP III CLASSIFICATION: ? <200     mg/dL   Desirable ? 200-239  mg/dL   Borderline High ? >=240    mg/dL   High ?        ? TRIG 71 11/09/2009 0215  ? HDL 69 11/09/2009 0215  ? CHOLHDL 3.1 11/09/2009 0215  ? VLDL 14 11/09/2009 0215  ? [place] (H) 11/09/2009 0215  ?  133        ?  Total Cholesterol/HDL:CHD Risk ?Coronary Heart Disease Risk Table ?                    Men   Women ? 1/2 Average Risk   3.4   3.3 ? Average Risk       5.0   4.4 ? 2 X Average Risk   9.6   7.1 ? 3 X Average Risk  23.4   11.0 ?       ?Use the calculated Patient Ratio ?above and the CHD Risk Table ?to determine the patient's CHD Risk. ?       ?ATP III CLASSIFICATION (LDL): ? <100     mg/dL   Optimal ? 100-129  mg/dL   Near or Above ?                   Optimal ? 130-159  mg/dL   Borderline ? 160-189  mg/dL   High ? >190     mg/dL   Very High  ? ? ?Physical Exam:   ? ?VS:  BP (!) 148/88   Pulse 87   Ht 5' (1.524 m)   Wt 115 lb (52.2 kg)   SpO2 98%   BMI 22.46 kg/m?    ? ?Wt Readings from Last 3 Encounters:  ?08/31/21 115 lb (52.2 kg)  ?07/20/21 115 lb (52.2 kg)  ?06/20/21 115 lb (52.2 kg)  ?  ? ?GEN: Healthy appearing. No acute distress ?HEENT: Normal ?NECK: No JVD. ?LYMPHATICS: No lymphadenopathy ?CARDIAC: Soft left parasternal systolic murmur. RRR no gallop, or edema. ?VASCULAR:  Normal Pulses. No bruits. ?RESPIRATORY:  Clear to auscultation without rales, wheezing or rhonchi  ?ABDOMEN: Soft, non-tender, non-distended, No pulsatile mass, ?MUSCULOSKELETAL: No deformity  ?SKIN: Warm and dry ?NEUROLOGIC:  Alert and oriented x 3 ?PSYCHIATRIC:  Normal affect  ? ?ASSESSMENT:   ? ?1. Neurocardiogenic syncope   ?2. Pulmonary hypertension, unspecified ([place])   ?3. Atrial fibrillation with tachycardic ventricular rate ([place])   ?4. LBBB (left bundle  branch block)   ? ?PLAN:   ? ?In order of problems listed above: ? ?This is the likely diagnosis.  We need information about the predominant mechanism whether cardioinhibitory or vasodepressive.  She may need a tilt

## 2021-08-30 NOTE — Procedures (Signed)
ELECTROENCEPHALOGRAM REPORT ? ?Dates of Recording: 08/26/2021 at 11:44 AM to 08/29/2021 at 12:15 PM ? ?Patient's Name: Danielle Garrett ?MRN: [id number] ?Date of Birth: Sep 27, 1950 ? ?Procedure: 72-hour ambulatory EEG ? ?History: 71 year old female with recurrent episodes of syncope/near syncope and episodes of right facial and right upper extremity paresthesias. ? ?Medications:  ?PROVENTIL HFA;VENTOLIN HFA 108 (90 Base) MCG/ACT inhaler ?aspirin EC 81 MG tablet ?b complex vitamins tablet ?PROZAC 10 MG capsule ?FLONASE 50 MCG/ACT nasal spray ?CLARITIN 10 MG tablet ?COZAAR 100 MG tablet ?CENTRUM SILVER 50+WOMEN PO ?PROTONIX 40 MG tablet ?CRESTOR 10 MG tablet ?CARAFATE 1 g tablet ?VITAMIN D PO ? ?Technical Summary: ?This is a 72-hour multichannel digital EEG recording measured by the international 10-20 system with electrodes applied with paste and impedances below 5000 ohms performed as portable with EKG monitoring.  The digital EEG was referentially recorded, reformatted, and digitally filtered in a variety of bipolar and referential montages for optimal display.   ? ?DESCRIPTION OF RECORDING: ?During maximal wakefulness, the background activity consisted of a symmetric 9.5 Hz posterior dominant rhythm which was reactive to eye opening.  There were no epileptiform discharges or focal slowing seen in wakefulness. ? ?During the recording, the patient progresses through wakefulness, drowsiness, and Stage 2 sleep.  Again, there were no epileptiform discharges seen. ? ?Events: ? ?There were no electrographic seizures seen.  EKG lead was unremarkable. ? ?IMPRESSION: This 72-hour ambulatory EEG study is normal.   ? ?CLINICAL CORRELATION: A normal EEG does not exclude a clinical diagnosis of epilepsy.  If further clinical questions remain, inpatient video EEG monitoring may be helpful. ? ? ?Metta Clines, DO ? ?

## 2021-08-31 ENCOUNTER — Ambulatory Visit (INDEPENDENT_AMBULATORY_CARE_PROVIDER_SITE_OTHER): Payer: Medicare Other | Admitting: Interventional Cardiology

## 2021-08-31 ENCOUNTER — Encounter: Payer: Self-pay | Admitting: Interventional Cardiology

## 2021-08-31 VITALS — BP 148/88 | HR 87 | Ht 60.0 in | Wt 115.0 lb

## 2021-08-31 DIAGNOSIS — I447 Left bundle-branch block, unspecified: Secondary | ICD-10-CM

## 2021-08-31 DIAGNOSIS — R55 Syncope and collapse: Secondary | ICD-10-CM | POA: Diagnosis not present

## 2021-08-31 DIAGNOSIS — I4891 Unspecified atrial fibrillation: Secondary | ICD-10-CM | POA: Diagnosis not present

## 2021-08-31 DIAGNOSIS — I495 Sick sinus syndrome: Secondary | ICD-10-CM

## 2021-08-31 DIAGNOSIS — I272 Pulmonary hypertension, unspecified: Secondary | ICD-10-CM | POA: Diagnosis not present

## 2021-08-31 NOTE — Patient Instructions (Signed)
Medication Instructions:  ?Your physician recommends that you continue on your current medications as directed. Please refer to the Current Medication list given to you today. ? ?*If you need a refill on your cardiac medications before your next appointment, please call your pharmacy* ? ?Lab Work: ?NONE ? ?Testing/Procedures: ?NONE ? ?Follow-Up: ?At [facility], you and your health needs are our priority.  As part of our continuing mission to provide you with exceptional heart care, we have created designated Provider Care Teams.  These Care Teams include your primary Cardiologist (physician) and Advanced Practice Providers (APPs -  Physician Assistants and Nurse Practitioners) who all work together to provide you with the care you need, when you need it. ? ?Your next appointment:   ?2 week(s) ? ?The format for your next appointment:   ?In Person ? ?Provider:   ?[name], MD ? ?Other Instructions ?You have been referred to Dr. [name] for follow-up, his scheduling team will call you to schedule an appointment. ? ?Important Information About Sugar ? ? ? ? ?  ?

## 2021-09-05 DIAGNOSIS — E785 Hyperlipidemia, unspecified: Secondary | ICD-10-CM | POA: Diagnosis not present

## 2021-09-05 DIAGNOSIS — I10 Essential (primary) hypertension: Secondary | ICD-10-CM | POA: Diagnosis not present

## 2021-09-05 DIAGNOSIS — K219 Gastro-esophageal reflux disease without esophagitis: Secondary | ICD-10-CM | POA: Diagnosis not present

## 2021-09-05 DIAGNOSIS — F4321 Adjustment disorder with depressed mood: Secondary | ICD-10-CM | POA: Diagnosis not present

## 2021-09-07 ENCOUNTER — Ambulatory Visit
Admission: RE | Admit: 2021-09-07 | Discharge: 2021-09-07 | Disposition: A | Discharge status: Home / Self Care | Payer: Medicare Other | Source: Ambulatory Visit | Attending: Orthopaedic Surgery | Admitting: Orthopaedic Surgery

## 2021-09-07 DIAGNOSIS — M25572 Pain in left ankle and joints of left foot: Secondary | ICD-10-CM

## 2021-09-07 DIAGNOSIS — S82852D Displaced trimalleolar fracture of left lower leg, subsequent encounter for closed fracture with routine healing: Secondary | ICD-10-CM | POA: Diagnosis not present

## 2021-09-07 DIAGNOSIS — M25472 Effusion, left ankle: Secondary | ICD-10-CM | POA: Diagnosis not present

## 2021-09-07 IMAGING — CT CT ANKLE*L* W/O CM
3 series · 11 of 33 positions shown, 13 images · non-contrast
Comparison: None Available.

CLINICAL DATA: Left ankle fracture on [DATE] status post ORIF

EXAM:
CT OF THE LEFT ANKLE WITHOUT CONTRAST
TECHNIQUE: Multidetector CT imaging of the left ankle was performed according
to the standard protocol. Multiplanar CT image reconstructions were
also generated.
RADIATION DOSE REDUCTION: This exam was performed according to the
departmental dose-optimization program which includes automated
exposure control, adjustment of the mA and/or kV according to
patient size and/or use of iterative reconstruction technique.

[Series 5: sfov lower extremity 2.00 br40 s3 soft · axial · 0.27mm/px · z∈[+555,+693]mm · 3 of 115 slices shown, 4 images (1 of 3)]
[im 27/115  soft-tissue]
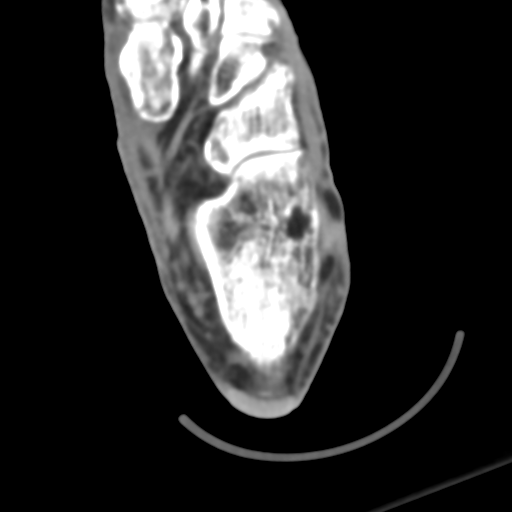
[im 27/115  bone]
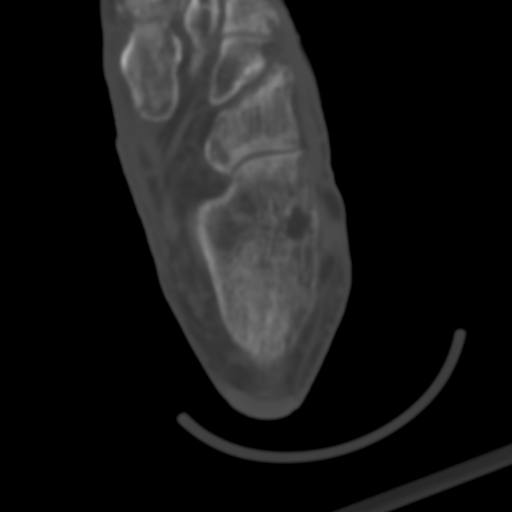
[im 62/115  bone]
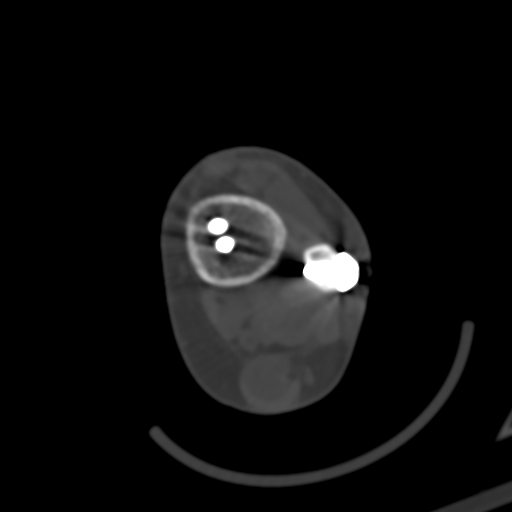
[im 97/115  bone]
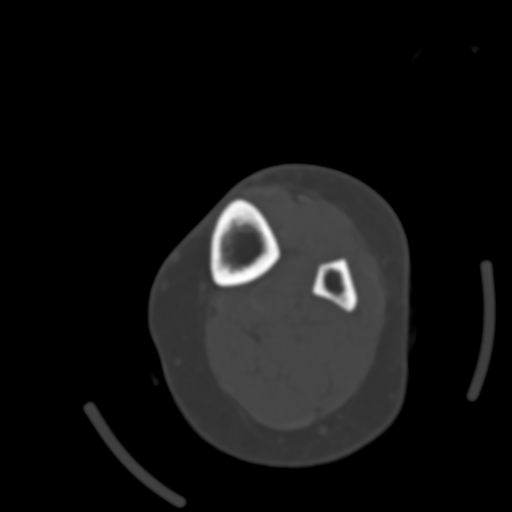

[Series 9: sfov lower extremity 2.00 br40 s3 soft · coronal · 0.27mm/px · 3 of 67 slices shown (2 of 3)]
[im 14/67  bone]
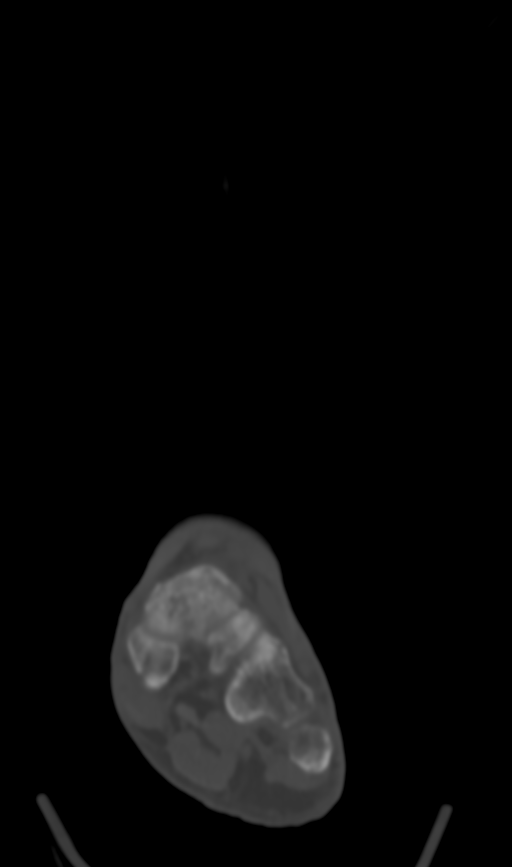
[im 27/67  bone]
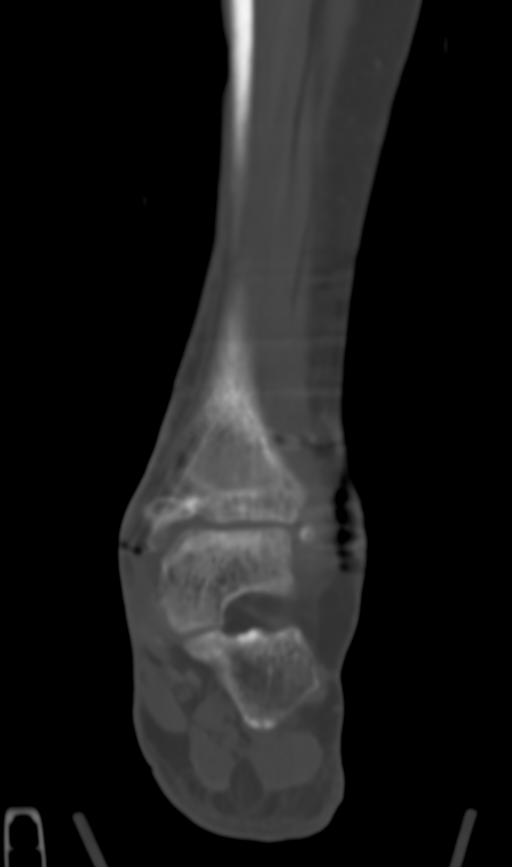
[im 40/67  bone]
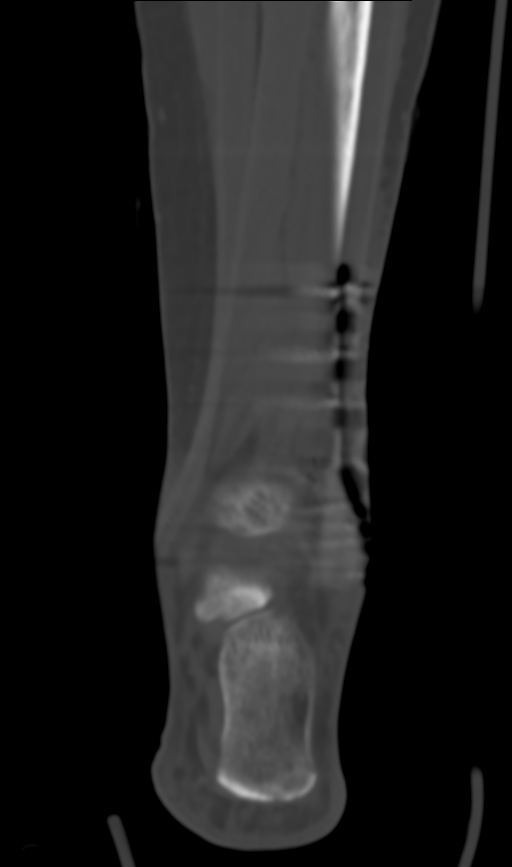

[Series 13: sfov lower extremity 2.00 br40 s3 soft · sagittal · 0.27mm/px · 5 of 68 slices shown, 6 images (3 of 3)]
[im 23/68  bone]
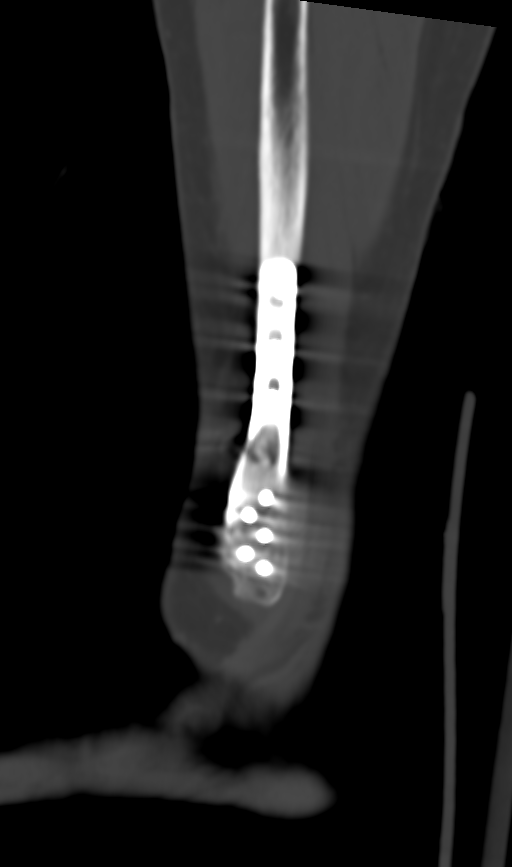
[im 28/68  bone]
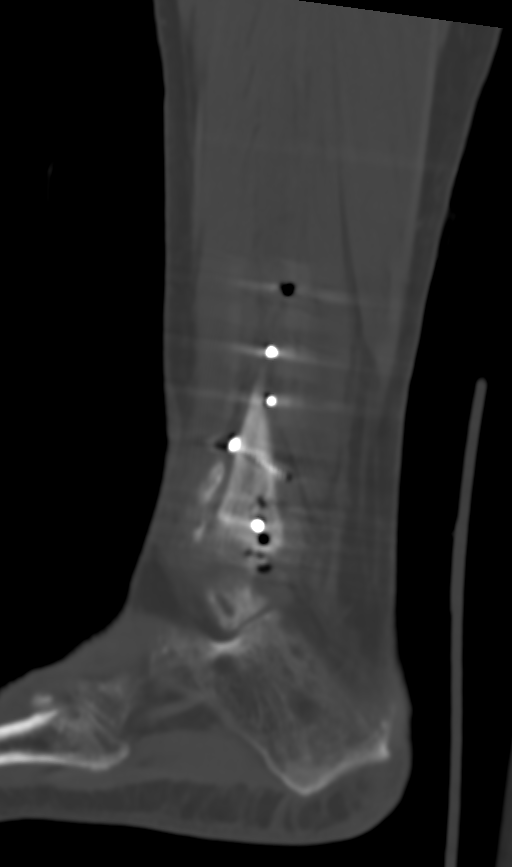
[im 34/68  soft-tissue]
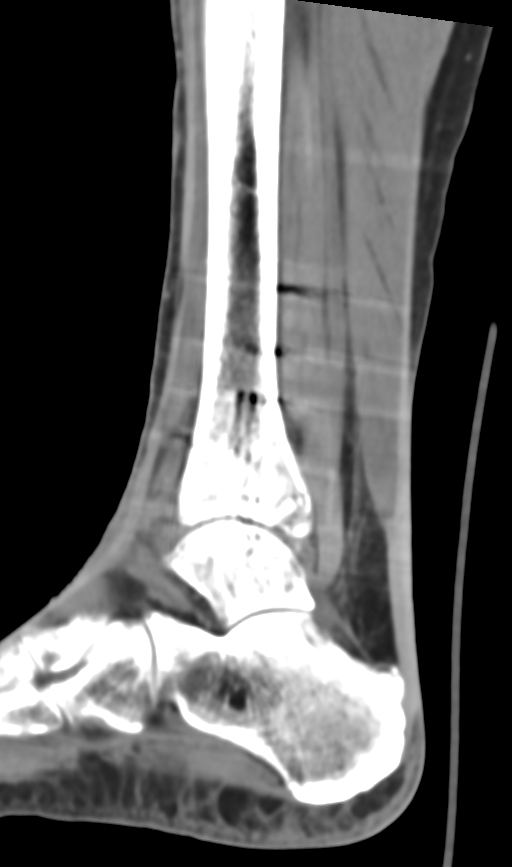
[im 34/68  bone]
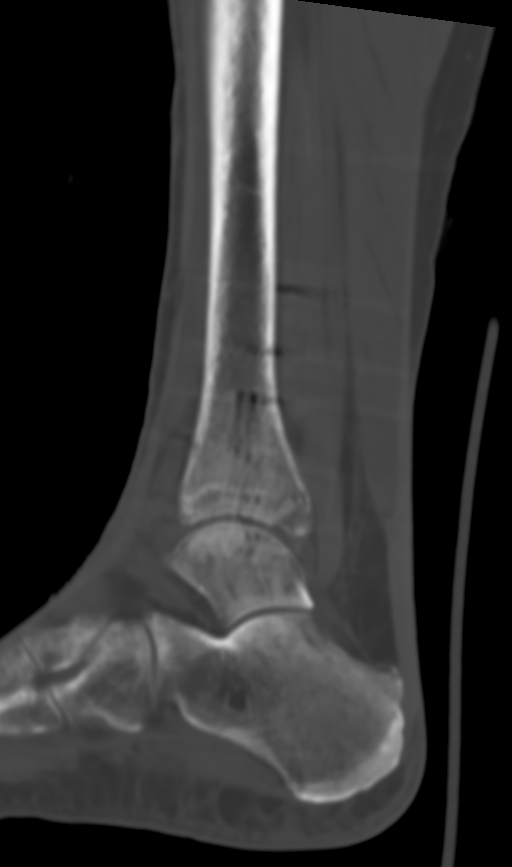
[im 40/68  bone]
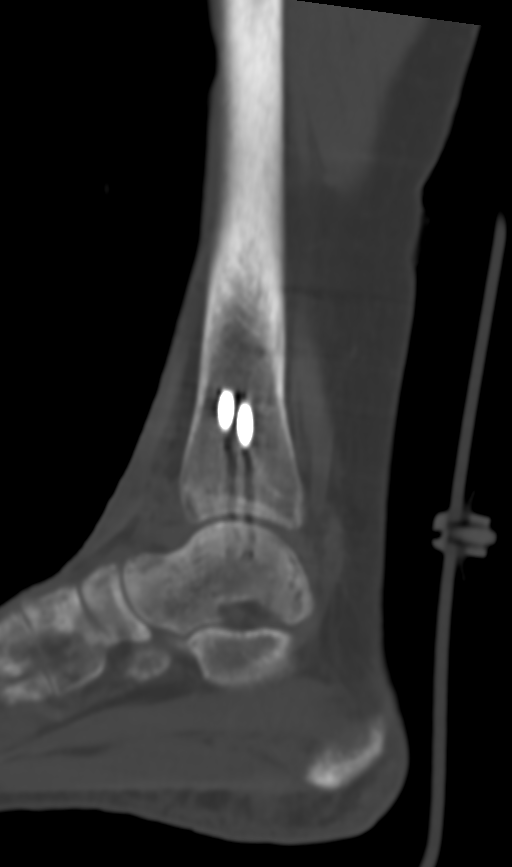
[im 45/68  bone]
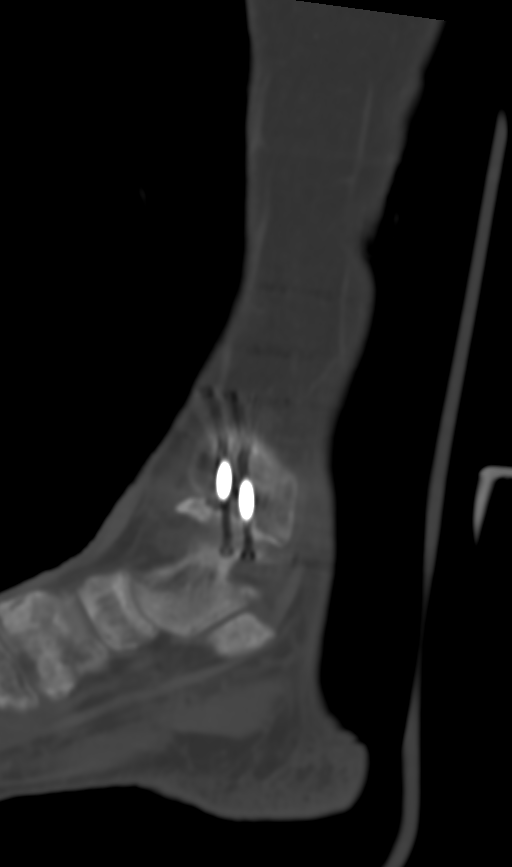

[11 of 33 positions shown; findings below may reference images not displayed]

FINDINGS: Bones/Joint/Cartilage

Status post ORIF of trimalleolar left ankle fracture with lateral
sideplate and screw fixation construct of the distal fibula and 2
partially threaded cannulated fixating screws in the medial
malleolus. Solid bridging bone formation traverses both fracture
sites. No residual fracture lucency is seen. Healed fracture
deformity of the posterior malleolus. Ankle mortise remains
congruent without malalignment. Joint spaces are preserved. No
evidence of an acute fracture. Small to moderate-sized tibiotalar
joint effusion. Bones are demineralized, which may be secondary to
disuse.

Ligaments

Suboptimally assessed by CT.

Muscles and Tendons

Musculotendinous structures appear within normal limits by CT.

Soft tissues

No soft tissue swelling or fluid collections.
IMPRESSION: 1. Well-healed trimalleolar fractures of the left ankle status post
ORIF.
2. Small to moderate-sized tibiotalar joint effusion.

## 2021-09-08 DIAGNOSIS — J301 Allergic rhinitis due to pollen: Secondary | ICD-10-CM | POA: Diagnosis not present

## 2021-09-13 ENCOUNTER — Other Ambulatory Visit: Payer: Self-pay | Admitting: Family Medicine

## 2021-09-13 DIAGNOSIS — M8588 Other specified disorders of bone density and structure, other site: Secondary | ICD-10-CM

## 2021-09-16 DIAGNOSIS — M25572 Pain in left ankle and joints of left foot: Secondary | ICD-10-CM | POA: Diagnosis not present

## 2021-09-22 DIAGNOSIS — J301 Allergic rhinitis due to pollen: Secondary | ICD-10-CM | POA: Diagnosis not present

## 2021-09-26 ENCOUNTER — Ambulatory Visit: Payer: PRIVATE HEALTH INSURANCE | Admitting: Neurology

## 2021-09-28 ENCOUNTER — Ambulatory Visit (INDEPENDENT_AMBULATORY_CARE_PROVIDER_SITE_OTHER): Payer: Medicare Other | Admitting: Internal Medicine

## 2021-09-28 ENCOUNTER — Encounter: Payer: Self-pay | Admitting: Internal Medicine

## 2021-09-28 VITALS — BP 116/80 | HR 74 | Ht 60.0 in | Wt 114.8 lb

## 2021-09-28 DIAGNOSIS — G909 Disorder of the autonomic nervous system, unspecified: Secondary | ICD-10-CM | POA: Insufficient documentation

## 2021-09-28 DIAGNOSIS — I447 Left bundle-branch block, unspecified: Secondary | ICD-10-CM | POA: Diagnosis not present

## 2021-09-28 DIAGNOSIS — R55 Syncope and collapse: Secondary | ICD-10-CM

## 2021-09-28 NOTE — Patient Instructions (Addendum)
Medication Instructions:  Your physician recommends that you continue on your current medications as directed. Please refer to the Current Medication list given to you today.  Labwork: None ordered.  Testing/Procedures: None ordered.  Follow-Up: Your physician wants you to follow-up in: 4 months with Gregg Taylor, MD    Any Other Special Instructions Will Be Listed Below (If Applicable).  If you need a refill on your cardiac medications before your next appointment, please call your pharmacy.   Important Information About Sugar        

## 2021-09-28 NOTE — Progress Notes (Signed)
HPI Danielle Garrett returns today for followup. She is a pleasant 71 yo woman with a h/o HTN, and long standing neurally mediated syncope. The patient last saw min 3 years ago. Her ILR was removed. She has had a recurrent episode of syncope down in [place] and broke her left leg. She has not had any since then. She has passed out for over 40 years. She has spells when standing or sitting. The last episode was about 4 months ago. She is quite anxious about this problem. Allergies  Allergen Reactions   Hydrochlorothiazide Nausea Only   Penicillins Other (See Comments)    Unknown Other reaction(s): Other (See Comments) Pt was a child   Adhesive [Tape]     Pt states ' it took my skin off' Steri-Strips   Atorvastatin Calcium     Other reaction(s): insomnia   Codeine Nausea And Vomiting   Sulfa Antibiotics Nausea And Vomiting   Vicodin [Hydrocodone-Acetaminophen] Nausea And Vomiting and Other (See Comments)    Did not feel like herself     Current Outpatient Medications  Medication Sig Dispense Refill   albuterol (PROVENTIL HFA;VENTOLIN HFA) 108 (90 Base) MCG/ACT inhaler Inhale 1-2 puffs into the lungs every 4 (four) hours as needed for wheezing or shortness of breath.     amLODipine (NORVASC) 5 MG tablet Take 0.5 mg by mouth daily.     aspirin EC 81 MG tablet Take 81 mg by mouth daily.     buPROPion (WELLBUTRIN XL) 150 MG 24 hr tablet Take 1 tablet by mouth daily.     FLUoxetine (PROZAC) 10 MG capsule Take 1 capsule by mouth daily.     fluticasone (FLONASE) 50 MCG/ACT nasal spray Place 2 sprays into both nostrils as needed for allergies or rhinitis.     loratadine (CLARITIN) 10 MG tablet Take 10 mg by mouth daily.     losartan (COZAAR) 100 MG tablet Take 100 mg by mouth daily.     Multiple Vitamins-Minerals (CENTRUM SILVER 50+WOMEN PO) Take 1 tablet by mouth daily.     pantoprazole (PROTONIX) 40 MG tablet Take 40 mg by mouth 2 (two) times daily.     rosuvastatin (CRESTOR) 10 MG  tablet Take 10 mg by mouth 3 (three) times a week.     sucralfate (CARAFATE) 1 g tablet Take 1 g by mouth 2 (two) times daily as needed (Takes if Pantoprazole is not working).      VITAMIN D PO Take 1 tablet by mouth daily.     No current facility-administered medications for this visit.     Past Medical History:  Diagnosis Date   Allergic rhinitis due to other allergen    Anginal pain ([place]) 2011   hospitalized for CP in the past; due to low potassium   Anxiety    Arthritis    Childhood asthma    Chronic hoarseness    Gastroparesis    GERD (gastroesophageal reflux disease)    History of hiatal hernia    Hx of blood transfusion reaction    in the early 80s   Hypertension    Left bundle branch block     (abnormal heart rhythm)   PONV (postoperative nausea and vomiting)    Pyloric stenosis     ROS:   All systems reviewed and negative except as noted in the HPI.   Past Surgical History:  Procedure Laterality Date   ABDOMINAL HYSTERECTOMY  1983   CARDIAC CATHETERIZATION  10/2009   Cardiac  cath normal coronary arteries   CERVICAL DISC ARTHROPLASTY     disc replacement in neck 1998   COLONOSCOPY     10 year repeat 09/26/2010   ESOPHAGOGASTRODUODENOSCOPY  09/26/2010,01/08/13   LOOP RECORDER INSERTION N/A 05/29/2016   Procedure: Loop Recorder Insertion;  Surgeon: Evans Lance, MD;  Location: [facility];  Service: Cardiovascular;  Laterality: N/A;   OTHER SURGICAL HISTORY  1983   hysterectomy ovaries intact, prolapsed    OTHER SURGICAL HISTORY  2006   pyloric stricture surgery   TOTAL HIP ARTHROPLASTY Right 06/07/2015   Procedure: TOTAL HIP ARTHROPLASTY ANTERIOR APPROACH;  Surgeon: Frederik Pear, MD;  Location: [facility];  Service: Orthopedics;  Laterality: Right;     Family History  Problem Relation Age of Onset   Hypertension Mother    Cirrhosis Father    Hypertension Father    OCD Sister    Depression Sister    Depression Sister    Hypertension Sister       Social History   Socioeconomic History   Marital status: Married    Spouse name: Not on file   Number of children: Not on file   Years of education: Not on file   Highest education level: Not on file  Occupational History   Not on file  Tobacco Use   Smoking status: Never   Smokeless tobacco: Never  Vaping Use   Vaping Use: Never used  Substance and Sexual Activity   Alcohol use: No    Alcohol/week: 0.0 standard drinks of alcohol   Drug use: No   Sexual activity: Not on file  Other Topics Concern   Not on file  Social History Narrative   ** Merged History Encounter **    Right handed    Social Determinants of Health   Financial Resource Strain: Not on file  Food Insecurity: Not on file  Transportation Needs: Not on file  Physical Activity: Not on file  Stress: Not on file  Social Connections: Not on file  Intimate Partner Violence: Not on file     BP 116/80   Pulse 74   Ht 5' (1.524 m)   Wt 114 lb 12.8 oz (52.1 kg)   SpO2 97%   BMI 22.42 kg/m   Physical Exam:  Well appearing NAD HEENT: Unremarkable Neck:  No JVD, no thyromegally Lymphatics:  No adenopathy Back:  No CVA tenderness Lungs:  Clear with no wheezes HEART:  Regular rate rhythm, no murmurs, no rubs, no clicks Abd:  soft, positive bowel sounds, no organomegally, no rebound, no guarding Ext:  2 plus pulses, no edema, no cyanosis, no clubbing Skin:  No rashes no nodules Neuro:  CN II through XII intact, motor grossly intact  EKG - reviewed   Assess/Plan:  Syncope - she continues to be bothered by autonomic dysfunction and I reminded her of the mechanism/and treatment. She will undergo watchful waiting LBBB - she has not had worsening of her conduction system disease. No indication for PPM.  HTN - her bp is well controlled.   Carleene Overlie Giovoni Bunch,MD

## 2021-10-07 ENCOUNTER — Ambulatory Visit
Admission: RE | Admit: 2021-10-07 | Discharge: 2021-10-07 | Disposition: A | Discharge status: Home / Self Care | Payer: Medicare Other | Source: Ambulatory Visit | Attending: Family Medicine | Admitting: Family Medicine

## 2021-10-07 DIAGNOSIS — M85852 Other specified disorders of bone density and structure, left thigh: Secondary | ICD-10-CM | POA: Diagnosis not present

## 2021-10-07 DIAGNOSIS — M8588 Other specified disorders of bone density and structure, other site: Secondary | ICD-10-CM

## 2021-10-07 DIAGNOSIS — Z780 Asymptomatic menopausal state: Secondary | ICD-10-CM | POA: Diagnosis not present

## 2021-10-07 DIAGNOSIS — M810 Age-related osteoporosis without current pathological fracture: Secondary | ICD-10-CM | POA: Diagnosis not present

## 2021-10-09 DIAGNOSIS — N390 Urinary tract infection, site not specified: Secondary | ICD-10-CM | POA: Diagnosis not present

## 2021-10-23 ENCOUNTER — Encounter: Payer: Self-pay | Admitting: Internal Medicine

## 2021-10-25 MED ORDER — FLUDROCORTISONE ACETATE 0.1 MG PO TABS: ORAL_TABLET | ORAL | 11 refills | Status: DC

## 2021-10-27 MED ORDER — FLUDROCORTISONE ACETATE 0.1 MG PO TABS: ORAL_TABLET | ORAL | 11 refills | Status: DC

## 2021-10-28 DIAGNOSIS — R1013 Epigastric pain: Secondary | ICD-10-CM | POA: Diagnosis not present

## 2021-10-28 DIAGNOSIS — K5900 Constipation, unspecified: Secondary | ICD-10-CM | POA: Diagnosis not present

## 2021-10-28 DIAGNOSIS — R112 Nausea with vomiting, unspecified: Secondary | ICD-10-CM | POA: Diagnosis not present

## 2021-11-09 DIAGNOSIS — G909 Disorder of the autonomic nervous system, unspecified: Secondary | ICD-10-CM | POA: Diagnosis not present

## 2021-11-09 DIAGNOSIS — M25572 Pain in left ankle and joints of left foot: Secondary | ICD-10-CM | POA: Diagnosis not present

## 2021-11-09 DIAGNOSIS — F418 Other specified anxiety disorders: Secondary | ICD-10-CM | POA: Diagnosis not present

## 2021-11-09 DIAGNOSIS — E785 Hyperlipidemia, unspecified: Secondary | ICD-10-CM | POA: Diagnosis not present

## 2021-11-09 DIAGNOSIS — I10 Essential (primary) hypertension: Secondary | ICD-10-CM | POA: Diagnosis not present

## 2021-11-27 DIAGNOSIS — J029 Acute pharyngitis, unspecified: Secondary | ICD-10-CM | POA: Diagnosis not present

## 2021-11-27 DIAGNOSIS — J060 Acute laryngopharyngitis: Secondary | ICD-10-CM | POA: Diagnosis not present

## 2021-12-15 DIAGNOSIS — J301 Allergic rhinitis due to pollen: Secondary | ICD-10-CM | POA: Diagnosis not present

## 2022-01-06 DIAGNOSIS — K259 Gastric ulcer, unspecified as acute or chronic, without hemorrhage or perforation: Secondary | ICD-10-CM | POA: Diagnosis not present

## 2022-01-06 DIAGNOSIS — K2980 Duodenitis without bleeding: Secondary | ICD-10-CM | POA: Diagnosis not present

## 2022-01-06 DIAGNOSIS — K219 Gastro-esophageal reflux disease without esophagitis: Secondary | ICD-10-CM | POA: Diagnosis not present

## 2022-01-06 DIAGNOSIS — K2930 Chronic superficial gastritis without bleeding: Secondary | ICD-10-CM | POA: Diagnosis not present

## 2022-01-06 DIAGNOSIS — K317 Polyp of stomach and duodenum: Secondary | ICD-10-CM | POA: Diagnosis not present

## 2022-01-06 DIAGNOSIS — R112 Nausea with vomiting, unspecified: Secondary | ICD-10-CM | POA: Diagnosis not present

## 2022-01-06 DIAGNOSIS — R1013 Epigastric pain: Secondary | ICD-10-CM | POA: Diagnosis not present

## 2022-01-06 DIAGNOSIS — K269 Duodenal ulcer, unspecified as acute or chronic, without hemorrhage or perforation: Secondary | ICD-10-CM | POA: Diagnosis not present

## 2022-01-10 DIAGNOSIS — K2980 Duodenitis without bleeding: Secondary | ICD-10-CM | POA: Diagnosis not present

## 2022-01-10 DIAGNOSIS — K2930 Chronic superficial gastritis without bleeding: Secondary | ICD-10-CM | POA: Diagnosis not present

## 2022-01-13 DIAGNOSIS — M810 Age-related osteoporosis without current pathological fracture: Secondary | ICD-10-CM | POA: Diagnosis not present

## 2022-01-13 DIAGNOSIS — K219 Gastro-esophageal reflux disease without esophagitis: Secondary | ICD-10-CM | POA: Diagnosis not present

## 2022-01-13 DIAGNOSIS — F4321 Adjustment disorder with depressed mood: Secondary | ICD-10-CM | POA: Diagnosis not present

## 2022-01-13 DIAGNOSIS — I10 Essential (primary) hypertension: Secondary | ICD-10-CM | POA: Diagnosis not present

## 2022-01-13 DIAGNOSIS — E785 Hyperlipidemia, unspecified: Secondary | ICD-10-CM | POA: Diagnosis not present

## 2022-01-16 DIAGNOSIS — R2689 Other abnormalities of gait and mobility: Secondary | ICD-10-CM | POA: Diagnosis not present

## 2022-01-16 DIAGNOSIS — E538 Deficiency of other specified B group vitamins: Secondary | ICD-10-CM | POA: Diagnosis not present

## 2022-01-16 DIAGNOSIS — Z23 Encounter for immunization: Secondary | ICD-10-CM | POA: Diagnosis not present

## 2022-01-16 DIAGNOSIS — R7309 Other abnormal glucose: Secondary | ICD-10-CM | POA: Diagnosis not present

## 2022-01-16 DIAGNOSIS — F418 Other specified anxiety disorders: Secondary | ICD-10-CM | POA: Diagnosis not present

## 2022-01-16 DIAGNOSIS — Z0000 Encounter for general adult medical examination without abnormal findings: Secondary | ICD-10-CM | POA: Diagnosis not present

## 2022-01-16 DIAGNOSIS — E785 Hyperlipidemia, unspecified: Secondary | ICD-10-CM | POA: Diagnosis not present

## 2022-01-16 DIAGNOSIS — I10 Essential (primary) hypertension: Secondary | ICD-10-CM | POA: Diagnosis not present

## 2022-01-16 DIAGNOSIS — G909 Disorder of the autonomic nervous system, unspecified: Secondary | ICD-10-CM | POA: Diagnosis not present

## 2022-01-16 DIAGNOSIS — M810 Age-related osteoporosis without current pathological fracture: Secondary | ICD-10-CM | POA: Diagnosis not present

## 2022-01-18 ENCOUNTER — Ambulatory Visit: Payer: Medicare Other | Attending: Family Medicine | Admitting: Physical Therapy

## 2022-01-18 ENCOUNTER — Encounter: Payer: Self-pay | Admitting: Physical Therapy

## 2022-01-18 ENCOUNTER — Other Ambulatory Visit: Payer: Self-pay

## 2022-01-18 DIAGNOSIS — Z9181 History of falling: Secondary | ICD-10-CM

## 2022-01-18 DIAGNOSIS — R072 Precordial pain: Secondary | ICD-10-CM | POA: Insufficient documentation

## 2022-01-18 DIAGNOSIS — M6281 Muscle weakness (generalized): Secondary | ICD-10-CM

## 2022-01-18 DIAGNOSIS — R2689 Other abnormalities of gait and mobility: Secondary | ICD-10-CM | POA: Insufficient documentation

## 2022-01-18 DIAGNOSIS — M25551 Pain in right hip: Secondary | ICD-10-CM

## 2022-01-18 DIAGNOSIS — M25672 Stiffness of left ankle, not elsewhere classified: Secondary | ICD-10-CM

## 2022-01-18 NOTE — Therapy (Signed)
OUTPATIENT PHYSICAL THERAPY NEURO EVALUATION   Patient Name: Danielle Garrett MRN: [id number] DOB:1950-04-23, 71 y.o., female Today's Date: 01/18/2022   PCP: London Pepper MD REFERRING PROVIDER: London Pepper MD   PT End of Session - 01/18/22 0917     Visit Number 1    Date for PT Re-Evaluation 03/15/22    Authorization Type Medicare    PT Start Time 0915    PT Stop Time 1000    PT Time Calculation (min) 45 min    Activity Tolerance Patient tolerated treatment well             Past Medical History:  Diagnosis Date   Allergic rhinitis due to other allergen    Anginal pain ([place]) 2011   hospitalized for CP in the past; due to low potassium   Anxiety    Arthritis    Childhood asthma    Chronic hoarseness    Gastroparesis    GERD (gastroesophageal reflux disease)    History of hiatal hernia    Hx of blood transfusion reaction    in the early 80s   Hypertension    Left bundle branch block     (abnormal heart rhythm)   PONV (postoperative nausea and vomiting)    Pyloric stenosis    Past Surgical History:  Procedure Laterality Date   [place]  10/2009   Cardiac cath normal coronary arteries   CERVICAL DISC ARTHROPLASTY     disc replacement in neck 1998   COLONOSCOPY     10 year repeat 09/26/2010   ESOPHAGOGASTRODUODENOSCOPY  09/26/2010,01/08/13   LOOP RECORDER INSERTION N/A 05/29/2016   Procedure: Loop Recorder Insertion;  Surgeon: Evans Lance, MD;  Location: [facility];  Service: Cardiovascular;  Laterality: N/A;   OTHER SURGICAL HISTORY  1983   hysterectomy ovaries intact, prolapsed    OTHER SURGICAL HISTORY  2006   pyloric stricture surgery   TOTAL HIP ARTHROPLASTY Right 06/07/2015   Procedure: TOTAL HIP ARTHROPLASTY ANTERIOR APPROACH;  Surgeon: Frederik Pear, MD;  Location: [facility];  Service: Orthopedics;  Laterality: Right;   Patient Active Problem List   Diagnosis Date Noted   Autonomic dysfunction  09/28/2021   Atrial fibrillation with tachycardic ventricular rate ([place]) 05/29/2016   Syncope, cardiogenic 05/24/2016   Primary osteoarthritis of right hip 06/05/2015   Pre-operative clearance 06/02/2015   Chest pain 06/02/2015   LBBB (left bundle branch block) 06/02/2015   History of left hip replacement 06/02/2015   Palpitation 10/07/2014   Constipation 08/23/2013   Nausea 08/23/2013    ONSET DATE: 04/17/21 REFERRING DIAG: R26.89 Loss of balance  THERAPY DIAG:  Decreased balance; weakness; risk of falls; right hip pain; left ankle stiffness   Rationale for Evaluation and Treatment Rehabilitation  SUBJECTIVE:  SUBJECTIVE STATEMENT: Referred to PT for decreased balance;  In January went to [facility] to help a friend and had a spell causing loss of consciousness (resulted in left ankle fracture).  Reduced BP med, want it to be elevated. Since took off 1 medicine 3 months ago no spell.    Wears a smart watch to monitor heart rate (fluctuates a lot).  Think BP plummets?   Difficulty with decline walking painful with left hip;  had rehab for ankle but not balance;  decreased balance with turning quickly; bending over or twisting affects balance  PERTINENT HISTORY:  right THR 4-5 years ago "didn't heal correctly";   ankle fracture  plates/screws in Jan Dr. Lucia Gaskins follows and says one spot not fully healed (proposed future surgery) had 1 injection which helped pain;  hearing loss; HTN ( taken off 1 med, cardiologist wants BP a little elevated)  Started on [place] yesterday for osteoporosis  Goes by [place]: MONITOR BLOOD PRESSURE; SUPINE WITH LEGS ELEVATED  PAIN:  Are you having pain? Yes NPRS scale: 4/10 Pain location: posterior hip and thigh pain with standing  prolonged    Aggravating factors: sitting Relieving factors: at rest    PRECAUTIONS: Fall  WEIGHT BEARING RESTRICTIONS No  FALLS: Has patient fallen in last 6 months? No  LIVING ENVIRONMENT: Lives with: lives with their spouse Lives in: House/apartment Stairs: No PLOF: Independent  PATIENT GOALS less pain; balance "people think I'm drunk sometimes"   OBJECTIVE:  BLOOD PRESSURE:  151/101  DIAGNOSTIC FINDINGS: hip x-ray after surgery  COGNITION: Overall cognitive status: Within functional limits for tasks assessed   MUSCLE LENGTH: Decreased hip flexor lengths right > left  POSTURE:  left calf muscle atrophy  LOWER EXTREMITY ROM:   decreased right hip external rotation; decreased left ankle dorsiflexion to 4 degrees  LOWER EXTREMITY MMT:    MMT Right Eval Left Eval  Hip flexion 4 4+  Hip extension 4- 4+  Hip abduction 4- 4+  Hip adduction    Hip internal rotation    Hip external rotation    Knee flexion 4+ 4+  Knee extension 4 4  Ankle dorsiflexion 5 3+  Ankle plantarflexion 5 3+  Ankle inversion 5 3+  Ankle eversion 5 3+  (Blank rows = not tested)    GAIT: Comments: no device used  FUNCTIONAL TESTs:   5 times sit to stand: 14 Right hip pain Berg Balance Scale: 42/56 Dynamic Gait Index: 11/24 Holds arms out to the side to balance with head turns, faster speeds, around obstacles SLS; left 3 sec very wobbly left ankle pain; right 5 sec also very wobbly TODAY'S TREATMENT:  Plan of care and areas of focus   PATIENT EDUCATION: Education details: plan of care  Person educated: Patient Education method: Explanation Education comprehension: verbalized understanding   HOME EXERCISE PROGRAM: To be started    GOALS: Goals reviewed with patient? Yes  SHORT TERM GOALS: Target date: 02/15/2022  The patient will demonstrate knowledge of basic self care strategies and exercises to promote healing   Baseline: Goal status: INITIAL  2.  The patient  will have improved right hip pain and strength to at least 4/5 needed for standing, walking longer distances and descending stairs at home and in the community   Baseline:  Goal status: INITIAL  3.  The patient will have an improved BERG balance score to   45 /56 indicating reduced risk of falls  Baseline:  Goal  status: INITIAL  4.  Dynamic Gait Index 14/24 Baseline:  Goal status: INITIAL   LONG TERM GOALS: Target date: 03/15/2022  The patient will be independent in a safe self progression of a home exercise program to promote further recovery of function   Baseline:  Goal status: INITIAL  2.  The patient will have improved right hip pain and strength to at least 4+/5 needed for standing, walking longer distances and descending stairs at home and in the community  Baseline:  Goal status: INITIAL  3.  Dynamic Gait Index improved to 18/24 indicating improved safety and decreased risk for falls Baseline:  Goal status: INITIAL  4.  The patient will have an improved BERG balance score to   48 /56 indicating reduced risk of falls  Baseline:  Goal status: INITIAL  5.  5x sit to stand improved to 12.5 sec without right hip pain Baseline:  Goal status: INITIAL   ASSESSMENT:  CLINICAL IMPRESSION: Patient is a 71 y.o. female who was seen today for physical therapy evaluation and treatment for loss of balance, fall risk.  Medical history is significant for autonomic dysfunction which causes loss of consciousness and caused a fracture of her left ankle in January.  Limited ankle mobility contributes to decreased balance.  Right hip pain which has persisted since her THR as well as muscle weakness in glutes also contributes too loss of balance with significant difficulty single leg standing.  BERG balance test indicates a 50% risk of falls and Dynamic Gait Index indicates a 2.5x increased fall risk.  She would benefit from PT to address these deficits.  She will need extra monitoring  secondary to autonomic dysfunction.     OBJECTIVE IMPAIRMENTS decreased balance, decreased strength, and impaired perceived functional ability.   ACTIVITY LIMITATIONS carrying, lifting, bending, standing, squatting, stairs, and locomotion level  PARTICIPATION LIMITATIONS: meal prep, cleaning, laundry, shopping, community activity, and church  PERSONAL FACTORS Past/current experiences, Time since onset of injury/illness/exacerbation, and 1-2 comorbidities: osteoporosis, autonomic dysfunction, multi regions/areas affected  are also affecting patient's functional outcome.   REHAB POTENTIAL: Good  CLINICAL DECISION MAKING: moderate EVALUATION COMPLEXITY: moderate PLAN: PT FREQUENCY: 2x/week  PT DURATION: 8 weeks  PLANNED INTERVENTIONS: Therapeutic exercises, Therapeutic activity, Neuromuscular re-education, Balance training, Gait training, Patient/Family education, Self Care, Joint mobilization, Aquatic Therapy, Dry Needling, Electrical stimulation, Cryotherapy, Moist heat, Taping, Ultrasound, Ionotophoresis '4mg'$ /ml Dexamethasone, Manual therapy, and Re-evaluation  PLAN FOR NEXT SESSION: monitor BP secondary autonomic dysfunction;  DN and manual therapy to right glutes, initiate glute strengthening; initiate calf stretch; basic balance HEP counter support   Ruben Im, PT 01/18/22 11:40 AM Phone: [phone] Fax: [phone]

## 2022-01-25 ENCOUNTER — Ambulatory Visit: Payer: Medicare Other | Admitting: Physical Therapy

## 2022-01-25 DIAGNOSIS — Z9181 History of falling: Secondary | ICD-10-CM

## 2022-01-25 DIAGNOSIS — M25551 Pain in right hip: Secondary | ICD-10-CM

## 2022-01-25 DIAGNOSIS — R072 Precordial pain: Secondary | ICD-10-CM | POA: Diagnosis not present

## 2022-01-25 DIAGNOSIS — M25672 Stiffness of left ankle, not elsewhere classified: Secondary | ICD-10-CM

## 2022-01-25 DIAGNOSIS — M6281 Muscle weakness (generalized): Secondary | ICD-10-CM

## 2022-01-25 DIAGNOSIS — R2689 Other abnormalities of gait and mobility: Secondary | ICD-10-CM | POA: Diagnosis not present

## 2022-01-25 NOTE — Therapy (Signed)
OUTPATIENT PHYSICAL THERAPY PROGRESS NOTE   Patient Name: Danielle Garrett MRN: [id number] DOB:04-Oct-1950, 71 y.o., female Today's Date: 01/25/2022   PCP: London Pepper MD REFERRING PROVIDER: London Pepper MD   PT End of Session - 01/25/22 0756     Visit Number 2    Date for PT Re-Evaluation 03/15/22    Authorization Type Medicare    PT Start Time 0800    PT Stop Time 0840    PT Time Calculation (min) 40 min    Activity Tolerance Patient tolerated treatment well             Past Medical History:  Diagnosis Date   Allergic rhinitis due to other allergen    Anginal pain ([place]) 2011   hospitalized for CP in the past; due to low potassium   Anxiety    Arthritis    Childhood asthma    Chronic hoarseness    Gastroparesis    GERD (gastroesophageal reflux disease)    History of hiatal hernia    Hx of blood transfusion reaction    in the early 80s   Hypertension    Left bundle branch block     (abnormal heart rhythm)   PONV (postoperative nausea and vomiting)    Pyloric stenosis    Past Surgical History:  Procedure Laterality Date   [place]  10/2009   Cardiac cath normal coronary arteries   CERVICAL DISC ARTHROPLASTY     disc replacement in neck 1998   COLONOSCOPY     10 year repeat 09/26/2010   ESOPHAGOGASTRODUODENOSCOPY  09/26/2010,01/08/13   LOOP RECORDER INSERTION N/A 05/29/2016   Procedure: Loop Recorder Insertion;  Surgeon: Evans Lance, MD;  Location: [facility];  Service: Cardiovascular;  Laterality: N/A;   OTHER SURGICAL HISTORY  1983   hysterectomy ovaries intact, prolapsed    OTHER SURGICAL HISTORY  2006   pyloric stricture surgery   TOTAL HIP ARTHROPLASTY Right 06/07/2015   Procedure: TOTAL HIP ARTHROPLASTY ANTERIOR APPROACH;  Surgeon: Frederik Pear, MD;  Location: [facility];  Service: Orthopedics;  Laterality: Right;   Patient Active Problem List   Diagnosis Date Noted   Autonomic dysfunction  09/28/2021   Atrial fibrillation with tachycardic ventricular rate ([place]) 05/29/2016   Syncope, cardiogenic 05/24/2016   Primary osteoarthritis of right hip 06/05/2015   Pre-operative clearance 06/02/2015   Chest pain 06/02/2015   LBBB (left bundle branch block) 06/02/2015   History of left hip replacement 06/02/2015   Palpitation 10/07/2014   Constipation 08/23/2013   Nausea 08/23/2013    ONSET DATE: 04/17/21 REFERRING DIAG: R26.89 Loss of balance  THERAPY DIAG:  Decreased balance; weakness; risk of falls; right hip pain; left ankle stiffness   Rationale for Evaluation and Treatment Rehabilitation  SUBJECTIVE:  SUBJECTIVE STATEMENT:   I've had 3-4 almost falls and needed the wall or my husband.    Most of the time I've fallen to the left.   BP 155/99  PERTINENT HISTORY:  right THR 4-5 years ago "didn't heal correctly";   ankle fracture  plates/screws in Jan Dr. Lucia Gaskins follows and says one spot not fully healed (proposed future surgery) had 1 injection which helped pain;  hearing loss; HTN ( taken off 1 med, cardiologist wants BP a little elevated)  Started on [place] yesterday for osteoporosis  Goes by [place]: MONITOR BLOOD PRESSURE; SUPINE WITH LEGS ELEVATED  PAIN:  Are you having pain? Yes NPRS scale: 2/10 Pain location:low back;  posterior hip and thigh pain with standing prolonged    Aggravating factors: sitting Relieving factors: at rest    PRECAUTIONS: Fall  WEIGHT BEARING RESTRICTIONS No  FALLS: Has patient fallen in last 6 months? No  LIVING ENVIRONMENT: Lives with: lives with their spouse Lives in: House/apartment Stairs: No PLOF: Independent  PATIENT GOALS less pain; balance "people think I'm drunk sometimes"    OBJECTIVE:  DIAGNOSTIC FINDINGS: hip x-ray after surgery  COGNITION: Overall cognitive status: Within functional limits for tasks assessed   MUSCLE LENGTH: Decreased hip flexor lengths right > left  POSTURE:  left calf muscle atrophy  LOWER EXTREMITY ROM:   decreased right hip external rotation; decreased left ankle dorsiflexion to 4 degrees  LOWER EXTREMITY MMT:    MMT Right Eval Left Eval  Hip flexion 4 4+  Hip extension 4- 4+  Hip abduction 4- 4+  Hip adduction    Hip internal rotation    Hip external rotation    Knee flexion 4+ 4+  Knee extension 4 4  Ankle dorsiflexion 5 3+  Ankle plantarflexion 5 3+  Ankle inversion 5 3+  Ankle eversion 5 3+  (Blank rows = not tested)    GAIT: Comments: no device used  FUNCTIONAL TESTs:   5 times sit to stand: 14 Right hip pain Berg Balance Scale: 42/56 Dynamic Gait Index: 11/24 Holds arms out to the side to balance with head turns, faster speeds, around obstacles SLS; left 3 sec very wobbly left ankle pain; right 5 sec also very wobbly  TODAY'S TREATMENT:  10/11: Supine bridge 20x Supine green band clams 10x Sidelying clams green band 10x Sidelying hip abduction 10x Addaday instrument assisted soft tissue mobilization to right glutes Supine piriformis stretch 2x 30 sec right/left Seated piriformis right 2x Sit to stand no hands 5x Standing calf stretch on edge of step 3x 30 sec Standing ex's at the bar: heel raises, toe raises, side stepping, backwards walk, 3 way hip WB on left; 4 inch step taps (very difficult) Neuromuscular re-education: muscle activation with verbal and tactile cues to improve stability and reduce loss of balance     PATIENT EDUCATION: Education details: plan of care  Person educated: Patient Education method: Explanation Education comprehension: verbalized understanding   HOME EXERCISE PROGRAM: To be started    GOALS: Goals reviewed with patient? Yes  SHORT TERM GOALS: Target  date: 02/15/2022  The patient will demonstrate knowledge of basic self care strategies and exercises to promote healing   Baseline: Goal status: INITIAL  2.  The patient will have improved right hip pain and strength to at least 4/5 needed for standing, walking longer distances and descending stairs at home and in the community   Baseline:  Goal status: INITIAL  3.  The patient will have an improved BERG balance score to   45 /56 indicating reduced risk of falls  Baseline:  Goal status: INITIAL  4.  Dynamic Gait Index 14/24 Baseline:  Goal status: INITIAL   LONG TERM GOALS: Target date: 03/15/2022  The patient will be independent in a safe self progression of a home exercise program to promote further recovery of function   Baseline:  Goal status: INITIAL  2.  The patient will have improved right hip pain and strength to at least 4+/5 needed for standing, walking longer distances and descending stairs at home and in the community  Baseline:  Goal status: INITIAL  3.  Dynamic Gait Index improved to 18/24 indicating improved safety and decreased risk for falls Baseline:  Goal status: INITIAL  4.  The patient will have an improved BERG balance score to   48 /56 indicating reduced risk of falls  Baseline:  Goal status: INITIAL  5.  5x sit to stand improved to 12.5 sec without right hip pain Baseline:  Goal status: INITIAL   ASSESSMENT:  CLINICAL IMPRESSION: The patient reports an increase in pain in hip and ankle but tolerable, < 5/10 during and following treatment session.  Wide base of support noted during gait.  Therapist providing close supervision and at times contact guard/min assist for safety in case of loss of balance with balance challenges.  Single leg standing on the left and turns are quite challenging.   OBJECTIVE IMPAIRMENTS decreased balance, decreased strength, and impaired perceived functional ability.   ACTIVITY LIMITATIONS carrying, lifting, bending,  standing, squatting, stairs, and locomotion level  PARTICIPATION LIMITATIONS: meal prep, cleaning, laundry, shopping, community activity, and church  PERSONAL FACTORS Past/current experiences, Time since onset of injury/illness/exacerbation, and 1-2 comorbidities: osteoporosis, autonomic dysfunction, multi regions/areas affected  are also affecting patient's functional outcome.   REHAB POTENTIAL: Good  CLINICAL DECISION MAKING: moderate EVALUATION COMPLEXITY: moderate PLAN: PT FREQUENCY: 2x/week  PT DURATION: 8 weeks  PLANNED INTERVENTIONS: Therapeutic exercises, Therapeutic activity, Neuromuscular re-education, Balance training, Gait training, Patient/Family education, Self Care, Joint mobilization, Aquatic Therapy, Dry Needling, Electrical stimulation, Cryotherapy, Moist heat, Taping, Ultrasound, Ionotophoresis '4mg'$ /ml Dexamethasone, Manual therapy, and Re-evaluation  PLAN FOR NEXT SESSION: monitor BP secondary autonomic dysfunction;  DN and manual therapy to right glutes, glute strengthening;  calf stretch;  balance; gait belt for safety  Ruben Im, PT 01/25/22 7:34 PM Phone: [phone] Fax: [phone]

## 2022-01-26 ENCOUNTER — Ambulatory Visit: Payer: Medicare Other | Admitting: Physical Therapy

## 2022-01-26 DIAGNOSIS — M25551 Pain in right hip: Secondary | ICD-10-CM

## 2022-01-26 DIAGNOSIS — R072 Precordial pain: Secondary | ICD-10-CM | POA: Diagnosis not present

## 2022-01-26 DIAGNOSIS — Z9181 History of falling: Secondary | ICD-10-CM

## 2022-01-26 DIAGNOSIS — M25672 Stiffness of left ankle, not elsewhere classified: Secondary | ICD-10-CM

## 2022-01-26 DIAGNOSIS — M6281 Muscle weakness (generalized): Secondary | ICD-10-CM

## 2022-01-26 DIAGNOSIS — R2689 Other abnormalities of gait and mobility: Secondary | ICD-10-CM | POA: Diagnosis not present

## 2022-01-26 NOTE — Therapy (Signed)
OUTPATIENT PHYSICAL THERAPY PROGRESS NOTE   Patient Name: Danielle Garrett MRN: [id number] DOB:10-30-1950, 71 y.o., female Today's Date: 01/26/2022   PCP: London Pepper MD REFERRING PROVIDER: London Pepper MD   PT End of Session - 01/26/22 1106     Visit Number 3    Date for PT Re-Evaluation 03/15/22    Authorization Type Medicare    PT Start Time 1102    PT Stop Time 1142    PT Time Calculation (min) 40 min    Activity Tolerance Patient tolerated treatment well             Past Medical History:  Diagnosis Date   Allergic rhinitis due to other allergen    Anginal pain ([place]) 2011   hospitalized for CP in the past; due to low potassium   Anxiety    Arthritis    Childhood asthma    Chronic hoarseness    Gastroparesis    GERD (gastroesophageal reflux disease)    History of hiatal hernia    Hx of blood transfusion reaction    in the early 80s   Hypertension    Left bundle branch block     (abnormal heart rhythm)   PONV (postoperative nausea and vomiting)    Pyloric stenosis    Past Surgical History:  Procedure Laterality Date   [place]  10/2009   Cardiac cath normal coronary arteries   CERVICAL DISC ARTHROPLASTY     disc replacement in neck 1998   COLONOSCOPY     10 year repeat 09/26/2010   ESOPHAGOGASTRODUODENOSCOPY  09/26/2010,01/08/13   LOOP RECORDER INSERTION N/A 05/29/2016   Procedure: Loop Recorder Insertion;  Surgeon: Evans Lance, MD;  Location: [facility];  Service: Cardiovascular;  Laterality: N/A;   OTHER SURGICAL HISTORY  1983   hysterectomy ovaries intact, prolapsed    OTHER SURGICAL HISTORY  2006   pyloric stricture surgery   TOTAL HIP ARTHROPLASTY Right 06/07/2015   Procedure: TOTAL HIP ARTHROPLASTY ANTERIOR APPROACH;  Surgeon: Frederik Pear, MD;  Location: [facility];  Service: Orthopedics;  Laterality: Right;   Patient Active Problem List   Diagnosis Date Noted   Autonomic dysfunction  09/28/2021   Atrial fibrillation with tachycardic ventricular rate ([place]) 05/29/2016   Syncope, cardiogenic 05/24/2016   Primary osteoarthritis of right hip 06/05/2015   Pre-operative clearance 06/02/2015   Chest pain 06/02/2015   LBBB (left bundle branch block) 06/02/2015   History of left hip replacement 06/02/2015   Palpitation 10/07/2014   Constipation 08/23/2013   Nausea 08/23/2013    ONSET DATE: 04/17/21 REFERRING DIAG: R26.89 Loss of balance  THERAPY DIAG:  Decreased balance; weakness; risk of falls; right hip pain; left ankle stiffness   Rationale for Evaluation and Treatment Rehabilitation  SUBJECTIVE:  SUBJECTIVE STATEMENT:   My hip hurt a little and my ankle but not too bad.    I took my BP medicine right before I came.  Off balance with bending over or when turning head when standing.  No symptoms with sit to stand or turning over in bed.   BP 169/98  PERTINENT HISTORY:  right THR 4-5 years ago "didn't heal correctly";   ankle fracture  plates/screws in Jan Dr. Lucia Gaskins follows and says one spot not fully healed (proposed future surgery) had 1 injection which helped pain;  hearing loss; HTN ( taken off 1 med, cardiologist wants BP a little elevated)  Started on [place] yesterday for osteoporosis  Goes by [place]: MONITOR BLOOD PRESSURE; SUPINE WITH LEGS ELEVATED  PAIN:  Are you having pain? Yes NPRS scale: 2/10 Pain location:low back;  posterior hip and thigh pain with standing prolonged    Aggravating factors: sitting Relieving factors: at rest    PRECAUTIONS: Fall  WEIGHT BEARING RESTRICTIONS No  FALLS: Has patient fallen in last 6 months? No  LIVING ENVIRONMENT: Lives with: lives with their spouse Lives in:  House/apartment Stairs: No PLOF: Independent  PATIENT GOALS less pain; balance "people think I'm drunk sometimes"   OBJECTIVE:  DIAGNOSTIC FINDINGS: hip x-ray after surgery  COGNITION: Overall cognitive status: Within functional limits for tasks assessed   MUSCLE LENGTH: Decreased hip flexor lengths right > left  POSTURE:  left calf muscle atrophy  LOWER EXTREMITY ROM:   decreased right hip external rotation; decreased left ankle dorsiflexion to 4 degrees  LOWER EXTREMITY MMT:    MMT Right Eval Left Eval  Hip flexion 4 4+  Hip extension 4- 4+  Hip abduction 4- 4+  Hip adduction    Hip internal rotation    Hip external rotation    Knee flexion 4+ 4+  Knee extension 4 4  Ankle dorsiflexion 5 3+  Ankle plantarflexion 5 3+  Ankle inversion 5 3+  Ankle eversion 5 3+  (Blank rows = not tested)    GAIT: Comments: no device used  FUNCTIONAL TESTs:   5 times sit to stand: 14 Right hip pain Berg Balance Scale: 42/56 Dynamic Gait Index: 11/24 Holds arms out to the side to balance with head turns, faster speeds, around obstacles SLS; left 3 sec very wobbly left ankle pain; right 5 sec also very wobbly  TODAY'S TREATMENT:  10/12: Nu-Step L1 3 min while discussing status 2nd step stretch 6 inch step up 10x right/left 6 inch and 12 step taps 8x right/left At the bar: circle taps with toes random and with dual task/cognitive component At the bar: number reaches random and with dual task Green band Pallof press, stir the pot (very challenging and compensates with wide base of support) Neuromuscular re-education: muscle activation with verbal and tactile cues to improve stability and reduce loss of balance        10/11: Supine bridge 20x Supine green band clams 10x Sidelying clams green band 10x Sidelying hip abduction 10x Addaday instrument assisted soft tissue mobilization to right glutes Supine piriformis stretch 2x 30 sec right/left Seated piriformis right  2x Sit to stand no hands 5x Standing calf stretch on edge of step 3x 30 sec Standing ex's at the bar: heel raises, toe raises, side stepping, backwards walk, 3 way hip WB on left; 4 inch step taps (very difficult) Neuromuscular re-education: muscle activation with verbal and tactile cues to improve stability and reduce loss  of balance     PATIENT EDUCATION: Education details: plan of care  Person educated: Patient Education method: Explanation Education comprehension: verbalized understanding   HOME EXERCISE PROGRAM:   GOALS: Goals reviewed with patient? Yes  SHORT TERM GOALS: Target date: 02/15/2022  The patient will demonstrate knowledge of basic self care strategies and exercises to promote healing   Baseline: Goal status: INITIAL  2.  The patient will have improved right hip pain and strength to at least 4/5 needed for standing, walking longer distances and descending stairs at home and in the community   Baseline:  Goal status: INITIAL  3.  The patient will have an improved BERG balance score to   45 /56 indicating reduced risk of falls  Baseline:  Goal status: INITIAL  4.  Dynamic Gait Index 14/24 Baseline:  Goal status: INITIAL   LONG TERM GOALS: Target date: 03/15/2022  The patient will be independent in a safe self progression of a home exercise program to promote further recovery of function   Baseline:  Goal status: INITIAL  2.  The patient will have improved right hip pain and strength to at least 4+/5 needed for standing, walking longer distances and descending stairs at home and in the community  Baseline:  Goal status: INITIAL  3.  Dynamic Gait Index improved to 18/24 indicating improved safety and decreased risk for falls Baseline:  Goal status: INITIAL  4.  The patient will have an improved BERG balance score to   48 /56 indicating reduced risk of falls  Baseline:  Goal status: INITIAL  5.  5x sit to stand improved to 12.5 sec without right  hip pain Baseline:  Goal status: INITIAL   ASSESSMENT:  CLINICAL IMPRESSION:   There were 5 episodes of loss of balance requiring moderate physical assistance to regain stability during session.  The patient uses a wide base of support and arms out to the side to compensate for instability.  Balance disturbance is multifactoral with musculoskeletal, single leg and possible vestibular component.     OBJECTIVE IMPAIRMENTS decreased balance, decreased strength, and impaired perceived functional ability.   ACTIVITY LIMITATIONS carrying, lifting, bending, standing, squatting, stairs, and locomotion level  PARTICIPATION LIMITATIONS: meal prep, cleaning, laundry, shopping, community activity, and church  PERSONAL FACTORS Past/current experiences, Time since onset of injury/illness/exacerbation, and 1-2 comorbidities: osteoporosis, autonomic dysfunction, multi regions/areas affected  are also affecting patient's functional outcome.   REHAB POTENTIAL: Good  CLINICAL DECISION MAKING: moderate EVALUATION COMPLEXITY: moderate PLAN: PT FREQUENCY: 2x/week  PT DURATION: 8 weeks  PLANNED INTERVENTIONS: Therapeutic exercises, Therapeutic activity, Neuromuscular re-education, Balance training, Gait training, Patient/Family education, Self Care, Joint mobilization, Aquatic Therapy, Dry Needling, Electrical stimulation, Cryotherapy, Moist heat, Taping, Ultrasound, Ionotophoresis '4mg'$ /ml Dexamethasone, Manual therapy, and Re-evaluation  PLAN FOR NEXT SESSION: monitor BP secondary autonomic dysfunction;  DN and manual therapy to right glutes, glute strengthening;  calf stretch;  balance; gait belt for safety Ruben Im, PT 01/26/22 12:44 PM Phone: [phone] Fax: [phone]

## 2022-02-01 ENCOUNTER — Ambulatory Visit: Payer: Medicare Other | Admitting: Physical Therapy

## 2022-02-01 DIAGNOSIS — M6281 Muscle weakness (generalized): Secondary | ICD-10-CM

## 2022-02-01 DIAGNOSIS — M25672 Stiffness of left ankle, not elsewhere classified: Secondary | ICD-10-CM

## 2022-02-01 DIAGNOSIS — R2689 Other abnormalities of gait and mobility: Secondary | ICD-10-CM | POA: Diagnosis not present

## 2022-02-01 DIAGNOSIS — M25551 Pain in right hip: Secondary | ICD-10-CM

## 2022-02-01 DIAGNOSIS — Z9181 History of falling: Secondary | ICD-10-CM

## 2022-02-01 DIAGNOSIS — R072 Precordial pain: Secondary | ICD-10-CM | POA: Diagnosis not present

## 2022-02-01 NOTE — Therapy (Signed)
OUTPATIENT PHYSICAL THERAPY PROGRESS NOTE   Patient Name: Danielle Garrett MRN: [id number] DOB:[birth year], 71 y.o., female Today's Date: 02/01/2022   PCP: London Pepper MD REFERRING PROVIDER: London Pepper MD   PT End of Session - 02/01/22 0935     Visit Number 4    Date for PT Re-Evaluation 03/15/22    Authorization Type Medicare    PT Start Time 0931    PT Stop Time 2703    PT Time Calculation (min) 43 min    Activity Tolerance Patient tolerated treatment well             Past Medical History:  Diagnosis Date   Allergic rhinitis due to other allergen    Anginal pain ([place]) 2011   hospitalized for CP in the past; due to low potassium   Anxiety    Arthritis    Childhood asthma    Chronic hoarseness    Gastroparesis    GERD (gastroesophageal reflux disease)    History of hiatal hernia    Hx of blood transfusion reaction    in the early 80s   Hypertension    Left bundle branch block     (abnormal heart rhythm)   PONV (postoperative nausea and vomiting)    Pyloric stenosis    Past Surgical History:  Procedure Laterality Date   [place]  10/2009   Cardiac cath normal coronary arteries   CERVICAL DISC ARTHROPLASTY     disc replacement in neck 1998   COLONOSCOPY     10 year repeat 09/26/2010   ESOPHAGOGASTRODUODENOSCOPY  09/26/2010,01/08/13   LOOP RECORDER INSERTION N/A 05/29/2016   Procedure: Loop Recorder Insertion;  Surgeon: Evans Lance, MD;  Location: [facility];  Service: Cardiovascular;  Laterality: N/A;   OTHER SURGICAL HISTORY  1983   hysterectomy ovaries intact, prolapsed    OTHER SURGICAL HISTORY  2006   pyloric stricture surgery   TOTAL HIP ARTHROPLASTY Right 06/07/2015   Procedure: TOTAL HIP ARTHROPLASTY ANTERIOR APPROACH;  Surgeon: Frederik Pear, MD;  Location: [facility];  Service: Orthopedics;  Laterality: Right;   Patient Active Problem List   Diagnosis Date Noted   Autonomic dysfunction  09/28/2021   Atrial fibrillation with tachycardic ventricular rate ([place]) 05/29/2016   Syncope, cardiogenic 05/24/2016   Primary osteoarthritis of right hip 06/05/2015   Pre-operative clearance 06/02/2015   Chest pain 06/02/2015   LBBB (left bundle branch block) 06/02/2015   History of left hip replacement 06/02/2015   Palpitation 10/07/2014   Constipation 08/23/2013   Nausea 08/23/2013    ONSET DATE: 04/17/21 REFERRING DIAG: R26.89 Loss of balance  THERAPY DIAG:  Decreased balance; weakness; risk of falls; right hip pain; left ankle stiffness   Rationale for Evaluation and Treatment Rehabilitation  SUBJECTIVE:  SUBJECTIVE STATEMENT:   Went to [place]. [place], [place].  Several episodes of close calls with loss of balance with turning and changing directions.  No dizziness with head tilted back at the hair dresser.  Ankle sore from lots of walking this week.  Had a spell in the past while sitting and turned head to the right.   BP 161/90  PERTINENT HISTORY:  right THR 4-5 years ago "didn't heal correctly";   ankle fracture  plates/screws in Jan Dr. Lucia Gaskins follows and says one spot not fully healed (proposed future surgery) had 1 injection which helped pain;  hearing loss; HTN ( taken off 1 med, cardiologist wants BP a little elevated)  Started on [place] yesterday for osteoporosis  Goes by [place]: MONITOR BLOOD PRESSURE; SUPINE WITH LEGS ELEVATED  PAIN:  Are you having pain? Yes NPRS scale: 2/10 Pain location:low back;  posterior hip and thigh pain with standing prolonged    Aggravating factors: sitting Relieving factors: at rest    PRECAUTIONS: Fall  WEIGHT BEARING RESTRICTIONS No  FALLS: Has patient fallen in last 6  months? No  LIVING ENVIRONMENT: Lives with: lives with their spouse Lives in: House/apartment Stairs: No PLOF: Independent  PATIENT GOALS less pain; balance "people think I'm drunk sometimes"   OBJECTIVE:  DIAGNOSTIC FINDINGS: hip x-ray after surgery  COGNITION: Overall cognitive status: Within functional limits for tasks assessed   MUSCLE LENGTH: Decreased hip flexor lengths right > left  POSTURE:  left calf muscle atrophy  LOWER EXTREMITY ROM:   decreased right hip external rotation; decreased left ankle dorsiflexion to 4 degrees  LOWER EXTREMITY MMT:    MMT Right Eval Left Eval  Hip flexion 4 4+  Hip extension 4- 4+  Hip abduction 4- 4+  Hip adduction    Hip internal rotation    Hip external rotation    Knee flexion 4+ 4+  Knee extension 4 4  Ankle dorsiflexion 5 3+  Ankle plantarflexion 5 3+  Ankle inversion 5 3+  Ankle eversion 5 3+  (Blank rows = not tested)    GAIT: Comments: no device used  FUNCTIONAL TESTs:   5 times sit to stand: 14 Right hip pain Berg Balance Scale: 42/56 Dynamic Gait Index: 11/24 Holds arms out to the side to balance with head turns, faster speeds, around obstacles SLS; left 3 sec very wobbly left ankle pain; right 5 sec also very wobbly  TODAY'S TREATMENT:  10/18:  Neuromuscular re-education: muscle activation with verbal and tactile cues to improve stability and reduce loss of balance Staggered stance left foot forward dec balance WB on left with right foot on 1st step with dec balance SLS on right with ease Feet close together with number reaches (initially difficult but improves) Turning 1/4 turns with reaching multi plane, 1/2 turns with reaching Red band seated ankle band ex's small ROM DF, inversion,eversion 10x each; discussed toe scrunches for instrinsics 2nd step calf and hip flexor stretch with UE raises 5x right/left    10/12: Nu-Step L1 3 min while discussing status 2nd step stretch 6 inch step up 10x  right/left 6 inch and 12 step taps 8x right/left At the bar: circle taps with toes random and with dual task/cognitive component At the bar: number reaches random and with dual task Green band Pallof press, stir the pot (very challenging and compensates with wide base of support) Neuromuscular re-education: muscle activation with verbal and tactile cues to  improve stability and reduce loss of balance        10/11: Supine bridge 20x Supine green band clams 10x Sidelying clams green band 10x Sidelying hip abduction 10x Addaday instrument assisted soft tissue mobilization to right glutes Supine piriformis stretch 2x 30 sec right/left Seated piriformis right 2x Sit to stand no hands 5x Standing calf stretch on edge of step 3x 30 sec Standing ex's at the bar: heel raises, toe raises, side stepping, backwards walk, 3 way hip WB on left; 4 inch step taps (very difficult) Neuromuscular re-education: muscle activation with verbal and tactile cues to improve stability and reduce loss of balance     PATIENT EDUCATION: Education details: plan of care  Person educated: Patient Education method: Explanation Education comprehension: verbalized understanding   HOME EXERCISE PROGRAM:   GOALS: Goals reviewed with patient? Yes  SHORT TERM GOALS: Target date: 02/15/2022  The patient will demonstrate knowledge of basic self care strategies and exercises to promote healing   Baseline: Goal status: INITIAL  2.  The patient will have improved right hip pain and strength to at least 4/5 needed for standing, walking longer distances and descending stairs at home and in the community   Baseline:  Goal status: INITIAL  3.  The patient will have an improved BERG balance score to   45 /56 indicating reduced risk of falls  Baseline:  Goal status: INITIAL  4.  Dynamic Gait Index 14/24 Baseline:  Goal status: INITIAL   LONG TERM GOALS: Target date: 03/15/2022  The patient will be  independent in a safe self progression of a home exercise program to promote further recovery of function   Baseline:  Goal status: INITIAL  2.  The patient will have improved right hip pain and strength to at least 4+/5 needed for standing, walking longer distances and descending stairs at home and in the community  Baseline:  Goal status: INITIAL  3.  Dynamic Gait Index improved to 18/24 indicating improved safety and decreased risk for falls Baseline:  Goal status: INITIAL  4.  The patient will have an improved BERG balance score to   48 /56 indicating reduced risk of falls  Baseline:  Goal status: INITIAL  5.  5x sit to stand improved to 12.5 sec without right hip pain Baseline:  Goal status: INITIAL   ASSESSMENT:  CLINICAL IMPRESSION:   Moderate compensatory strategies used to stabilize including wide base of support and arms abducted to the side.  Considerable asymmetry between right/left with left stability decreased more so.  Mod to max assist from therapist to stabilize initially with feet close together but improves with time and repetition.     OBJECTIVE IMPAIRMENTS decreased balance, decreased strength, and impaired perceived functional ability.   ACTIVITY LIMITATIONS carrying, lifting, bending, standing, squatting, stairs, and locomotion level  PARTICIPATION LIMITATIONS: meal prep, cleaning, laundry, shopping, community activity, and church  PERSONAL FACTORS Past/current experiences, Time since onset of injury/illness/exacerbation, and 1-2 comorbidities: osteoporosis, autonomic dysfunction, multi regions/areas affected  are also affecting patient's functional outcome.   REHAB POTENTIAL: Good  CLINICAL DECISION MAKING: moderate EVALUATION COMPLEXITY: moderate PLAN: PT FREQUENCY: 2x/week  PT DURATION: 8 weeks  PLANNED INTERVENTIONS: Therapeutic exercises, Therapeutic activity, Neuromuscular re-education, Balance training, Gait training, Patient/Family education,  Self Care, Joint mobilization, Aquatic Therapy, Dry Needling, Electrical stimulation, Cryotherapy, Moist heat, Taping, Ultrasound, Ionotophoresis '4mg'$ /ml Dexamethasone, Manual therapy, and Re-evaluation  PLAN FOR NEXT SESSION: monitor BP secondary autonomic dysfunction;  DN and manual therapy to right glutes, glute  strengthening;  calf stretch;  balance narrow base of support, head and body turns; gait belt for safety   Ruben Im, PT 02/01/22 9:36 AM Phone: [phone] Fax: [phone]

## 2022-02-02 ENCOUNTER — Ambulatory Visit: Payer: Medicare Other | Attending: Internal Medicine | Admitting: Internal Medicine

## 2022-02-02 ENCOUNTER — Ambulatory Visit: Payer: Medicare Other | Admitting: Physical Therapy

## 2022-02-02 ENCOUNTER — Encounter: Payer: Self-pay | Admitting: Internal Medicine

## 2022-02-02 VITALS — BP 146/78 | HR 82 | Ht 60.0 in | Wt 116.2 lb

## 2022-02-02 DIAGNOSIS — R072 Precordial pain: Secondary | ICD-10-CM | POA: Diagnosis not present

## 2022-02-02 DIAGNOSIS — R55 Syncope and collapse: Secondary | ICD-10-CM | POA: Diagnosis not present

## 2022-02-02 DIAGNOSIS — I447 Left bundle-branch block, unspecified: Secondary | ICD-10-CM | POA: Diagnosis not present

## 2022-02-02 DIAGNOSIS — M25672 Stiffness of left ankle, not elsewhere classified: Secondary | ICD-10-CM

## 2022-02-02 DIAGNOSIS — M6281 Muscle weakness (generalized): Secondary | ICD-10-CM

## 2022-02-02 DIAGNOSIS — Z9181 History of falling: Secondary | ICD-10-CM

## 2022-02-02 DIAGNOSIS — M25551 Pain in right hip: Secondary | ICD-10-CM

## 2022-02-02 DIAGNOSIS — R2689 Other abnormalities of gait and mobility: Secondary | ICD-10-CM | POA: Diagnosis not present

## 2022-02-02 NOTE — Patient Instructions (Addendum)
Medication Instructions:  Your physician recommends that you continue on your current medications as directed. Please refer to the Current Medication list given to you today.  *If you need a refill on your cardiac medications before your next appointment, please call your pharmacy*  Lab Work: None ordered.  If you have labs (blood work) drawn today and your tests are completely normal, you will receive your results only by: [place] (if you have MyChart) OR A paper copy in the mail If you have any lab test that is abnormal or we need to change your treatment, we will call you to review the results.  Testing/Procedures: None ordered.  Follow-Up: At [facility], you and your health needs are our priority.  As part of our continuing mission to provide you with exceptional heart care, we have created designated Provider Care Teams.  These Care Teams include your primary Cardiologist (physician) and Advanced Practice Providers (APPs -  Physician Assistants and Nurse Practitioners) who all work together to provide you with the care you need, when you need it.  We recommend signing up for the patient portal called "MyChart".  Sign up information is provided on this After Visit Summary.  MyChart is used to connect with patients for Virtual Visits (Telemedicine).  Patients are able to view lab/test results, encounter notes, upcoming appointments, etc.  Non-urgent messages can be sent to your provider as well.   To learn more about what you can do with MyChart, go to [email or web address].    Your next appointment:   6 Months with Dr. [name] follow up.   The format for your next appointment:   In Person  Provider:   [name], MD{or one of the following Advanced Practice Providers on your designated Care Team:   Tommye Standard, [place] Legrand Como "Jonni Sanger" Chalmers Cater, [place]   Important Information About [place]

## 2022-02-02 NOTE — Therapy (Signed)
OUTPATIENT PHYSICAL THERAPY PROGRESS NOTE   Patient Name: Danielle Garrett MRN: [id number] DOB:1950-07-20, 71 y.o., female Today's Date: 02/02/2022   PCP: London Pepper MD REFERRING PROVIDER: London Pepper MD   PT End of Session - 02/02/22 0838     Visit Number 5    Date for PT Re-Evaluation 03/15/22    Authorization Type Medicare    PT Start Time 0845    PT Stop Time 0926    PT Time Calculation (min) 41 min    Activity Tolerance Patient tolerated treatment well             Past Medical History:  Diagnosis Date   Allergic rhinitis due to other allergen    Anginal pain ([place]) 2011   hospitalized for CP in the past; due to low potassium   Anxiety    Arthritis    Childhood asthma    Chronic hoarseness    Gastroparesis    GERD (gastroesophageal reflux disease)    History of hiatal hernia    Hx of blood transfusion reaction    in the early 80s   Hypertension    Left bundle branch block     (abnormal heart rhythm)   PONV (postoperative nausea and vomiting)    Pyloric stenosis    Past Surgical History:  Procedure Laterality Date   [place]  10/2009   Cardiac cath normal coronary arteries   CERVICAL DISC ARTHROPLASTY     disc replacement in neck 1998   COLONOSCOPY     10 year repeat 09/26/2010   ESOPHAGOGASTRODUODENOSCOPY  09/26/2010,01/08/13   LOOP RECORDER INSERTION N/A 05/29/2016   Procedure: Loop Recorder Insertion;  Surgeon: Evans Lance, MD;  Location: [facility];  Service: Cardiovascular;  Laterality: N/A;   OTHER SURGICAL HISTORY  1983   hysterectomy ovaries intact, prolapsed    OTHER SURGICAL HISTORY  2006   pyloric stricture surgery   TOTAL HIP ARTHROPLASTY Right 06/07/2015   Procedure: TOTAL HIP ARTHROPLASTY ANTERIOR APPROACH;  Surgeon: Frederik Pear, MD;  Location: [facility];  Service: Orthopedics;  Laterality: Right;   Patient Active Problem List   Diagnosis Date Noted   Autonomic dysfunction  09/28/2021   Atrial fibrillation with tachycardic ventricular rate ([place]) 05/29/2016   Syncope, cardiogenic 05/24/2016   Primary osteoarthritis of right hip 06/05/2015   Pre-operative clearance 06/02/2015   Chest pain 06/02/2015   LBBB (left bundle branch block) 06/02/2015   History of left hip replacement 06/02/2015   Palpitation 10/07/2014   Constipation 08/23/2013   Nausea 08/23/2013    ONSET DATE: 04/17/21 REFERRING DIAG: R26.89 Loss of balance  THERAPY DIAG:  Decreased balance; weakness; risk of falls; right hip pain; left ankle stiffness   Rationale for Evaluation and Treatment Rehabilitation  SUBJECTIVE:  SUBJECTIVE STATEMENT:   I wore my high top shoes today to see if that helps.  The top of my foot was sore last night but fine this morning.   BP: 163/100   PERTINENT HISTORY:  right THR 4-5 years ago "didn't heal correctly";   ankle fracture  plates/screws in Jan Dr. Lucia Gaskins follows and says one spot not fully healed (proposed future surgery) had 1 injection which helped pain;  hearing loss; HTN ( taken off 1 med, cardiologist wants BP a little elevated)  Started on [place] yesterday for osteoporosis  Goes by [place]: MONITOR BLOOD PRESSURE; SUPINE WITH LEGS ELEVATED  PAIN:  Are you having pain? Yes NPRS scale: 2/10 Pain location:low back;  posterior hip and thigh pain with standing prolonged    Aggravating factors: sitting Relieving factors: at rest    PRECAUTIONS: Fall  WEIGHT BEARING RESTRICTIONS No  FALLS: Has patient fallen in last 6 months? No  LIVING ENVIRONMENT: Lives with: lives with their spouse Lives in: House/apartment Stairs: No PLOF: Independent  PATIENT GOALS less pain; balance "people think I'm drunk  sometimes"   OBJECTIVE:  DIAGNOSTIC FINDINGS: hip x-ray after surgery  COGNITION: Overall cognitive status: Within functional limits for tasks assessed   MUSCLE LENGTH: Decreased hip flexor lengths right > left  POSTURE:  left calf muscle atrophy  LOWER EXTREMITY ROM:   decreased right hip external rotation; decreased left ankle dorsiflexion to 4 degrees  LOWER EXTREMITY MMT:    MMT Right Eval Left Eval  Hip flexion 4 4+  Hip extension 4- 4+  Hip abduction 4- 4+  Hip adduction    Hip internal rotation    Hip external rotation    Knee flexion 4+ 4+  Knee extension 4 4  Ankle dorsiflexion 5 3+  Ankle plantarflexion 5 3+  Ankle inversion 5 3+  Ankle eversion 5 3+  (Blank rows = not tested)    GAIT: Comments: no device used  FUNCTIONAL TESTs:   5 times sit to stand: 14 Right hip pain Berg Balance Scale: 42/56 Dynamic Gait Index: 11/24 Holds arms out to the side to balance with head turns, faster speeds, around obstacles SLS; left 3 sec very wobbly left ankle pain; right 5 sec also very wobbly  TODAY'S TREATMENT:  10/19: Seated rocker board 1 min Seated 5# kbell up and over cone Seated 5# kbell heel raise Sit to stand holding 5# kbell 5x Chair sit ups holding 5# kbell 10x Neuromuscular re-education: all performed with gait belt on, muscle activation with verbal and tactile cues to improve stability and reduce loss of balance Head turns with narrow base of support Backwards walk next to railing Sidestepping at the railing Balloon taps to wall 3 D lunges facing wall Wall pull aways using anterior tib 20x Turns 1/4,1/2 and full turns, UE reaching, head turns      10/18:  Neuromuscular re-education: muscle activation with verbal and tactile cues to improve stability and reduce loss of balance Staggered stance left foot forward dec balance WB on left with right foot on 1st step with dec balance SLS on right with ease Feet close together with number  reaches (initially difficult but improves) Turning 1/4 turns with reaching multi plane, 1/2 turns with reaching Red band seated ankle band ex's small ROM DF, inversion,eversion 10x each; discussed toe scrunches for instrinsics 2nd step calf and hip flexor stretch with UE raises 5x right/left     PATIENT EDUCATION: Education details: plan  of care  Person educated: Patient Education method: Explanation Education comprehension: verbalized understanding   HOME EXERCISE PROGRAM: Access Code: [email or web address] URL: [email or web address] Date: 02/02/2022 Prepared by: Ruben Im  Exercises - Hip Flexor Stretch at Edge of Bed (Mirrored)  - 1 x daily - 7 x weekly - 1 sets - 3 reps - 30 hold - Bridge  - 1 x daily - 7 x weekly - 1 sets - 10 reps - Clamshell  - 1 x daily - 7 x weekly - 1 sets - 10 reps - Sit to Stand  - 1 x daily - 7 x weekly - 1 sets - 10 reps   GOALS: Goals reviewed with patient? Yes  SHORT TERM GOALS: Target date: 02/15/2022  The patient will demonstrate knowledge of basic self care strategies and exercises to promote healing   Baseline: Goal status: INITIAL  2.  The patient will have improved right hip pain and strength to at least 4/5 needed for standing, walking longer distances and descending stairs at home and in the community   Baseline:  Goal status: INITIAL  3.  The patient will have an improved BERG balance score to   45 /56 indicating reduced risk of falls  Baseline:  Goal status: INITIAL  4.  Dynamic Gait Index 14/24 Baseline:  Goal status: INITIAL   LONG TERM GOALS: Target date: 03/15/2022  The patient will be independent in a safe self progression of a home exercise program to promote further recovery of function   Baseline:  Goal status: INITIAL  2.  The patient will have improved right hip pain and strength to at least 4+/5 needed for standing, walking longer distances and descending stairs at home and in the community  Baseline:   Goal status: INITIAL  3.  Dynamic Gait Index improved to 18/24 indicating improved safety and decreased risk for falls Baseline:  Goal status: INITIAL  4.  The patient will have an improved BERG balance score to   48 /56 indicating reduced risk of falls  Baseline:  Goal status: INITIAL  5.  5x sit to stand improved to 12.5 sec without right hip pain Baseline:  Goal status: INITIAL   ASSESSMENT:  CLINICAL IMPRESSION: The patient is highly challenged by narrow base of support, single leg standing, turning and reaching tasks.  She compensates by holding her arms out to the side and widening her base of support.  She requires mod/max assist from therapist using a gait belt 7-8x in order to avoid a fall.        OBJECTIVE IMPAIRMENTS decreased balance, decreased strength, and impaired perceived functional ability.   ACTIVITY LIMITATIONS carrying, lifting, bending, standing, squatting, stairs, and locomotion level  PARTICIPATION LIMITATIONS: meal prep, cleaning, laundry, shopping, community activity, and church  PERSONAL FACTORS Past/current experiences, Time since onset of injury/illness/exacerbation, and 1-2 comorbidities: osteoporosis, autonomic dysfunction, multi regions/areas affected  are also affecting patient's functional outcome.   REHAB POTENTIAL: Good  CLINICAL DECISION MAKING: moderate EVALUATION COMPLEXITY: moderate PLAN: PT FREQUENCY: 2x/week  PT DURATION: 8 weeks  PLANNED INTERVENTIONS: Therapeutic exercises, Therapeutic activity, Neuromuscular re-education, Balance training, Gait training, Patient/Family education, Self Care, Joint mobilization, Aquatic Therapy, Dry Needling, Electrical stimulation, Cryotherapy, Moist heat, Taping, Ultrasound, Ionotophoresis '4mg'$ /ml Dexamethasone, Manual therapy, and Re-evaluation  PLAN FOR NEXT SESSION: monitor BP secondary autonomic dysfunction;  DN and manual therapy to right glutes, glute strengthening;  calf stretch;  balance  narrow base of support, head and body turns; gait belt for safety  Ruben Im,  PT 02/02/22 11:29 AM Phone: [phone] Fax: [phone]

## 2022-02-02 NOTE — Progress Notes (Signed)
HPI Danielle Garrett returns today for followup. She is a pleasant 71 yo woman with a h/o HTN, and long standing neurally mediated syncope. I saw her back in June. She worn a cardiac monitor which did not show any pauses or rapid heart beats. She has not passed out since then but she notes episodes of palpitations. Her Apple watch shows heart rates in the160's but we do not have any ECG's. She does not know how to set up this option in her Apple watch.  Allergies  Allergen Reactions   Hydrochlorothiazide Nausea Only   Penicillins Other (See Comments)    Unknown Other reaction(s): Other (See Comments) Pt was a child   Adhesive [Tape]     Pt states ' it took my skin off' Steri-Strips   Atorvastatin Calcium     Other reaction(s): insomnia   Codeine Nausea And Vomiting   Sulfa Antibiotics Nausea And Vomiting   Vicodin [Hydrocodone-Acetaminophen] Nausea And Vomiting and Other (See Comments)    Did not feel like herself     Current Outpatient Medications  Medication Sig Dispense Refill   albuterol (PROVENTIL HFA;VENTOLIN HFA) 108 (90 Base) MCG/ACT inhaler Inhale 1-2 puffs into the lungs every 4 (four) hours as needed for wheezing or shortness of breath.     amLODipine (NORVASC) 5 MG tablet Take 0.5 mg by mouth daily.     aspirin EC 81 MG tablet Take 81 mg by mouth daily.     buPROPion (WELLBUTRIN XL) 150 MG 24 hr tablet Take 1 tablet by mouth daily.     fludrocortisone (FLORINEF) 0.1 MG tablet Take one tablet at 7:00 am and one tablet at 12:00 pm by mouth. 60 tablet 11   FLUoxetine (PROZAC) 10 MG capsule Take 1 capsule by mouth daily.     fluticasone (FLONASE) 50 MCG/ACT nasal spray Place 2 sprays into both nostrils as needed for allergies or rhinitis.     loratadine (CLARITIN) 10 MG tablet Take 10 mg by mouth daily.     losartan (COZAAR) 100 MG tablet Take 100 mg by mouth daily.     Multiple Vitamins-Minerals (CENTRUM SILVER 50+WOMEN PO) Take 1 tablet by mouth daily.     pantoprazole  (PROTONIX) 40 MG tablet Take 40 mg by mouth 2 (two) times daily.     rosuvastatin (CRESTOR) 10 MG tablet Take 10 mg by mouth 3 (three) times a week.     sucralfate (CARAFATE) 1 g tablet Take 1 g by mouth 2 (two) times daily as needed (Takes if Pantoprazole is not working).      VITAMIN D PO Take 1 tablet by mouth daily.     No current facility-administered medications for this visit.     Past Medical History:  Diagnosis Date   Allergic rhinitis due to other allergen    Anginal pain ([place]) 2011   hospitalized for CP in the past; due to low potassium   Anxiety    Arthritis    Childhood asthma    Chronic hoarseness    Gastroparesis    GERD (gastroesophageal reflux disease)    History of hiatal hernia    Hx of blood transfusion reaction    in the early 80s   Hypertension    Left bundle branch block     (abnormal heart rhythm)   PONV (postoperative nausea and vomiting)    Pyloric stenosis     ROS:   All systems reviewed and negative except as noted in the HPI.  Past Surgical History:  Procedure Laterality Date   ABDOMINAL HYSTERECTOMY  1983   CARDIAC CATHETERIZATION  10/2009   Cardiac cath normal coronary arteries   CERVICAL DISC ARTHROPLASTY     disc replacement in neck 1998   COLONOSCOPY     10 year repeat 09/26/2010   ESOPHAGOGASTRODUODENOSCOPY  09/26/2010,01/08/13   LOOP RECORDER INSERTION N/A 05/29/2016   Procedure: Loop Recorder Insertion;  Surgeon: Evans Lance, MD;  Location: [facility];  Service: Cardiovascular;  Laterality: N/A;   OTHER SURGICAL HISTORY  1983   hysterectomy ovaries intact, prolapsed    OTHER SURGICAL HISTORY  2006   pyloric stricture surgery   TOTAL HIP ARTHROPLASTY Right 06/07/2015   Procedure: TOTAL HIP ARTHROPLASTY ANTERIOR APPROACH;  Surgeon: Frederik Pear, MD;  Location: [facility];  Service: Orthopedics;  Laterality: Right;     Family History  Problem Relation Age of Onset   Hypertension Mother    Cirrhosis Father    Hypertension  Father    OCD Sister    Depression Sister    Depression Sister    Hypertension Sister      Social History   Socioeconomic History   Marital status: Married    Spouse name: Not on file   Number of children: Not on file   Years of education: Not on file   Highest education level: Not on file  Occupational History   Not on file  Tobacco Use   Smoking status: Never   Smokeless tobacco: Never  Vaping Use   Vaping Use: Never used  Substance and Sexual Activity   Alcohol use: No    Alcohol/week: 0.0 standard drinks of alcohol   Drug use: No   Sexual activity: Not on file  Other Topics Concern   Not on file  Social History Narrative   ** Merged History Encounter **    Right handed    Social Determinants of Health   Financial Resource Strain: Not on file  Food Insecurity: Not on file  Transportation Needs: Not on file  Physical Activity: Not on file  Stress: Not on file  Social Connections: Not on file  Intimate Partner Violence: Not on file     BP (!) 146/78   Pulse 82   Ht 5' (1.524 m)   Wt 116 lb 3.2 oz (52.7 kg)   SpO2 97%   BMI 22.69 kg/m   Physical Exam:  Well appearing NAD HEENT: Unremarkable Neck:  No JVD, no thyromegally Lymphatics:  No adenopathy Back:  No CVA tenderness Lungs:  Clear HEART:  Regular rate rhythm, no murmurs, no rubs, no clicks Abd:  soft, positive bowel sounds, no organomegally, no rebound, no guarding Ext:  2 plus pulses, no edema, no cyanosis, no clubbing Skin:  No rashes no nodules Neuro:  CN II through XII intact, motor grossly intact  EKG - NSR with a short PR and LBBB  Assess/Plan:  Syncope - she has not had any additional episodes but she has had an elevated HR at times. I asked her to find out how to set up her watch so that it will record her ECG from her watch and on to her phone.  HTN - her bp is controlled and we will follow.  Danielle Overlie Alexande Sheerin,MD

## 2022-02-06 ENCOUNTER — Telehealth: Payer: Self-pay | Admitting: Internal Medicine

## 2022-02-06 NOTE — Telephone Encounter (Signed)
Patient states that she was told to attach her smart watch to our practice to keep track HR and other info, but it is not connecting. Requesting call back.

## 2022-02-06 NOTE — Telephone Encounter (Signed)
Spoke with pt and attempted to assist pt with sending ECG through MyChart from apple watch.  Pt unable to send file due to size.  Pt provided [id number] to contact to assist with sending file.  Pt thanked Therapist, sports for the assistance.

## 2022-02-07 ENCOUNTER — Encounter: Payer: Self-pay | Admitting: Internal Medicine

## 2022-02-07 ENCOUNTER — Ambulatory Visit: Payer: Medicare Other | Admitting: Physical Therapy

## 2022-02-07 DIAGNOSIS — R072 Precordial pain: Secondary | ICD-10-CM | POA: Diagnosis not present

## 2022-02-07 DIAGNOSIS — R2689 Other abnormalities of gait and mobility: Secondary | ICD-10-CM | POA: Diagnosis not present

## 2022-02-07 DIAGNOSIS — M25672 Stiffness of left ankle, not elsewhere classified: Secondary | ICD-10-CM

## 2022-02-07 DIAGNOSIS — M25551 Pain in right hip: Secondary | ICD-10-CM

## 2022-02-07 DIAGNOSIS — M6281 Muscle weakness (generalized): Secondary | ICD-10-CM

## 2022-02-07 DIAGNOSIS — Z9181 History of falling: Secondary | ICD-10-CM

## 2022-02-07 NOTE — Therapy (Signed)
OUTPATIENT PHYSICAL THERAPY PROGRESS NOTE   Patient Name: Danielle Garrett MRN: [id number] DOB:1951/04/14, 71 y.o., female Today's Date: 02/07/2022   PCP: London Pepper MD REFERRING PROVIDER: London Pepper MD   PT End of Session - 02/07/22 0932     Visit Number 6    Date for PT Re-Evaluation 03/15/22    Authorization Type Medicare    PT Start Time 0932    PT Stop Time 3354    PT Time Calculation (min) 42 min    Activity Tolerance Patient tolerated treatment well             Past Medical History:  Diagnosis Date   Allergic rhinitis due to other allergen    Anginal pain ([place]) 2011   hospitalized for CP in the past; due to low potassium   Anxiety    Arthritis    Childhood asthma    Chronic hoarseness    Gastroparesis    GERD (gastroesophageal reflux disease)    History of hiatal hernia    Hx of blood transfusion reaction    in the early 80s   Hypertension    Left bundle branch block     (abnormal heart rhythm)   PONV (postoperative nausea and vomiting)    Pyloric stenosis    Past Surgical History:  Procedure Laterality Date   [place]  10/2009   Cardiac cath normal coronary arteries   CERVICAL DISC ARTHROPLASTY     disc replacement in neck 1998   COLONOSCOPY     10 year repeat 09/26/2010   ESOPHAGOGASTRODUODENOSCOPY  09/26/2010,01/08/13   LOOP RECORDER INSERTION N/A 05/29/2016   Procedure: Loop Recorder Insertion;  Surgeon: Evans Lance, MD;  Location: [facility];  Service: Cardiovascular;  Laterality: N/A;   OTHER SURGICAL HISTORY  1983   hysterectomy ovaries intact, prolapsed    OTHER SURGICAL HISTORY  2006   pyloric stricture surgery   TOTAL HIP ARTHROPLASTY Right 06/07/2015   Procedure: TOTAL HIP ARTHROPLASTY ANTERIOR APPROACH;  Surgeon: Frederik Pear, MD;  Location: [facility];  Service: Orthopedics;  Laterality: Right;   Patient Active Problem List   Diagnosis Date Noted   Autonomic dysfunction  09/28/2021   Atrial fibrillation with tachycardic ventricular rate ([place]) 05/29/2016   Syncope, cardiogenic 05/24/2016   Primary osteoarthritis of right hip 06/05/2015   Pre-operative clearance 06/02/2015   Chest pain 06/02/2015   LBBB (left bundle branch block) 06/02/2015   History of left hip replacement 06/02/2015   Palpitation 10/07/2014   Constipation 08/23/2013   Nausea 08/23/2013    ONSET DATE: 04/17/21 REFERRING DIAG: R26.89 Loss of balance  THERAPY DIAG:  Decreased balance; weakness; risk of falls; right hip pain; left ankle stiffness   Rationale for Evaluation and Treatment Rehabilitation  SUBJECTIVE:  SUBJECTIVE STATEMENT:   I think my balance is a little bit better.  I went to the [facility], downhill with slant to right and left was hard to manage, used the railings.  Some swelling and pain in top of the foot but it was manageable and gone the next day.     BP: 160/97   PERTINENT HISTORY:  right THR 4-5 years ago "didn't heal correctly";   ankle fracture  plates/screws in Jan Dr. Lucia Gaskins follows and says one spot not fully healed (proposed future surgery) had 1 injection which helped pain;  hearing loss; HTN ( taken off 1 med, cardiologist wants BP a little elevated)  Started on [place] yesterday for osteoporosis  Goes by [place]: MONITOR BLOOD PRESSURE; SUPINE WITH LEGS ELEVATED  PAIN:  Are you having pain? no NPRS scale: 0/10 Pain location:low back;  posterior hip and thigh pain with standing prolonged    Aggravating factors: sitting Relieving factors: at rest    PRECAUTIONS: Fall  WEIGHT BEARING RESTRICTIONS No  FALLS: Has patient fallen in last 6 months? No  LIVING ENVIRONMENT: Lives with: lives with their  spouse Lives in: House/apartment Stairs: No PLOF: Independent  PATIENT GOALS less pain; balance "people think I'm drunk sometimes"   OBJECTIVE:  DIAGNOSTIC FINDINGS: hip x-ray after surgery  COGNITION: Overall cognitive status: Within functional limits for tasks assessed   MUSCLE LENGTH: Decreased hip flexor lengths right > left  POSTURE:  left calf muscle atrophy  LOWER EXTREMITY ROM:   decreased right hip external rotation; decreased left ankle dorsiflexion to 4 degrees  LOWER EXTREMITY MMT:    MMT Right Eval Left Eval  Hip flexion 4 4+  Hip extension 4- 4+  Hip abduction 4- 4+  Hip adduction    Hip internal rotation    Hip external rotation    Knee flexion 4+ 4+  Knee extension 4 4  Ankle dorsiflexion 5 3+  Ankle plantarflexion 5 3+  Ankle inversion 5 3+  Ankle eversion 5 3+  (Blank rows = not tested)    GAIT: Comments: no device used  FUNCTIONAL TESTs:   5 times sit to stand: 14 Right hip pain Berg Balance Scale: 42/56 Dynamic Gait Index: 11/24 Holds arms out to the side to balance with head turns, faster speeds, around obstacles SLS; left 3 sec very wobbly left ankle pain; right 5 sec also very wobbly  TODAY'S TREATMENT:  10/24: Seated 5# kbell up and over cone Sit to stand holding 5# kbell 5x Chair sit ups holding 5# kbell 10x Seated dead lifts blue band 10x Manual perturbances at hips and shoulders 4 directions Sit to stand eyes close with UE support 5x Head turns with narrow base of support Standing on foam with head turns and reaching, weight shifting Turns 1/4,1/2 and full turns, UE reaching, head turns with dual task (cognitive) Neuromuscular re-education: all performed with gait belt on, muscle activation with verbal and tactile cues to improve stability and reduce loss of balance      10/19: Seated rocker board 1 min Seated 5# kbell up and over cone Seated 5# kbell heel raise Sit to stand holding 5# kbell 5x Chair sit ups holding  5# kbell 10x Neuromuscular re-education: all performed with gait belt on, muscle activation with verbal and tactile cues to improve stability and reduce loss of balance Head turns with narrow base of support Backwards walk next to railing Sidestepping at the railing Balloon taps to wall  3 D lunges facing wall Wall pull aways using anterior tib 20x Turns 1/4,1/2 and full turns, UE reaching, head turns      10/18:  Neuromuscular re-education: muscle activation with verbal and tactile cues to improve stability and reduce loss of balance Staggered stance left foot forward dec balance WB on left with right foot on 1st step with dec balance SLS on right with ease Feet close together with number reaches (initially difficult but improves) Turning 1/4 turns with reaching multi plane, 1/2 turns with reaching Red band seated ankle band ex's small ROM DF, inversion,eversion 10x each; discussed toe scrunches for instrinsics 2nd step calf and hip flexor stretch with UE raises 5x right/left     PATIENT EDUCATION: Education details: plan of care  Person educated: Patient Education method: Explanation Education comprehension: verbalized understanding   HOME EXERCISE PROGRAM: Access Code: [email or web address] URL: [email or web address] Date: 02/02/2022 Prepared by: Ruben Im  Exercises - Hip Flexor Stretch at Edge of Bed (Mirrored)  - 1 x daily - 7 x weekly - 1 sets - 3 reps - 30 hold - Bridge  - 1 x daily - 7 x weekly - 1 sets - 10 reps - Clamshell  - 1 x daily - 7 x weekly - 1 sets - 10 reps - Sit to Stand  - 1 x daily - 7 x weekly - 1 sets - 10 reps   GOALS: Goals reviewed with patient? Yes  SHORT TERM GOALS: Target date: 02/15/2022  The patient will demonstrate knowledge of basic self care strategies and exercises to promote healing   Baseline: Goal status: INITIAL  2.  The patient will have improved right hip pain and strength to at least 4/5 needed for standing,  walking longer distances and descending stairs at home and in the community   Baseline:  Goal status: INITIAL  3.  The patient will have an improved BERG balance score to   45 /56 indicating reduced risk of falls  Baseline:  Goal status: INITIAL  4.  Dynamic Gait Index 14/24 Baseline:  Goal status: INITIAL   LONG TERM GOALS: Target date: 03/15/2022  The patient will be independent in a safe self progression of a home exercise program to promote further recovery of function   Baseline:  Goal status: INITIAL  2.  The patient will have improved right hip pain and strength to at least 4+/5 needed for standing, walking longer distances and descending stairs at home and in the community  Baseline:  Goal status: INITIAL  3.  Dynamic Gait Index improved to 18/24 indicating improved safety and decreased risk for falls Baseline:  Goal status: INITIAL  4.  The patient will have an improved BERG balance score to   48 /56 indicating reduced risk of falls  Baseline:  Goal status: INITIAL  5.  5x sit to stand improved to 12.5 sec without right hip pain Baseline:  Goal status: INITIAL   ASSESSMENT:  CLINICAL IMPRESSION: Required mod/max assist to recover balance with weight shifting on foam/compliant surface and with manual perturbances especially anterior and posterior directions.  Sit to stands also very challenging with eyes closed.  Ankle discomfort reported during session and following but tolerable in intensity per patient report.        OBJECTIVE IMPAIRMENTS decreased balance, decreased strength, and impaired perceived functional ability.   ACTIVITY LIMITATIONS carrying, lifting, bending, standing, squatting, stairs, and locomotion level  PARTICIPATION LIMITATIONS: meal prep, cleaning, laundry, shopping, community activity, and church  PERSONAL FACTORS Past/current  experiences, Time since onset of injury/illness/exacerbation, and 1-2 comorbidities: osteoporosis, autonomic  dysfunction, multi regions/areas affected  are also affecting patient's functional outcome.   REHAB POTENTIAL: Good  CLINICAL DECISION MAKING: moderate EVALUATION COMPLEXITY: moderate PLAN: PT FREQUENCY: 2x/week  PT DURATION: 8 weeks  PLANNED INTERVENTIONS: Therapeutic exercises, Therapeutic activity, Neuromuscular re-education, Balance training, Gait training, Patient/Family education, Self Care, Joint mobilization, Aquatic Therapy, Dry Needling, Electrical stimulation, Cryotherapy, Moist heat, Taping, Ultrasound, Ionotophoresis '4mg'$ /ml Dexamethasone, Manual therapy, and Re-evaluation  PLAN FOR NEXT SESSION: monitor BP secondary autonomic dysfunction; recheck BERG and DGI next week; balance narrow base of support, head and body turns; gait belt for safety  Ruben Im, PT 02/07/22 9:33 AM Phone: [phone] Fax: [phone]

## 2022-02-09 ENCOUNTER — Ambulatory Visit: Payer: Medicare Other | Admitting: Physical Therapy

## 2022-02-09 DIAGNOSIS — M25551 Pain in right hip: Secondary | ICD-10-CM

## 2022-02-09 DIAGNOSIS — M25672 Stiffness of left ankle, not elsewhere classified: Secondary | ICD-10-CM

## 2022-02-09 DIAGNOSIS — M6281 Muscle weakness (generalized): Secondary | ICD-10-CM

## 2022-02-09 DIAGNOSIS — R072 Precordial pain: Secondary | ICD-10-CM | POA: Diagnosis not present

## 2022-02-09 DIAGNOSIS — R2689 Other abnormalities of gait and mobility: Secondary | ICD-10-CM | POA: Diagnosis not present

## 2022-02-09 DIAGNOSIS — Z9181 History of falling: Secondary | ICD-10-CM

## 2022-02-09 NOTE — Therapy (Signed)
OUTPATIENT PHYSICAL THERAPY PROGRESS NOTE   Patient Name: Danielle Garrett MRN: [id number] DOB:Jul 26, 1950, 71 y.o., female Today's Date: 02/09/2022   PCP: London Pepper MD REFERRING PROVIDER: London Pepper MD   PT End of Session - 02/09/22 0929     Visit Number 7    Date for PT Re-Evaluation 03/15/22    Authorization Type Medicare    PT Start Time 0930    PT Stop Time 1013    PT Time Calculation (min) 43 min    Activity Tolerance Patient tolerated treatment well             Past Medical History:  Diagnosis Date   Allergic rhinitis due to other allergen    Anginal pain ([place]) 2011   hospitalized for CP in the past; due to low potassium   Anxiety    Arthritis    Childhood asthma    Chronic hoarseness    Gastroparesis    GERD (gastroesophageal reflux disease)    History of hiatal hernia    Hx of blood transfusion reaction    in the early 80s   Hypertension    Left bundle branch block     (abnormal heart rhythm)   PONV (postoperative nausea and vomiting)    Pyloric stenosis    Past Surgical History:  Procedure Laterality Date   [place]  10/2009   Cardiac cath normal coronary arteries   CERVICAL DISC ARTHROPLASTY     disc replacement in neck 1998   COLONOSCOPY     10 year repeat 09/26/2010   ESOPHAGOGASTRODUODENOSCOPY  09/26/2010,01/08/13   LOOP RECORDER INSERTION N/A 05/29/2016   Procedure: Loop Recorder Insertion;  Surgeon: Evans Lance, MD;  Location: [facility];  Service: Cardiovascular;  Laterality: N/A;   OTHER SURGICAL HISTORY  1983   hysterectomy ovaries intact, prolapsed    OTHER SURGICAL HISTORY  2006   pyloric stricture surgery   TOTAL HIP ARTHROPLASTY Right 06/07/2015   Procedure: TOTAL HIP ARTHROPLASTY ANTERIOR APPROACH;  Surgeon: Frederik Pear, MD;  Location: [facility];  Service: Orthopedics;  Laterality: Right;   Patient Active Problem List   Diagnosis Date Noted   Autonomic dysfunction  09/28/2021   Atrial fibrillation with tachycardic ventricular rate ([place]) 05/29/2016   Syncope, cardiogenic 05/24/2016   Primary osteoarthritis of right hip 06/05/2015   Pre-operative clearance 06/02/2015   Chest pain 06/02/2015   LBBB (left bundle branch block) 06/02/2015   History of left hip replacement 06/02/2015   Palpitation 10/07/2014   Constipation 08/23/2013   Nausea 08/23/2013    ONSET DATE: 04/17/21 REFERRING DIAG: R26.89 Loss of balance  THERAPY DIAG:  Decreased balance; weakness; risk of falls; right hip pain; left ankle stiffness   Rationale for Evaluation and Treatment Rehabilitation  SUBJECTIVE:  SUBJECTIVE STATEMENT:   Discomfort in right posterior hip and top of left foot,  Yesterday was busy all day with walking and steps.    BP: 146/92  (I took my BP med last evening instead of evening)   PERTINENT HISTORY:  right THR 4-5 years ago "didn't heal correctly";   ankle fracture  plates/screws in Jan Dr. Lucia Gaskins follows and says one spot not fully healed (proposed future surgery) had 1 injection which helped pain;  hearing loss; HTN ( taken off 1 med, cardiologist wants BP a little elevated)  Started on [place] yesterday for osteoporosis  Goes by [place]: MONITOR BLOOD PRESSURE; SUPINE WITH LEGS ELEVATED  PAIN:  Are you having pain? yes NPRS scale: 2/10 Pain location:low back;  posterior hip and thigh pain with standing prolonged    Aggravating factors: sitting Relieving factors: at rest    PRECAUTIONS: Fall  WEIGHT BEARING RESTRICTIONS No  FALLS: Has patient fallen in last 6 months? No  LIVING ENVIRONMENT: Lives with: lives with their spouse Lives in: House/apartment Stairs: No PLOF: Independent  PATIENT GOALS less  pain; balance "people think I'm drunk sometimes"   OBJECTIVE:  DIAGNOSTIC FINDINGS: hip x-ray after surgery  COGNITION: Overall cognitive status: Within functional limits for tasks assessed   MUSCLE LENGTH: Decreased hip flexor lengths right > left  POSTURE:  left calf muscle atrophy  LOWER EXTREMITY ROM:   decreased right hip external rotation; decreased left ankle dorsiflexion to 4 degrees  LOWER EXTREMITY MMT:    MMT Right Eval Left Eval 10/26  Hip flexion 4 4+ 4   4+  Hip extension 4- 4+ 4    4+  Hip abduction 4- 4+ 4   4+  Hip adduction     Hip internal rotation     Hip external rotation     Knee flexion 4+ 4+ 4+   4+  Knee extension 4 4 4   4   Ankle dorsiflexion 5 3+ 5   3+  Ankle plantarflexion 5 3+ 5   3+  Ankle inversion 5 3+   Ankle eversion 5 3+   (Blank rows = not tested)    GAIT: Comments: no device used  FUNCTIONAL TESTs:   5 times sit to stand: 14 Right hip pain Berg Balance Scale: 42/56 Dynamic Gait Index: 11/24 Holds arms out to the side to balance with head turns, faster speeds, around obstacles SLS; left 3 sec very wobbly left ankle pain; right 5 sec also very wobbly  TODAY'S TREATMENT:  10/26: Walk hallway head turns Walk 25% faster in hallway;  faster walk with arm swing;  faster walk with hands in pockets  (hands in pockets difficult) Resisted walk 4 ways 5#  max assist with forward and left side stepping; CGA only with backward and right side stepping [employer] pew sways in front of chair working on control in retro direction Side to side weight shift with narrow base of support Narrow base of support standing with decreasing UE support  Neuromuscular re-education: all performed with gait belt on, muscle activation with verbal and tactile cues to improve stability and reduce loss of balance       10/24: Seated 5# kbell up and over cone Sit to stand holding 5# kbell 5x Chair sit ups holding 5# kbell 10x Seated dead lifts blue band  10x Manual perturbances at hips and shoulders 4 directions Sit to stand eyes close with UE support 5x Head turns with narrow base  of support Standing on foam with head turns and reaching, weight shifting Turns 1/4,1/2 and full turns, UE reaching, head turns with dual task (cognitive) Neuromuscular re-education: all performed with gait belt on, muscle activation with verbal and tactile cues to improve stability and reduce loss of balance      10/19: Seated rocker board 1 min Seated 5# kbell up and over cone Seated 5# kbell heel raise Sit to stand holding 5# kbell 5x Chair sit ups holding 5# kbell 10x Neuromuscular re-education: all performed with gait belt on, muscle activation with verbal and tactile cues to improve stability and reduce loss of balance Head turns with narrow base of support Backwards walk next to railing Sidestepping at the railing Balloon taps to wall 3 D lunges facing wall Wall pull aways using anterior tib 20x Turns 1/4,1/2 and full turns, UE reaching, head turns     PATIENT EDUCATION: Education details: plan of care  Person educated: Patient Education method: Explanation Education comprehension: verbalized understanding   HOME EXERCISE PROGRAM: Access Code: [email or web address] URL: [email or web address] Date: 02/02/2022 Prepared by: Ruben Im  Exercises - Hip Flexor Stretch at Edge of Bed (Mirrored)  - 1 x daily - 7 x weekly - 1 sets - 3 reps - 30 hold - Bridge  - 1 x daily - 7 x weekly - 1 sets - 10 reps - Clamshell  - 1 x daily - 7 x weekly - 1 sets - 10 reps - Sit to Stand  - 1 x daily - 7 x weekly - 1 sets - 10 reps   GOALS: Goals reviewed with patient? Yes  SHORT TERM GOALS: Target date: 02/15/2022  The patient will demonstrate knowledge of basic self care strategies and exercises to promote healing   Baseline: Goal status: ongoing  2.  The patient will have improved right hip pain and strength to at least 4/5 needed for  standing, walking longer distances and descending stairs at home and in the community   Baseline:  Goal status: met 10/26 3.  The patient will have an improved BERG balance score to   45 /56 indicating reduced risk of falls  Baseline:  Goal status: ongoing  4.  Dynamic Gait Index 14/24 Baseline:  Goal status: ongoing   LONG TERM GOALS: Target date: 03/15/2022  The patient will be independent in a safe self progression of a home exercise program to promote further recovery of function   Baseline:  Goal status: INITIAL  2.  The patient will have improved right hip pain and strength to at least 4+/5 needed for standing, walking longer distances and descending stairs at home and in the community  Baseline:  Goal status: INITIAL  3.  Dynamic Gait Index improved to 18/24 indicating improved safety and decreased risk for falls Baseline:  Goal status: INITIAL  4.  The patient will have an improved BERG balance score to   48 /56 indicating reduced risk of falls  Baseline:  Goal status: INITIAL  5.  5x sit to stand improved to 12.5 sec without right hip pain Baseline:  Goal status: INITIAL   ASSESSMENT:  CLINICAL IMPRESSION: Therapist providing max assist at times for loss of balance particularly backwards direction.  Patient uses wide base of support, arms out to the sides and high visual reliance for compensatory strategies.  Patient demonstrates improving LE hip strength and able to rise from the chair without UE assist with ease.    OBJECTIVE IMPAIRMENTS decreased balance, decreased strength, and impaired perceived  functional ability.   ACTIVITY LIMITATIONS carrying, lifting, bending, standing, squatting, stairs, and locomotion level  PARTICIPATION LIMITATIONS: meal prep, cleaning, laundry, shopping, community activity, and church  PERSONAL FACTORS Past/current experiences, Time since onset of injury/illness/exacerbation, and 1-2 comorbidities: osteoporosis, autonomic  dysfunction, multi regions/areas affected  are also affecting patient's functional outcome.   REHAB POTENTIAL: Good  CLINICAL DECISION MAKING: moderate EVALUATION COMPLEXITY: moderate PLAN: PT FREQUENCY: 2x/week  PT DURATION: 8 weeks  PLANNED INTERVENTIONS: Therapeutic exercises, Therapeutic activity, Neuromuscular re-education, Balance training, Gait training, Patient/Family education, Self Care, Joint mobilization, Aquatic Therapy, Dry Needling, Electrical stimulation, Cryotherapy, Moist heat, Taping, Ultrasound, Ionotophoresis 36m/ml Dexamethasone, Manual therapy, and Re-evaluation  PLAN FOR NEXT SESSION: monitor BP secondary autonomic dysfunction; recheck BERG and DGI next week; balance narrow base of support, head and body turns; gait belt for safety  SRuben Im PT 02/09/22 2:44 PM Phone: [phone]: [phone]

## 2022-02-13 ENCOUNTER — Encounter: Payer: Self-pay | Admitting: Internal Medicine

## 2022-02-13 NOTE — Telephone Encounter (Signed)
Patient sent in My Chart message with an EKG that shows a HR of 140 and reports feeling lightheaded.    Called patient, she reports her heart rate is now 118 and she feels a little better. Advised her to repeat her EKG while we were talking and HR was 133. She also stated her BP has been running 160/90s since stopping one of her blood pressure medications.   Patient has appointment for 02/14/22 at 14:45 with Christen Bame, NP.   Advised patient to go to the ED or call EMS if she develops worsening symptoms such as increased palpitations, lightheadedness, chest pain or SOB. Patient verbalized understanding.

## 2022-02-14 ENCOUNTER — Ambulatory Visit: Payer: Medicare Other | Admitting: Physical Therapy

## 2022-02-14 ENCOUNTER — Ambulatory Visit: Payer: Medicare Other | Attending: Nurse Practitioner | Admitting: Nurse Practitioner

## 2022-02-14 ENCOUNTER — Encounter: Payer: Self-pay | Admitting: Nurse Practitioner

## 2022-02-14 VITALS — BP 118/76 | HR 82 | Ht 60.0 in | Wt 116.0 lb

## 2022-02-14 DIAGNOSIS — R0989 Other specified symptoms and signs involving the circulatory and respiratory systems: Secondary | ICD-10-CM | POA: Insufficient documentation

## 2022-02-14 DIAGNOSIS — I447 Left bundle-branch block, unspecified: Secondary | ICD-10-CM | POA: Insufficient documentation

## 2022-02-14 DIAGNOSIS — I10 Essential (primary) hypertension: Secondary | ICD-10-CM | POA: Insufficient documentation

## 2022-02-14 DIAGNOSIS — R072 Precordial pain: Secondary | ICD-10-CM | POA: Diagnosis not present

## 2022-02-14 DIAGNOSIS — M25551 Pain in right hip: Secondary | ICD-10-CM

## 2022-02-14 DIAGNOSIS — R55 Syncope and collapse: Secondary | ICD-10-CM | POA: Diagnosis not present

## 2022-02-14 DIAGNOSIS — G909 Disorder of the autonomic nervous system, unspecified: Secondary | ICD-10-CM | POA: Insufficient documentation

## 2022-02-14 DIAGNOSIS — M6281 Muscle weakness (generalized): Secondary | ICD-10-CM

## 2022-02-14 DIAGNOSIS — M25672 Stiffness of left ankle, not elsewhere classified: Secondary | ICD-10-CM

## 2022-02-14 DIAGNOSIS — I495 Sick sinus syndrome: Secondary | ICD-10-CM | POA: Insufficient documentation

## 2022-02-14 DIAGNOSIS — R2689 Other abnormalities of gait and mobility: Secondary | ICD-10-CM | POA: Diagnosis not present

## 2022-02-14 DIAGNOSIS — Z9181 History of falling: Secondary | ICD-10-CM

## 2022-02-14 MED ORDER — METOPROLOL TARTRATE 50 MG PO TABS: ORAL_TABLET | ORAL | 0 refills | Status: DC

## 2022-02-14 NOTE — Progress Notes (Signed)
Cardiology Office Note:    Date:  02/14/2022   ID:  Danielle Garrett, Danielle Garrett 1950/06/18, MRN [id number]  PCP:  London Pepper, MD   [facility] Providers Cardiologist:  None     Referring MD: London Pepper, MD   Chief Complaint: syncope  History of Present Illness:    Danielle Garrett is a very pleasant 71 y.o. female with a hx of HTN, neurally mediated syncope, palpitations, and LBBB. She had a loop recorder placed 2019 and removed in 2020.  Greater than 20-year history of near syncope associated with prodrome of nausea, weakness, diaphoresis.  Seen by Dr. Lovena Le and loop recorder implanted in 2019. Referred to Dr. Tamala Julian for general cardiology and seen on 06/20/21. During the active monitoring, no episodes of syncope occurred.  Over the past year or 2 she has began to have episodes of near syncope. These episodes are with prodrome. Over the previous 6 weeks she has had a dramatic increase in episodes.  In January 2023 was in [place] to help her friend recover from knee replacement surgery. On 04/20/2021, the day of her friend surgery, she developed typical prodrome shortly after they announced that her friend had successful surgery.  Due to the circumstances she did not respond as she typically does and tried to continue with normal activities despite feeling nauseated, diaphoretic, weak, and presyncopal.  In the elevator, she collapsed, shattered her ankle, and had to be admitted to the hospital for surgery.  During hospitalization, she was noted to have rapid heart rates on telemetry but no excessive episodes of bradycardia.  Had an additional episode of syncope while sitting at [place] on 05/02/2021.  States that she had a very brief prodrome before she fainted.  Additional episode while sitting on the toilet approximately 1 week prior to office visit, but she did not lose consciousness. Upset by her inability to drive.   Cardiac monitor 07/2021 revealed basic rhythm NSR with BBB  pattern, no except self bradycardia or significant tachycardia, no ventricular tachycardia, overall no findings to explain syncope  Last cardiology clinic visit was 02/02/2022 with Dr. Lovena Le.  She reported symptoms of palpitations.  Her Apple Watch shows heart rates in the 160s but no EKGs have been recorded. No recent syncope.   Today, she is here alone for follow-up. She has had elevated HR readings at home on her Apple watch. Has sent [place] some EKG tracings. HR ranges from 40-167 bpm. Feels lightheaded with elevated HR. When walking for exercise on 10/30,  HR was 118 while walking at a fast pace then when resting HR was 137 bpm. Is very concerned that she is going to pass out again. Has resumed driving. When she becomes symptomatic, she lies down on the floor. Did not feel like the cardiac monitor was effective at monitoring because she could not keep the stickers on her chest. Had same problem with Zio monitor. Sodium and potassium tend to be low, so PCP has advised her to reduce her intake of water. She has one cup of coffee, one 16 oz water bottle, water with meds, and a small gatorade daily. Eats small frequent meals. History of hypoglycemia, however blood sugar was normal at time of syncope at hospitalization and was normal when she had a home monitor. Home BP runs high. BP at PT today was 165/95. Previously told by Drs. Lovena Le and Tamala Julian that we would tolerate hypertension to avoid causing orthostatic hypotension.  She has sharp pain below her left breast  at the sternal border, does not worsen with exertion. Feels a squeezing sensation at times, particularly when she lies on her left side. No associated symptoms with this sensation. No DOE, orthopnea, PND, or palpitations.   Past Medical History:  Diagnosis Date   Allergic rhinitis due to other allergen    Anginal pain ([place]) 2011   hospitalized for CP in the past; due to low potassium   Anxiety    Arthritis    Childhood asthma    Chronic  hoarseness    Gastroparesis    GERD (gastroesophageal reflux disease)    History of hiatal hernia    Hx of blood transfusion reaction    in the early 80s   Hypertension    Left bundle branch block     (abnormal heart rhythm)   PONV (postoperative nausea and vomiting)    Pyloric stenosis     Past Surgical History:  Procedure Laterality Date   [place]  10/2009   Cardiac cath normal coronary arteries   CERVICAL DISC ARTHROPLASTY     disc replacement in neck 1998   COLONOSCOPY     10 year repeat 09/26/2010   ESOPHAGOGASTRODUODENOSCOPY  09/26/2010,01/08/13   LOOP RECORDER INSERTION N/A 05/29/2016   Procedure: Loop Recorder Insertion;  Surgeon: Evans Lance, MD;  Location: [place] [facility];  Service: Cardiovascular;  Laterality: N/A;   OTHER SURGICAL HISTORY  1983   hysterectomy ovaries intact, prolapsed    OTHER SURGICAL HISTORY  2006   pyloric stricture surgery   TOTAL HIP ARTHROPLASTY Right 06/07/2015   Procedure: TOTAL HIP ARTHROPLASTY ANTERIOR APPROACH;  Surgeon: Frederik Pear, MD;  Location: [place];  Service: Orthopedics;  Laterality: Right;    Current Medications: Current Meds  Medication Sig   albuterol (PROVENTIL HFA;VENTOLIN HFA) 108 (90 Base) MCG/ACT inhaler Inhale 1-2 puffs into the lungs every 4 (four) hours as needed for wheezing or shortness of breath.   aspirin EC 81 MG tablet Take 81 mg by mouth daily.   fludrocortisone (FLORINEF) 0.1 MG tablet Take one tablet at 7:00 am and one tablet at 12:00 pm by mouth.   fluticasone (FLONASE) 50 MCG/ACT nasal spray Place 2 sprays into both nostrils as needed for allergies or rhinitis.   loratadine (CLARITIN) 10 MG tablet Take 10 mg by mouth daily.   losartan (COZAAR) 100 MG tablet Take 100 mg by mouth daily.   metoprolol tartrate (LOPRESSOR) 50 MG tablet Take one (1) tablet by mouth ( 50 mg) 2 hours prior to CT scan.   Multiple Vitamins-Minerals (CENTRUM SILVER 50+WOMEN PO) Take 1  tablet by mouth daily.   pantoprazole (PROTONIX) 40 MG tablet Take 40 mg by mouth 2 (two) times daily.   rosuvastatin (CRESTOR) 10 MG tablet Take 10 mg by mouth 3 (three) times a week.   sucralfate (CARAFATE) 1 g tablet Take 1 g by mouth 2 (two) times daily as needed (Takes if Pantoprazole is not working).    VITAMIN D PO Take 1 tablet by mouth daily.     Allergies:   Hydrochlorothiazide, Penicillins, Adhesive [tape], Atorvastatin calcium, Codeine, Sulfa antibiotics, Trimethoprim, and Vicodin [hydrocodone-acetaminophen]   Social History   Socioeconomic History   Marital status: Married    Spouse name: Not on file   Number of children: Not on file   Years of education: Not on file   Highest education level: Not on file  Occupational History   Not on file  Tobacco Use  Smoking status: Never   Smokeless tobacco: Never  Vaping Use   Vaping Use: Never used  Substance and Sexual Activity   Alcohol use: No    Alcohol/week: 0.0 standard drinks of alcohol   Drug use: No   Sexual activity: Not on file  Other Topics Concern   Not on file  Social History Narrative   ** Merged History Encounter **    Right handed    Social Determinants of Health   Financial Resource Strain: Not on file  Food Insecurity: Not on file  Transportation Needs: Not on file  Physical Activity: Not on file  Stress: Not on file  Social Connections: Not on file     Family History: The patient's family history includes Cirrhosis in her father; Depression in her sister and sister; Hypertension in her father, mother, and sister; OCD in her sister.  ROS:   Please see the history of present illness.    + presyncope + chest pain All other systems reviewed and are negative.  Labs/Other Studies Reviewed:    The following studies were reviewed today:  Cardiac monitor 08/05/21  Basic rhythm is NSR with BBB pattern No excessive bradtcardia or significant tachycardia No ventricular tachycardia   Overall,  no findings to explain syncope.  Echo ([facility]) 04/2021  LVEF 55-60%, G1DD Mild MR, moderate TR RVSP 54 mmHg  Recent Labs: No results found for requested labs within last 365 days.  Recent Lipid Panel    Risk Assessment/Calculations:      Physical Exam:    VS:  BP 118/76   Pulse 82   Ht 5' (1.524 m)   Wt 116 lb (52.6 kg)   SpO2 98%   BMI 22.65 kg/m     Wt Readings from Last 3 Encounters:  02/14/22 116 lb (52.6 kg)  02/02/22 116 lb 3.2 oz (52.7 kg)  09/28/21 114 lb 12.8 oz (52.1 kg)     GEN:  Well nourished, well developed in no acute distress HEENT: Normal NECK: No JVD; Left carotid bruit CARDIAC: RRR, no murmurs, rubs, gallops RESPIRATORY:  Clear to auscultation without rales, wheezing or rhonchi  ABDOMEN: Soft, non-tender, non-distended MUSCULOSKELETAL:  No edema; No deformity. 2+ pedal pulses, equal bilaterally SKIN: Warm and dry NEUROLOGIC:  Alert and oriented x 3 PSYCHIATRIC:  Normal affect   EKG:  EKG is not ordered today.    Diagnoses:    1. Precordial pain   2. Syncope and collapse   3. Bruit of left carotid artery   4. Tachycardia-bradycardia syndrome ([place])   5. LBBB (left bundle branch block)   6. Autonomic dysfunction   7. Essential hypertension    Assessment and Plan:     Syncope and collapse: Symptoms of presyncope for many years. She suffered a bad fall 04/2021. Admits that she had prodrome of symptoms and did not lie down as she usually does. Is concerned about future events. Cannot tolerate adhesives for external cardiac monitoring. Consideration for reimplantation of Loop recorder.  We are getting coronary CTA for chest pain and carotid ultrasound for left carotid bruit. If those tests are unremarkable, will discuss further testing options with Dr. Lovena Le and consider reimplant of LINQ.   Tachycardia-bradycardia syndrome: HR is well-controlled presently. Has documentation from [place] watch of rates up to 167. HR  sometimes higher when resting after exercise than during exercise.  Consideration given to starting AV nodal blocker therapy, however she has episodes of daytime HR as low as 40. As  noted above, consider EP referral for consideration of LINQ.   Chest pain: She has pain underneath her left breast at the sternal border. Also has a squeezing sensation at times, most noticeable when lying on her left side. Pain does not worsen with exertion. Somewhat atypical for angina, however she has not had recent ischemia evaluation and with continued symptoms of presyncope, would like to r/o ischemia.  We will get coronary CTA for coronary mapping.  We will have her take Lopressor 50 mg prior to CT.  Left carotid bruit: Left carotid bruit auscultated on exam today.  We will get carotid ultrasound to evaluate for stenosis.  LBBB: Known history of LBBB, seen on EKG today. Normal LV function on echo 04/2021.  Autonomic dysfunction: I do not feel that malnutrition, dehydration, or deconditioning are contributing. Could consider monitoring of cortisol levels in the future. Continue to moderate fluids and electrolytes.   Hypertension: BP is well-controlled today.  She reports it is typically much higher.  Has been advised by Drs. Tawny Hopping to maintain higher BP in order to avoid orthostatic hypotension.    Disposition: 2 months with me  Medication Adjustments/Labs and Tests Ordered: Current medicines are reviewed at length with the patient today.  Concerns regarding medicines are outlined above.  Orders Placed This Encounter  Procedures   CT CORONARY MORPH W/CTA COR W/SCORE W/CA W/CM &/OR WO/CM   Basic Metabolic Panel (BMET)   EKG 12-Lead   VAS US CAROTID   Meds ordered this encounter  Medications   metoprolol tartrate (LOPRESSOR) 50 MG tablet    Sig: Take one (1) tablet by mouth ( 50 mg) 2 hours prior to CT scan.    Dispense:  1 tablet    Refill:  0    Patient Instructions  Medication  Instructions:   Your physician recommends that you continue on your current medications as directed. Please refer to the Current Medication list given to you today.   *If you need a refill on your cardiac medications before your next appointment, please call your pharmacy*   Lab Work:  TODAY!!!! BMET  If you have labs (blood work) drawn today and your tests are completely normal, you will receive your results only by: [place] (if you have MyChart) OR A paper copy in the mail If you have any lab test that is abnormal or we need to change your treatment, we will call you to review the results.   Testing/Procedures:  Your physician has requested that you have a carotid duplex. This test is an ultrasound of the carotid arteries in your neck. It looks at blood flow through these arteries that supply the brain with blood. Allow one hour for this exam. There are no restrictions or special instructions.    Your cardiac CT will be scheduled at one of the below locations:   [facility] [address] [place], [place] [address] [phone]   If scheduled at [facility], please arrive at the [facility] and Children's Entrance (Entrance C2) of [facility] 30 minutes prior to test start time. You can use the FREE valet parking offered at entrance C (encouraged to control the heart rate for the test)  Proceed to the [facility] Radiology Department (first floor) to check-in and test prep.  All radiology patients and guests should use entrance C2 at [facility], accessed from [facility], even though the hospital's physical address listed is [address]  Please follow these instructions carefully (unless otherwise directed):  On the Night Before the Test: Be sure to Drink plenty of water. Do not consume any caffeinated/decaffeinated beverages or chocolate 12 hours prior to your test. Do not take any antihistamines 12  hours prior to your test.  On the Day of the Test: Drink plenty of water until 1 hour prior to the test. Do not eat any food 1 hour prior to test. You may take your regular medications prior to the test.  Take metoprolol (Lopressor) ONE TABLET (1) BY MOUTH ( 50 MG) two hours prior to test. FEMALES- please wear underwire-free bra if available, avoid dresses & tight clothing    After the Test: Drink plenty of water. After receiving IV contrast, you may experience a mild flushed feeling. This is normal. On occasion, you may experience a mild rash up to 24 hours after the test. This is not dangerous. If this occurs, you can take Benadryl 25 mg and increase your fluid intake. If you experience trouble breathing, this can be serious. If it is severe call 911 IMMEDIATELY. If it is mild, please call our office.  We will call to schedule your test 2-4 weeks out understanding that some insurance companies will need an authorization prior to the service being performed.   For non-scheduling related questions, please contact the cardiac imaging nurse navigator should you have any questions/concerns: Marchia Bond, Cardiac Imaging Nurse Navigator Gordy Clement, Cardiac Imaging Nurse Navigator [facility] Direct Office Dial: [phone]   For scheduling needs, including cancellations and rescheduling, please call [place], [phone].    Follow-Up: At [facility], you and your health needs are our priority.  As part of our continuing mission to provide you with exceptional heart care, we have created designated Provider Care Teams.  These Care Teams include your primary Cardiologist (physician) and Advanced Practice Providers (APPs -  Physician Assistants and Nurse Practitioners) who all work together to provide you with the care you need, when you need it.  We recommend signing up for the patient portal called "MyChart".  Sign up information is provided on  this After Visit Summary.  MyChart is used to connect with patients for Virtual Visits (Telemedicine).  Patients are able to view lab/test results, encounter notes, upcoming appointments, etc.  Non-urgent messages can be sent to your provider as well.   To learn more about what you can do with MyChart, go to [email or web address].    Your next appointment:   2 month(s)  The format for your next appointment:   In Person  Provider:   Christen Bame, NP         Important Information About Sugar         Signed, Emmaline Life, NP  02/14/2022 4:49 PM    [place]

## 2022-02-14 NOTE — Patient Instructions (Signed)
Medication Instructions:   Your physician recommends that you continue on your current medications as directed. Please refer to the Current Medication list given to you today.   *If you need a refill on your cardiac medications before your next appointment, please call your pharmacy*   Lab Work:  TODAY!!!! BMET  If you have labs (blood work) drawn today and your tests are completely normal, you will receive your results only by: [place] (if you have MyChart) OR A paper copy in the mail If you have any lab test that is abnormal or we need to change your treatment, we will call you to review the results.   Testing/Procedures:  Your physician has requested that you have a carotid duplex. This test is an ultrasound of the carotid arteries in your neck. It looks at blood flow through these arteries that supply the brain with blood. Allow one hour for this exam. There are no restrictions or special instructions.    Your cardiac CT will be scheduled at one of the below locations:   [facility] [address] [place], [place] [address] [phone]   If scheduled at [facility], please arrive at the [facility] and Children's Entrance (Entrance C2) of [facility] 30 minutes prior to test start time. You can use the FREE valet parking offered at entrance C (encouraged to control the heart rate for the test)  Proceed to the [facility] Radiology Department (first floor) to check-in and test prep.  All radiology patients and guests should use entrance C2 at [facility], accessed from [facility], even though the hospital's physical address listed is [address].    Please follow these instructions carefully (unless otherwise directed):  On the Night Before the Test: Be sure to Drink plenty of water. Do not consume any caffeinated/decaffeinated beverages or chocolate 12 hours prior to your test. Do not take any  antihistamines 12 hours prior to your test.  On the Day of the Test: Drink plenty of water until 1 hour prior to the test. Do not eat any food 1 hour prior to test. You may take your regular medications prior to the test.  Take metoprolol (Lopressor) ONE TABLET (1) BY MOUTH ( 50 MG) two hours prior to test. FEMALES- please wear underwire-free bra if available, avoid dresses & tight clothing    After the Test: Drink plenty of water. After receiving IV contrast, you may experience a mild flushed feeling. This is normal. On occasion, you may experience a mild rash up to 24 hours after the test. This is not dangerous. If this occurs, you can take Benadryl 25 mg and increase your fluid intake. If you experience trouble breathing, this can be serious. If it is severe call 911 IMMEDIATELY. If it is mild, please call our office.  We will call to schedule your test 2-4 weeks out understanding that some insurance companies will need an authorization prior to the service being performed.   For non-scheduling related questions, please contact the cardiac imaging nurse navigator should you have any questions/concerns: Marchia Bond, Cardiac Imaging Nurse Navigator Gordy Clement, Cardiac Imaging Nurse Navigator [place] [facility] Direct Office Dial: [phone]   For scheduling needs, including cancellations and rescheduling, please call [place], [phone].    Follow-Up: At [facility], you and your health needs are our priority.  As part of our continuing mission to provide you with exceptional heart care, we have created  designated Provider Care Teams.  These Care Teams include your primary Cardiologist (physician) and Advanced Practice Providers (APPs -  Physician Assistants and Nurse Practitioners) who all work together to provide you with the care you need, when you need it.  We recommend signing up for the patient portal called "MyChart".  Sign up information  is provided on this After Visit Summary.  MyChart is used to connect with patients for Virtual Visits (Telemedicine).  Patients are able to view lab/test results, encounter notes, upcoming appointments, etc.  Non-urgent messages can be sent to your provider as well.   To learn more about what you can do with MyChart, go to [email or web address].    Your next appointment:   2 month(s)  The format for your next appointment:   In Person  Provider:   Christen Bame, NP         Important Information About Sugar

## 2022-02-14 NOTE — Therapy (Signed)
OUTPATIENT PHYSICAL THERAPY PROGRESS NOTE   Patient Name: Danielle Garrett MRN: [id number] DOB:[birth year], 71 y.o., female Today's Date: 02/14/2022   PCP: London Pepper MD REFERRING PROVIDER: London Pepper MD   PT End of Session - 02/14/22 0928     Visit Number 8    Date for PT Re-Evaluation 03/15/22    Authorization Type Medicare    PT Start Time 0930    PT Stop Time 1013    PT Time Calculation (min) 43 min    Activity Tolerance Patient tolerated treatment well             Past Medical History:  Diagnosis Date   Allergic rhinitis due to other allergen    Anginal pain ([place]) 2011   hospitalized for CP in the past; due to low potassium   Anxiety    Arthritis    Childhood asthma    Chronic hoarseness    Gastroparesis    GERD (gastroesophageal reflux disease)    History of hiatal hernia    Hx of blood transfusion reaction    in the early 80s   Hypertension    Left bundle branch block     (abnormal heart rhythm)   PONV (postoperative nausea and vomiting)    Pyloric stenosis    Past Surgical History:  Procedure Laterality Date   [place]  10/2009   Cardiac cath normal coronary arteries   CERVICAL DISC ARTHROPLASTY     disc replacement in neck 1998   COLONOSCOPY     10 year repeat 09/26/2010   ESOPHAGOGASTRODUODENOSCOPY  09/26/2010,01/08/13   LOOP RECORDER INSERTION N/A 05/29/2016   Procedure: Loop Recorder Insertion;  Surgeon: Evans Lance, MD;  Location: [place] [facility];  Service: Cardiovascular;  Laterality: N/A;   OTHER SURGICAL HISTORY  1983   hysterectomy ovaries intact, prolapsed    OTHER SURGICAL HISTORY  2006   pyloric stricture surgery   TOTAL HIP ARTHROPLASTY Right 06/07/2015   Procedure: TOTAL HIP ARTHROPLASTY ANTERIOR APPROACH;  Surgeon: Frederik Pear, MD;  Location: [place];  Service: Orthopedics;  Laterality: Right;   Patient Active Problem List   Diagnosis Date Noted   Autonomic dysfunction  09/28/2021   Atrial fibrillation with tachycardic ventricular rate ([place]) 05/29/2016   Syncope, cardiogenic 05/24/2016   Primary osteoarthritis of right hip 06/05/2015   Pre-operative clearance 06/02/2015   Chest pain 06/02/2015   LBBB (left bundle branch block) 06/02/2015   History of left hip replacement 06/02/2015   Palpitation 10/07/2014   Constipation 08/23/2013   Nausea 08/23/2013    ONSET DATE: 04/17/21 REFERRING DIAG: R26.89 Loss of balance  THERAPY DIAG:  Decreased balance; weakness; risk of falls; right hip pain; left ankle stiffness   Rationale for Evaluation and Treatment Rehabilitation  SUBJECTIVE:  SUBJECTIVE STATEMENT:   Busy weekend but good.  My hip bothers me at night.  When I'm up on it my foot hurts but when I'm off of it it's fine.    BP: 149/95     PERTINENT HISTORY:  right THR 4-5 years ago "didn't heal correctly";   ankle fracture  plates/screws in Jan Dr. Lucia Gaskins follows and says one spot not fully healed (proposed future surgery) had 1 injection which helped pain;  hearing loss; HTN ( taken off 1 med, cardiologist wants BP a little elevated)  Started on [place] yesterday for osteoporosis  Goes by [place]: MONITOR BLOOD PRESSURE; SUPINE WITH LEGS ELEVATED  PAIN:  Are you having pain? yes NPRS scale: 2/10 Pain location:low back;  posterior hip and thigh pain with standing prolonged    Aggravating factors: sitting Relieving factors: at rest    PRECAUTIONS: Fall  WEIGHT BEARING RESTRICTIONS No  FALLS: Has patient fallen in last 6 months? No  LIVING ENVIRONMENT: Lives with: lives with their spouse Lives in: House/apartment Stairs: No PLOF: Independent  PATIENT GOALS less pain; balance "people think I'm drunk  sometimes"   OBJECTIVE:  DIAGNOSTIC FINDINGS: hip x-ray after surgery  COGNITION: Overall cognitive status: Within functional limits for tasks assessed   MUSCLE LENGTH: Decreased hip flexor lengths right > left  POSTURE:  left calf muscle atrophy  LOWER EXTREMITY ROM:   decreased right hip external rotation; decreased left ankle dorsiflexion to 4 degrees  LOWER EXTREMITY MMT:    MMT Right Eval Left Eval 10/26  Hip flexion 4 4+ 4   4+  Hip extension 4- 4+ 4    4+  Hip abduction 4- 4+ 4   4+  Hip adduction     Hip internal rotation     Hip external rotation     Knee flexion 4+ 4+ 4+   4+  Knee extension _0 Ankle dorsiflexion 5 3+ 5   3+  Ankle plantarflexion 5 3+ 5   3+  Ankle inversion 5 3+   Ankle eversion 5 3+   (Blank rows = not tested)    GAIT: Comments: no device used  FUNCTIONAL TESTs:   5 times sit to stand: 14 Right hip pain Berg Balance Scale: 42/56 Dynamic Gait Index: 11/24 Holds arms out to the side to balance with head turns, faster speeds, around obstacles SLS; left 3 sec very wobbly left ankle pain; right 5 sec also very wobbly    10/31: BERG 44/56 5x sit to stand no UEs:  10 sec no hip pain  TODAY'S TREATMENT:   10/31: Wall leans forward and to the side with march 10x each Carrying tray up and down long hallway Balloon hand to hand taps Band resistance at hips with marching while pt holding to railing: forward and and lateral directions BERG Neuromuscular re-education: all performed with gait belt on, muscle activation with verbal and tactile cues to improve stability and reduce loss of balance     10/26: Walk hallway head turns Walk 25% faster in hallway;  faster walk with arm swing;  faster walk with hands in pockets  (hands in pockets difficult) Resisted walk 4 ways 5#  max assist with forward and left side stepping; CGA only with backward and right side stepping [employer] pew sways in front of chair working on control in  retro direction Side to side weight shift with narrow base of support Narrow base of  support standing with decreasing UE support  Neuromuscular re-education: all performed with gait belt on, muscle activation with verbal and tactile cues to improve stability and reduce loss of balance       10/24: Seated 5# kbell up and over cone Sit to stand holding 5# kbell 5x Chair sit ups holding 5# kbell 10x Seated dead lifts blue band 10x Manual perturbances at hips and shoulders 4 directions Sit to stand eyes close with UE support 5x Head turns with narrow base of support Standing on foam with head turns and reaching, weight shifting Turns 1/4,1/2 and full turns, UE reaching, head turns with dual task (cognitive) Neuromuscular re-education: all performed with gait belt on, muscle activation with verbal and tactile cues to improve stability and reduce loss of balance      10/19: Seated rocker board 1 min Seated 5# kbell up and over cone Seated 5# kbell heel raise Sit to stand holding 5# kbell 5x Chair sit ups holding 5# kbell 10x Neuromuscular re-education: all performed with gait belt on, muscle activation with verbal and tactile cues to improve stability and reduce loss of balance Head turns with narrow base of support Backwards walk next to railing Sidestepping at the railing Balloon taps to wall 3 D lunges facing wall Wall pull aways using anterior tib 20x Turns 1/4,1/2 and full turns, UE reaching, head turns     PATIENT EDUCATION: Education details: plan of care  Person educated: Patient Education method: Explanation Education comprehension: verbalized understanding   HOME EXERCISE PROGRAM: Access Code: [email or web address] URL: [email or web address] Date: 02/02/2022 Prepared by: Ruben Im  Exercises - Hip Flexor Stretch at Edge of Bed (Mirrored)  - 1 x daily - 7 x weekly - 1 sets - 3 reps - 30 hold - Bridge  - 1 x daily - 7 x weekly - 1 sets - 10 reps -  Clamshell  - 1 x daily - 7 x weekly - 1 sets - 10 reps - Sit to Stand  - 1 x daily - 7 x weekly - 1 sets - 10 reps   GOALS: Goals reviewed with patient? Yes  SHORT TERM GOALS: Target date: 02/15/2022  The patient will demonstrate knowledge of basic self care strategies and exercises to promote healing   Baseline: Goal status: ongoing  2.  The patient will have improved right hip pain and strength to at least 4/5 needed for standing, walking longer distances and descending stairs at home and in the community   Baseline:  Goal status: met 10/26  3.  The patient will have an improved BERG balance score to   45 /56 indicating reduced risk of falls  Baseline:  Goal status: met 10/31  4.  Dynamic Gait Index 14/24 Baseline:  Goal status: ongoing   LONG TERM GOALS: Target date: 03/15/2022  The patient will be independent in a safe self progression of a home exercise program to promote further recovery of function   Baseline:  Goal status: INITIAL  2.  The patient will have improved right hip pain and strength to at least 4+/5 needed for standing, walking longer distances and descending stairs at home and in the community  Baseline:  Goal status: INITIAL  3.  Dynamic Gait Index improved to 18/24 indicating improved safety and decreased risk for falls Baseline:  Goal status: INITIAL  4.  The patient will have an improved BERG balance score to   48 /56 indicating reduced risk of falls  Baseline:  Goal status: INITIAL  5.  5x sit to stand improved to 12.5 sec without right hip pain Baseline:  Goal status: goal met 10/31  ASSESSMENT:  CLINICAL IMPRESSION: Good improvement in BERG balance score compared to initial assessment.  5x sit to stand test significantly improved and performed without UE use with ease.  Max assist with gait belt need several times with tray carry and narrow base of support balance challenges.      OBJECTIVE IMPAIRMENTS decreased balance, decreased  strength, and impaired perceived functional ability.   ACTIVITY LIMITATIONS carrying, lifting, bending, standing, squatting, stairs, and locomotion level  PARTICIPATION LIMITATIONS: meal prep, cleaning, laundry, shopping, community activity, and church  PERSONAL FACTORS Past/current experiences, Time since onset of injury/illness/exacerbation, and 1-2 comorbidities: osteoporosis, autonomic dysfunction, multi regions/areas affected  are also affecting patient's functional outcome.   REHAB POTENTIAL: Good  CLINICAL DECISION MAKING: moderate EVALUATION COMPLEXITY: moderate PLAN: PT FREQUENCY: 2x/week  PT DURATION: 8 weeks  PLANNED INTERVENTIONS: Therapeutic exercises, Therapeutic activity, Neuromuscular re-education, Balance training, Gait training, Patient/Family education, Self Care, Joint mobilization, Aquatic Therapy, Dry Needling, Electrical stimulation, Cryotherapy, Moist heat, Taping, Ultrasound, Ionotophoresis 63m/ml Dexamethasone, Manual therapy, and Re-evaluation  PLAN FOR NEXT SESSION: monitor BP secondary autonomic dysfunction; recheck  DGI; balance narrow base of support, head and body turns; gait belt for safety  SRuben Im PT 02/14/22 6:50 PM Phone: [phone]: [phone]

## 2022-02-15 ENCOUNTER — Telehealth: Payer: Self-pay

## 2022-02-15 ENCOUNTER — Ambulatory Visit: Payer: Medicare Other | Attending: Family Medicine | Admitting: Physical Therapy

## 2022-02-15 DIAGNOSIS — M25672 Stiffness of left ankle, not elsewhere classified: Secondary | ICD-10-CM | POA: Diagnosis not present

## 2022-02-15 DIAGNOSIS — Z9181 History of falling: Secondary | ICD-10-CM

## 2022-02-15 DIAGNOSIS — M6281 Muscle weakness (generalized): Secondary | ICD-10-CM

## 2022-02-15 DIAGNOSIS — M25551 Pain in right hip: Secondary | ICD-10-CM

## 2022-02-15 DIAGNOSIS — Z23 Encounter for immunization: Secondary | ICD-10-CM | POA: Diagnosis not present

## 2022-02-15 LAB — BASIC METABOLIC PANEL
BUN/Creatinine Ratio: 16 (ref 12–28)
BUN: 12 mg/dL (ref 8–27)
CO2: 27 mmol/L (ref 20–29)
Calcium: 9.6 mg/dL (ref 8.7–10.3)
Chloride: 97 mmol/L (ref 96–106)
Creatinine, Ser: 0.75 mg/dL (ref 0.57–1.00)
Glucose: 81 mg/dL (ref 70–99)
Potassium: 4.7 mmol/L (ref 3.5–5.2)
Sodium: 136 mmol/L (ref 134–144)
eGFR: 85 mL/min/{1.73_m2} (ref >59)

## 2022-02-15 NOTE — Patient Instructions (Signed)

## 2022-02-15 NOTE — Therapy (Signed)
OUTPATIENT PHYSICAL THERAPY PROGRESS NOTE   Patient Name: Danielle Garrett MRN: [id number] DOB:[birth year], 71 y.o., female Today's Date: 02/15/2022   PCP: London Pepper MD REFERRING PROVIDER: London Pepper MD   PT End of Session - 02/15/22 0934     Visit Number 9    Date for PT Re-Evaluation 03/15/22    Authorization Type Medicare    PT Start Time 0932    PT Stop Time 7673    PT Time Calculation (min) 43 min    Activity Tolerance Patient tolerated treatment well             Past Medical History:  Diagnosis Date   Allergic rhinitis due to other allergen    Anginal pain ([place]) 2011   hospitalized for CP in the past; due to low potassium   Anxiety    Arthritis    Childhood asthma    Chronic hoarseness    Gastroparesis    GERD (gastroesophageal reflux disease)    History of hiatal hernia    Hx of blood transfusion reaction    in the early 80s   Hypertension    Left bundle branch block     (abnormal heart rhythm)   PONV (postoperative nausea and vomiting)    Pyloric stenosis    Past Surgical History:  Procedure Laterality Date   [place]  10/2009   Cardiac cath normal coronary arteries   CERVICAL DISC ARTHROPLASTY     disc replacement in neck 1998   COLONOSCOPY     10 year repeat 09/26/2010   ESOPHAGOGASTRODUODENOSCOPY  09/26/2010,01/08/13   LOOP RECORDER INSERTION N/A 05/29/2016   Procedure: Loop Recorder Insertion;  Surgeon: Evans Lance, MD;  Location: [place] [facility];  Service: Cardiovascular;  Laterality: N/A;   OTHER SURGICAL HISTORY  1983   hysterectomy ovaries intact, prolapsed    OTHER SURGICAL HISTORY  2006   pyloric stricture surgery   TOTAL HIP ARTHROPLASTY Right 06/07/2015   Procedure: TOTAL HIP ARTHROPLASTY ANTERIOR APPROACH;  Surgeon: Frederik Pear, MD;  Location: [place] [place];  Service: Orthopedics;  Laterality: Right;   Patient Active Problem List   Diagnosis Date Noted   Autonomic dysfunction  09/28/2021   Atrial fibrillation with tachycardic ventricular rate ([place]) 05/29/2016   Syncope, cardiogenic 05/24/2016   Primary osteoarthritis of right hip 06/05/2015   Pre-operative clearance 06/02/2015   Chest pain 06/02/2015   LBBB (left bundle branch block) 06/02/2015   History of left hip replacement 06/02/2015   Palpitation 10/07/2014   Constipation 08/23/2013   Nausea 08/23/2013    ONSET DATE: 04/17/21 REFERRING DIAG: R26.89 Loss of balance  THERAPY DIAG:  Decreased balance; weakness; risk of falls; right hip pain; left ankle stiffness   Rationale for Evaluation and Treatment Rehabilitation  SUBJECTIVE:  SUBJECTIVE STATEMENT:   My hip is bothering me today.  My husband told me to tell you that it's like a bubble level that suddens drops than tilts.    BP: 151/85     PERTINENT HISTORY:  right THR 4-5 years ago "didn't heal correctly";   ankle fracture  plates/screws in Jan Dr. Lucia Gaskins follows and says one spot not fully healed (proposed future surgery) had 1 injection which helped pain;  hearing loss; HTN ( taken off 1 med, cardiologist wants BP a little elevated)  Started on [place] yesterday for osteoporosis  Goes by [place]: MONITOR BLOOD PRESSURE; SUPINE WITH LEGS ELEVATED  PAIN:  Are you having pain? yes NPRS scale: 5/10 Pain location:right posterior hip    Aggravating factors: sitting Relieving factors: at rest    PRECAUTIONS: Fall  WEIGHT BEARING RESTRICTIONS No  FALLS: Has patient fallen in last 6 months? No  LIVING ENVIRONMENT: Lives with: lives with their spouse Lives in: House/apartment Stairs: No PLOF: Independent  PATIENT GOALS less pain; balance "people think I'm drunk sometimes"   OBJECTIVE:  DIAGNOSTIC  FINDINGS: hip x-ray after surgery  COGNITION: Overall cognitive status: Within functional limits for tasks assessed   MUSCLE LENGTH: Decreased hip flexor lengths right > left  POSTURE:  left calf muscle atrophy  LOWER EXTREMITY ROM:   decreased right hip external rotation; decreased left ankle dorsiflexion to 4 degrees  LOWER EXTREMITY MMT:    MMT Right Eval Left Eval 10/26  Hip flexion 4 4+ 4   4+  Hip extension 4- 4+ 4    4+  Hip abduction 4- 4+ 4   4+  Hip adduction     Hip internal rotation     Hip external rotation     Knee flexion 4+ 4+ 4+   4+  Knee extension _0 Ankle dorsiflexion 5 3+ 5   3+  Ankle plantarflexion 5 3+ 5   3+  Ankle inversion 5 3+   Ankle eversion 5 3+   (Blank rows = not tested)    GAIT: Comments: no device used  FUNCTIONAL TESTs:   5 times sit to stand: 14 Right hip pain Berg Balance Scale: 42/56 Dynamic Gait Index: 11/24 Holds arms out to the side to balance with head turns, faster speeds, around obstacles SLS; left 3 sec very wobbly left ankle pain; right 5 sec also very wobbly    10/31: BERG 44/56 5x sit to stand no UEs:  10 sec no hip pain  TODAY'S TREATMENT:  11/1: Piriformis stretch supine Bridges 10x Bridge with clam 10x Ab brace with bent knee lift and lower 10x Seated 5# twist 10x Seated holding 5# overhead with march 2x 10 Seated double legs up and over kettlebell 10x Nu-Step L3 6 min Manual therapy: soft tissue mobilization to right glutes; with [place] but difficult to tolerate Trigger Point Dry-Needling  Treatment instructions: Expect mild to moderate muscle soreness. S/S of pneumothorax if dry needled over a lung field, and to seek immediate medical attention should they occur. Patient verbalized understanding of these instructions and education.  Patient Consent Given: Yes Education handout provided: Yes Muscles treated: right glutes in sidelying (pillow under left hip for comfort) Treatment  response/outcome: improved soft tissue length, dec tender points   Moist heat 2 min  10/31: Wall leans forward and to the side with march 10x each Carrying tray up and down long hallway Balloon hand to hand  taps Band resistance at hips with marching while pt holding to railing: forward and and lateral directions BERG Neuromuscular re-education: all performed with gait belt on, muscle activation with verbal and tactile cues to improve stability and reduce loss of balance     10/26: Walk hallway head turns Walk 25% faster in hallway;  faster walk with arm swing;  faster walk with hands in pockets  (hands in pockets difficult) Resisted walk 4 ways 5#  max assist with forward and left side stepping; CGA only with backward and right side stepping [employer] pew sways in front of chair working on control in retro direction Side to side weight shift with narrow base of support Narrow base of support standing with decreasing UE support  Neuromuscular re-education: all performed with gait belt on, muscle activation with verbal and tactile cues to improve stability and reduce loss of balance       10/24: Seated 5# kbell up and over cone Sit to stand holding 5# kbell 5x Chair sit ups holding 5# kbell 10x Seated dead lifts blue band 10x Manual perturbances at hips and shoulders 4 directions Sit to stand eyes close with UE support 5x Head turns with narrow base of support Standing on foam with head turns and reaching, weight shifting Turns 1/4,1/2 and full turns, UE reaching, head turns with dual task (cognitive) Neuromuscular re-education: all performed with gait belt on, muscle activation with verbal and tactile cues to improve stability and reduce loss of balance   PATIENT EDUCATION: Education details: DN info Person educated: Patient Education method: Explanation Education comprehension: verbalized understanding   HOME EXERCISE PROGRAM: Access Code: [email or web address] URL:  [email or web address] Date: 02/02/2022 Prepared by: Ruben Im  Exercises - Hip Flexor Stretch at Edge of Bed (Mirrored)  - 1 x daily - 7 x weekly - 1 sets - 3 reps - 30 hold - Bridge  - 1 x daily - 7 x weekly - 1 sets - 10 reps - Clamshell  - 1 x daily - 7 x weekly - 1 sets - 10 reps - Sit to Stand  - 1 x daily - 7 x weekly - 1 sets - 10 reps   GOALS: Goals reviewed with patient? Yes  SHORT TERM GOALS: Target date: 02/15/2022  The patient will demonstrate knowledge of basic self care strategies and exercises to promote healing   Baseline: Goal status: ongoing  2.  The patient will have improved right hip pain and strength to at least 4/5 needed for standing, walking longer distances and descending stairs at home and in the community   Baseline:  Goal status: met 10/26  3.  The patient will have an improved BERG balance score to   45 /56 indicating reduced risk of falls  Baseline:  Goal status: met 10/31  4.  Dynamic Gait Index 14/24 Baseline:  Goal status: ongoing   LONG TERM GOALS: Target date: 03/15/2022  The patient will be independent in a safe self progression of a home exercise program to promote further recovery of function   Baseline:  Goal status: INITIAL  2.  The patient will have improved right hip pain and strength to at least 4+/5 needed for standing, walking longer distances and descending stairs at home and in the community  Baseline:  Goal status: INITIAL  3.  Dynamic Gait Index improved to 18/24 indicating improved safety and decreased risk for falls Baseline:  Goal status: INITIAL  4.  The patient will have an improved BERG balance score to  7 /56 indicating reduced risk of falls  Baseline:  Goal status: INITIAL  5.  5x sit to stand improved to 12.5 sec without right hip pain Baseline:  Goal status: goal met 10/31  ASSESSMENT:  CLINICAL IMPRESSION: Patient complains of increased right hip pain today and was quite tender  with instrument assisted manual therapy.  Performed DN with much improved soft tissue mobility noted.  Ex focus today on core and glute strengthening.  Some HS cramping but otherwise able to perform ex's with min discomfort.    OBJECTIVE IMPAIRMENTS decreased balance, decreased strength, and impaired perceived functional ability.   ACTIVITY LIMITATIONS carrying, lifting, bending, standing, squatting, stairs, and locomotion level  PARTICIPATION LIMITATIONS: meal prep, cleaning, laundry, shopping, community activity, and church  PERSONAL FACTORS Past/current experiences, Time since onset of injury/illness/exacerbation, and 1-2 comorbidities: osteoporosis, autonomic dysfunction, multi regions/areas affected  are also affecting patient's functional outcome.   REHAB POTENTIAL: Good  CLINICAL DECISION MAKING: moderate EVALUATION COMPLEXITY: moderate PLAN: PT FREQUENCY: 2x/week  PT DURATION: 8 weeks  PLANNED INTERVENTIONS: Therapeutic exercises, Therapeutic activity, Neuromuscular re-education, Balance training, Gait training, Patient/Family education, Self Care, Joint mobilization, Aquatic Therapy, Dry Needling, Electrical stimulation, Cryotherapy, Moist heat, Taping, Ultrasound, Ionotophoresis 51m/ml Dexamethasone, Manual therapy, and Re-evaluation  PLAN FOR NEXT SESSION: monitor BP secondary autonomic dysfunction; recheck  DGI; assess response to DN; balance narrow base of support, head and body turns; gait belt for safety  SRuben Im PT 02/15/22 10:26 AM Phone: [phone]: [phone]

## 2022-02-15 NOTE — Telephone Encounter (Signed)
Pt seen at [facility] on 10/31 with Christen Bame NP.  Per Ms. Swinyer NP note, will f/u with Dr. Lovena Le / discussion on [place].

## 2022-02-19 ENCOUNTER — Encounter: Payer: Self-pay | Admitting: Internal Medicine

## 2022-02-21 ENCOUNTER — Ambulatory Visit: Payer: Medicare Other | Admitting: Physical Therapy

## 2022-02-22 DIAGNOSIS — F419 Anxiety disorder, unspecified: Secondary | ICD-10-CM | POA: Diagnosis not present

## 2022-02-22 DIAGNOSIS — M2550 Pain in unspecified joint: Secondary | ICD-10-CM | POA: Diagnosis not present

## 2022-02-22 DIAGNOSIS — M792 Neuralgia and neuritis, unspecified: Secondary | ICD-10-CM | POA: Diagnosis not present

## 2022-02-22 DIAGNOSIS — I10 Essential (primary) hypertension: Secondary | ICD-10-CM | POA: Diagnosis not present

## 2022-02-23 ENCOUNTER — Ambulatory Visit: Payer: Medicare Other | Admitting: Physical Therapy

## 2022-02-23 DIAGNOSIS — Z9181 History of falling: Secondary | ICD-10-CM | POA: Diagnosis not present

## 2022-02-23 DIAGNOSIS — M25551 Pain in right hip: Secondary | ICD-10-CM

## 2022-02-23 DIAGNOSIS — M6281 Muscle weakness (generalized): Secondary | ICD-10-CM | POA: Diagnosis not present

## 2022-02-23 DIAGNOSIS — M25672 Stiffness of left ankle, not elsewhere classified: Secondary | ICD-10-CM

## 2022-02-23 NOTE — Therapy (Signed)
OUTPATIENT PHYSICAL THERAPY PROGRESS NOTE   Patient Name: Danielle Garrett MRN: [id number] DOB:[birth year], 71 y.o., female Today's Date: 02/23/2022  Progress Note Reporting Period 01/18/22 to 02/23/22  See note below for Objective Data and Assessment of Progress/Goals.      PCP: London Pepper MD REFERRING PROVIDER: London Pepper MD   PT End of Session - 02/23/22 0933     Visit Number 10    Date for PT Re-Evaluation 03/15/22    Authorization Type Medicare    PT Start Time 0932    PT Stop Time 8115    PT Time Calculation (min) 43 min    Activity Tolerance Patient tolerated treatment well             Past Medical History:  Diagnosis Date   Allergic rhinitis due to other allergen    Anginal pain ([place]) 2011   hospitalized for CP in the past; due to low potassium   Anxiety    Arthritis    Childhood asthma    Chronic hoarseness    Gastroparesis    GERD (gastroesophageal reflux disease)    History of hiatal hernia    Hx of blood transfusion reaction    in the early 80s   Hypertension    Left bundle branch block     (abnormal heart rhythm)   PONV (postoperative nausea and vomiting)    Pyloric stenosis    Past Surgical History:  Procedure Laterality Date   [place]  10/2009   Cardiac cath normal coronary arteries   CERVICAL DISC ARTHROPLASTY     disc replacement in neck 1998   COLONOSCOPY     10 year repeat 09/26/2010   ESOPHAGOGASTRODUODENOSCOPY  09/26/2010,01/08/13   LOOP RECORDER INSERTION N/A 05/29/2016   Procedure: Loop Recorder Insertion;  Surgeon: Evans Lance, MD;  Location: [facility];  Service: Cardiovascular;  Laterality: N/A;   OTHER SURGICAL HISTORY  1983   hysterectomy ovaries intact, prolapsed    OTHER SURGICAL HISTORY  2006   pyloric stricture surgery   TOTAL HIP ARTHROPLASTY Right 06/07/2015   Procedure: TOTAL HIP ARTHROPLASTY ANTERIOR APPROACH;  Surgeon: Frederik Pear, MD;  Location: [facility];   Service: Orthopedics;  Laterality: Right;   Patient Active Problem List   Diagnosis Date Noted   Autonomic dysfunction 09/28/2021   Atrial fibrillation with tachycardic ventricular rate ([place]) 05/29/2016   Syncope, cardiogenic 05/24/2016   Primary osteoarthritis of right hip 06/05/2015   Pre-operative clearance 06/02/2015   Chest pain 06/02/2015   LBBB (left bundle branch block) 06/02/2015   History of left hip replacement 06/02/2015   Palpitation 10/07/2014   Constipation 08/23/2013   Nausea 08/23/2013    ONSET DATE: 04/17/21 REFERRING DIAG: R26.89 Loss of balance  THERAPY DIAG:  Decreased balance; weakness; risk of falls; right hip pain; left ankle stiffness   Rationale for Evaluation and Treatment Rehabilitation  SUBJECTIVE:  SUBJECTIVE STATEMENT:   I've been down with my hip/low back.  I don't think it was the DN, it's in a different spot.    I had to go to see the doctor started on Gabapentin.   I do have pinched nerve in my back.  Cancelled next week going to [place].  CT scan on 11/20, carotid study tomorrow.   BP: 153/88     PERTINENT HISTORY:  right THR 4-5 years ago "didn't heal correctly";   ankle fracture  plates/screws in Jan Dr. Lucia Gaskins follows and says one spot not fully healed (proposed future surgery) had 1 injection which helped pain;  hearing loss; HTN ( taken off 1 med, cardiologist wants BP a little elevated)  Started on [place] yesterday for osteoporosis  Goes by [place]: MONITOR BLOOD PRESSURE; SUPINE WITH LEGS ELEVATED  PAIN:  Are you having pain? yes NPRS scale: 3-4/10 Pain location:right posterior hip    Aggravating factors: sitting Relieving factors: at rest    PRECAUTIONS: Fall  WEIGHT BEARING  RESTRICTIONS No  FALLS: Has patient fallen in last 6 months? No  LIVING ENVIRONMENT: Lives with: lives with their spouse Lives in: House/apartment Stairs: No PLOF: Independent  PATIENT GOALS less pain; balance "people think I'm drunk sometimes"   OBJECTIVE:  DIAGNOSTIC FINDINGS: hip x-ray after surgery  COGNITION: Overall cognitive status: Within functional limits for tasks assessed   MUSCLE LENGTH: Decreased hip flexor lengths right > left  POSTURE:  left calf muscle atrophy  LOWER EXTREMITY ROM:   decreased right hip external rotation; decreased left ankle dorsiflexion to 4 degrees  LOWER EXTREMITY MMT:    MMT Right Eval Left Eval 10/26 11/9  Hip flexion 4 4+ 4   4+ 4   4+  Hip extension 4- 4+ 4    4+ 4   4+  Hip abduction 4- 4+ 4   4+ 4   4+  Hip adduction      Hip internal rotation      Hip external rotation      Knee flexion 4+ 4+ 4+   4+ 5  Knee extension _0 4+  Ankle dorsiflexion 5 3+ 5   3+ 5  4-  Ankle plantarflexion 5 3+ 5   3+ 5  4-  Ankle inversion 5 3+    Ankle eversion 5 3+    (Blank rows = not tested)    GAIT: Comments: no device used  FUNCTIONAL TESTs:   5 times sit to stand: 14 Right hip pain Berg Balance Scale: 42/56 Dynamic Gait Index: 11/24 Holds arms out to the side to balance with head turns, faster speeds, around obstacles SLS; left 3 sec very wobbly left ankle pain; right 5 sec also very wobbly    10/31: BERG 44/56 5x sit to stand no UEs:  10 sec no hip pain   11/9:  DGI 14/24 Turns sideways to descend stairs, bil railing use  TODAY'S TREATMENT:   11/9: Nu-Step L1 5 min while discussing status DGI In parallel bars:  1/4 turns, head turns Backwards walking Narrow base of support walking Side step over cones Random pattern stepping in all directions   11/1: Piriformis stretch supine Bridges 10x Bridge with clam 10x Ab brace with bent knee lift and lower 10x Seated 5# twist 10x Seated holding 5# overhead  with march 2x 10 Seated double legs up and over kettlebell 10x Nu-Step L3 6 min Manual therapy: soft  tissue mobilization to right glutes; with [place] but difficult to tolerate Trigger Point Dry-Needling  Treatment instructions: Expect mild to moderate muscle soreness. S/S of pneumothorax if dry needled over a lung field, and to seek immediate medical attention should they occur. Patient verbalized understanding of these instructions and education.  Patient Consent Given: Yes Education handout provided: Yes Muscles treated: right glutes in sidelying (pillow under left hip for comfort) Treatment response/outcome: improved soft tissue length, dec tender points   Moist heat 2 min  10/31: Wall leans forward and to the side with march 10x each Carrying tray up and down long hallway Balloon hand to hand taps Band resistance at hips with marching while pt holding to railing: forward and and lateral directions BERG Neuromuscular re-education: all performed with gait belt on, muscle activation with verbal and tactile cues to improve stability and reduce loss of balance     10/26: Walk hallway head turns Walk 25% faster in hallway;  faster walk with arm swing;  faster walk with hands in pockets  (hands in pockets difficult) Resisted walk 4 ways 5#  max assist with forward and left side stepping; CGA only with backward and right side stepping [employer] pew sways in front of chair working on control in retro direction Side to side weight shift with narrow base of support Narrow base of support standing with decreasing UE support  Neuromuscular re-education: all performed with gait belt on, muscle activation with verbal and tactile cues to improve stability and reduce loss of balance    PATIENT EDUCATION: Education details: DN info Person educated: Patient Education method: Explanation Education comprehension: verbalized understanding   HOME EXERCISE PROGRAM: Access Code: [email or web address] URL:  [email or web address] Date: 02/02/2022 Prepared by: Ruben Im  Exercises - Hip Flexor Stretch at Edge of Bed (Mirrored)  - 1 x daily - 7 x weekly - 1 sets - 3 reps - 30 hold - Bridge  - 1 x daily - 7 x weekly - 1 sets - 10 reps - Clamshell  - 1 x daily - 7 x weekly - 1 sets - 10 reps - Sit to Stand  - 1 x daily - 7 x weekly - 1 sets - 10 reps   GOALS: Goals reviewed with patient? Yes  SHORT TERM GOALS: Target date: 02/15/2022  The patient will demonstrate knowledge of basic self care strategies and exercises to promote healing   Baseline: Goal status: ongoing  2.  The patient will have improved right hip pain and strength to at least 4/5 needed for standing, walking longer distances and descending stairs at home and in the community   Baseline:  Goal status: met 10/26  3.  The patient will have an improved BERG balance score to   45 /56 indicating reduced risk of falls  Baseline:  Goal status: met 10/31  4.  Dynamic Gait Index 14/24 Baseline:  Goal status: met 11/9   LONG TERM GOALS: Target date: 03/15/2022  The patient will be independent in a safe self progression of a home exercise program to promote further recovery of function   Baseline:  Goal status: INITIAL  2.  The patient will have improved right hip pain and strength to at least 4+/5 needed for standing, walking longer distances and descending stairs at home and in the community  Baseline:  Goal status: INITIAL  3.  Dynamic Gait Index improved to 18/24 indicating improved safety and decreased risk for falls Baseline:  Goal status: INITIAL  4.  The patient  will have an improved BERG balance score to   48 /56 indicating reduced risk of falls  Baseline:  Goal status: INITIAL  5.  5x sit to stand improved to 12.5 sec without right hip pain Baseline:  Goal status: goal met 10/31  ASSESSMENT:  CLINICAL IMPRESSION: Improved Dynamic Gait Index from 11/24 to 14/24.  Impairments with  turning 180 degrees, head turns and ascending/descending stairs.  Loss of balance particularly in the backward directions and requires max assist from therapist to recover several times during session.  Patient uses multiple compensation strategies including wide base of support, arms out to the side and small increments when turning.  The patient would benefit from a continuation of skilled PT for a further progression of strengthening and functional mobility.  Will continue PT needed for a return to the highest functional level possible with ADLs.      OBJECTIVE IMPAIRMENTS decreased balance, decreased strength, and impaired perceived functional ability.   ACTIVITY LIMITATIONS carrying, lifting, bending, standing, squatting, stairs, and locomotion level  PARTICIPATION LIMITATIONS: meal prep, cleaning, laundry, shopping, community activity, and church  PERSONAL FACTORS Past/current experiences, Time since onset of injury/illness/exacerbation, and 1-2 comorbidities: osteoporosis, autonomic dysfunction, multi regions/areas affected  are also affecting patient's functional outcome.   REHAB POTENTIAL: Good  CLINICAL DECISION MAKING: moderate EVALUATION COMPLEXITY: moderate PLAN: PT FREQUENCY: 2x/week  PT DURATION: 8 weeks  PLANNED INTERVENTIONS: Therapeutic exercises, Therapeutic activity, Neuromuscular re-education, Balance training, Gait training, Patient/Family education, Self Care, Joint mobilization, Aquatic Therapy, Dry Needling, Electrical stimulation, Cryotherapy, Moist heat, Taping, Ultrasound, Ionotophoresis 61m/ml Dexamethasone, Manual therapy, and Re-evaluation  PLAN FOR NEXT SESSION: monitor BP secondary autonomic dysfunction; balance narrow base of support, head and body turns; gait belt for safety  SRuben Im PT 02/23/22 11:26 AM Phone: [phone]: [phone]

## 2022-02-24 ENCOUNTER — Ambulatory Visit (HOSPITAL_COMMUNITY)
Admission: RE | Admit: 2022-02-24 | Discharge: 2022-02-24 | Disposition: A | Discharge status: Home / Self Care | Payer: Medicare Other | Source: Ambulatory Visit | Attending: Cardiovascular Disease | Admitting: Cardiovascular Disease

## 2022-02-24 DIAGNOSIS — R0989 Other specified symptoms and signs involving the circulatory and respiratory systems: Secondary | ICD-10-CM | POA: Insufficient documentation

## 2022-02-24 DIAGNOSIS — R072 Precordial pain: Secondary | ICD-10-CM | POA: Insufficient documentation

## 2022-02-24 DIAGNOSIS — R55 Syncope and collapse: Secondary | ICD-10-CM | POA: Insufficient documentation

## 2022-02-27 ENCOUNTER — Encounter: Payer: Self-pay | Admitting: Internal Medicine

## 2022-02-28 ENCOUNTER — Encounter: Payer: Medicare Other | Admitting: Physical Therapy

## 2022-03-02 ENCOUNTER — Telehealth (HOSPITAL_COMMUNITY): Payer: Self-pay | Admitting: Emergency Medicine

## 2022-03-03 ENCOUNTER — Telehealth (HOSPITAL_COMMUNITY): Payer: Self-pay | Admitting: Emergency Medicine

## 2022-03-03 NOTE — Telephone Encounter (Signed)
Reaching out to patient to offer assistance regarding upcoming cardiac imaging study; pt verbalizes understanding of appt date/time, parking situation and where to check in, pre-test NPO status and medications ordered, and verified current allergies; name and call back number provided for further questions should they arise Marchia Bond RN Navigator Cardiac Imaging [facility] [phone] office [phone] cell  '50mg'$  metoprolol tartrate

## 2022-03-06 ENCOUNTER — Ambulatory Visit (HOSPITAL_COMMUNITY)
Admission: RE | Admit: 2022-03-06 | Discharge: 2022-03-06 | Disposition: A | Discharge status: Home / Self Care | Payer: Medicare Other | Source: Ambulatory Visit | Attending: Nurse Practitioner | Admitting: Nurse Practitioner

## 2022-03-06 DIAGNOSIS — R931 Abnormal findings on diagnostic imaging of heart and coronary circulation: Secondary | ICD-10-CM | POA: Diagnosis not present

## 2022-03-06 DIAGNOSIS — R072 Precordial pain: Secondary | ICD-10-CM | POA: Diagnosis not present

## 2022-03-06 MED ORDER — NITROGLYCERIN 0.4 MG SL SUBL
0.8000 mg | SUBLINGUAL_TABLET | Freq: Once | SUBLINGUAL | Status: AC
  Administered 2022-03-06: 0.8 mg via SUBLINGUAL

## 2022-03-06 MED ORDER — IOHEXOL 350 MG/ML SOLN
95.0000 mL | Freq: Once | INTRAVENOUS | Status: AC | PRN
  Administered 2022-03-06: 95 mL via INTRAVENOUS

## 2022-03-06 MED ORDER — NITROGLYCERIN 0.4 MG SL SUBL
SUBLINGUAL_TABLET | SUBLINGUAL | Status: AC
  Filled 2022-03-06: qty 2

## 2022-03-07 ENCOUNTER — Ambulatory Visit (HOSPITAL_BASED_OUTPATIENT_CLINIC_OR_DEPARTMENT_OTHER)
Admission: RE | Admit: 2022-03-07 | Discharge: 2022-03-07 | Disposition: A | Discharge status: Home / Self Care | Payer: Medicare Other | Source: Ambulatory Visit | Attending: Cardiology | Admitting: Cardiology

## 2022-03-07 ENCOUNTER — Ambulatory Visit: Payer: Medicare Other | Admitting: Physical Therapy

## 2022-03-07 ENCOUNTER — Other Ambulatory Visit: Payer: Self-pay | Admitting: *Deleted

## 2022-03-07 ENCOUNTER — Other Ambulatory Visit (HOSPITAL_COMMUNITY): Payer: Self-pay | Admitting: Emergency Medicine

## 2022-03-07 DIAGNOSIS — R931 Abnormal findings on diagnostic imaging of heart and coronary circulation: Secondary | ICD-10-CM | POA: Diagnosis not present

## 2022-03-07 DIAGNOSIS — M25551 Pain in right hip: Secondary | ICD-10-CM

## 2022-03-07 DIAGNOSIS — M6281 Muscle weakness (generalized): Secondary | ICD-10-CM | POA: Diagnosis not present

## 2022-03-07 DIAGNOSIS — M25672 Stiffness of left ankle, not elsewhere classified: Secondary | ICD-10-CM | POA: Diagnosis not present

## 2022-03-07 DIAGNOSIS — I2510 Atherosclerotic heart disease of native coronary artery without angina pectoris: Secondary | ICD-10-CM | POA: Diagnosis not present

## 2022-03-07 DIAGNOSIS — Z9181 History of falling: Secondary | ICD-10-CM

## 2022-03-07 DIAGNOSIS — R072 Precordial pain: Secondary | ICD-10-CM | POA: Diagnosis not present

## 2022-03-07 MED ORDER — APIXABAN 5 MG PO TABS: 5.0000 mg | ORAL_TABLET | Freq: Two times a day (BID) | ORAL | 11 refills | Status: DC

## 2022-03-07 NOTE — Therapy (Addendum)
OUTPATIENT PHYSICAL THERAPY PROGRESS NOTE/Discharge summary   Patient Name: Danielle Garrett MRN: [id number] DOB:12-27-1950, 71 y.o., female Today's Date: 03/07/2022   PCP: London Pepper MD REFERRING PROVIDER: London Pepper MD   PT End of Session - 03/07/22 1526     Visit Number 11    Date for PT Re-Evaluation 03/15/22    Authorization Type Medicare    PT Start Time 1628    PT Stop Time 1610    PT Time Calculation (min) 1422 min    Activity Tolerance Patient tolerated treatment well             Past Medical History:  Diagnosis Date   Allergic rhinitis due to other allergen    Anginal pain ([place]) 2011   hospitalized for CP in the past; due to low potassium   Anxiety    Arthritis    Childhood asthma    Chronic hoarseness    Gastroparesis    GERD (gastroesophageal reflux disease)    History of hiatal hernia    Hx of blood transfusion reaction    in the early 80s   Hypertension    Left bundle branch block     (abnormal heart rhythm)   PONV (postoperative nausea and vomiting)    Pyloric stenosis    Past Surgical History:  Procedure Laterality Date   [place]  10/2009   Cardiac cath normal coronary arteries   CERVICAL DISC ARTHROPLASTY     disc replacement in neck 1998   COLONOSCOPY     10 year repeat 09/26/2010   ESOPHAGOGASTRODUODENOSCOPY  09/26/2010,01/08/13   LOOP RECORDER INSERTION N/A 05/29/2016   Procedure: Loop Recorder Insertion;  Surgeon: Evans Lance, MD;  Location: [facility];  Service: Cardiovascular;  Laterality: N/A;   OTHER SURGICAL HISTORY  1983   hysterectomy ovaries intact, prolapsed    OTHER SURGICAL HISTORY  2006   pyloric stricture surgery   TOTAL HIP ARTHROPLASTY Right 06/07/2015   Procedure: TOTAL HIP ARTHROPLASTY ANTERIOR APPROACH;  Surgeon: Frederik Pear, MD;  Location: [facility];  Service: Orthopedics;  Laterality: Right;   Patient Active Problem List   Diagnosis Date Noted    Autonomic dysfunction 09/28/2021   Atrial fibrillation with tachycardic ventricular rate ([place]) 05/29/2016   Syncope, cardiogenic 05/24/2016   Primary osteoarthritis of right hip 06/05/2015   Pre-operative clearance 06/02/2015   Chest pain 06/02/2015   LBBB (left bundle branch block) 06/02/2015   History of left hip replacement 06/02/2015   Palpitation 10/07/2014   Constipation 08/23/2013   Nausea 08/23/2013    ONSET DATE: 04/17/21 REFERRING DIAG: R26.89 Loss of balance  THERAPY DIAG:  Decreased balance; weakness; risk of falls; right hip pain; left ankle stiffness   Rationale for Evaluation and Treatment Rehabilitation  SUBJECTIVE:  SUBJECTIVE STATEMENT:   My BP was good earlier today.  Good trip to [place].  I had to grab a strangers arm to keep from falling backwards, almost fall to the left.   Had CT scan showed some issues.  Picked up prescription for blood thinners ([place]).  Will follow up on Monday for stenosis/plaque.  78% chance of a heart event.  Ankle is like a toothache today.    BP: 159/93   PERTINENT HISTORY:  right THR 4-5 years ago "didn't heal correctly";   ankle fracture  plates/screws in Jan Dr. Lucia Gaskins follows and says one spot not fully healed (proposed future surgery) had 1 injection which helped pain;  hearing loss; HTN ( taken off 1 med, cardiologist wants BP a little elevated)  Started on [place] yesterday for osteoporosis  Goes by [place]: MONITOR BLOOD PRESSURE; SUPINE WITH LEGS ELEVATED  PAIN:  Are you having pain? yes NPRS scale: 3-4/10 Pain location:left ankle Aggravating factors: sitting Relieving factors: at rest    PRECAUTIONS: Fall  WEIGHT BEARING RESTRICTIONS No  FALLS: Has patient fallen in last  6 months? No  LIVING ENVIRONMENT: Lives with: lives with their spouse Lives in: House/apartment Stairs: No PLOF: Independent  PATIENT GOALS less pain; balance "people think I'm drunk sometimes"   OBJECTIVE:  DIAGNOSTIC FINDINGS: hip x-ray after surgery  COGNITION: Overall cognitive status: Within functional limits for tasks assessed   MUSCLE LENGTH: Decreased hip flexor lengths right > left  POSTURE:  left calf muscle atrophy  LOWER EXTREMITY ROM:   decreased right hip external rotation; decreased left ankle dorsiflexion to 4 degrees  LOWER EXTREMITY MMT:    MMT Right Eval Left Eval 10/26 11/9  Hip flexion 4 4+ 4   4+ 4   4+  Hip extension 4- 4+ 4    4+ 4   4+  Hip abduction 4- 4+ 4   4+ 4   4+  Hip adduction      Hip internal rotation      Hip external rotation      Knee flexion 4+ 4+ 4+   4+ 5  Knee extension _0 4+  Ankle dorsiflexion 5 3+ 5   3+ 5  4-  Ankle plantarflexion 5 3+ 5   3+ 5  4-  Ankle inversion 5 3+    Ankle eversion 5 3+    (Blank rows = not tested)    GAIT: Comments: no device used  FUNCTIONAL TESTs:   5 times sit to stand: 14 Right hip pain Berg Balance Scale: 42/56 Dynamic Gait Index: 11/24 Holds arms out to the side to balance with head turns, faster speeds, around obstacles SLS; left 3 sec very wobbly left ankle pain; right 5 sec also very wobbly    10/31: BERG 44/56 5x sit to stand no UEs:  10 sec no hip pain   11/9:  DGI 14/24 Turns sideways to descend stairs, bil railing use  TODAY'S TREATMENT:  11/21: Nu-Step 5 min while discussing status Leg press  70# bil 10x; 35# right/left 10x Weight shifting side to side, front/back arms by side Number reaching Turns to face circles on floor  10# dead lifts 2x 10 5# in each hand sit to stand press overhead Blue band resisted hip press forward and lateral direction perturbances Walk in hallway carrying purse on right shoulder and left arm swing Neuromuscular re-education:  all performed with gait belt on, muscle activation  with verbal and tactile cues to improve stability and reduce loss of balance           11/9: Nu-Step L1 5 min while discussing status DGI In parallel bars:  1/4 turns, head turns Backwards walking Narrow base of support walking Side step over cones Random pattern stepping in all directions    PATIENT EDUCATION: Education details: DN info Person educated: Patient Education method: Explanation Education comprehension: verbalized understanding   HOME EXERCISE PROGRAM: Access Code: [email or web address] URL: [email or web address] Date: 02/02/2022 Prepared by: Ruben Im  Exercises - Hip Flexor Stretch at Edge of Bed (Mirrored)  - 1 x daily - 7 x weekly - 1 sets - 3 reps - 30 hold - Bridge  - 1 x daily - 7 x weekly - 1 sets - 10 reps - Clamshell  - 1 x daily - 7 x weekly - 1 sets - 10 reps - Sit to Stand  - 1 x daily - 7 x weekly - 1 sets - 10 reps   GOALS: Goals reviewed with patient? Yes  SHORT TERM GOALS: Target date: 02/15/2022  The patient will demonstrate knowledge of basic self care strategies and exercises to promote healing   Baseline: Goal status: ongoing  2.  The patient will have improved right hip pain and strength to at least 4/5 needed for standing, walking longer distances and descending stairs at home and in the community   Baseline:  Goal status: met 10/26  3.  The patient will have an improved BERG balance score to   45 /56 indicating reduced risk of falls  Baseline:  Goal status: met 10/31  4.  Dynamic Gait Index 14/24 Baseline:  Goal status: met 11/9   LONG TERM GOALS: Target date: 03/15/2022  The patient will be independent in a safe self progression of a home exercise program to promote further recovery of function   Baseline:  Goal status: INITIAL  2.  The patient will have improved right hip pain and strength to at least 4+/5 needed for standing, walking longer distances  and descending stairs at home and in the community  Baseline:  Goal status: INITIAL  3.  Dynamic Gait Index improved to 18/24 indicating improved safety and decreased risk for falls Baseline:  Goal status: INITIAL  4.  The patient will have an improved BERG balance score to   48 /56 indicating reduced risk of falls  Baseline:  Goal status: INITIAL  5.  5x sit to stand improved to 12.5 sec without right hip pain Baseline:  Goal status: goal met 10/31  ASSESSMENT:  CLINICAL IMPRESSION: Fewer episodes of complete loss of balance requiring therapist max assist.  She is able to catch herself much better today.  She demonstrates good balance with carrying her purse on the right shoulder with left arm swing keeping faster speed and no loss of balance.  She does have increased ankle soreness at end of session but no complaints of hip pain.    OBJECTIVE IMPAIRMENTS decreased balance, decreased strength, and impaired perceived functional ability.   ACTIVITY LIMITATIONS carrying, lifting, bending, standing, squatting, stairs, and locomotion level  PARTICIPATION LIMITATIONS: meal prep, cleaning, laundry, shopping, community activity, and church  PERSONAL FACTORS Past/current experiences, Time since onset of injury/illness/exacerbation, and 1-2 comorbidities: osteoporosis, autonomic dysfunction, multi regions/areas affected  are also affecting patient's functional outcome.   REHAB POTENTIAL: Good  CLINICAL DECISION MAKING: moderate EVALUATION COMPLEXITY: moderate PLAN: PT FREQUENCY: 2x/week  PT DURATION: 8 weeks  PLANNED INTERVENTIONS: Therapeutic  exercises, Therapeutic activity, Neuromuscular re-education, Balance training, Gait training, Patient/Family education, Self Care, Joint mobilization, Aquatic Therapy, Dry Needling, Electrical stimulation, Cryotherapy, Moist heat, Taping, Ultrasound, Ionotophoresis 95m/ml Dexamethasone, Manual therapy, and Re-evaluation  PLAN FOR NEXT SESSION:  monitor BP secondary autonomic dysfunction; balance narrow base of support, head and body turns; gait belt for safety  SRuben Im PT 03/07/22 4:30 PM Phone: [phone]: [phone]  PHYSICAL THERAPY DISCHARGE SUMMARY  Visits from Start of Care: 11  Current functional level related to goals / functional outcomes: The patient cancelled her last scheduled appointments secondary to issues with new medications.  She has not returned since November.  Will discharge from PT at this time but would be happy to see Danielle Garrett the future in needed with a new referral.     Remaining deficits: As above   Education / Equipment: HEP    Patient goals were partially met. Patient is being discharged due to not returning since the last visit.   SRuben Im PT 04/25/22 8:30 AM Phone: [phone]: [phone]

## 2022-03-08 ENCOUNTER — Other Ambulatory Visit: Payer: Self-pay | Admitting: Family Medicine

## 2022-03-08 DIAGNOSIS — Z1231 Encounter for screening mammogram for malignant neoplasm of breast: Secondary | ICD-10-CM

## 2022-03-10 NOTE — Progress Notes (Signed)
Cardiology Office Note:    Date:  03/13/2022   ID:  Danielle, Garrett 11/04/1950, MRN [id number]  PCP:  Danielle Pepper, MD   [facility] Providers Cardiologist:  None     Referring MD: Danielle Pepper, MD   Chief Complaint: syncope  History of Present Illness:    Danielle Garrett is a very pleasant 71 y.o. female with a hx of HTN, neurally mediated syncope, palpitations, and LBBB. She had a loop recorder placed 2019 and removed in 2020.  Greater than 20-year history of near syncope associated with prodrome of nausea, weakness, diaphoresis.  Seen by Dr. Lovena Le and loop recorder implanted in 2019. Referred to Dr. Tamala Julian for general cardiology and seen on 06/20/21. During the active monitoring, no episodes of syncope occurred.  Over the past year or 2 she has began to have episodes of near syncope. These episodes are with prodrome. Over the previous 6 weeks she has had a dramatic increase in episodes.  In January 2023 was in [place] to help her friend recover from knee replacement surgery. On 04/20/2021, the day of her friend surgery, she developed typical prodrome shortly after they announced that her friend had successful surgery.  Due to the circumstances she did not respond as she typically does and tried to continue with normal activities despite feeling nauseated, diaphoretic, weak, and presyncopal.  In the elevator, she collapsed, shattered her ankle, and had to be admitted to the hospital for surgery.  During hospitalization, she was noted to have rapid heart rates on telemetry but no excessive episodes of bradycardia.  Had an additional episode of syncope while sitting at [place] on 05/02/2021.  States that she had a very brief prodrome before she fainted.  Additional episode while sitting on the toilet approximately 1 week prior to office visit, but she did not lose consciousness. Upset by her inability to drive.   Cardiac monitor 07/2021 revealed basic rhythm NSR with BBB  pattern, no except self bradycardia or significant tachycardia, no ventricular tachycardia, overall no findings to explain syncope  Last cardiology clinic visit was 02/02/2022 with Dr. Lovena Le.  She reported symptoms of palpitations.  Her Apple Watch shows heart rates in the 160s but no EKGs have been recorded. No recent syncope.   Seen by me on 02/14/22 for elevated HR readings at home on her Apple watch. Has sent [place] some EKG tracings. HR ranges from 40-167 bpm. Feels lightheaded with elevated HR. When walking for exercise on 10/30,  HR was 118 while walking at a fast pace then when resting HR was 137 bpm. Is very concerned that she is going to pass out again. Has resumed driving. When she becomes symptomatic, she lies down on the floor. Did not feel like the cardiac monitor was effective at monitoring because she could not keep the stickers on her chest. Had same problem with Zio monitor. Sodium and potassium tend to be low, so PCP has advised her to reduce her intake of water. She has one cup of coffee, one 16 oz water bottle, water with meds, and a small gatorade daily. Eats small frequent meals. History of hypoglycemia, however blood sugar was normal at time of syncope at hospitalization and was normal when she had a home monitor. Home BP runs high. BP at PT today was 165/95. Previously told by Drs. Lovena Le and Tamala Julian that we would tolerate hypertension to avoid causing orthostatic hypotension.  She has sharp pain below her left breast at the sternal border,  does not worsen with exertion. Feels a squeezing sensation at times, particularly when she lies on her left side. No associated symptoms with this sensation. No DOE, orthopnea, PND, or palpitations.   Home EKG 02/27/22 revealed atrial fib/flutter per review by Dr. Lovena Le. Upon receipt of her CCTA results, I realized she had not been advised to [facility] and I advised her to start Eliquis 5 mg twice daily and come in for office visit.   Today, she is here  with her husband for follow-up. Continues to note elevated HR and atrial fibrillation on home monitor. HR up to 172 bpm on one occasion, lasting approximately 5 minutes. It improved with rest over an hour or so. Reports chest feels funny, "like it is empty and someone is drumming, a flip flopping sensation at times."  Sharp pains occur separately and last 4-5 minutes, can occur up to several times per day. Feeling tired over the past few months. No edema, orthopnea, PND.  Lengthy discussion with patient and husband about managing atrial fibrillation, chest discomfort, BP, and syncope in the setting of adding additional agents and managing symptoms. No presyncope, syncope since January 2023.   Past Medical History:  Diagnosis Date   Allergic rhinitis due to other allergen    Anginal pain ([place]) 2011   hospitalized for CP in the past; due to low potassium   Anxiety    Arthritis    Childhood asthma    Chronic hoarseness    Gastroparesis    GERD (gastroesophageal reflux disease)    History of hiatal hernia    Hx of blood transfusion reaction    in the early 80s   Hypertension    Left bundle branch block     (abnormal heart rhythm)   PONV (postoperative nausea and vomiting)    Pyloric stenosis     Past Surgical History:  Procedure Laterality Date   [place]  10/2009   Cardiac cath normal coronary arteries   CERVICAL DISC ARTHROPLASTY     disc replacement in neck 1998   COLONOSCOPY     10 year repeat 09/26/2010   ESOPHAGOGASTRODUODENOSCOPY  09/26/2010,01/08/13   LOOP RECORDER INSERTION N/A 05/29/2016   Procedure: Loop Recorder Insertion;  Surgeon: Evans Lance, MD;  Location: [facility];  Service: Cardiovascular;  Laterality: N/A;   OTHER SURGICAL HISTORY  1983   hysterectomy ovaries intact, prolapsed    OTHER SURGICAL HISTORY  2006   pyloric stricture surgery   TOTAL HIP ARTHROPLASTY Right 06/07/2015   Procedure: TOTAL HIP  ARTHROPLASTY ANTERIOR APPROACH;  Surgeon: Frederik Pear, MD;  Location: [facility];  Service: Orthopedics;  Laterality: Right;    Current Medications: Current Meds  Medication Sig   albuterol (PROVENTIL HFA;VENTOLIN HFA) 108 (90 Base) MCG/ACT inhaler Inhale 1-2 puffs into the lungs every 4 (four) hours as needed for wheezing or shortness of breath.   apixaban (ELIQUIS) 5 MG TABS tablet Take 1 tablet (5 mg total) by mouth 2 (two) times daily.   fludrocortisone (FLORINEF) 0.1 MG tablet Take one tablet at 7:00 am and one tablet at 12:00 pm by mouth.   FLUoxetine (PROZAC) 10 MG capsule Take 10 mg by mouth daily.   fluticasone (FLONASE) 50 MCG/ACT nasal spray Place 2 sprays into both nostrils as needed for allergies or rhinitis.   loratadine (CLARITIN) 10 MG tablet Take 10 mg by mouth daily.   losartan (COZAAR) 100 MG tablet Take 100 mg by mouth daily.  metoprolol succinate (TOPROL XL) 25 MG 24 hr tablet Take 1/2 tablet by mouth daily for 1 week then increase it to 1 tablet by mouth daily   Multiple Vitamins-Minerals (CENTRUM SILVER 50+WOMEN PO) Take 1 tablet by mouth daily.   pantoprazole (PROTONIX) 40 MG tablet Take 40 mg by mouth 2 (two) times daily.   rosuvastatin (CRESTOR) 10 MG tablet Take 1 tablet (10 mg total) by mouth daily.   sucralfate (CARAFATE) 1 g tablet Take 1 g by mouth 2 (two) times daily as needed (Takes if Pantoprazole is not working).    VITAMIN D PO Take 1 tablet by mouth daily.   [DISCONTINUED] rosuvastatin (CRESTOR) 10 MG tablet Take 10 mg by mouth 3 (three) times a week.     Allergies:   Hydrochlorothiazide, Penicillins, Adhesive [tape], Atorvastatin calcium, Codeine, Sulfa antibiotics, Trimethoprim, and Vicodin [hydrocodone-acetaminophen]   Social History   Socioeconomic History   Marital status: Married    Spouse name: Not on file   Number of children: Not on file   Years of education: Not on file   Highest education level: Not on file  Occupational History   Not on  file  Tobacco Use   Smoking status: Never   Smokeless tobacco: Never  Vaping Use   Vaping Use: Never used  Substance and Sexual Activity   Alcohol use: No    Alcohol/week: 0.0 standard drinks of alcohol   Drug use: No   Sexual activity: Not on file  Other Topics Concern   Not on file  Social History Narrative   ** Merged History Encounter **    Right handed    Social Determinants of Health   Financial Resource Strain: Not on file  Food Insecurity: Not on file  Transportation Needs: Not on file  Physical Activity: Not on file  Stress: Not on file  Social Connections: Not on file     Family History: The patient's family history includes Cirrhosis in her father; Depression in her sister and sister; Hypertension in her father, mother, and sister; OCD in her sister.  ROS:   Please see the history of present illness.    + palpitations + chest discomfort All other systems reviewed and are negative.  Labs/Other Studies Reviewed:    The following studies were reviewed today:  Cardiac monitor 08/05/21  Basic rhythm is NSR with BBB pattern No excessive bradtcardia or significant tachycardia No ventricular tachycardia   Overall, no findings to explain syncope.  Echo ([facility]) 04/2021  LVEF 55-60%, G1DD Mild MR, moderate TR RVSP 54 mmHg  Recent Labs: 02/14/2022: BUN 12; Creatinine, Ser 0.75; Potassium 4.7; Sodium 136  Recent Lipid Panel    Risk Assessment/Calculations:     CHA2DS2-VASc Score = 3  The patient's score is based upon: CHF History: 0 HTN History: 1 Diabetes History: 0 Stroke History: 0 Vascular Disease History: 0 Age Score: 1 Gender Score: 1  {  Physical Exam:    VS:  BP (!) 160/100   Pulse 77   Ht 5' (1.524 m)   Wt 117 lb 3.2 oz (53.2 kg)   SpO2 98%   BMI 22.89 kg/m     Wt Readings from Last 3 Encounters:  03/13/22 117 lb 3.2 oz (53.2 kg)  02/14/22 116 lb (52.6 kg)  02/02/22 116 lb 3.2 oz (52.7 kg)      GEN:  Well nourished, well developed in no acute distress HEENT: Normal NECK: No JVD; Left carotid bruit CARDIAC: RRR, no  murmurs, rubs, gallops RESPIRATORY:  Clear to auscultation without rales, wheezing or rhonchi  ABDOMEN: Soft, non-tender, non-distended MUSCULOSKELETAL:  No edema; No deformity. 2+ pedal pulses, equal bilaterally SKIN: Warm and dry NEUROLOGIC:  Alert and oriented x 3 PSYCHIATRIC:  Normal affect   EKG:  EKG is ordered today and reveals normal sinus rhythm at 77 bpm, LAD, LBBB, no acute change from previous tracing.  Diagnoses:    1. PAF (paroxysmal atrial fibrillation) ([place])   2. Essential hypertension   3. LBBB (left bundle branch block)   4. Coronary artery disease involving native coronary artery of native heart without angina pectoris   5. Syncope and collapse   6. Chronic anticoagulation   7. Precordial pain   8. Hyperlipidemia LDL goal <70   9. Other fatigue     Assessment and Plan:     PAF on chronic anticoagulation: Home monitor revealed atrial fib or flutter upon review of EKG strips by Dr. Lovena Le. She was called and advised to start Eliquis 5 mg twice daily.  She reports no problems with Eliquis. EKG today reveals NSR.  We will start metoprolol succinate 25 mg daily, with plan to start at 12.5 mg daily then increase to 25 mg daily to avoid significant decrease in BP that could result in presyncope. Will refer back to Dr. Lovena Le for consideration of treatment 2/2 history of syncope. Will check TSH to ensure thyroid function is normal.  Fatigue:  Increase in symptom of fatigue. Advised this may be 2/2 a fib. Will check TSH to ensure normal thyroid function.   Syncope and collapse: Symptoms of presyncope for many years. Suffered a bad fall 04/2021 when she ignored typical prodrome. Has not been able to tolerate adhesives for external cardiac monitoring. Recently found to be in paroxysmal atrial fibrillation. Recent CCTA results with moderate stenosis of  D1 vessel felt by Dr. Tamala Julian not to be contributing to syncope. Will gradually add beta blocker for management of a fib and hypertension. Emphasized safety in setting of syncope prodrome and to notify us if this occurs.     Chest pain/Nonobstructive CAD: Continues to have symptoms of chest pain that is at times sharp and at other times described as palpitations consistent with atrial fibrillation.  Reviewed results of CCTA 11/20 with moderate stenosis of D1 felt to have borderline hemodynamic significance on FFR.  This has been reviewed by Dr. Tamala Julian and felt to be non-obstructive. Will pursue management of PAF to see if chest pain symptoms improve.  She will increase statin intensity for goal LDL < 70.   Left carotid bruit:  Bruit noted on previous exam. Minimal plaque bilateral carotid arteries on vascular ultrasound 02/24/2022. No need to repeat testing in the near future unless clinically indicated.    Hyperlipidemia LDL goal < 70: LDL 88 on 01/16/2022.  Was previously taking Crestor 10 mg 3 times per week.  She will increase to 10 mg daily.  We will recheck lipid panel in 2 months.  Hypertension: BP is elevated today and she reports has been elevated at other provider visits and at home. Previously advised by Drs. Tawny Hopping to maintain higher BP in order to avoid orthostatic hypotension. Will start low dose metoprolol for management of a fib.  Advised her to continue to monitor and report concerns to [place] prior to next visit.   Disposition: Schedule soon appointment with Dr. Dorris Singh months with me  Medication Adjustments/Labs and Tests Ordered: Current medicines are reviewed at length with the  patient today.  Concerns regarding medicines are outlined above.  Orders Placed This Encounter  Procedures   Lipid panel   ALT   TSH   EKG 12-Lead   Meds ordered this encounter  Medications   metoprolol succinate (TOPROL XL) 25 MG 24 hr tablet    Sig: Take 1/2 tablet by mouth daily for 1 week  then increase it to 1 tablet by mouth daily    Dispense:  90 tablet    Refill:  3   rosuvastatin (CRESTOR) 10 MG tablet    Sig: Take 1 tablet (10 mg total) by mouth daily.    Dispense:  90 tablet    Refill:  3    Patient Instructions  Medication Instructions:  Your physician has recommended you make the following change in your medication:   INCREASE the Cresotr to daily  START Metoprolol 25 taking 1/2 tablet daily for 1 week then increasing to 1 tablet daily   *If you need a refill on your cardiac medications before your next appointment, please call your pharmacy*   Lab Work: TODAY: TSH  2 MONTHS:  FASTING LIPID & ALT  If you have labs (blood work) drawn today and your tests are completely normal, you will receive your results only by: [place] (if you have MyChart) OR A paper copy in the mail If you have any lab test that is abnormal or we need to change your treatment, we will call you to review the results.   Testing/Procedures: None ordered   Follow-Up: At [facility], you and your health needs are our priority.  As part of our continuing mission to provide you with exceptional heart care, we have created designated Provider Care Teams.  These Care Teams include your primary Cardiologist (physician) and Advanced Practice Providers (APPs -  Physician Assistants and Nurse Practitioners) who all work together to provide you with the care you need, when you need it.  We recommend signing up for the patient portal called "MyChart".  Sign up information is provided on this After Visit Summary.  MyChart is used to connect with patients for Virtual Visits (Telemedicine).  Patients are able to view lab/test results, encounter notes, upcoming appointments, etc.  Non-urgent messages can be sent to your provider as well.   To learn more about what you can do with MyChart, go to [email or web address].    Your next appointment:   Pt needs to see Dr. Lovena Le for afib  and syncope.  If she gets to see him soon, we can cancel her appt in December with Christen Bame, NP   The format for your next appointment:     Provider:    Other Instructions   Important Information About Sugar         Signed, Emmaline Life, NP  03/13/2022 5:23 PM    [place]

## 2022-03-13 ENCOUNTER — Encounter: Payer: Self-pay | Admitting: Nurse Practitioner

## 2022-03-13 ENCOUNTER — Ambulatory Visit: Payer: Medicare Other | Attending: Nurse Practitioner | Admitting: Nurse Practitioner

## 2022-03-13 VITALS — BP 160/100 | HR 77 | Ht 60.0 in | Wt 117.2 lb

## 2022-03-13 DIAGNOSIS — I480 Paroxysmal atrial fibrillation: Secondary | ICD-10-CM | POA: Diagnosis not present

## 2022-03-13 DIAGNOSIS — R072 Precordial pain: Secondary | ICD-10-CM | POA: Diagnosis not present

## 2022-03-13 DIAGNOSIS — R5383 Other fatigue: Secondary | ICD-10-CM | POA: Diagnosis not present

## 2022-03-13 DIAGNOSIS — I447 Left bundle-branch block, unspecified: Secondary | ICD-10-CM

## 2022-03-13 DIAGNOSIS — R55 Syncope and collapse: Secondary | ICD-10-CM

## 2022-03-13 DIAGNOSIS — I4891 Unspecified atrial fibrillation: Secondary | ICD-10-CM | POA: Diagnosis not present

## 2022-03-13 DIAGNOSIS — I10 Essential (primary) hypertension: Secondary | ICD-10-CM | POA: Diagnosis not present

## 2022-03-13 DIAGNOSIS — E785 Hyperlipidemia, unspecified: Secondary | ICD-10-CM | POA: Diagnosis not present

## 2022-03-13 DIAGNOSIS — I2510 Atherosclerotic heart disease of native coronary artery without angina pectoris: Secondary | ICD-10-CM

## 2022-03-13 DIAGNOSIS — Z7901 Long term (current) use of anticoagulants: Secondary | ICD-10-CM | POA: Diagnosis not present

## 2022-03-13 MED ORDER — ROSUVASTATIN CALCIUM 10 MG PO TABS: 10.0000 mg | ORAL_TABLET | Freq: Every day | ORAL | 3 refills | Status: DC

## 2022-03-13 MED ORDER — METOPROLOL SUCCINATE ER 25 MG PO TB24: ORAL_TABLET | ORAL | 3 refills | Status: DC

## 2022-03-13 NOTE — Patient Instructions (Addendum)
Medication Instructions:  Your physician has recommended you make the following change in your medication:   INCREASE the Cresotr to daily  START Metoprolol 25 taking 1/2 tablet daily for 1 week then increasing to 1 tablet daily   *If you need a refill on your cardiac medications before your next appointment, please call your pharmacy*   Lab Work: TODAY: TSH  2 MONTHS:  FASTING LIPID & ALT  If you have labs (blood work) drawn today and your tests are completely normal, you will receive your results only by: [place] (if you have MyChart) OR A paper copy in the mail If you have any lab test that is abnormal or we need to change your treatment, we will call you to review the results.   Testing/Procedures: None ordered   Follow-Up: At [facility], you and your health needs are our priority.  As part of our continuing mission to provide you with exceptional heart care, we have created designated Provider Care Teams.  These Care Teams include your primary Cardiologist (physician) and Advanced Practice Providers (APPs -  Physician Assistants and Nurse Practitioners) who all work together to provide you with the care you need, when you need it.  We recommend signing up for the patient portal called "MyChart".  Sign up information is provided on this After Visit Summary.  MyChart is used to connect with patients for Virtual Visits (Telemedicine).  Patients are able to view lab/test results, encounter notes, upcoming appointments, etc.  Non-urgent messages can be sent to your provider as well.   To learn more about what you can do with MyChart, go to [email or web address].    Your next appointment:   Pt needs to see Dr. Lovena Le for afib and syncope.  If she gets to see him soon, we can cancel her appt in December with Christen Bame, NP   The format for your next appointment:     Provider:    Other Instructions   Important Information About Sugar

## 2022-03-14 ENCOUNTER — Ambulatory Visit: Payer: Medicare Other | Admitting: Physical Therapy

## 2022-03-14 LAB — TSH: TSH: 1.24 u[IU]/mL (ref 0.450–4.500)

## 2022-03-14 NOTE — Progress Notes (Signed)
Pt has been made aware of normal result and verbalized understanding.  jw

## 2022-03-15 ENCOUNTER — Encounter: Payer: Medicare Other | Admitting: Physical Therapy

## 2022-03-16 DIAGNOSIS — J019 Acute sinusitis, unspecified: Secondary | ICD-10-CM | POA: Diagnosis not present

## 2022-03-27 ENCOUNTER — Ambulatory Visit
Admission: RE | Admit: 2022-03-27 | Discharge: 2022-03-27 | Disposition: A | Discharge status: Home / Self Care | Payer: Medicare Other | Source: Ambulatory Visit | Attending: Family Medicine | Admitting: Family Medicine

## 2022-03-27 DIAGNOSIS — Z1231 Encounter for screening mammogram for malignant neoplasm of breast: Secondary | ICD-10-CM

## 2022-03-30 ENCOUNTER — Ambulatory Visit: Payer: Medicare Other | Attending: Internal Medicine | Admitting: Internal Medicine

## 2022-03-30 ENCOUNTER — Encounter: Payer: Self-pay | Admitting: Internal Medicine

## 2022-03-30 VITALS — BP 154/72 | HR 56 | Ht 60.0 in | Wt 118.2 lb

## 2022-03-30 DIAGNOSIS — I2510 Atherosclerotic heart disease of native coronary artery without angina pectoris: Secondary | ICD-10-CM

## 2022-03-30 DIAGNOSIS — I480 Paroxysmal atrial fibrillation: Secondary | ICD-10-CM | POA: Diagnosis not present

## 2022-03-30 MED ORDER — WARFARIN SODIUM 5 MG PO TABS: 5.0000 mg | ORAL_TABLET | ORAL | 3 refills | Status: DC

## 2022-03-30 NOTE — Progress Notes (Signed)
HPI [name] returns today for followup. She is a pleasant 71 yo woman with a h/o HTN, and long standing neurally mediated syncope. I saw her back in June. She worn a cardiac monitor which did not show any pauses or rapid heart beats. She has not passed out since then but she notes episodes of palpitations. Her Apple watch shows heart rates in the160's which have subsequently been found to be due to atrial fib. She was prescribed systemic anti-coagulation. She has been taking her eliquis only once daily. She c/o the price and wants to switch to warfarin. She has non-cardiac chest pain and is anxious after a CT scan showed non-obstructive CAD. She was placed on low dose toprol and thinks that her symptoms have improved.  Allergies  Allergen Reactions   Hydrochlorothiazide Nausea Only   Penicillins Other (See Comments)    Unknown Other reaction(s): Other (See Comments) Pt was a child   Adhesive [Tape]     Pt states ' it took my skin off' Steri-Strips   Atorvastatin Calcium     Other reaction(s): insomnia   Codeine Nausea And Vomiting   Sulfa Antibiotics Nausea And Vomiting   Trimethoprim    Vicodin [Hydrocodone-Acetaminophen] Nausea And Vomiting and Other (See Comments)    Did not feel like herself     Current Outpatient Medications  Medication Sig Dispense Refill   albuterol (PROVENTIL HFA;VENTOLIN HFA) 108 (90 Base) MCG/ACT inhaler Inhale 1-2 puffs into the lungs every 4 (four) hours as needed for wheezing or shortness of breath.     apixaban (ELIQUIS) 5 MG TABS tablet Take 1 tablet (5 mg total) by mouth 2 (two) times daily. 60 tablet 11   fludrocortisone (FLORINEF) 0.1 MG tablet Take one tablet at 7:00 am and one tablet at 12:00 pm by mouth. 60 tablet 11   FLUoxetine (PROZAC) 10 MG capsule Take 10 mg by mouth daily.     fluticasone (FLONASE) 50 MCG/ACT nasal spray Place 2 sprays into both nostrils as needed for allergies or rhinitis.     loratadine (CLARITIN) 10 MG tablet  Take 10 mg by mouth daily.     losartan (COZAAR) 100 MG tablet Take 100 mg by mouth daily.     metoprolol succinate (TOPROL XL) 25 MG 24 hr tablet Take 1/2 tablet by mouth daily for 1 week then increase it to 1 tablet by mouth daily 90 tablet 3   Multiple Vitamins-Minerals (CENTRUM SILVER 50+WOMEN PO) Take 1 tablet by mouth daily.     pantoprazole (PROTONIX) 40 MG tablet Take 40 mg by mouth 2 (two) times daily.     rosuvastatin (CRESTOR) 10 MG tablet Take 1 tablet (10 mg total) by mouth daily. 90 tablet 3   sucralfate (CARAFATE) 1 g tablet Take 1 g by mouth 2 (two) times daily as needed (Takes if Pantoprazole is not working).      VITAMIN D PO Take 1 tablet by mouth daily.     No current facility-administered medications for this visit.     Past Medical History:  Diagnosis Date   Allergic rhinitis due to other allergen    Anginal pain ([place]) 2011   hospitalized for CP in the past; due to low potassium   Anxiety    Arthritis    Childhood asthma    Chronic hoarseness    Gastroparesis    GERD (gastroesophageal reflux disease)    History of hiatal hernia    Hx of blood transfusion reaction  in the early 80s   Hypertension    Left bundle branch block     (abnormal heart rhythm)   PONV (postoperative nausea and vomiting)    Pyloric stenosis     ROS:   All systems reviewed and negative except as noted in the HPI.   Past Surgical History:  Procedure Laterality Date   ABDOMINAL HYSTERECTOMY  1983   CARDIAC CATHETERIZATION  10/2009   Cardiac cath normal coronary arteries   CERVICAL DISC ARTHROPLASTY     disc replacement in neck 1998   COLONOSCOPY     10 year repeat 09/26/2010   ESOPHAGOGASTRODUODENOSCOPY  09/26/2010,01/08/13   LOOP RECORDER INSERTION N/A 05/29/2016   Procedure: Loop Recorder Insertion;  Surgeon: Evans Lance, MD;  Location: [facility];  Service: Cardiovascular;  Laterality: N/A;   OTHER SURGICAL HISTORY  1983   hysterectomy ovaries intact, prolapsed     OTHER SURGICAL HISTORY  2006   pyloric stricture surgery   TOTAL HIP ARTHROPLASTY Right 06/07/2015   Procedure: TOTAL HIP ARTHROPLASTY ANTERIOR APPROACH;  Surgeon: Frederik Pear, MD;  Location: [facility];  Service: Orthopedics;  Laterality: Right;     Family History  Problem Relation Age of Onset   Hypertension Mother    Cirrhosis Father    Hypertension Father    OCD Sister    Depression Sister    Depression Sister    Hypertension Sister      Social History   Socioeconomic History   Marital status: Married    Spouse name: Not on file   Number of children: Not on file   Years of education: Not on file   Highest education level: Not on file  Occupational History   Not on file  Tobacco Use   Smoking status: Never   Smokeless tobacco: Never  Vaping Use   Vaping Use: Never used  Substance and Sexual Activity   Alcohol use: No    Alcohol/week: 0.0 standard drinks of alcohol   Drug use: No   Sexual activity: Not on file  Other Topics Concern   Not on file  Social History Narrative   ** Merged History Encounter **    Right handed    Social Determinants of Health   Financial Resource Strain: Not on file  Food Insecurity: Not on file  Transportation Needs: Not on file  Physical Activity: Not on file  Stress: Not on file  Social Connections: Not on file  Intimate Partner Violence: Not on file     BP (!) 154/72   Pulse (!) 56   Ht 5' (1.524 m)   Wt 118 lb 3.2 oz (53.6 kg)   SpO2 100%   BMI 23.08 kg/m   Physical Exam:  Well appearing NAD HEENT: Unremarkable Neck:  No JVD, no thyromegally Lymphatics:  No adenopathy Back:  No CVA tenderness Lungs:  Clear HEART:  Regular rate rhythm, no murmurs, no rubs, no clicks Abd:  soft, positive bowel sounds, no organomegally, no rebound, no guarding Ext:  2 plus pulses, no edema, no cyanosis, no clubbing Skin:  No rashes no nodules Neuro:  CN II through XII intact, motor grossly intact  DEVICE  Normal device  function.  See PaceArt for details.   Assess/Plan:  Syncope - she has not had any additional episodes but she has had an elevated HR at times. I encouraged her to maintain adequate hydration and salt intake HTN - her bp is controlled and we will follow. CAD - she had  no angina. She will continue her current meds. PAF - she is on systemic anti-coagulation.    Carleene Overlie Flynn Lininger,MD

## 2022-03-30 NOTE — Patient Instructions (Signed)
Medication Instructions:  WHEN FINISHES ELIQUIS MAY START WARFARIN 5 MG EVERY DAY STARTING 05/01/22 *If you need a refill on your cardiac medications before your next appointment, please call your pharmacy* NEEDS APPT IN [place] [facility]   Lab Work: NONE If you have labs (blood work) drawn today and your tests are completely normal, you will receive your results only by: [place] (if you have MyChart) OR A paper copy in the mail If you have any lab test that is abnormal or we need to change your treatment, we will call you to review the results.   Testing/Procedures: NONE   Follow-Up: At [facility], you and your health needs are our priority.  As part of our continuing mission to provide you with exceptional heart care, we have created designated Provider Care Teams.  These Care Teams include your primary Cardiologist (physician) and Advanced Practice Providers (APPs -  Physician Assistants and Nurse Practitioners) who all work together to provide you with the care you need, when you need it.  We recommend signing up for the patient portal called "MyChart".  Sign up information is provided on this After Visit Summary.  MyChart is used to connect with patients for Virtual Visits (Telemedicine).  Patients are able to view lab/test results, encounter notes, upcoming appointments, etc.  Non-urgent messages can be sent to your provider as well.   To learn more about what you can do with MyChart, go to [email or web address].    Your next appointment:   6 month(s)  The format for your next appointment:   In Person  Provider:   DR Lovena Le     Other Instructions NONE  Important Information About [place]

## 2022-04-06 DIAGNOSIS — R197 Diarrhea, unspecified: Secondary | ICD-10-CM | POA: Diagnosis not present

## 2022-04-06 DIAGNOSIS — R1012 Left upper quadrant pain: Secondary | ICD-10-CM | POA: Diagnosis not present

## 2022-04-06 DIAGNOSIS — R112 Nausea with vomiting, unspecified: Secondary | ICD-10-CM | POA: Diagnosis not present

## 2022-04-07 ENCOUNTER — Ambulatory Visit: Payer: Medicare Other | Admitting: Nurse Practitioner

## 2022-04-28 ENCOUNTER — Telehealth: Payer: Self-pay | Admitting: *Deleted

## 2022-04-28 NOTE — Patient Outreach (Signed)
  Care Coordination   04/28/2022 Name: Danielle Garrett MRN: [id number] DOB: 01-07-1951   Care Coordination Outreach Attempts:  An unsuccessful telephone outreach was attempted today to offer the patient information about available care coordination services as a benefit of their health plan.   Follow Up Plan:  Additional outreach attempts will be made to offer the patient care coordination information and services.   Encounter Outcome:  No Answer   Care Coordination Interventions:  No, not indicated    Raina Mina, RN Care Management Coordinator [place] Office [phone]

## 2022-05-08 ENCOUNTER — Ambulatory Visit: Payer: Medicare Other | Attending: Internal Medicine | Admitting: *Deleted

## 2022-05-08 DIAGNOSIS — G909 Disorder of the autonomic nervous system, unspecified: Secondary | ICD-10-CM | POA: Diagnosis not present

## 2022-05-08 DIAGNOSIS — G629 Polyneuropathy, unspecified: Secondary | ICD-10-CM | POA: Diagnosis not present

## 2022-05-08 DIAGNOSIS — R7309 Other abnormal glucose: Secondary | ICD-10-CM | POA: Diagnosis not present

## 2022-05-08 DIAGNOSIS — I4891 Unspecified atrial fibrillation: Secondary | ICD-10-CM | POA: Insufficient documentation

## 2022-05-08 DIAGNOSIS — Z5181 Encounter for therapeutic drug level monitoring: Secondary | ICD-10-CM | POA: Diagnosis not present

## 2022-05-08 DIAGNOSIS — M810 Age-related osteoporosis without current pathological fracture: Secondary | ICD-10-CM | POA: Diagnosis not present

## 2022-05-08 LAB — POCT INR: INR: 3.2 — AB (ref 2.0–3.0)

## 2022-05-08 NOTE — Patient Instructions (Addendum)
Description   Tomorrow take 1/2 tablet of warfarin then start taking 1 tablet daily except 1/2 tablet on Sundays. Recheck INR in 1 week. Anticoagulation Clinic [phone]    A full discussion of the nature of anticoagulants has been carried out.  A benefit risk analysis has been presented to the patient, so that they understand the justification for choosing anticoagulation at this time. The need for frequent and regular monitoring, precise dosage adjustment and compliance is stressed.  Side effects of potential bleeding are discussed.  The patient should avoid any OTC items containing aspirin or ibuprofen, and should avoid great swings in general diet.  Avoid alcohol consumption.  Call if any signs of abnormal bleeding.

## 2022-05-10 DIAGNOSIS — J4520 Mild intermittent asthma, uncomplicated: Secondary | ICD-10-CM | POA: Diagnosis not present

## 2022-05-10 DIAGNOSIS — M810 Age-related osteoporosis without current pathological fracture: Secondary | ICD-10-CM | POA: Diagnosis not present

## 2022-05-10 DIAGNOSIS — I10 Essential (primary) hypertension: Secondary | ICD-10-CM | POA: Diagnosis not present

## 2022-05-10 DIAGNOSIS — E785 Hyperlipidemia, unspecified: Secondary | ICD-10-CM | POA: Diagnosis not present

## 2022-05-10 DIAGNOSIS — K219 Gastro-esophageal reflux disease without esophagitis: Secondary | ICD-10-CM | POA: Diagnosis not present

## 2022-05-10 DIAGNOSIS — F4321 Adjustment disorder with depressed mood: Secondary | ICD-10-CM | POA: Diagnosis not present

## 2022-05-11 ENCOUNTER — Ambulatory Visit: Payer: Medicare Other | Admitting: Nurse Practitioner

## 2022-05-16 ENCOUNTER — Ambulatory Visit: Payer: Medicare Other | Admitting: Physician Assistant

## 2022-05-16 ENCOUNTER — Other Ambulatory Visit: Payer: Medicare Other

## 2022-05-17 ENCOUNTER — Ambulatory Visit: Payer: Medicare Other

## 2022-05-19 DIAGNOSIS — R051 Acute cough: Secondary | ICD-10-CM | POA: Diagnosis not present

## 2022-05-19 DIAGNOSIS — R509 Fever, unspecified: Secondary | ICD-10-CM | POA: Diagnosis not present

## 2022-05-19 DIAGNOSIS — U071 COVID-19: Secondary | ICD-10-CM | POA: Diagnosis not present

## 2022-05-19 DIAGNOSIS — J019 Acute sinusitis, unspecified: Secondary | ICD-10-CM | POA: Diagnosis not present

## 2022-05-19 DIAGNOSIS — R519 Headache, unspecified: Secondary | ICD-10-CM | POA: Diagnosis not present

## 2022-05-19 DIAGNOSIS — M791 Myalgia, unspecified site: Secondary | ICD-10-CM | POA: Diagnosis not present

## 2022-05-22 ENCOUNTER — Ambulatory Visit: Payer: Medicare Other

## 2022-05-22 NOTE — Progress Notes (Deleted)
Office Visit    Patient Name: Danielle Garrett Date of Encounter: 05/22/2022  PCP:  [place] Pepper, [place]  Cardiologist:  None *** Advanced Practice Provider:  No care team member to display Electrophysiologist:  None   HPI    Danielle Garrett is a 72 y.o. female with a past medical history significant for hypertension, neurally mediated syncope, palpitations, history of loop recorder placement 2019 and 2020 and LBBB presents today for follow-up visit.  The patient has a greater than 20-year history of near syncope associated with prodrome of nausea, weakness, and diaphoresis.  She has seen Dr. Lovena Le and a loop recorder was implanted in 2019.  Refer to Dr. Tamala Julian for general cardiology and seen 06/20/2021.  During the active monitoring, no episodes of syncope occurred.  Over the past year or 2 she began to have episodes of near syncope.  These episodes are with prodrome.  Over the previous 6 weeks she had a dramatic increase in episodes.  January 2023 she was in [place] to help a friend recover from knee replacement surgery.  On 04/20/2021, the day of her friend surgery, she developed typical prodrome shortly after they announced that her friend had successfully had surgery.  Due to the circumstances she did not respond as she typically does and she tried to continue with normal activities despite feeling nauseous, diaphoretic, weak, and presyncopal.  In the elevator, she collapsed, shattered her ankle, and had to be admitted to the hospital for surgery.  During hospitalization she was noted to have rapid heart rates on telemetry but no excessive episodes of bradycardia.  Had an additional episode of syncope while sitting at [place] on 05/02/2021.  States that she had a very brief prodrome before she fainted.  Additionally, episode while sitting on the toilet approximately 1 week prior to the office visit but she did not lose consciousness.  She is upset  by her inability to drive.  Cardiac monitor 07/2021 revealed basic rhythm normal sinus rhythm with BBB pattern no bradycardia or significant tachycardia, no ventricular tachycardia.  Overall, no explanation for syncope.  She was seen by Dr. Lovena Le 02/02/2022 and she reported symptoms of palpitations.  Apple Watch showed heart rates in the 160s but no EKGs have been recorded.  No recent syncopal events at that time.  Seen by Stephan Minister, NP 02/04/2022 for elevated heart rate readings at home.  Heart rate ranged from 40 to 167 bpm.  Felt lightheaded when elevated heart rate.  She is very concerned that she is going to pass out again.  Has resumed driving.  When she becomes symptomatic she lays down on the floor.  She had some problems with the ZIO monitor.  Sodium and potassium tend to be low so PCP advised her to reduce her water intake.  Blood pressure has been running a little bit high physical therapy 165/95.  She did endorse sharp pains below her left breast at the substernal border which did not worsen with exertion.  She also endorsed feeling a squeezing sensation at times.  Particular when she is laying on her side.  She was last seen by Stephan Minister, NP 03/13/2022 and at that time she continued not elevated heart rate and atrial fibrillation on home monitor.  Heart rate up to 172 bpm on occasion lasting approximately 5 minutes.  She endorsed sharp pains which occur separately last 4 to 5 minutes which can occur up to several times a  day.  Feeling tired over the last few months.  No edema orthopnea.  Lengthy discussion with the patient and has been about managing atrial fibrillation and chest discomfort in addition to blood pressure and syncope.  No presyncope or syncope since January 2023.  Today, she***  Past Medical History    Past Medical History:  Diagnosis Date   Allergic rhinitis due to other allergen    Anginal pain ([place]) 2011   hospitalized for CP in the past; due to low  potassium   Anxiety    Arthritis    Childhood asthma    Chronic hoarseness    Gastroparesis    GERD (gastroesophageal reflux disease)    History of hiatal hernia    Hx of blood transfusion reaction    in the early 80s   Hypertension    Left bundle branch block     (abnormal heart rhythm)   PONV (postoperative nausea and vomiting)    Pyloric stenosis    Past Surgical History:  Procedure Laterality Date   [place]  10/2009   Cardiac cath normal coronary arteries   CERVICAL DISC ARTHROPLASTY     disc replacement in neck 1998   COLONOSCOPY     10 year repeat 09/26/2010   ESOPHAGOGASTRODUODENOSCOPY  09/26/2010,01/08/13   LOOP RECORDER INSERTION N/A 05/29/2016   Procedure: Loop Recorder Insertion;  Surgeon: Evans Lance, MD;  Location: [facility];  Service: Cardiovascular;  Laterality: N/A;   OTHER SURGICAL HISTORY  1983   hysterectomy ovaries intact, prolapsed    OTHER SURGICAL HISTORY  2006   pyloric stricture surgery   TOTAL HIP ARTHROPLASTY Right 06/07/2015   Procedure: TOTAL HIP ARTHROPLASTY ANTERIOR APPROACH;  Surgeon: Frederik Pear, MD;  Location: [facility];  Service: Orthopedics;  Laterality: Right;    Allergies  Allergies  Allergen Reactions   Hydrochlorothiazide Nausea Only   Penicillins Other (See Comments)    Unknown Other reaction(s): Other (See Comments) Pt was a child   Adhesive [Tape]     Pt states ' it took my skin off' Steri-Strips   Atorvastatin Calcium     Other reaction(s): insomnia   Codeine Nausea And Vomiting   Sulfa Antibiotics Nausea And Vomiting   Trimethoprim    Vicodin [Hydrocodone-Acetaminophen] Nausea And Vomiting and Other (See Comments)    Did not feel like herself     EKGs/Labs/Other Studies Reviewed:   The following studies were reviewed today: Cardiac monitor 08/05/21   Basic rhythm is NSR with BBB pattern No excessive bradtcardia or significant tachycardia No ventricular  tachycardia   Overall, no findings to explain syncope.   Echo ([facility]) 04/2021   LVEF 55-60%, G1DD Mild MR, moderate TR RVSP 54 mmHg  EKG:  EKG is *** ordered today.  The ekg ordered today demonstrates ***  Recent Labs: 02/14/2022: BUN 12; Creatinine, Ser 0.75; Potassium 4.7; Sodium 136 03/13/2022: TSH 1.240  Recent Lipid Panel    Component Value Date/Time   CHOL (H) 11/09/2009 0215    216        ATP III CLASSIFICATION:  <200     mg/dL   Desirable  200-239  mg/dL   Borderline High  >=240    mg/dL   High          TRIG 71 11/09/2009 0215   HDL 69 11/09/2009 0215   CHOLHDL 3.1 11/09/2009 0215   VLDL 14 11/09/2009 0215   LDLCALC (H) 11/09/2009  0215    133        Total Cholesterol/HDL:CHD Risk Coronary Heart Disease Risk Table                     Men   Women  1/2 Average Risk   3.4   3.3  Average Risk       5.0   4.4  2 X Average Risk   9.6   7.1  3 X Average Risk  23.4   11.0        Use the calculated Patient Ratio above and the CHD Risk Table to determine the patient's CHD Risk.        ATP III CLASSIFICATION (LDL):  <100     mg/dL   Optimal  100-129  mg/dL   Near or Above                    Optimal  130-159  mg/dL   Borderline  160-189  mg/dL   High  >190     mg/dL   Very High    Risk Assessment/Calculations:  {Does this patient have ATRIAL FIBRILLATION?:[phone]}  Home Medications   No outpatient medications have been marked as taking for the 05/23/22 encounter (Appointment) with Elgie Collard, PA-C.     Review of Systems   ***   All other systems reviewed and are otherwise negative except as noted above.  Physical Exam    VS:  There were no vitals taken for this visit. , BMI There is no height or weight on file to calculate BMI.  Wt Readings from Last 3 Encounters:  03/30/22 118 lb 3.2 oz (53.6 kg)  03/13/22 117 lb 3.2 oz (53.2 kg)  02/14/22 116 lb (52.6 kg)     GEN: Well nourished, well developed, in no acute  distress. HEENT: normal. Neck: Supple, no JVD, carotid bruits, or masses. Cardiac: ***RRR, no murmurs, rubs, or gallops. No clubbing, cyanosis, edema.  ***Radials/PT 2+ and equal bilaterally.  Respiratory:  ***Respirations regular and unlabored, clear to auscultation bilaterally. GI: Soft, nontender, nondistended. MS: No deformity or atrophy. Skin: Warm and dry, no rash. Neuro:  Strength and sensation are intact. Psych: Normal affect.  Assessment & Plan    PAF HTN LBBB Coronary artery disease Syncope and collapse Chronic anticoagulation Precordial pain HLD Fatigue  No BP recorded.  {Refresh Note OR Click here to enter BP  :1}***      Disposition: Follow up {follow up:15908} with None or APP.  Signed, Elgie Collard, PA-C 05/22/2022, 7:56 AM [place]

## 2022-05-23 ENCOUNTER — Other Ambulatory Visit: Payer: Medicare Other

## 2022-05-23 ENCOUNTER — Ambulatory Visit: Payer: Medicare Other | Admitting: Physician Assistant

## 2022-05-23 DIAGNOSIS — Z7901 Long term (current) use of anticoagulants: Secondary | ICD-10-CM

## 2022-05-23 DIAGNOSIS — I2510 Atherosclerotic heart disease of native coronary artery without angina pectoris: Secondary | ICD-10-CM

## 2022-05-23 DIAGNOSIS — I447 Left bundle-branch block, unspecified: Secondary | ICD-10-CM

## 2022-05-23 DIAGNOSIS — E785 Hyperlipidemia, unspecified: Secondary | ICD-10-CM

## 2022-05-23 DIAGNOSIS — I10 Essential (primary) hypertension: Secondary | ICD-10-CM

## 2022-05-23 DIAGNOSIS — R072 Precordial pain: Secondary | ICD-10-CM

## 2022-05-23 DIAGNOSIS — R55 Syncope and collapse: Secondary | ICD-10-CM

## 2022-05-23 DIAGNOSIS — I480 Paroxysmal atrial fibrillation: Secondary | ICD-10-CM

## 2022-05-29 ENCOUNTER — Ambulatory Visit: Payer: Medicare Other | Attending: Cardiology

## 2022-05-29 DIAGNOSIS — Z5181 Encounter for therapeutic drug level monitoring: Secondary | ICD-10-CM | POA: Diagnosis not present

## 2022-05-29 DIAGNOSIS — I4891 Unspecified atrial fibrillation: Secondary | ICD-10-CM | POA: Diagnosis not present

## 2022-05-29 LAB — POCT INR: INR: 1.5 — AB (ref 2.0–3.0)

## 2022-05-29 NOTE — Patient Instructions (Signed)
Description   Take 1.5 tablets today and then START taking 1 tablet daily.  Stay consistent with green each week.  Recheck INR in 1 week.  Anticoagulation Clinic [phone]

## 2022-06-05 ENCOUNTER — Ambulatory Visit: Payer: Medicare Other | Attending: Cardiovascular Disease | Admitting: *Deleted

## 2022-06-05 DIAGNOSIS — Z5181 Encounter for therapeutic drug level monitoring: Secondary | ICD-10-CM | POA: Diagnosis not present

## 2022-06-05 DIAGNOSIS — I4891 Unspecified atrial fibrillation: Secondary | ICD-10-CM | POA: Insufficient documentation

## 2022-06-05 LAB — POCT INR: INR: 3.2 — AB (ref 2.0–3.0)

## 2022-06-05 NOTE — Patient Instructions (Signed)
Take warfarin 1/2 tablet today then resume 1 tablet daily.  Stay consistent with green each week.  Recheck INR in 2 week.  Anticoagulation Clinic [phone]

## 2022-06-08 ENCOUNTER — Telehealth: Payer: Self-pay | Admitting: Cardiovascular Disease

## 2022-06-08 DIAGNOSIS — I10 Essential (primary) hypertension: Secondary | ICD-10-CM

## 2022-06-08 NOTE — Telephone Encounter (Signed)
Spoke with Cammy Copa, [place]. She gave the following recommendations:  -Ask how pt is checking BP -Has her monitor/cuff been validated -Pt needs to check BP and hr 2 hours after taking BP meds- sit down for about 5 min and rest, then take it. -Pt needs to check BP at other places to compare readings with their own. Regulatory affairs officer, Pharmacy, [facility]) -Log BP for 5-7 days and send in the readings to [place] via MyChart -Have patient follow up with HTN Clinic- (Referral placed to HTN clinic)- Bring cuff/monitor and BP log to appt.  Pt checks BP using a cuff on arm. She states that her BP reading was about the same as what they got at the office when she checked/went about a month ago.  She said she rechecked her BP at 1000= 153/97. Her headache is going away. She takes tylenol when needed and it helps.   Gave the patient the above information. She verbalized understanding of all instructions. She will send in her BP log after 7 days. Told her to notify us if she develops any new or worsening symptoms. Given ER precautions

## 2022-06-08 NOTE — Telephone Encounter (Signed)
    Pt c/o BP issue: STAT if pt c/o blurred vision, one-sided weakness or slurred speech   1. What are your last 5 BP readings? 154/103   2. Are you having any other symptoms (ex. Dizziness, headache, blurred vision, passed out)? Bad headache    3. What is your BP issue? Pt said, her BP is elevated and been fluctuating the last  couple of days. She's a previous pt of Dr. Tamala Julian and establish to Dr. Loletha Grayer. She wants to know what she needs to do if she needs to be seen sooner    Been running high 157/103  hr 172; this BP was taken before she took her BP meds this morning. She takes all her BP meds in the morning.   Has had high bp for a while. She is currently taking Losartan 150m daily and Metoprolol 239mdaily. She used to take another BP med to help, but she would have syncopal episodes- autonomic dysfunction- when her BP gets down too low. So she was taken off of the other med so that it would not decrease her BP too much. She also has A-fib- she states that she is not in A-fib right now. She said that her hr fluctuates, it does not "stay at 172" She reports that right now her hr=92.  She reports no blurred vision, no chest pain, just having a bad headache.  Asked for last 5 BP and hr readings: (some of these were probably before she took her BP meds)  160/100   hr 42-136 137/90    hr 46-129 162/104   hr 51-143 146/97    hr 63-140 157/103  hr 82-172  She reports that she was told that she needs to eat a lot of salt- So Dr TaLovena Leold her to eat foods with plenty of sodium.  She was told to limit her fluid intake; she only takes in about 32-40 ounces a day; before she would take in about 64oz a day.   I told her that we need BP and hr readings 1-2 hours after she takes her BP meds to see "how they are working." She took her meds at 0830 this morning; asked her to retake her BP AT 0922= 167/106 HR=78, but she has been on phone talking, etc..  Told her I would give this info to the provider  and call her back, asked her to rest and check BP at 1000

## 2022-06-08 NOTE — Telephone Encounter (Signed)
   Pt c/o BP issue: STAT if pt c/o blurred vision, one-sided weakness or slurred speech  1. What are your last 5 BP readings? 154/103  2. Are you having any other symptoms (ex. Dizziness, headache, blurred vision, passed out)? Bad headache   3. What is your BP issue? Pt said, her BP is elevated and been fluctuating the last  couple of days. She's a previous pt of Dr. Tamala Julian and establish to Dr. Loletha Grayer. She wants to know what she needs to do if she needs to be seen sooner

## 2022-06-09 NOTE — Telephone Encounter (Signed)
Croitoru, Mihai, MD  You21 minutes ago (3:05 PM)   While it is important to have a relatively liberal intake of sodium to prevent syncope, I would recommend retreating "halfway" to help lessen that BP. Agree w advice from PharmD.  Called the patient and gave her the "above message," she verbalized understanding.

## 2022-06-15 ENCOUNTER — Ambulatory Visit: Payer: Medicare Other | Attending: Internal Medicine | Admitting: Student

## 2022-06-15 ENCOUNTER — Encounter: Payer: Self-pay | Admitting: Student

## 2022-06-15 VITALS — BP 185/101 | HR 57

## 2022-06-15 DIAGNOSIS — I10 Essential (primary) hypertension: Secondary | ICD-10-CM | POA: Insufficient documentation

## 2022-06-15 DIAGNOSIS — I2510 Atherosclerotic heart disease of native coronary artery without angina pectoris: Secondary | ICD-10-CM

## 2022-06-15 NOTE — Assessment & Plan Note (Addendum)
Assessment: BP is uncontrolled in office BP 177/106 with heart rate 57  Takes metoprolol XL 25 mg at night and losartan 100 mg in the morning  Tolerates them well without any side effects Denies SOB, palpitation, chest pain, headaches,or swelling Home BP fluctuate from 124 -170 /90-100  and she reports she gets stressed at the OV She has been eating salty food to keep up with low sodium but it raising her BP     Plan:  Continue taking metoprolol Xl 25 mg daily, losartan 100 mg daily  Given her syncope problem and not having persistent pattern on amount of salt intakes we wont change any medications  Patient to keep food diary with rough salt intake per day with BP log and bring at the next OV Patient to bring BP monitor for validation at the next OV Patient to see PharmD in 4 weeks for follow up  Follow up lab(s): none

## 2022-06-15 NOTE — Progress Notes (Signed)
Patient ID: Danielle Garrett                 DOB: 18-Oct-1950                      MRN: [id number]      HPI: Danielle Garrett is a 72 y.o. female referred by Dr.Croitoru  to HTN clinic. PMH is significant for HTN, and long standing neurally mediated syncope, Afib, LBBB.   On OV with Dub Mikes, patient was put on metoprolol succinate 25 mg daily for atrial fibrillation/hypertension, to avoid presyncope metoprolol was started at the lowest dose first then increased to 25 mg. On Jun 08, 2022 via phone she reported her BP is persistently elevated.   Patient presented with BP  log toady. Forgot to bring home BP monitor. Reports at home BP ~145-150/90-100 range and her HR varies from 40 to 172. Her HR varies even when she is resting. She has syncope problem and electrolyte depletion problem especially with sodium. Her syncope problem gone worst over last 10 years. Reports she gets dizzy when she changes her position too quickly. Due to her sodium problem Dr.Taylor has advised her to up the salt intake and her goal BP is 140/80. However increased sodium intake raising BP. She stays active around the house and walks her dog twice per day. She eats home cooked meals with liberal amount of table salt /salty snacks and drinks only 40 oz of water per day as per her doctor advise.   Current HTN meds: metoprolol Xl 25 mg daily, losartan 100 mg daily  BP goal: <140/80  Family History:  Mother- heart issues Sisters- cancer Older sister - heart issues   Social History:  Alcohol: none Smoking: never  Diet: increased salt intake, potasium and  tend to be low  Drink - 40 oz of water per day  No sodas, no juice,   Eats lots of greens with chicken, fish. Loves fruits and nuts   Exercise: walking - 60 min per day (~ 6000-8000 steps/day)   Home BP readings: ~145-150/90-100   Wt Readings from Last 3 Encounters:  03/30/22 118 lb 3.2 oz (53.6 kg)  03/13/22 117 lb 3.2 oz (53.2 kg)  02/14/22 116 lb  (52.6 kg)   BP Readings from Last 3 Encounters:  06/15/22 (!) 185/101  03/30/22 (!) 154/72  03/13/22 (!) 160/100   Pulse Readings from Last 3 Encounters:  06/15/22 (!) 57  03/30/22 (!) 56  03/13/22 77    Renal function: CrCl cannot be calculated (Patient's most recent lab result is older than the maximum 21 days allowed.).  Past Medical History:  Diagnosis Date   Allergic rhinitis due to other allergen    Anginal pain ([place]) 2011   hospitalized for CP in the past; due to low potassium   Anxiety    Arthritis    Childhood asthma    Chronic hoarseness    Gastroparesis    GERD (gastroesophageal reflux disease)    History of hiatal hernia    Hx of blood transfusion reaction    in the early 80s   Hypertension    Left bundle branch block     (abnormal heart rhythm)   PONV (postoperative nausea and vomiting)    Pyloric stenosis     Current Outpatient Medications on File Prior to Visit  Medication Sig Dispense Refill   albuterol (PROVENTIL HFA;VENTOLIN HFA) 108 (90 Base) MCG/ACT inhaler Inhale 1-2 puffs into the lungs every  4 (four) hours as needed for wheezing or shortness of breath.     fluticasone (FLONASE) 50 MCG/ACT nasal spray Place 2 sprays into both nostrils as needed for allergies or rhinitis.     loratadine (CLARITIN) 10 MG tablet Take 10 mg by mouth daily.     losartan (COZAAR) 100 MG tablet Take 100 mg by mouth daily.     metoprolol succinate (TOPROL XL) 25 MG 24 hr tablet Take 1/2 tablet by mouth daily for 1 week then increase it to 1 tablet by mouth daily 90 tablet 3   Multiple Vitamins-Minerals (CENTRUM SILVER 50+WOMEN PO) Take 1 tablet by mouth daily.     pantoprazole (PROTONIX) 40 MG tablet Take 40 mg by mouth 2 (two) times daily.     rosuvastatin (CRESTOR) 10 MG tablet Take 1 tablet (10 mg total) by mouth daily. 90 tablet 3   sucralfate (CARAFATE) 1 g tablet Take 1 g by mouth 2 (two) times daily as needed (Takes if Pantoprazole is not working).      VITAMIN D  PO Take 1 tablet by mouth daily.     warfarin (COUMADIN) 5 MG tablet Take 1 tablet (5 mg total) by mouth as directed. 90 tablet 3   No current facility-administered medications on file prior to visit.    Allergies  Allergen Reactions   Hydrochlorothiazide Nausea Only   Penicillins Other (See Comments)    Unknown Other reaction(s): Other (See Comments) Pt was a child   Adhesive [Tape]     Pt states ' it took my skin off' Steri-Strips   Atorvastatin Calcium     Other reaction(s): insomnia   Codeine Nausea And Vomiting   Sulfa Antibiotics Nausea And Vomiting   Trimethoprim    Vicodin [Hydrocodone-Acetaminophen] Nausea And Vomiting and Other (See Comments)    Did not feel like herself    Blood pressure (!) 185/101, pulse (!) 57, SpO2 96 %.   Assessment/Plan:  1. Hypertension -  Hypertension Assessment: BP is uncontrolled in office BP 177/106 with heart rate 57  Takes metoprolol XL 25 mg at night and losartan 100 mg in the morning  Tolerates them well without any side effects Denies SOB, palpitation, chest pain, headaches,or swelling Home BP fluctuate from 124 -170 /90-100  and she reports she gets little tensed at the OV She has been eating salty food to keep up with low sodium but it raising her BP     Plan:  Continue taking metoprolol Xl 25 mg daily, losartan 100 mg daily  Given her syncope problem and not having persistent pattern on amount of salt intakes we wont change any medications  Patient to keep food diary with rough salt intake per day with BP log and bring at the next OV Patient to bring BP monitor for validation at the next OV Patient to see PharmD in 4 weeks for follow up  Follow up lab(s): none    Thank you  Cammy Copa, Pharm.D [facility] A Division of [facility] [address] [place], [place] [address]  Phone: [phone]; Fax: [phone]

## 2022-06-15 NOTE — Patient Instructions (Signed)
No changes made by your pharmacist Cammy Copa, PharmD at today's visit:    Bring all of your meds, your BP cuff and your record of home blood pressures to your next appointment.    HOW TO TAKE YOUR BLOOD PRESSURE AT HOME  Rest 5 minutes before taking your blood pressure.  Don't smoke or drink caffeinated beverages for at least 30 minutes before. Take your blood pressure before (not after) you eat. Sit comfortably with your back supported and both feet on the floor (don't cross your legs). Elevate your arm to heart level on a table or a desk. Use the proper sized cuff. It should fit smoothly and snugly around your bare upper arm. There should be enough room to slip a fingertip under the cuff. The bottom edge of the cuff should be 1 inch above the crease of the elbow. Ideally, take 3 measurements at one sitting and record the average.  Important lifestyle changes to control high blood pressure  Intervention  Effect on the BP  Lose extra pounds and watch your waistline Weight loss is one of the most effective lifestyle changes for controlling blood pressure. If you're overweight or obese, losing even a small amount of weight can help reduce blood pressure. Blood pressure might go down by about 1 millimeter of mercury (mm Hg) with each kilogram (about 2.2 pounds) of weight lost.  Exercise regularly As a general goal, aim for at least 30 minutes of moderate physical activity every day. Regular physical activity can lower high blood pressure by about 5 to 8 mm Hg.  Eat a healthy diet Eating a diet rich in whole grains, fruits, vegetables, and low-fat dairy products and low in saturated fat and cholesterol. A healthy diet can lower high blood pressure by up to 11 mm Hg.  Reduce salt (sodium) in your diet Even a small reduction of sodium in the diet can improve heart health and reduce high blood pressure by about 5 to 6 mm Hg.  Limit alcohol One drink equals 12 ounces of beer, 5 ounces of wine,  or 1.5 ounces of 80-proof liquor.  Limiting alcohol to less than one drink a day for women or two drinks a day for men can help lower blood pressure by about 4 mm Hg.   If you have any questions or concerns please use My Chart to send questions or call the office at [phone]

## 2022-06-21 ENCOUNTER — Ambulatory Visit: Payer: Medicare Other | Attending: Internal Medicine | Admitting: Pharmacist

## 2022-06-21 DIAGNOSIS — I4891 Unspecified atrial fibrillation: Secondary | ICD-10-CM

## 2022-06-21 DIAGNOSIS — Z5181 Encounter for therapeutic drug level monitoring: Secondary | ICD-10-CM | POA: Diagnosis not present

## 2022-06-21 LAB — POCT INR: INR: 2.2 (ref 2.0–3.0)

## 2022-06-21 NOTE — Patient Instructions (Addendum)
Description   Continue taking 1 tablet daily.  Stay consistent with greens each week.  Recheck INR in 4 week.  Anticoagulation Clinic [phone]

## 2022-06-28 ENCOUNTER — Encounter: Payer: Self-pay | Admitting: Internal Medicine

## 2022-07-04 ENCOUNTER — Telehealth: Payer: Self-pay

## 2022-07-04 DIAGNOSIS — Z79899 Other long term (current) drug therapy: Secondary | ICD-10-CM

## 2022-07-04 DIAGNOSIS — I4891 Unspecified atrial fibrillation: Secondary | ICD-10-CM

## 2022-07-04 NOTE — Telephone Encounter (Signed)
Pt called back per MyChart message received.   Pt states her weight has consistently fluctuated between 115 to 118 lbs as an adult.  Pt states her weight is 122 lbs now.   Pt states she is active every day, obtaining 6 to 8000 steps per day, sometimes 10000.   Pt states she has not changed anything, and is very routine oriented, and dose not understand the slight weight gain?  Pt asked questions regarding fluid build up, edema in lower extremities, shortness of breath, wet cough... but Pt stated she knows what fluid build up is, and does NOT have any symptoms like these.    I will consult Pharm-D to see if one of her medications could be contributing to her unexplained Weight gain.  Pt states she feels fine, and has no other symptoms. I will MyChart message her a follow up once heard back from Pharm-D.

## 2022-07-04 NOTE — Telephone Encounter (Signed)
Could potentially be from her metoprolol. Weight gain isn't a common side effect but is reported with beta blocker use, and is more commonly seen in older beta blockers like metoprolol and atenolol. Should not be coming from her other meds.  She's on metoprolol for her afib. Could try substituting with comparable dose of carvedilol 3.125mg  BID which would help her BP a bit more too. Still a possibility of weight gain with carvedilol but seems to be lower incidence.

## 2022-07-05 DIAGNOSIS — K219 Gastro-esophageal reflux disease without esophagitis: Secondary | ICD-10-CM | POA: Diagnosis not present

## 2022-07-05 DIAGNOSIS — F4321 Adjustment disorder with depressed mood: Secondary | ICD-10-CM | POA: Diagnosis not present

## 2022-07-05 DIAGNOSIS — I10 Essential (primary) hypertension: Secondary | ICD-10-CM | POA: Diagnosis not present

## 2022-07-05 DIAGNOSIS — I480 Paroxysmal atrial fibrillation: Secondary | ICD-10-CM | POA: Diagnosis not present

## 2022-07-05 DIAGNOSIS — E785 Hyperlipidemia, unspecified: Secondary | ICD-10-CM | POA: Diagnosis not present

## 2022-07-05 DIAGNOSIS — J4520 Mild intermittent asthma, uncomplicated: Secondary | ICD-10-CM | POA: Diagnosis not present

## 2022-07-05 DIAGNOSIS — M810 Age-related osteoporosis without current pathological fracture: Secondary | ICD-10-CM | POA: Diagnosis not present

## 2022-07-13 DIAGNOSIS — M546 Pain in thoracic spine: Secondary | ICD-10-CM | POA: Diagnosis not present

## 2022-07-13 DIAGNOSIS — M62830 Muscle spasm of back: Secondary | ICD-10-CM | POA: Diagnosis not present

## 2022-07-13 DIAGNOSIS — M9905 Segmental and somatic dysfunction of pelvic region: Secondary | ICD-10-CM | POA: Diagnosis not present

## 2022-07-13 DIAGNOSIS — M549 Dorsalgia, unspecified: Secondary | ICD-10-CM | POA: Diagnosis not present

## 2022-07-13 DIAGNOSIS — M9903 Segmental and somatic dysfunction of lumbar region: Secondary | ICD-10-CM | POA: Diagnosis not present

## 2022-07-13 DIAGNOSIS — M9902 Segmental and somatic dysfunction of thoracic region: Secondary | ICD-10-CM | POA: Diagnosis not present

## 2022-07-17 DIAGNOSIS — M9903 Segmental and somatic dysfunction of lumbar region: Secondary | ICD-10-CM | POA: Diagnosis not present

## 2022-07-17 DIAGNOSIS — M9905 Segmental and somatic dysfunction of pelvic region: Secondary | ICD-10-CM | POA: Diagnosis not present

## 2022-07-17 DIAGNOSIS — M62830 Muscle spasm of back: Secondary | ICD-10-CM | POA: Diagnosis not present

## 2022-07-17 DIAGNOSIS — M546 Pain in thoracic spine: Secondary | ICD-10-CM | POA: Diagnosis not present

## 2022-07-17 DIAGNOSIS — M9902 Segmental and somatic dysfunction of thoracic region: Secondary | ICD-10-CM | POA: Diagnosis not present

## 2022-07-19 ENCOUNTER — Ambulatory Visit: Payer: Medicare Other | Attending: Cardiology | Admitting: Pharmacist Clinician (PhC)/ Clinical Pharmacy Specialist

## 2022-07-19 ENCOUNTER — Encounter: Payer: Self-pay | Admitting: Pharmacist Clinician (PhC)/ Clinical Pharmacy Specialist

## 2022-07-19 ENCOUNTER — Ambulatory Visit (INDEPENDENT_AMBULATORY_CARE_PROVIDER_SITE_OTHER): Payer: Medicare Other | Admitting: *Deleted

## 2022-07-19 VITALS — BP 174/107 | HR 77 | Wt 122.0 lb

## 2022-07-19 DIAGNOSIS — Z5181 Encounter for therapeutic drug level monitoring: Secondary | ICD-10-CM | POA: Insufficient documentation

## 2022-07-19 DIAGNOSIS — I10 Essential (primary) hypertension: Secondary | ICD-10-CM | POA: Diagnosis not present

## 2022-07-19 DIAGNOSIS — I4891 Unspecified atrial fibrillation: Secondary | ICD-10-CM | POA: Insufficient documentation

## 2022-07-19 LAB — POCT INR: INR: 4.9 — AB (ref 2.0–3.0)

## 2022-07-19 MED ORDER — OLMESARTAN MEDOXOMIL 40 MG PO TABS: 40.0000 mg | ORAL_TABLET | Freq: Every day | ORAL | 3 refills | Status: DC

## 2022-07-19 MED ORDER — CARVEDILOL 3.125 MG PO TABS: 3.1250 mg | ORAL_TABLET | Freq: Two times a day (BID) | ORAL | 3 refills | Status: DC

## 2022-07-19 NOTE — Patient Instructions (Signed)
Follow up appointment: May 20 at 9:30 am  Go to the lab today to check sodium level  Take your BP meds as follows:    STOP LOSARTAN  START OLMESARTAN 40 MG ONCE DAILY  Check your blood pressure at home daily and keep record of the readings.  Hypertension "High blood pressure"  Hypertension is often called "The Silent Killer." It rarely causes symptoms until it is extremely  high or has done damage to other organs in the body. For this reason, you should have your  blood pressure checked regularly by your physician. We will check your blood pressure  every time you see a provider at one of our offices.   Your blood pressure reading consists of two numbers. Ideally, blood pressure should be  below 120/80. The first ("top") number is called the systolic pressure. It measures the  pressure in your arteries as your heart beats. The second ("bottom") number is called the diastolic pressure. It measures the pressure in your arteries as the heart relaxes between beats.  The benefits of getting your blood pressure under control are enormous. A 10-point  reduction in systolic blood pressure can reduce your risk of stroke by 27% and heart failure by 28%  Your blood pressure goal is < 140/80  To check your pressure at home you will need to:  1. Sit up in a chair, with feet flat on the floor and back supported. Do not cross your ankles or legs. 2. Rest your left arm so that the cuff is about heart level. If the cuff goes on your upper arm,  then just relax the arm on the table, arm of the chair or your lap. If you have a wrist cuff, we  suggest relaxing your wrist against your chest (think of it as Pledging the Flag with the  wrong arm).  3. Place the cuff snugly around your arm, about 1 inch above the crook of your elbow. The  cords should be inside the groove of your elbow.  4. Sit quietly, with the cuff in place, for about 5 minutes. After that 5 minutes press the power  button to start  a reading. 5. Do not talk or move while the reading is taking place.  6. Record your readings on a sheet of paper. Although most cuffs have a memory, it is often  easier to see a pattern developing when the numbers are all in front of you.  7. You can repeat the reading after 1-3 minutes if it is recommended  Make sure your bladder is empty and you have not had caffeine or tobacco within the last 30 min  Always bring your blood pressure log with you to your appointments. If you have not brought your monitor in to be double checked for accuracy, please bring it to your next appointment.  You can find a list of quality blood pressure cuffs at [employer]     May

## 2022-07-19 NOTE — Assessment & Plan Note (Signed)
Assessment: BP is un/controlled in office BP 174/107 mmHg;  above the goal (<140/80). She is finishing a 9 day course of prednisone (30 mg x 3, 20 mg x 3 10 mg x 3), with the last dose taken this morning Tolerates losartan and metoprolol well without any side effects Denies SOB, palpitation, chest pain, headaches,or swelling Has history of syncopal events, will try to keep pressure under [id number] systolic for now. Reiterated the importance of regular exercise  Patient has history of hyponatremia, will check sodium level again today  Plan:  Stop taking losartan Start taking olmesartan 40 mg daily Continue taking metoprolol succ 25 mg daily Patient to keep record of BP readings with heart rate and report to [place] at the next visit Try to check BP readings after syncopal events if possible.  Need to determine if BP drops are cause of this Patient to follow up with PharmD in 6 weeks for follow up  Labs ordered today:  BMET

## 2022-07-19 NOTE — Patient Instructions (Signed)
Description   Do not take any warfarin today and no warfarin tomorrow then continue taking 1 tablet daily.  Stay consistent with greens each week.  Recheck INR in 2 weeks.  Anticoagulation Clinic [phone]

## 2022-07-19 NOTE — Telephone Encounter (Signed)
Pt called per order from Dr. Lovena Le, ok to STOP taking Toprol XL 25 mg.    Pt will start taking Carvedilol 3.125 mg, BID.   Spoke with Pt and confirmed [facility] in [place].    Pt will pick up and start taking medicine.  Pt advised to call [facility] with concerns, or new / worsening symptoms.  Pt understood.

## 2022-07-19 NOTE — Addendum Note (Signed)
Addended by: Oleta Mouse C on: 07/19/2022 04:17 PM   Modules accepted: Orders

## 2022-07-19 NOTE — Progress Notes (Signed)
Office Visit    Patient Name: Danielle Garrett Date of Encounter: 07/19/2022  Primary Care Provider:  London Pepper, MD Primary Cardiologist:  None  Chief Complaint    Hypertension  Significant Past Medical History   Anginal pain Hospitalized in past for CP 2/2 low potassium  asthma Has prn albuterol MDI  AF Paroxysmal - CHADS2VASc = 3 on warfarin  GERD On pantoprazole    Allergies  Allergen Reactions   Hydrochlorothiazide Nausea Only   Penicillins Other (See Comments)    Unknown Other reaction(s): Other (See Comments) Pt was a child   Adhesive [Tape]     Pt states ' it took my skin off' Steri-Strips   Atorvastatin Calcium     Other reaction(s): insomnia   Codeine Nausea And Vomiting   Sulfa Antibiotics Nausea And Vomiting   Trimethoprim    Vicodin [Hydrocodone-Acetaminophen] Nausea And Vomiting and Other (See Comments)    Did not feel like herself    History of Present Illness    Danielle Garrett is a 72 y.o. female patient of Dr Sallyanne Kuster, referred to [facility] for management of hypertension.  Most recently she was seen by Cammy Copa and found to have a pressure of 185/101, which dropped to 177/106 on re-check.  Patient noted home readings were much lower, although she did not have any specific numbers with her.  She has had problems with hyponatremia in the past and has been advised to keep her salt intake up and limit water to 40 oz per day.     Today she returns, with her home meter and list of readings (see below).  She has been on a course of prednisone for the past week, taking her last dose this morning.  As such, her home pressures have been higher than they normally run, although even before the prednisone, she notes most of the diastolic readings were [id number] and systolic mostly > [id number].   Her current BP goa is < 140/80 because of a syncopal event in 2023.  She also has had hyponatremia and hypokalemia at times, as noted in her chart.  Because of the  hyponatremia she doesn't avoid salt, but she also doesn't purposely add other than some with cooking.   Lastly, she is bothered by a 5 pound weight gain that has occurred in the past 2-3 months.    Blood Pressure Goal:  140/80  Current Medications:  losartan 100 mg qd, metoprolol succ 25 mg qd  Previously tried: hctz caused nausea (hyponatremia)  Family History:  Mother- heart issues - died at 64 -angina Sisters- cancer Oldest sister - heart issues  3 sons healthy   Social History:  Alcohol: none Smoking: never   Diet: increased salt intake - with cooking, but doesn't regularly add at the table anymore; Eats lots of greens as well as chicken, occasional fish. Loves fruits and nuts    Exercise: walking - 60 min per day (~ 6000-8000 steps/day)   Home BP readings: has Omron arm cuff, size appropriate; read within 5 points of office readings   HR this week 49-140 -home meter read 4/1 points off from office reading.    23 readings from home averaged 150/96  (range 123-168/88-106) - only 5 of 23 readings were < [id number] systolic  Adherence Assessment  Do you ever forget to take your medication? [] Yes [x] No  Do you ever skip doses due to side effects? [] Yes [x] No  Do you have trouble affording your medicines? [] Yes [x] No  Are  you ever unable to pick up your medication due to transportation difficulties? [] Yes [x] No  Do you ever stop taking your medications because you don't believe they are helping? [] Yes [x] No      Adherence strategy: has daily routine - "7 day pill minders are for old people"   Accessory Clinical Findings    Lab Results  Component Value Date   CREATININE 0.75 02/14/2022   BUN 12 02/14/2022   NA 136 02/14/2022   K 4.7 02/14/2022   CL 97 02/14/2022   CO2 27 02/14/2022   Lab Results  Component Value Date   ALT 18 11/24/2019   AST 21 11/24/2019   ALKPHOS 87 11/24/2019   BILITOT 0.6 11/24/2019   Lab Results  Component Value Date   HGBA1C (H) 11/08/2009     5.9 (NOTE)                                                                       According to the ADA Clinical Practice Recommendations for 2011, when HbA1c is used as a screening test:   >=6.5%   Diagnostic of Diabetes Mellitus           (if abnormal result  is confirmed)  5.7-6.4%   Increased risk of developing Diabetes Mellitus  References:Diagnosis and Classification of Diabetes Mellitus,Diabetes [id number] 1):S62-S69 and Standards of Medical Care in         Diabetes - 2011,Diabetes Care,2011,34  (Suppl 1):S11-S61.    Home Medications    Current Outpatient Medications  Medication Sig Dispense Refill   olmesartan (BENICAR) 40 MG tablet Take 1 tablet (40 mg total) by mouth daily. 90 tablet 3   albuterol (PROVENTIL HFA;VENTOLIN HFA) 108 (90 Base) MCG/ACT inhaler Inhale 1-2 puffs into the lungs every 4 (four) hours as needed for wheezing or shortness of breath.     fluticasone (FLONASE) 50 MCG/ACT nasal spray Place 2 sprays into both nostrils as needed for allergies or rhinitis.     loratadine (CLARITIN) 10 MG tablet Take 10 mg by mouth daily.     metoprolol succinate (TOPROL XL) 25 MG 24 hr tablet Take 1/2 tablet by mouth daily for 1 week then increase it to 1 tablet by mouth daily 90 tablet 3   Multiple Vitamins-Minerals (CENTRUM SILVER 50+WOMEN PO) Take 1 tablet by mouth daily.     pantoprazole (PROTONIX) 40 MG tablet Take 40 mg by mouth 2 (two) times daily.     rosuvastatin (CRESTOR) 10 MG tablet Take 1 tablet (10 mg total) by mouth daily. 90 tablet 3   sucralfate (CARAFATE) 1 g tablet Take 1 g by mouth 2 (two) times daily as needed (Takes if Pantoprazole is not working).      VITAMIN D PO Take 1 tablet by mouth daily.     warfarin (COUMADIN) 5 MG tablet Take 1 tablet (5 mg total) by mouth as directed. 90 tablet 3   No current facility-administered medications for this visit.     HYPERTENSION CONTROL Vitals:   07/19/22 1013 07/19/22 1016  BP: (!) 171/108 (!) 174/107     The patient's blood pressure is elevated above target today. {Click here if intervention needs to be changed Refresh Note :1}  In order to address the patient's  elevated BP: A current anti-hypertensive medication was adjusted today.     Assessment & Plan    Hypertension Assessment: BP is un/controlled in office BP 174/107 mmHg;  above the goal (<140/80). She is finishing a 9 day course of prednisone (30 mg x 3, 20 mg x 3 10 mg x 3), with the last dose taken this morning Tolerates losartan and metoprolol well without any side effects Denies SOB, palpitation, chest pain, headaches,or swelling Has history of syncopal events, will try to keep pressure under [id number] systolic for now. Reiterated the importance of regular exercise  Patient has history of hyponatremia, will check sodium level again today  Plan:  Stop taking losartan Start taking olmesartan 40 mg daily Continue taking metoprolol succ 25 mg daily Patient to keep record of BP readings with heart rate and report to [place] at the next visit Try to check BP readings after syncopal events if possible.  Need to determine if BP drops are cause of this Patient to follow up with PharmD in 6 weeks for follow up  Labs ordered today:  BMET   Tommy Medal PharmD CPP [facility]  [address] [place], [place] [address] [phone]

## 2022-07-20 ENCOUNTER — Encounter: Payer: Self-pay | Admitting: Pharmacist Clinician (PhC)/ Clinical Pharmacy Specialist

## 2022-07-20 ENCOUNTER — Ambulatory Visit: Payer: Medicare Other

## 2022-07-20 LAB — BASIC METABOLIC PANEL
BUN/Creatinine Ratio: 16 (ref 12–28)
BUN: 14 mg/dL (ref 8–27)
CO2: 26 mmol/L (ref 20–29)
Calcium: 9.8 mg/dL (ref 8.7–10.3)
Chloride: 96 mmol/L (ref 96–106)
Creatinine, Ser: 0.9 mg/dL (ref 0.57–1.00)
Glucose: 77 mg/dL (ref 70–99)
Potassium: 4.2 mmol/L (ref 3.5–5.2)
Sodium: 136 mmol/L (ref 134–144)
eGFR: 68 mL/min/{1.73_m2} (ref >59)

## 2022-07-28 DIAGNOSIS — M79672 Pain in left foot: Secondary | ICD-10-CM | POA: Diagnosis not present

## 2022-07-28 DIAGNOSIS — M25572 Pain in left ankle and joints of left foot: Secondary | ICD-10-CM | POA: Diagnosis not present

## 2022-08-02 ENCOUNTER — Ambulatory Visit: Payer: Medicare Other | Attending: Internal Medicine | Admitting: Pharmacist

## 2022-08-02 DIAGNOSIS — Z5181 Encounter for therapeutic drug level monitoring: Secondary | ICD-10-CM | POA: Diagnosis not present

## 2022-08-02 DIAGNOSIS — M79672 Pain in left foot: Secondary | ICD-10-CM | POA: Diagnosis not present

## 2022-08-02 DIAGNOSIS — I4891 Unspecified atrial fibrillation: Secondary | ICD-10-CM | POA: Diagnosis not present

## 2022-08-02 LAB — POCT INR: INR: 2.9 (ref 2.0–3.0)

## 2022-08-02 NOTE — Patient Instructions (Signed)
Description   Continue taking 1 tablet daily.  Stay consistent with greens each week.  Recheck INR in 4 weeks.  Anticoagulation Clinic [phone]

## 2022-08-11 ENCOUNTER — Ambulatory Visit: Payer: Medicare Other | Admitting: Cardiovascular Disease

## 2022-08-11 DIAGNOSIS — T8484XA Pain due to internal orthopedic prosthetic devices, implants and grafts, initial encounter: Secondary | ICD-10-CM | POA: Diagnosis not present

## 2022-08-30 ENCOUNTER — Ambulatory Visit: Payer: Medicare Other | Attending: Cardiovascular Disease | Admitting: *Deleted

## 2022-08-30 DIAGNOSIS — Z5181 Encounter for therapeutic drug level monitoring: Secondary | ICD-10-CM | POA: Diagnosis not present

## 2022-08-30 DIAGNOSIS — I4891 Unspecified atrial fibrillation: Secondary | ICD-10-CM | POA: Insufficient documentation

## 2022-08-30 LAB — POCT INR: INR: 1.9 — AB (ref 2.0–3.0)

## 2022-08-30 NOTE — Patient Instructions (Signed)
Description   Today take 1.5 tablets of warfarin then continue taking 1 tablet daily.  Stay consistent with greens each week.  Recheck INR in 4 weeks.  Anticoagulation Clinic [phone]

## 2022-09-04 ENCOUNTER — Ambulatory Visit
Payer: Medicare Other | Attending: Cardiovascular Disease | Admitting: Pharmacist Clinician (PhC)/ Clinical Pharmacy Specialist

## 2022-09-04 VITALS — BP 167/98 | HR 76

## 2022-09-04 DIAGNOSIS — I10 Essential (primary) hypertension: Secondary | ICD-10-CM | POA: Insufficient documentation

## 2022-09-04 NOTE — Progress Notes (Signed)
Office Visit    Patient Name: Danielle Garrett Date of Encounter: 09/04/2022  Primary Care Provider:  Farris Has, MD Primary Cardiologist:  Thurmon Fair (1st visit in July, previously saw Dr. Katrinka Blazing)  Chief Complaint    Hypertension  Significant Past Medical History   Anginal pain Hospitalized in past for CP 2/2 low potassium  asthma Has prn albuterol MDI  AF Paroxysmal - CHADS2VASc = 3 on warfarin  GERD On pantoprazole    Allergies  Allergen Reactions   Hydrochlorothiazide Nausea Only   Penicillins Other (See Comments)    Unknown Other reaction(s): Other (See Comments) Pt was a child   Adhesive [Tape]     Pt states ' it took my skin off' Steri-Strips   Atorvastatin Calcium     Other reaction(s): insomnia   Codeine Nausea And Vomiting   Sulfa Antibiotics Nausea And Vomiting   Trimethoprim    Vicodin [Hydrocodone-Acetaminophen] Nausea And Vomiting and Other (See Comments)    Did not feel like herself    History of Present Illness    Danielle Garrett is a 72 y.o. female patient of Dr Royann Shivers, referred to [facility] for management of hypertension.  Most recently she was seen by Carmela Hurt and found to have a pressure of 185/101, which dropped to 177/106 on re-check.  Patient noted home readings were much lower, although she did not have any specific numbers with her.  She has had problems with hyponatremia in the past and has been advised to keep her salt intake up and limit water to 40 oz per day.   At her last visit we switched losartan to olmesartan 40 mg daily.  Currently I have her systolic goal at 161, due to a history of syncope.  She was also on a prednisone taper last month, but had noted increased pressures at home prior to starting the medication.  Home meter was verified to within 5 points.   Today she returns for follow up.  Home BP readings have improved with medication changes made at last visit (losartan to olmesartan, metoprolol succ to carvedilol).   Other than one episode of vertigo in the middle of the night, she has felt fine.    Blood Pressure Goal:  140/80  Current Medications:  losartan 100 mg qd, metoprolol succ 25 mg qd  Previously tried: hctz caused nausea (hyponatremia)  Family History:  Mother- heart issues - died at 15 -angina Sisters- cancer Oldest sister - heart issues  3 sons healthy   Social History:  Alcohol: none Smoking: never   Diet: increased salt intake - with cooking, but doesn't regularly add at the table anymore; Eats lots of greens as well as chicken, occasional fish. Loves fruits and nuts    Exercise: walking - 60 min per day (~ 6000-8000 steps/day)   Home BP readings: has Omron arm cuff, size appropriate; read within 5 points of office readings    9 readings over past month, average 125/85, improved from last visit average of 150/96      Adherence Assessment  Do you ever forget to take your medication? [] Yes [x] No  Do you ever skip doses due to side effects? [] Yes [x] No  Do you have trouble affording your medicines? [] Yes [x] No  Are you ever unable to pick up your medication due to transportation difficulties? [] Yes [x] No  Do you ever stop taking your medications because you don't believe they are helping? [] Yes [x] No      Adherence strategy: has daily routine - "  7 day pill minders are for old people"   Accessory Clinical Findings    Lab Results  Component Value Date   CREATININE 0.90 07/19/2022   BUN 14 07/19/2022   NA 136 07/19/2022   K 4.2 07/19/2022   CL 96 07/19/2022   CO2 26 07/19/2022   Lab Results  Component Value Date   ALT 18 11/24/2019   AST 21 11/24/2019   ALKPHOS 87 11/24/2019   BILITOT 0.6 11/24/2019   Lab Results  Component Value Date   HGBA1C (H) 11/08/2009    5.9 (NOTE)                                                                       According to the ADA Clinical Practice Recommendations for 2011, when HbA1c is used as a screening test:   >=6.5%    Diagnostic of Diabetes Mellitus           (if abnormal result  is confirmed)  5.7-6.4%   Increased risk of developing Diabetes Mellitus  References:Diagnosis and Classification of Diabetes Mellitus,Diabetes Care,2011,34(Suppl 1):S62-S69 and Standards of Medical Care in         Diabetes - 2011,Diabetes Care,2011,34  (Suppl 1):S11-S61.    Home Medications    Current Outpatient Medications  Medication Sig Dispense Refill   carvedilol (COREG) 3.125 MG tablet Take 1 tablet (3.125 mg total) by mouth 2 (two) times daily. 90 tablet 3   albuterol (PROVENTIL HFA;VENTOLIN HFA) 108 (90 Base) MCG/ACT inhaler Inhale 1-2 puffs into the lungs every 4 (four) hours as needed for wheezing or shortness of breath.     fluticasone (FLONASE) 50 MCG/ACT nasal spray Place 2 sprays into both nostrils as needed for allergies or rhinitis.     loratadine (CLARITIN) 10 MG tablet Take 10 mg by mouth daily.     Multiple Vitamins-Minerals (CENTRUM SILVER 50+WOMEN PO) Take 1 tablet by mouth daily.     olmesartan (BENICAR) 40 MG tablet Take 1 tablet (40 mg total) by mouth daily. 90 tablet 3   pantoprazole (PROTONIX) 40 MG tablet Take 40 mg by mouth 2 (two) times daily.     rosuvastatin (CRESTOR) 10 MG tablet Take 1 tablet (10 mg total) by mouth daily. 90 tablet 3   sucralfate (CARAFATE) 1 g tablet Take 1 g by mouth 2 (two) times daily as needed (Takes if Pantoprazole is not working).      VITAMIN D PO Take 1 tablet by mouth daily.     warfarin (COUMADIN) 5 MG tablet Take 1 tablet (5 mg total) by mouth as directed. 90 tablet 3   No current facility-administered medications for this visit.     HYPERTENSION CONTROL Vitals:   09/04/22 0938 09/04/22 0942  BP: (!) 192/96 (!) 167/98    The patient's blood pressure is elevated above target today.  In order to address the patient's elevated BP:      Assessment & Plan    Hypertension Assessment: BP is uncontrolled in office BP 167/98 mmHg;  above the goal (<140/80). Home  readings (with accurate cuff) average 125/85 Home average dropped 25/11 points with switch from losartan to olmesartan, metoprolol to carvedilol Tolerates olmesartan and carvedilol well without any side effects Denies SOB, palpitation, chest pain,  headaches,or swelling Reiterated the importance of regular exercise and low salt diet   Plan:  Continue taking olmesartan and carvedilol Patient to keep record of BP readings (twice daily) with heart rate and report to [place] at the next visit Patient to follow up with me in 6 weeks  Labs ordered today:  none   Phillips Hay PharmD CPP [facility]  [address] [place], [place] [address] [phone]

## 2022-09-04 NOTE — Assessment & Plan Note (Signed)
Assessment: BP is uncontrolled in office BP 167/98 mmHg;  above the goal (<140/80). Home readings (with accurate cuff) average 125/85 Home average dropped 25/11 points with switch from losartan to olmesartan, metoprolol to carvedilol Tolerates olmesartan and carvedilol well without any side effects Denies SOB, palpitation, chest pain, headaches,or swelling Reiterated the importance of regular exercise and low salt diet   Plan:  Continue taking olmesartan and carvedilol Patient to keep record of BP readings (twice daily) with heart rate and report to [place] at the next visit Patient to follow up with me in 6 weeks  Labs ordered today:  none

## 2022-09-04 NOTE — Patient Instructions (Signed)
Follow up appointment: Monday June 24 at 9:30 am  Take your BP meds as follows: continue with your current medications  Check your blood pressure at home twice daily - at least 3 days per week and keep record of the readings.  Hypertension "High blood pressure"  Hypertension is often called "The Silent Killer." It rarely causes symptoms until it is extremely  high or has done damage to other organs in the body. For this reason, you should have your  blood pressure checked regularly by your physician. We will check your blood pressure  every time you see a provider at one of our offices.   Your blood pressure reading consists of two numbers. Ideally, blood pressure should be  below 120/80. The first ("top") number is called the systolic pressure. It measures the  pressure in your arteries as your heart beats. The second ("bottom") number is called the diastolic pressure. It measures the pressure in your arteries as the heart relaxes between beats.  The benefits of getting your blood pressure under control are enormous. A 10-point  reduction in systolic blood pressure can reduce your risk of stroke by 27% and heart failure by 28%  Your blood pressure goal is < 130/80  To check your pressure at home you will need to:  1. Sit up in a chair, with feet flat on the floor and back supported. Do not cross your ankles or legs. 2. Rest your left arm so that the cuff is about heart level. If the cuff goes on your upper arm,  then just relax the arm on the table, arm of the chair or your lap. If you have a wrist cuff, we  suggest relaxing your wrist against your chest (think of it as Pledging the Flag with the  wrong arm).  3. Place the cuff snugly around your arm, about 1 inch above the crook of your elbow. The  cords should be inside the groove of your elbow.  4. Sit quietly, with the cuff in place, for about 5 minutes. After that 5 minutes press the power  button to start a reading. 5. Do  not talk or move while the reading is taking place.  6. Record your readings on a sheet of paper. Although most cuffs have a memory, it is often  easier to see a pattern developing when the numbers are all in front of you.  7. You can repeat the reading after 1-3 minutes if it is recommended  Make sure your bladder is empty and you have not had caffeine or tobacco within the last 30 min  Always bring your blood pressure log with you to your appointments. If you have not brought your monitor in to be double checked for accuracy, please bring it to your next appointment.  You can find a list of quality blood pressure cuffs at validatebp.org

## 2022-09-13 DIAGNOSIS — E538 Deficiency of other specified B group vitamins: Secondary | ICD-10-CM | POA: Diagnosis not present

## 2022-09-13 DIAGNOSIS — E871 Hypo-osmolality and hyponatremia: Secondary | ICD-10-CM | POA: Diagnosis not present

## 2022-09-13 DIAGNOSIS — I480 Paroxysmal atrial fibrillation: Secondary | ICD-10-CM | POA: Diagnosis not present

## 2022-09-13 DIAGNOSIS — M810 Age-related osteoporosis without current pathological fracture: Secondary | ICD-10-CM | POA: Diagnosis not present

## 2022-09-13 DIAGNOSIS — E559 Vitamin D deficiency, unspecified: Secondary | ICD-10-CM | POA: Diagnosis not present

## 2022-09-14 DIAGNOSIS — H9312 Tinnitus, left ear: Secondary | ICD-10-CM | POA: Diagnosis not present

## 2022-09-14 DIAGNOSIS — H903 Sensorineural hearing loss, bilateral: Secondary | ICD-10-CM | POA: Diagnosis not present

## 2022-09-18 DIAGNOSIS — G909 Disorder of the autonomic nervous system, unspecified: Secondary | ICD-10-CM | POA: Diagnosis not present

## 2022-09-18 DIAGNOSIS — M810 Age-related osteoporosis without current pathological fracture: Secondary | ICD-10-CM | POA: Diagnosis not present

## 2022-09-18 DIAGNOSIS — R7309 Other abnormal glucose: Secondary | ICD-10-CM | POA: Diagnosis not present

## 2022-09-18 DIAGNOSIS — I4891 Unspecified atrial fibrillation: Secondary | ICD-10-CM | POA: Diagnosis not present

## 2022-09-18 DIAGNOSIS — G629 Polyneuropathy, unspecified: Secondary | ICD-10-CM | POA: Diagnosis not present

## 2022-09-18 DIAGNOSIS — R635 Abnormal weight gain: Secondary | ICD-10-CM | POA: Diagnosis not present

## 2022-09-22 ENCOUNTER — Encounter: Payer: Self-pay | Admitting: Pharmacist Clinician (PhC)/ Clinical Pharmacy Specialist

## 2022-09-25 ENCOUNTER — Other Ambulatory Visit: Payer: Self-pay

## 2022-09-25 ENCOUNTER — Observation Stay (HOSPITAL_COMMUNITY)
Admission: EM | Admit: 2022-09-25 | Discharge: 2022-09-26 | Disposition: A | Discharge status: Home / Self Care | Payer: Medicare Other | Attending: Internal Medicine | Admitting: Internal Medicine

## 2022-09-25 ENCOUNTER — Emergency Department (HOSPITAL_COMMUNITY): Payer: Medicare Other

## 2022-09-25 DIAGNOSIS — M810 Age-related osteoporosis without current pathological fracture: Secondary | ICD-10-CM | POA: Diagnosis not present

## 2022-09-25 DIAGNOSIS — R0789 Other chest pain: Principal | ICD-10-CM | POA: Insufficient documentation

## 2022-09-25 DIAGNOSIS — Z950 Presence of cardiac pacemaker: Secondary | ICD-10-CM | POA: Diagnosis not present

## 2022-09-25 DIAGNOSIS — R000 Tachycardia, unspecified: Secondary | ICD-10-CM | POA: Diagnosis not present

## 2022-09-25 DIAGNOSIS — G909 Disorder of the autonomic nervous system, unspecified: Secondary | ICD-10-CM | POA: Diagnosis not present

## 2022-09-25 DIAGNOSIS — I160 Hypertensive urgency: Secondary | ICD-10-CM

## 2022-09-25 DIAGNOSIS — I161 Hypertensive emergency: Secondary | ICD-10-CM | POA: Diagnosis not present

## 2022-09-25 DIAGNOSIS — Z79899 Other long term (current) drug therapy: Secondary | ICD-10-CM | POA: Diagnosis not present

## 2022-09-25 DIAGNOSIS — I10 Essential (primary) hypertension: Secondary | ICD-10-CM | POA: Diagnosis not present

## 2022-09-25 DIAGNOSIS — Z981 Arthrodesis status: Secondary | ICD-10-CM | POA: Diagnosis not present

## 2022-09-25 DIAGNOSIS — I2129 ST elevation (STEMI) myocardial infarction involving other sites: Secondary | ICD-10-CM | POA: Diagnosis not present

## 2022-09-25 DIAGNOSIS — Z7901 Long term (current) use of anticoagulants: Secondary | ICD-10-CM | POA: Insufficient documentation

## 2022-09-25 DIAGNOSIS — I447 Left bundle-branch block, unspecified: Secondary | ICD-10-CM | POA: Diagnosis not present

## 2022-09-25 DIAGNOSIS — J45909 Unspecified asthma, uncomplicated: Secondary | ICD-10-CM | POA: Insufficient documentation

## 2022-09-25 DIAGNOSIS — I2510 Atherosclerotic heart disease of native coronary artery without angina pectoris: Secondary | ICD-10-CM | POA: Insufficient documentation

## 2022-09-25 DIAGNOSIS — I480 Paroxysmal atrial fibrillation: Secondary | ICD-10-CM | POA: Diagnosis not present

## 2022-09-25 DIAGNOSIS — Z96641 Presence of right artificial hip joint: Secondary | ICD-10-CM | POA: Insufficient documentation

## 2022-09-25 DIAGNOSIS — R079 Chest pain, unspecified: Secondary | ICD-10-CM | POA: Diagnosis not present

## 2022-09-25 LAB — BASIC METABOLIC PANEL
Anion gap: 14 (ref 5–15)
BUN: 10 mg/dL (ref 8–23)
CO2: 24 mmol/L (ref 22–32)
Calcium: 9.4 mg/dL (ref 8.9–10.3)
Chloride: 96 mmol/L — ABNORMAL LOW (ref 98–111)
Creatinine, Ser: 0.87 mg/dL (ref 0.44–1.00)
GFR, Estimated: >60 mL/min (ref >60)
Glucose, Bld: 97 mg/dL (ref 70–99)
Potassium: 3.9 mmol/L (ref 3.5–5.1)
Sodium: 134 mmol/L — ABNORMAL LOW (ref 135–145)

## 2022-09-25 LAB — PROTIME-INR
INR: 2.2 — ABNORMAL HIGH (ref 0.8–1.2)
Prothrombin Time: 24.6 seconds — ABNORMAL HIGH (ref 11.4–15.2)

## 2022-09-25 LAB — CBC
HCT: 40.9 % (ref 36.0–46.0)
Hemoglobin: 13.7 g/dL (ref 12.0–15.0)
MCH: 31.1 pg (ref 26.0–34.0)
MCHC: 33.5 g/dL (ref 30.0–36.0)
MCV: 92.7 fL (ref 80.0–100.0)
Platelets: 246 10*3/uL (ref 150–400)
RBC: 4.41 MIL/uL (ref 3.87–5.11)
RDW: 13 % (ref 11.5–15.5)
WBC: 5.6 10*3/uL (ref 4.0–10.5)
nRBC: 0 % (ref 0.0–0.2)

## 2022-09-25 LAB — TROPONIN I (HIGH SENSITIVITY)
Troponin I (High Sensitivity): 4 ng/L (ref <18)
Troponin I (High Sensitivity): 4 ng/L (ref <18)

## 2022-09-25 MED ORDER — ROSUVASTATIN CALCIUM 5 MG PO TABS
10.0000 mg | ORAL_TABLET | Freq: Every day | ORAL | Status: DC
  Administered 2022-09-26: 10 mg via ORAL
  Filled 2022-09-25: qty 2

## 2022-09-25 MED ORDER — WARFARIN SODIUM 5 MG PO TABS
5.0000 mg | ORAL_TABLET | Freq: Once | ORAL | Status: DC
  Filled 2022-09-25: qty 1

## 2022-09-25 MED ORDER — LABETALOL HCL 5 MG/ML IV SOLN: 5.0000 mg | INTRAVENOUS | Status: DC | PRN

## 2022-09-25 MED ORDER — WARFARIN - PHARMACIST DOSING INPATIENT: Freq: Every day | Status: DC

## 2022-09-25 MED ORDER — ACETAMINOPHEN 325 MG PO TABS: 650.0000 mg | ORAL_TABLET | ORAL | Status: DC | PRN

## 2022-09-25 MED ORDER — CARVEDILOL 3.125 MG PO TABS
6.2500 mg | ORAL_TABLET | Freq: Two times a day (BID) | ORAL | Status: DC
  Administered 2022-09-25 – 2022-09-26 (×2): 6.25 mg via ORAL
  Filled 2022-09-25 (×2): qty 2

## 2022-09-25 MED ORDER — CARVEDILOL 3.125 MG PO TABS: 6.2500 mg | ORAL_TABLET | Freq: Two times a day (BID) | ORAL | Status: DC

## 2022-09-25 MED ORDER — IRBESARTAN 300 MG PO TABS
300.0000 mg | ORAL_TABLET | Freq: Every day | ORAL | Status: DC
  Administered 2022-09-26: 300 mg via ORAL
  Filled 2022-09-25: qty 1

## 2022-09-25 MED ORDER — CARVEDILOL 6.25 MG PO TABS: 6.2500 mg | ORAL_TABLET | Freq: Two times a day (BID) | ORAL | 3 refills | Status: DC

## 2022-09-25 MED ORDER — LABETALOL HCL 5 MG/ML IV SOLN
10.0000 mg | Freq: Once | INTRAVENOUS | Status: AC
  Administered 2022-09-25: 10 mg via INTRAVENOUS
  Filled 2022-09-25: qty 4

## 2022-09-25 MED ORDER — ONDANSETRON HCL 4 MG/2ML IJ SOLN: 4.0000 mg | Freq: Four times a day (QID) | INTRAMUSCULAR | Status: DC | PRN

## 2022-09-25 NOTE — ED Provider Notes (Signed)
[place] EMERGENCY DEPARTMENT AT [facility] Provider Note   CSN: [id number] Arrival date & time: 09/25/22  1257     History {Add pertinent medical, surgical, social history, OB history to HPI:1} Chief Complaint  Patient presents with   Chest Pain    Danielle Garrett is a 72 y.o. female with afib on warfarin, HTN, neurally-mediated syncope who presents with chest pain.   Chest pressure, tightness, and intermittent sharp pains that started this morning. Was just sitting down when it started. Went to UC and told she may be having a heart attack, and advised she go to ED by EMS. BP 196/133. EMS gave nitroglycerin at 1300 pm and ASA, and discomfort improved. Never had chest pain like this before. "Dullness" and "pressure" has returned but sharp pain has not, now rated 2/10. Denies nausea/vomiting, sweating. Non-radiating. No numbness/tingling, recent illnesses, cough, fevers/chills.   Per chart review in 02/2022 had CT coronary that showed  1. Mid D1 stenosis of borderline hemodynamic significance.  Last saw cardiology in 03/2022 and had nonobstructive CAD, had no angina at the time and so was instructed to stay on med regimen.   She states her blood pressure is always high, typically in the 160s at home.    Chest Pain      Home Medications Prior to Admission medications   Medication Sig Start Date End Date Taking? Authorizing Provider  albuterol (PROVENTIL HFA;VENTOLIN HFA) 108 (90 Base) MCG/ACT inhaler Inhale 1-2 puffs into the lungs every 4 (four) hours as needed for wheezing or shortness of breath.    [provider]  carvedilol (COREG) 6.25 MG tablet Take 1 tablet (6.25 mg total) by mouth 2 (two) times daily. 09/25/22   Croitoru, Mihai, MD  fluticasone (FLONASE) 50 MCG/ACT nasal spray Place 2 sprays into both nostrils as needed for allergies or rhinitis.    [provider]  loratadine (CLARITIN) 10 MG tablet Take 10 mg by mouth daily.    [provider]  Multiple Vitamins-Minerals (CENTRUM SILVER 50+WOMEN PO) Take 1 tablet by mouth daily.    [provider]  olmesartan (BENICAR) 40 MG tablet Take 1 tablet (40 mg total) by mouth daily. 07/19/22   Croitoru, Mihai, MD  pantoprazole (PROTONIX) 40 MG tablet Take 40 mg by mouth 2 (two) times daily.    [provider]  rosuvastatin (CRESTOR) 10 MG tablet Take 1 tablet (10 mg total) by mouth daily. 03/13/22   Swinyer, Zachary George, NP  sucralfate (CARAFATE) 1 g tablet Take 1 g by mouth 2 (two) times daily as needed (Takes if Pantoprazole is not working).     [provider]  VITAMIN D PO Take 1 tablet by mouth daily.    [provider]  warfarin (COUMADIN) 5 MG tablet Take 1 tablet (5 mg total) by mouth as directed. 03/30/22   Marinus Maw, MD      Allergies    Hydrochlorothiazide, Penicillins, Adhesive [tape], Atorvastatin calcium, Codeine, Sulfa antibiotics, Trimethoprim, and Vicodin [hydrocodone-acetaminophen]    Review of Systems   Review of Systems  Cardiovascular:  Positive for chest pain.   Review of systems {pos/neg:18640::"Negative","Positive"} for ***.  A 10 point review of systems was performed and is negative unless otherwise reported in HPI.  Physical Exam Updated Vital Signs BP (!) 172/88 (BP Location: Left Arm)   Pulse 72   Temp 97.9 F (36.6 C)   Resp 17   Ht 5' (1.524 m)   Wt 54.4 kg  SpO2 100%   BMI 23.44 kg/m  Physical Exam General: Normal appearing female, lying in bed.  HEENT: PERRLA, Sclera anicteric, MMM, trachea midline.  Cardiology: RRR, no murmurs/rubs/gallops. BL radial and DP pulses equal bilaterally.  Resp: Normal respiratory rate and effort. CTAB, no wheezes, rhonchi, crackles.  Abd: Soft, non-tender, non-distended. No rebound tenderness or guarding.  GU: Deferred. MSK: No peripheral edema or signs of trauma. Extremities without deformity or TTP. No cyanosis or clubbing. Skin: warm, dry. No rashes or  lesions. Back: No CVA tenderness Neuro: A&Ox4, CNs II-XII grossly intact. MAEs. Sensation grossly intact.  Psych: Normal mood and affect.   ED Results / Procedures / Treatments   Labs (all labs ordered are listed, but only abnormal results are displayed) Labs Reviewed  BASIC METABOLIC PANEL - Abnormal; Notable for the following components:      Result Value   Sodium 134 (*)    Chloride 96 (*)    All other components within normal limits  CBC  TROPONIN I (HIGH SENSITIVITY)  TROPONIN I (HIGH SENSITIVITY)    EKG EKG Interpretation  Date/Time:  Monday September 25 2022 18:13:41 EDT Ventricular Rate:  73 PR Interval:    QRS Duration: 125 QT Interval:  458 QTC Calculation: 505 R Axis:   -39 Text Interpretation: Atrial fibrillation Left bundle branch block Confirmed by Vivi Barrack [phone]) on 09/25/2022 7:04:57 PM  Radiology DG Chest 2 View  Result Date: 09/25/2022 CLINICAL DATA:  Provided history: Chest pain. EXAM: CHEST - 2 VIEW COMPARISON:  Prior chest radiographs 03/15/2016 and earlier. FINDINGS: Heart size within normal limits. Aortic atherosclerosis. Biapical pleuroparenchymal scarring. No appreciable airspace consolidation or pulmonary edema. No evidence of pleural effusion or pneumothorax. No acute osseous abnormality identified. Thoracic spondylosis. Mild levocurvature of the lower thoracic spine. IMPRESSION: 1.  No evidence of an acute cardiopulmonary abnormality. 2.  Aortic Atherosclerosis (ICD10-I70.0). Electronically Signed   By: Jackey Loge D.O.   On: 09/25/2022 14:14    Procedures Procedures  {Document cardiac monitor, telemetry assessment procedure when appropriate:1}  Medications Ordered in ED Medications - No data to display  ED Course/ Medical Decision Making/ A&P                          Medical Decision Making Amount and/or Complexity of Data Reviewed Labs: ordered. Decision-making details documented in ED Course. Radiology: ordered. Decision-making details  documented in ED Course.    This patient presents to the ED for concern of ***, this involves an extensive number of treatment options, and is a complaint that carries with it a high risk of complications and morbidity.  I considered the following differential and admission for this acute, potentially life threatening condition.   MDM:    DDX for chest pain includes but is not limited to:  ACS/arrhythmia,  PE, aortic dissection, PNA, PTX, esophogeal rupture, biliary disease, cardiac tamponade, pericarditis, GERD/PUD/gastritis, or musculoskeletal pain. Very low suspicion for ACS vs aortic dissection given presenting sx. Patient cannot PERC out based on tachycardia, but will obtain D dimer and reassess, minimal risk factors for PE. No c/f dissection. No abdominal pain and no c/f biliary disease. ***  Also consider hypertensive emergency. Has recently been managing blood pressure per chart review, increased carvedilol to 6.25 BID and has olmesartan as well. Will give home dose of carvedilol now which is due.  HEART score is 5, moderate risk. Will engage with cardiology.   Clinical Course as of 09/25/22 1917  Mon Sep 25, 2022  1905 Troponin I (High Sensitivity): 4 4-4 flat [HN]  1905 Basic metabolic panel(!) Unremarkable in the context of this patient's presentation  [HN]  1905 CBC wnl [HN]  1905 DG Chest 2 View 1.  No evidence of an acute cardiopulmonary abnormality. 2.  Aortic Atherosclerosis (ICD10-I70.0).   [HN]    Clinical Course User Index [HN] Loetta Rough, MD    Labs: I Ordered, and personally interpreted labs.  The pertinent results include:  those listed above  Imaging Studies ordered: I ordered imaging studies including CXR I independently visualized and interpreted imaging. I agree with the radiologist interpretation  Additional history obtained from chart review, husband at bedside.   Cardiac Monitoring: The patient was maintained on a cardiac monitor.  I  personally viewed and interpreted the cardiac monitored which showed an underlying rhythm of: NSR w/ LBBB which was pre-existing  Reevaluation: After the interventions noted above, I reevaluated the patient and found that they have :improved  Social Determinants of Health: Patient lives independently  Disposition:  ***  Co morbidities that complicate the patient evaluation  Past Medical History:  Diagnosis Date   Allergic rhinitis due to other allergen    Anginal pain (HCC) 2011   hospitalized for CP in the past; due to low potassium   Anxiety    Arthritis    Childhood asthma    Chronic hoarseness    Gastroparesis    GERD (gastroesophageal reflux disease)    History of hiatal hernia    Hx of blood transfusion reaction    in the early 80s   Hypertension    Left bundle branch block     (abnormal heart rhythm)   PONV (postoperative nausea and vomiting)    Pyloric stenosis      Medicines No orders of the defined types were placed in this encounter.   I have reviewed the patients home medicines and have made adjustments as needed  Problem List / ED Course: Problem List Items Addressed This Visit   None        {Document critical care time when appropriate:1} {Document review of labs and clinical decision tools ie heart score, Chads2Vasc2 etc:1}  {Document your independent review of radiology images, and any outside records:1} {Document your discussion with family members, caretakers, and with consultants:1} {Document social determinants of health affecting pt's care:1} {Document your decision making why or why not admission, treatments were needed:1}  This note was created using dictation software, which may contain spelling or grammatical errors.

## 2022-09-25 NOTE — Progress Notes (Signed)
ANTICOAGULATION CONSULT NOTE - Initial Consult  Pharmacy Consult for warfarin Indication: atrial fibrillation  Allergies  Allergen Reactions   Hydrochlorothiazide Nausea Only   Penicillins Other (See Comments)    Unknown Other reaction(s): Other (See Comments) Pt was a child   Adhesive [Tape]     Pt states ' it took my skin off' Steri-Strips   Atorvastatin Calcium     Other reaction(s): insomnia   Codeine Nausea And Vomiting   Sulfa Antibiotics Nausea And Vomiting   Trimethoprim    Vicodin [Hydrocodone-Acetaminophen] Nausea And Vomiting and Other (See Comments)    Did not feel like herself    Patient Measurements: Height: 5' (152.4 cm) Weight: 54.4 kg (120 lb) IBW/kg (Calculated) : 45.5  Vital Signs: Temp: 98.1 F (36.7 C) (06/10 2114) Temp Source: Oral (06/10 2114) BP: 160/102 (06/10 2130) Pulse Rate: 74 (06/10 2130)  Labs: Recent Labs    09/25/22 1308 09/25/22 1600  HGB 13.7  --   HCT 40.9  --   PLT 246  --   CREATININE 0.87  --   TROPONINIHS 4 4    Estimated Creatinine Clearance: 42 mL/min (by C-G formula based on SCr of 0.87 mg/dL).   Medical History: Past Medical History:  Diagnosis Date   Allergic rhinitis due to other allergen    Anginal pain (HCC) 2011   hospitalized for CP in the past; due to low potassium   Anxiety    Arthritis    Childhood asthma    Chronic hoarseness    Gastroparesis    GERD (gastroesophageal reflux disease)    History of hiatal hernia    Hx of blood transfusion reaction    in the early 80s   Hypertension    Left bundle branch block     (abnormal heart rhythm)   PONV (postoperative nausea and vomiting)    Pyloric stenosis     Medications:  (Not in a hospital admission)  Scheduled:   carvedilol  6.25 mg Oral BID   [START ON 09/26/2022] irbesartan  300 mg Oral Daily   [START ON 09/26/2022] rosuvastatin  10 mg Oral Daily   Assessment: Danielle Garrett with a history of AF on warfarin, HTN, syncope. Patient is presenting  with chest pain. Warfarin per pharmacy consult placed for atrial fibrillation.   Patient taking warfarin prior to arrival. Home dose is 5 mg PO daily per patient. Last taken 6/10 AM.   No notable interacting medications   PT / INR today is 24.6 / 2.2, which is therapeutic Hgb 13.7; plt 246  Goal of Therapy:  INR 2-3 Monitor platelets by anticoagulation protocol: Yes   Plan:  Warfarin 5 mg x1 dose starting 6/11 Monitor for s/s of hemorrhage, daily INR, CBC Watch for new DDIs  Arabella Merles, PharmD. [facility] Acute Care PGY-1 09/25/2022 9:49 PM

## 2022-09-25 NOTE — Assessment & Plan Note (Signed)
CP today most likely HTN urgency secondary to BP 190/130 with EMS.  CP resolved after NTG (and BP improved). Trop neg x2 CTA coronaries in Nov was mostly unimpressive, only showing: Mid D1 stenosis of borderline hemodynamic significance  Thus I'm doubtful that this represents ACS Complete resolution of symptoms after BP improvement also not really c/w acute aortic syndrome. CP obs pathway Will let pt eat Control BP with labetalol PRN tonight Cont home coreg and ARB Sending message to cards so they can weigh in if needed.

## 2022-09-25 NOTE — Assessment & Plan Note (Signed)
Due to h/o autonomic dysfunction with episodes of both HYPERtension and HYPOtension including syncope and injury with the latter, Will defer any permanent changes to patients HTN meds to cardiology, sounds like Dr. Ladona Ridgel has been adjusting her meds in office recently to try and get this controlled. Will send message to cards Cardell Peach and include Dr. Ladona Ridgel), so they can at least weigh in on plan in AM and communicate with day team.

## 2022-09-25 NOTE — ED Triage Notes (Signed)
Pt from UC for eval of four hours of constant dull chest  chest pressure underneath L breast with occasional stabbing pain in same place. 324 ASA and 1 nitro given by EMS with some improvement of pain.

## 2022-09-25 NOTE — Assessment & Plan Note (Signed)
Cont coreg 6.25mg  BID Cont coumadin per pharm

## 2022-09-25 NOTE — H&P (Signed)
History and Physical    Patient: Danielle Garrett WUJ:[id number] DOB: Jul 23, 1950 DOA: 09/25/2022 DOS: the patient was seen and examined on 09/25/2022 PCP: Farris Has, MD  Patient coming from: Home  Chief Complaint:  Chief Complaint  Patient presents with   Chest Pain   HPI: Danielle Garrett is a 72 y.o. female with medical history significant of HTN, a.fib on coumadin.  Pt with history of labile BPs including both HYPERtension as well as episodic HYPOtension when BP meds increased to try and treat.  Has had syncope due to HYPOtension with ankle injury in past.  Dr. Ladona Ridgel has been adjusting her BP meds for several months now in the office.  In to ED today with c/o CP, dull under L breast.  Initial BP 196/133 with EMS.  Improved after NTG by EMS.  CP resolved at this time.  Per chart review in 02/2022 had CT coronary that showed  1. Mid D1 stenosis of borderline hemodynamic significance.  Last saw cardiology in 03/2022 and had nonobstructive CAD, had no angina at the time and so was instructed to stay on med regimen.   BP usually 160s at home.  Review of Systems: As mentioned in the history of present illness. All other systems reviewed and are negative. Past Medical History:  Diagnosis Date   Allergic rhinitis due to other allergen    Anginal pain (HCC) 2011   hospitalized for CP in the past; due to low potassium   Anxiety    Arthritis    Childhood asthma    Chronic hoarseness    Gastroparesis    GERD (gastroesophageal reflux disease)    History of hiatal hernia    Hx of blood transfusion reaction    in the early 80s   Hypertension    Left bundle branch block     (abnormal heart rhythm)   PONV (postoperative nausea and vomiting)    Pyloric stenosis    Past Surgical History:  Procedure Laterality Date   ABDOMINAL HYSTERECTOMY  1983   CARDIAC CATHETERIZATION  10/2009   Cardiac cath normal coronary arteries   CERVICAL DISC ARTHROPLASTY     disc replacement in  neck 1998   COLONOSCOPY     10 year repeat 09/26/2010   ESOPHAGOGASTRODUODENOSCOPY  09/26/2010,01/08/13   LOOP RECORDER INSERTION N/A 05/29/2016   Procedure: Loop Recorder Insertion;  Surgeon: Marinus Maw, MD;  Location: [facility];  Service: Cardiovascular;  Laterality: N/A;   OTHER SURGICAL HISTORY  1983   hysterectomy ovaries intact, prolapsed    OTHER SURGICAL HISTORY  2006   pyloric stricture surgery   TOTAL HIP ARTHROPLASTY Right 06/07/2015   Procedure: TOTAL HIP ARTHROPLASTY ANTERIOR APPROACH;  Surgeon: Gean Birchwood, MD;  Location: [facility] OR;  Service: Orthopedics;  Laterality: Right;   Social History:  reports that she has never smoked. She has never used smokeless tobacco. She reports that she does not drink alcohol and does not use drugs.  Allergies  Allergen Reactions   Hydrochlorothiazide Nausea Only   Penicillins Other (See Comments)    Unknown Other reaction(s): Other (See Comments) Pt was a child   Adhesive [Tape]     Pt states ' it took my skin off' Steri-Strips   Atorvastatin Calcium     Other reaction(s): insomnia   Codeine Nausea And Vomiting   Sulfa Antibiotics Nausea And Vomiting   Trimethoprim    Vicodin [Hydrocodone-Acetaminophen] Nausea And Vomiting and Other (See Comments)    Did not feel like  herself    Family History  Problem Relation Age of Onset   Hypertension Mother    Cirrhosis Father    Hypertension Father    OCD Sister    Depression Sister    Depression Sister    Hypertension Sister     Prior to Admission medications   Medication Sig Start Date End Date Taking? Authorizing Provider  albuterol (PROVENTIL HFA;VENTOLIN HFA) 108 (90 Base) MCG/ACT inhaler Inhale 1-2 puffs into the lungs every 4 (four) hours as needed for wheezing or shortness of breath.    [provider]  carvedilol (COREG) 6.25 MG tablet Take 1 tablet (6.25 mg total) by mouth 2 (two) times daily. 09/25/22   Croitoru, Mihai, MD  fluticasone (FLONASE) 50 MCG/ACT  nasal spray Place 2 sprays into both nostrils as needed for allergies or rhinitis.    [provider]  loratadine (CLARITIN) 10 MG tablet Take 10 mg by mouth daily.    [provider]  Multiple Vitamins-Minerals (CENTRUM SILVER 50+WOMEN PO) Take 1 tablet by mouth daily.    [provider]  olmesartan (BENICAR) 40 MG tablet Take 1 tablet (40 mg total) by mouth daily. 07/19/22   Croitoru, Mihai, MD  pantoprazole (PROTONIX) 40 MG tablet Take 40 mg by mouth 2 (two) times daily.    [provider]  rosuvastatin (CRESTOR) 10 MG tablet Take 1 tablet (10 mg total) by mouth daily. 03/13/22   Swinyer, Zachary George, NP  sucralfate (CARAFATE) 1 g tablet Take 1 g by mouth 2 (two) times daily as needed (Takes if Pantoprazole is not working).     [provider]  VITAMIN D PO Take 1 tablet by mouth daily.    [provider]  warfarin (COUMADIN) 5 MG tablet Take 1 tablet (5 mg total) by mouth as directed. 03/30/22   Marinus Maw, MD    Physical Exam: Vitals:   09/25/22 2030 09/25/22 2100 09/25/22 2114 09/25/22 2115  BP: (!) 139/96 (!) 155/84  (!) 141/85  Pulse: 66 65  74  Resp: 16 17  14   Temp:   98.1 F (36.7 C)   TempSrc:   Oral   SpO2: 100% 100%  100%  Weight:      Height:       Constitutional: NAD, calm, comfortable Respiratory: clear to auscultation bilaterally, no wheezing, no crackles. Normal respiratory effort. No accessory muscle use.  Cardiovascular: Regular rate and rhythm, no murmurs / rubs / gallops. No extremity edema. 2+ pedal pulses. No carotid bruits.  Abdomen: no tenderness, no masses palpated. No hepatosplenomegaly. Bowel sounds positive.  Neurologic: CN 2-12 grossly intact. Sensation intact, DTR normal. Strength 5/5 in all 4.  Psychiatric: Normal judgment and insight. Alert and oriented x 3. Normal mood.   Data Reviewed:    Labs on Admission: I have personally reviewed following labs and imaging studies  CBC: Recent Labs   Lab 09/25/22 1308  WBC 5.6  HGB 13.7  HCT 40.9  MCV 92.7  PLT 246   Basic Metabolic Panel: Recent Labs  Lab 09/25/22 1308  NA 134*  K 3.9  CL 96*  CO2 24  GLUCOSE 97  BUN 10  CREATININE 0.87  CALCIUM 9.4   GFR: Estimated Creatinine Clearance: 42 mL/min (by C-G formula based on SCr of 0.87 mg/dL). Liver Function Tests: No results for input(s): "AST", "ALT", "ALKPHOS", "BILITOT", "PROT", "ALBUMIN" in the last 168 hours. No results for input(s): "LIPASE", "AMYLASE" in the last 168 hours. No results for input(s): "  AMMONIA" in the last 168 hours. Coagulation Profile: No results for input(s): "INR", "PROTIME" in the last 168 hours. Cardiac Enzymes: No results for input(s): "CKTOTAL", "CKMB", "CKMBINDEX", "TROPONINI" in the last 168 hours. BNP (last 3 results) No results for input(s): "PROBNP" in the last 8760 hours. HbA1C: No results for input(s): "HGBA1C" in the last 72 hours. CBG: No results for input(s): "GLUCAP" in the last 168 hours. Lipid Profile: No results for input(s): "CHOL", "HDL", "LDLCALC", "TRIG", "CHOLHDL", "LDLDIRECT" in the last 72 hours. Thyroid Function Tests: No results for input(s): "TSH", "T4TOTAL", "FREET4", "T3FREE", "THYROIDAB" in the last 72 hours. Anemia Panel: No results for input(s): "VITAMINB12", "FOLATE", "FERRITIN", "TIBC", "IRON", "RETICCTPCT" in the last 72 hours. Urine analysis:    Component Value Date/Time   COLORURINE YELLOW 05/25/2015 1446   APPEARANCEUR CLEAR 05/25/2015 1446   LABSPEC 1.008 05/25/2015 1446   PHURINE 6.5 05/25/2015 1446   GLUCOSEU NEGATIVE 05/25/2015 1446   HGBUR NEGATIVE 05/25/2015 1446   BILIRUBINUR NEGATIVE 05/25/2015 1446   KETONESUR NEGATIVE 05/25/2015 1446   PROTEINUR NEGATIVE 05/25/2015 1446   UROBILINOGEN 0.2 04/14/2013 1205   NITRITE NEGATIVE 05/25/2015 1446   LEUKOCYTESUR NEGATIVE 05/25/2015 1446    Radiological Exams on Admission: DG Chest 2 View  Result Date: 09/25/2022 CLINICAL DATA:   Provided history: Chest pain. EXAM: CHEST - 2 VIEW COMPARISON:  Prior chest radiographs 03/15/2016 and earlier. FINDINGS: Heart size within normal limits. Aortic atherosclerosis. Biapical pleuroparenchymal scarring. No appreciable airspace consolidation or pulmonary edema. No evidence of pleural effusion or pneumothorax. No acute osseous abnormality identified. Thoracic spondylosis. Mild levocurvature of the lower thoracic spine. IMPRESSION: 1.  No evidence of an acute cardiopulmonary abnormality. 2.  Aortic Atherosclerosis (ICD10-I70.0). Electronically Signed   By: Jackey Loge D.O.   On: 09/25/2022 14:14    EKG: Independently reviewed.   Assessment and Plan: * Hypertensive urgency CP today most likely HTN urgency secondary to BP 190/130 with EMS.  CP resolved after NTG (and BP improved). Trop neg x2 CTA coronaries in Nov was mostly unimpressive, only showing: Mid D1 stenosis of borderline hemodynamic significance  Thus I'm doubtful that this represents ACS Complete resolution of symptoms after BP improvement also not really c/w acute aortic syndrome. CP obs pathway Will let pt eat Control BP with labetalol PRN tonight Cont home coreg and ARB Sending message to cards so they can weigh in if needed.  Autonomic dysfunction Due to h/o autonomic dysfunction with episodes of both HYPERtension and HYPOtension including syncope and injury with the latter, Will defer any permanent changes to patients HTN meds to cardiology, sounds like Dr. Ladona Ridgel has been adjusting her meds in office recently to try and get this controlled. Will send message to cards Cardell Peach and include Dr. Ladona Ridgel), so they can at least weigh in on plan in AM and communicate with day team.  PAF (paroxysmal atrial fibrillation) (HCC) Cont coreg 6.25mg  BID Cont coumadin per pharm      Advance Care Planning:   Code Status: Full Code  Consults: Message sent to P. Johna Sheriff, Dr. Lewayne Bunting included  Family Communication:  Husband at bedside  Severity of Illness: The appropriate patient status for this patient is OBSERVATION. Observation status is judged to be reasonable and necessary in order to provide the required intensity of service to ensure the patient's safety. The patient's presenting symptoms, physical exam findings, and initial radiographic and laboratory data in the context of their medical condition is Garrett to place them at decreased risk for further clinical  deterioration. Furthermore, it is anticipated that the patient will be medically stable for discharge from the hospital within 2 midnights of admission.   Author: Hillary Bow., DO 09/25/2022 9:35 PM  For on call review [email or web address].

## 2022-09-26 ENCOUNTER — Encounter: Payer: Self-pay | Admitting: Internal Medicine

## 2022-09-26 ENCOUNTER — Telehealth: Payer: Self-pay

## 2022-09-26 DIAGNOSIS — R0789 Other chest pain: Principal | ICD-10-CM

## 2022-09-26 DIAGNOSIS — I160 Hypertensive urgency: Secondary | ICD-10-CM | POA: Diagnosis not present

## 2022-09-26 LAB — PROTIME-INR
INR: 2.4 — ABNORMAL HIGH (ref 0.8–1.2)
Prothrombin Time: 26.2 seconds — ABNORMAL HIGH (ref 11.4–15.2)

## 2022-09-26 MED ORDER — CARVEDILOL 3.125 MG PO TABS: 9.3750 mg | ORAL_TABLET | Freq: Two times a day (BID) | ORAL | 1 refills | Status: DC

## 2022-09-26 MED ORDER — IRBESARTAN 300 MG PO TABS: 300.0000 mg | ORAL_TABLET | Freq: Every day | ORAL | 0 refills | Status: DC

## 2022-09-26 NOTE — Consult Note (Addendum)
Cardiology Consultation   Patient ID: Danielle Garrett MRN: [id number]; DOB: 30-Oct-1950  Admit date: 09/25/2022 Date of Consult: 09/26/2022  PCP:  Farris Has, MD   [facility] Providers Cardiologist:  Peter Swaziland, MD     Patient Profile:   Danielle Garrett is a 72 y.o. female with a hx of HTN, neurally mediated syncope, paroxsymal afib, CAD on CT, palpitations who is being seen 09/26/2022 for the evaluation of chest pain at the request of Dr. Julian Reil.  History of Present Illness:   Danielle Garrett is a 72 yo female with PMH noted above. She has been followed by Dr. Katrinka Blazing as well as Dr. Ladona Ridgel as an outpatient. 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 She was seen by Dr. Ladona Ridgel and had a loop recorder placed in 2019.   Had an episode of syncope while at [facility] orthopedics. Wore a cardiac monitor 07/2021 showing sinus rhythm with BBB, no findings to explain syncope.  Underwent coronary CTA 02/2022 that showed coronary Ca+ score of 207, moderate D1 stenosis of borderline hemodynamic significance. Results were reviewed by Dr. Katrinka Blazing and deemed non-obstructive. At this same time she was found to have paroxysmal afib, recommended that she start on metoprolol succ 25mg  daily and Eliquis 5mg  BID. Her Crestor was also increased from Crestor 3 x/week to 10mg  daily.   Last seen in the office on 03/2022 with Dr. Ladona Ridgel and reported doing well. Encouraged to maintain hydration, continued on same meds.   Recent phone note from PharmD noted patient continuing to have issues with elevated BPs, advised to increase her coreg to 6.25mg  BID, continue olmesartan 40mg  daily.   Presented to the ED on 6/10 with complaints of chest pain. Reports she has been under significant amount of personal stress recently. Her husband is a Education officer, environmental, they have been visiting with several sick patrons. Says she woke up yesterday, in her usual state of health and read a devotional.  Shortly afterwards noted a centralized chest pressure under the left breast, sort of squeezing sensation. Attempted to walk around, get up and move. Nothing seemed to help with symptoms. This lingered for about 5 hours and she eventually went to UC. They note her BP was significantly elevated 196/130. Reported they were concerned about her EKG and sent her to the ED.  In the ED her labs showed sodium 134, potassium 3.9, Cr 0.87, hsTn 4>>4, WBC 5.6, Hgb 13.7, INR 2.2. EKG showed sinus rhythm 80 bpm, LBBB. CXR negative. Blood pressures noted significantly elevated on arrival 190/130 per report. Symptoms improved after SL nitro. She was admitted for further management to internal medicine, cardiology asked to evaluate.   In talking with patient, her symptoms have significantly improved as her blood pressures improved.   Past Medical History:  Diagnosis Date   Allergic rhinitis due to other allergen    Anginal pain (HCC) 2011   hospitalized for CP in the past; due to low potassium   Anxiety    Arthritis    Childhood asthma    Chronic hoarseness    Gastroparesis    GERD (gastroesophageal reflux disease)    History of hiatal hernia    Hx of blood transfusion reaction    in the early 80s   Hypertension    Left bundle branch block     (abnormal heart rhythm)   PONV (postoperative nausea and vomiting)    Pyloric stenosis     Past Surgical History:  Procedure Laterality  Date   ABDOMINAL HYSTERECTOMY  1983   CARDIAC CATHETERIZATION  10/2009   Cardiac cath normal coronary arteries   CERVICAL DISC ARTHROPLASTY     disc replacement in neck 1998   COLONOSCOPY     10 year repeat 09/26/2010   ESOPHAGOGASTRODUODENOSCOPY  09/26/2010,01/08/13   LOOP RECORDER INSERTION N/A 05/29/2016   Procedure: Loop Recorder Insertion;  Surgeon: Marinus Maw, MD;  Location: [facility];  Service: Cardiovascular;  Laterality: N/A;   OTHER SURGICAL HISTORY  1983   hysterectomy ovaries intact, prolapsed     OTHER SURGICAL HISTORY  2006   pyloric stricture surgery   TOTAL HIP ARTHROPLASTY Right 06/07/2015   Procedure: TOTAL HIP ARTHROPLASTY ANTERIOR APPROACH;  Surgeon: Gean Birchwood, MD;  Location: [facility] OR;  Service: Orthopedics;  Laterality: Right;     Inpatient Medications: Scheduled Meds:  carvedilol  6.25 mg Oral BID   irbesartan  300 mg Oral Daily   rosuvastatin  10 mg Oral Daily   warfarin  5 mg Oral ONCE-1600   Warfarin - Pharmacist Dosing Inpatient   Does not apply q1600   Continuous Infusions:  PRN Meds: acetaminophen, labetalol, ondansetron (ZOFRAN) IV  Allergies:    Allergies  Allergen Reactions   Hydrochlorothiazide Nausea Only   Penicillins Other (See Comments)    Unknown Other reaction(s): Other (See Comments) Pt was a child   Adhesive [Tape]     Pt states ' it took my skin off' Steri-Strips   Atorvastatin Calcium     Other reaction(s): insomnia   Codeine Nausea And Vomiting   Fentanyl Nausea And Vomiting   Sulfa Antibiotics Nausea And Vomiting   Trimethoprim    Vicodin [Hydrocodone-Acetaminophen] Nausea And Vomiting and Other (See Comments)    Did not feel like herself    Social History:   Social History   Socioeconomic History   Marital status: Married    Spouse name: Not on file   Number of children: Not on file   Years of education: Not on file   Highest education level: Not on file  Occupational History   Not on file  Tobacco Use   Smoking status: Never   Smokeless tobacco: Never  Vaping Use   Vaping Use: Never used  Substance and Sexual Activity   Alcohol use: No    Alcohol/week: 0.0 standard drinks of alcohol   Drug use: No   Sexual activity: Not on file  Other Topics Concern   Not on file  Social History Narrative   ** Merged History Encounter **    Right handed    Social Determinants of Health   Financial Resource Strain: Not on file  Food Insecurity: Not on file  Transportation Needs: Not on file  Physical Activity: Not on file   Stress: Not on file  Social Connections: Not on file  Intimate Partner Violence: Not on file    Family History:    Family History  Problem Relation Age of Onset   Hypertension Mother    Cirrhosis Father    Hypertension Father    OCD Sister    Depression Sister    Depression Sister    Hypertension Sister      ROS:  Please see the history of present illness.   All other ROS reviewed and negative.     Physical Exam/Data:   Vitals:   09/26/22 0605 09/26/22 0630 09/26/22 0900 09/26/22 0916  BP: 129/75 134/76 128/69   Pulse: 70 70 87   Resp: 13 13  15   Temp:    98 F (36.7 C)  TempSrc:    Oral  SpO2: 100% 100% 100%   Weight:      Height:        Intake/Output Summary (Last 24 hours) at 09/26/2022 1102 Last data filed at 09/26/2022 0044 Gross per 24 hour  Intake --  Output 1 ml  Net -1 ml      09/25/2022    6:24 PM 07/19/2022   10:13 AM 03/30/2022    2:17 PM  Last 3 Weights  Weight (lbs) 120 lb 122 lb 118 lb 3.2 oz  Weight (kg) 54.432 kg 55.339 kg 53.615 kg     Body mass index is 23.44 kg/m.  General:  Well nourished, well developed, in no acute distress HEENT: normal Neck: no JVD Vascular: No carotid bruits; Distal pulses 2+ bilaterally Cardiac:  normal S1, S2; RRR; no murmur  Lungs:  clear to auscultation bilaterally, no wheezing, rhonchi or rales  Abd: soft, nontender, no hepatomegaly  Ext: no edema Musculoskeletal:  No deformities, BUE and BLE strength normal and equal Skin: warm and dry  Neuro:  CNs 2-12 intact, no focal abnormalities noted Psych:  Normal affect   EKG:  The EKG was personally reviewed and demonstrates:  sinus rhythm 80 bpm, LBBB  Relevant CV Studies:  Echo: 2017  Study Conclusions   - Left ventricle: The cavity size was normal. Wall thickness was    increased in a pattern of mild LVH. Systolic function was normal.    The estimated ejection fraction was in the range of 60% to 65%.    Wall motion was normal; there were no  regional wall motion    abnormalities. Doppler parameters are consistent with abnormal    left ventricular relaxation (grade 1 diastolic dysfunction). The    E/e&' ratio is between 8-15, suggesting indeterminate LV filling    pressure.  - Mitral valve: Mildly thickened leaflets . There was trivial    regurgitation.  - Left atrium: The atrium was normal in size.  - Tricuspid valve: There was mild regurgitation.  - Pulmonary arteries: PA peak pressure: 33 mm Hg (S).  - Inferior vena cava: The vessel was normal in size. The    respirophasic diameter changes were in the normal range (>= 50%),    consistent with normal central venous pressure.   Impressions:   - LVEF 60-65%, mild LVH, normal wall motion, diastolic dysfunction,    indeterminate LV filling pressure, normal LA size, mild TR, RVSP    33 mmHg, normal IVC.    Laboratory Data:  High Sensitivity Troponin:   Recent Labs  Lab 09/25/22 1308 09/25/22 1600  TROPONINIHS 4 4     Chemistry Recent Labs  Lab 09/25/22 1308  NA 134*  K 3.9  CL 96*  CO2 24  GLUCOSE 97  BUN 10  CREATININE 0.87  CALCIUM 9.4  GFRNONAA >60  ANIONGAP 14    No results for input(s): "PROT", "ALBUMIN", "AST", "ALT", "ALKPHOS", "BILITOT" in the last 168 hours. Lipids No results for input(s): "CHOL", "TRIG", "HDL", "LABVLDL", "LDLCALC", "CHOLHDL" in the last 168 hours.  Hematology Recent Labs  Lab 09/25/22 1308  WBC 5.6  RBC 4.41  HGB 13.7  HCT 40.9  MCV 92.7  MCH 31.1  MCHC 33.5  RDW 13.0  PLT 246   Thyroid No results for input(s): "TSH", "FREET4" in the last 168 hours.  BNPNo results for input(s): "BNP", "PROBNP" in the last 168 hours.  DDimer  No results for input(s): "DDIMER" in the last 168 hours.   Radiology/Studies:  DG Chest 2 View  Result Date: 09/25/2022 CLINICAL DATA:  Provided history: Chest pain. EXAM: CHEST - 2 VIEW COMPARISON:  Prior chest radiographs 03/15/2016 and earlier. FINDINGS: Heart size within normal limits.  Aortic atherosclerosis. Biapical pleuroparenchymal scarring. No appreciable airspace consolidation or pulmonary edema. No evidence of pleural effusion or pneumothorax. No acute osseous abnormality identified. Thoracic spondylosis. Mild levocurvature of the lower thoracic spine. IMPRESSION: 1.  No evidence of an acute cardiopulmonary abnormality. 2.  Aortic Atherosclerosis (ICD10-I70.0). Electronically Signed   By: Jackey Loge D.O.   On: 09/25/2022 14:14     Assessment and Plan:   Neema Barreira is a 72 y.o. female with a hx of HTN, neurally mediated syncope, paroxsymal afib, CAD on CT, palpitations who is being seen 09/26/2022 for the evaluation of chest pain at the request of Dr. Julian Reil.  Hypertensive Urgency Chest pain -- presented with several hours of chest pain, hsTn 4>>4. EKG without significant ST/T wave changes. Suspect symptoms related to poorly controlled HTN as well as stress, symptoms now resolved with improved BPs -- recent coronary CT with only moderate D1 stenosis 02/2022 -- discussed increasing her Coreg from 6.25mg  BID--> 9.375mg  BID (need to be cautious with her hx of syncope) continue olmesartan 40mg  daily -- she will continue to monitor her BPs and bring to follow up appt  Paroxysmal Afib -- maintaining SR, has palpitations occ -- on coumadin, INR 2.2  Hx of long standing syncope -- extensive work up over the years including loop and cardiac monitors -- no high risk cardiac arrhthymias noted   Will arrange outpatient follow up   For questions or updates, please contact [facility] Please consult [email or web address] for contact info under    Signed, Laverda Page, NP  09/26/2022 11:02 AM

## 2022-09-26 NOTE — Telephone Encounter (Signed)
Lpmtcb and reschedule INR check 

## 2022-09-27 ENCOUNTER — Telehealth: Payer: Self-pay

## 2022-09-27 ENCOUNTER — Ambulatory Visit: Payer: Medicare Other

## 2022-09-27 ENCOUNTER — Encounter: Payer: Self-pay | Admitting: Internal Medicine

## 2022-09-27 ENCOUNTER — Ambulatory Visit (INDEPENDENT_AMBULATORY_CARE_PROVIDER_SITE_OTHER): Payer: Medicare Other

## 2022-09-27 ENCOUNTER — Telehealth: Payer: Self-pay | Admitting: Internal Medicine

## 2022-09-27 DIAGNOSIS — Z5181 Encounter for therapeutic drug level monitoring: Secondary | ICD-10-CM | POA: Diagnosis not present

## 2022-09-27 DIAGNOSIS — I480 Paroxysmal atrial fibrillation: Secondary | ICD-10-CM | POA: Diagnosis not present

## 2022-09-27 NOTE — Telephone Encounter (Signed)
Spoke with patient and advised no provider in office today, STRONGLY encourage patient to go to ED for evaluation

## 2022-09-27 NOTE — Telephone Encounter (Signed)
MyChart message received Urgently needs to be addressed.    Spoke to Pt, and stated she was seen in ER on 09/25/2022, for a hypertension concern and chest pain.  Pt stated BP 196/133.  Per Pt ER provider detected an abnormal EKG, but no STEMI diagnosed.  Pt advised to take Coreg- Take 3 tablets ( 9.375 mg ) by mouth, 2 times daily.     Pt stated when she got up this morning, prior to 800 am,  she took her medications, and had a syncopal episode, falling on the floor, and was "out."  Pt stated her husband could not feel her pulse, so he picked her up, and put her in bed.  Pt states in the past, they have placed her in the bed during a similar event.  Pt was not responsive from 800 am to 830 am, per Pt's approximate time line shared.  Pt spouse did NOT call 911 / EMS.    Pt then states she got up with assistance, and felt fatigued and weak.  Pt BP was 106/86.  No HR was assessed.  Since Pt's syncopal episode lasted 30 minutes, after getting something to eat / drink, Pt sent MyChart message at 945 am on 09/27/22.  [facility] Triage RN, Shelda Jakes, RN saw the message and made me aware so I could contact her.    Pt shared the above information with me, and I advised the ER for providers care to assess the unknown syncopal episode lasting 30 minutes.  Pt stated she did not want to go back to the ER.  I placed the Pt on hold, and searched [facility] [address] [facility] [employer] and did not fine any open appointments.    When I picked up the call again, Pt neighbor, who identified herself as an Charity fundraiser, stated she would speak on Pt and husband behalf.  I advised the nurse that I checked for an open appointment with [facility], and no appointments could be found during the hold;  I HIGHLY advised a return trip to the nearest ER for a providers care / assessment of this syncopal episode.  Pt nurse neighbor, stated Pt and spouse did not want to go to the ER again.  Pt neighbor did not seem understand the urgency for an  ER visit.  I advised that they can call [facility] again, and speak with someone if they so choose... but HIGHLY advised urgent / emergent trip to nearest ER for care, as this was my best advise over the telephone.  Pt nurse neighbor / friend verbalized understanding, and stated they would look into this.   ===================================  MyChart message received at 1036 am on 09/27/2022: From Pt Danielle Garrett I am going to [facility] to follow your advice. I'm sure your office will get records from [facility].   My 1st choice / advice was back to the nearest ER for care / call 911, as stated above, and due to the length of time the Pt was down. When the Pt, spouse, and neighbor would not consider my advice, the other choice was mentioned as a last effort to get Pt to see a provider today.  Per the MyChart message I just received, the Pt will get to see a provider this morning.  Pt and spouse require education on what is considered an emergency, and what wait to be seen in a few days.

## 2022-09-27 NOTE — Telephone Encounter (Signed)
Pt c/o Syncope: STAT if syncope occurred within 30 minutes and pt complains of lightheadedness High Priority if episode of passing out, completely, today or in last 24 hours   Did you pass out today? Yes    When is the last time you passed out? Last year   Has this occurred multiple times? Yes    Did you have any symptoms prior to passing out? Weak, clammy, legs felt like jello, lightheadedness

## 2022-09-27 NOTE — Telephone Encounter (Signed)
See RN telephone note, in its entirety.    Pt was called urgently, and later MyChart follow up message sent.  Please review both the RN note, and MyChart message.

## 2022-09-27 NOTE — Telephone Encounter (Signed)
Spoke with patient's friend Danielle Garrett who is with patient at time of call. Danielle Garrett called back in to attempt to make appointment at [facility] location. She did not realize her call had been routed back to triage.  Danielle Garrett states patient does not want to return to the ED for further evaluation and would prefer to be seen in the office.  RN reviewed previous call notes and advised based on the assessment from the first nurse call, Patient should go to the ED for urgent work up. Provided education on the type of resources offered in ED versus resources available in an office setting.    Patient is schedule to see Eligha Bridegroom, NP in our office on 10/05/22.  Patient and her friend verbalized understanding and had no questions.

## 2022-09-27 NOTE — Telephone Encounter (Signed)
Called and spoke with pt's husband, Rosanne Ashing. Made him aware INR was checked in ED on 6/11 and the Anticoagulation Clinic can use this results to dose her Warfarin. Rosanne Ashing stated pt was asleep; however if the Anticoagulation Clinic could call her back this afternoon, they would greatly appreciate it.   Please refer to anticoagulation encounter.

## 2022-09-27 NOTE — Patient Instructions (Signed)
Description   Called and spoke with pt. Instructed to continue taking 1 tablet daily.  Stay consistent with greens each week.  Recheck INR in 4 weeks.  Anticoagulation Clinic [phone]
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
## 2022-09-29 ENCOUNTER — Ambulatory Visit: Payer: Medicare Other | Admitting: Internal Medicine

## 2022-10-05 ENCOUNTER — Ambulatory Visit: Payer: Medicare Other | Attending: Nurse Practitioner | Admitting: Nurse Practitioner

## 2022-10-05 ENCOUNTER — Telehealth: Payer: Self-pay | Admitting: Nurse Practitioner

## 2022-10-05 ENCOUNTER — Encounter: Payer: Self-pay | Admitting: Nurse Practitioner

## 2022-10-05 VITALS — BP 160/90 | HR 72 | Ht 60.0 in | Wt 123.6 lb

## 2022-10-05 DIAGNOSIS — R55 Syncope and collapse: Secondary | ICD-10-CM | POA: Diagnosis not present

## 2022-10-05 DIAGNOSIS — I480 Paroxysmal atrial fibrillation: Secondary | ICD-10-CM | POA: Diagnosis not present

## 2022-10-05 DIAGNOSIS — R0989 Other specified symptoms and signs involving the circulatory and respiratory systems: Secondary | ICD-10-CM | POA: Diagnosis not present

## 2022-10-05 DIAGNOSIS — I10 Essential (primary) hypertension: Secondary | ICD-10-CM

## 2022-10-05 DIAGNOSIS — Z7901 Long term (current) use of anticoagulants: Secondary | ICD-10-CM | POA: Diagnosis not present

## 2022-10-05 DIAGNOSIS — I447 Left bundle-branch block, unspecified: Secondary | ICD-10-CM | POA: Diagnosis not present

## 2022-10-05 DIAGNOSIS — I2510 Atherosclerotic heart disease of native coronary artery without angina pectoris: Secondary | ICD-10-CM

## 2022-10-05 DIAGNOSIS — I160 Hypertensive urgency: Secondary | ICD-10-CM | POA: Insufficient documentation

## 2022-10-05 NOTE — Telephone Encounter (Signed)
No objections

## 2022-10-05 NOTE — Addendum Note (Signed)
Addended by: Levi Aland on: 10/05/2022 04:46 PM   Modules accepted: Orders

## 2022-10-05 NOTE — Patient Instructions (Signed)
Medication Instructions:   Your physician recommends that you continue on your current medications as directed. Please refer to the Current Medication list given to you today.   *If you need a refill on your cardiac medications before your next appointment, please call your pharmacy*   Lab Work:  None ordered.  If you have labs (blood work) drawn today and your tests are completely normal, you will receive your results only by: MyChart Message (if you have MyChart) OR A paper copy in the mail If you have any lab test that is abnormal or we need to change your treatment, we will call you to review the results.   Testing/Procedures:  None ordered.   Follow-Up: At [facility], you and your health needs are our priority.  As part of our continuing mission to provide you with exceptional heart care, we have created designated Provider Care Teams.  These Care Teams include your primary Cardiologist (physician) and Advanced Practice Providers (APPs -  Physician Assistants and Nurse Practitioners) who all work together to provide you with the care you need, when you need it.  We recommend signing up for the patient portal called "MyChart".  Sign up information is provided on this After Visit Summary.  MyChart is used to connect with patients for Virtual Visits (Telemedicine).  Patients are able to view lab/test results, encounter notes, upcoming appointments, etc.  Non-urgent messages can be sent to your provider as well.   To learn more about what you can do with MyChart, go to [email or web address].    Your next appointment:   2 week(s)  Provider:   Lewayne Bunting, MD    Other Instructions  Keep your appointment with Dr. Thomasene Lot in October.

## 2022-10-05 NOTE — Telephone Encounter (Signed)
Patient is requesting to switch providers from Dr. Swaziland to Dr. Royann Shivers. She was previously a Dr. Katrinka Blazing patient that has only seen me and Dr. Ladona Ridgel since Dr. Michaelle Copas retirement.   Please comment your agreement/disagreement.  Thank you, Marcelino Duster

## 2022-10-08 NOTE — Telephone Encounter (Signed)
Please schedule follow-up with Dr. Royann Shivers in 3-4 months and cancel the appointment with Dr. [place].

## 2022-10-09 ENCOUNTER — Ambulatory Visit: Payer: Medicare Other

## 2022-10-09 NOTE — Telephone Encounter (Signed)
Patient has been scheduled

## 2022-10-17 ENCOUNTER — Ambulatory Visit: Payer: Medicare Other | Attending: Internal Medicine | Admitting: Internal Medicine

## 2022-10-17 ENCOUNTER — Encounter: Payer: Self-pay | Admitting: Internal Medicine

## 2022-10-17 ENCOUNTER — Ambulatory Visit: Payer: Medicare Other

## 2022-10-17 VITALS — BP 160/92 | HR 66 | Ht 60.0 in | Wt 123.0 lb

## 2022-10-17 DIAGNOSIS — R55 Syncope and collapse: Secondary | ICD-10-CM | POA: Insufficient documentation

## 2022-10-17 DIAGNOSIS — R0989 Other specified symptoms and signs involving the circulatory and respiratory systems: Secondary | ICD-10-CM

## 2022-10-17 DIAGNOSIS — I2510 Atherosclerotic heart disease of native coronary artery without angina pectoris: Secondary | ICD-10-CM | POA: Diagnosis not present

## 2022-10-17 LAB — LIPID PANEL
Chol/HDL Ratio: 2.3 ratio (ref 0.0–4.4)
Cholesterol, Total: 163 mg/dL (ref 100–199)
HDL: 71 mg/dL (ref >39)
LDL Chol Calc (NIH): 78 mg/dL (ref 0–99)
Triglycerides: 71 mg/dL (ref 0–149)
VLDL Cholesterol Cal: 14 mg/dL (ref 5–40)

## 2022-10-17 LAB — HEPATIC FUNCTION PANEL
ALT: 18 IU/L (ref 0–32)
AST: 21 IU/L (ref 0–40)
Albumin: 4.4 g/dL (ref 3.8–4.8)
Alkaline Phosphatase: 107 IU/L (ref 44–121)
Bilirubin Total: 0.4 mg/dL (ref 0.0–1.2)
Bilirubin, Direct: 0.13 mg/dL (ref 0.00–0.40)
Total Protein: 6.7 g/dL (ref 6.0–8.5)

## 2022-10-17 NOTE — Patient Instructions (Signed)
Medication Instructions:  Your physician recommends that you continue on your current medications as directed. Please refer to the Current Medication list given to you today.  *If you need a refill on your cardiac medications before your next appointment, please call your pharmacy*  Follow-Up: At [facility], you and your health needs are our priority.  As part of our continuing mission to provide you with exceptional heart care, we have created designated Provider Care Teams.  These Care Teams include your primary Cardiologist (physician) and Advanced Practice Providers (APPs -  Physician Assistants and Nurse Practitioners) who all work together to provide you with the care you need, when you need it.  Your next appointment:   Next available for loop recorder implant with Dr. Ladona Ridgel

## 2022-10-17 NOTE — Progress Notes (Signed)
HPI Danielle Garrett returns today for followup. She is a pleasant 72 yo woman with a h/o HTN, and long standing neurally mediated syncope. I saw her back in June. She worn a cardiac monitor which did not show any pauses or rapid heart beats. She has left bundle branch block. She has passed out since I saw while she was int the shower. She has known atrial fib but does not feel palpitations but I suspect that her episodes of feeling badly are due to atrial fib. She was prescribed systemic anti-coagulation.  She has non-cardiac chest pain and is anxious after a CT scan showed non-obstructive CAD. She was placed on increasing doses of coreg, now 9.275 twice daily.  Allergies  Allergen Reactions   Hydrochlorothiazide Nausea Only   Penicillins Other (See Comments)    Unknown Other reaction(s): Other (See Comments) Pt was a child   Adhesive [Tape]     Pt states ' it took my skin off' Steri-Strips   Atorvastatin Calcium     Other reaction(s): insomnia   Codeine Nausea And Vomiting   Fentanyl Nausea And Vomiting   Sulfa Antibiotics Nausea And Vomiting   Trimethoprim    Vicodin [Hydrocodone-Acetaminophen] Nausea And Vomiting and Other (See Comments)    Did not feel like herself     Current Outpatient Medications  Medication Sig Dispense Refill   albuterol (PROVENTIL HFA;VENTOLIN HFA) 108 (90 Base) MCG/ACT inhaler Inhale 1-2 puffs into the lungs every 4 (four) hours as needed for wheezing or shortness of breath.     calcium-vitamin D (OSCAL WITH D) 500-5 MG-MCG tablet Take 1 tablet by mouth daily.     carvedilol (COREG) 3.125 MG tablet Take 3 tablets (9.375 mg total) by mouth 2 (two) times daily. 60 tablet 1   fluticasone (FLONASE) 50 MCG/ACT nasal spray Place 2 sprays into both nostrils daily as needed for allergies or rhinitis.     irbesartan (AVAPRO) 300 MG tablet Take 1 tablet (300 mg total) by mouth daily. 30 tablet 0   loratadine (CLARITIN) 10 MG tablet Take 10 mg by mouth daily as  needed for allergies.     Multiple Vitamins-Minerals (CENTRUM SILVER 50+WOMEN PO) Take 1 tablet by mouth daily.     rosuvastatin (CRESTOR) 10 MG tablet Take 1 tablet (10 mg total) by mouth daily. 90 tablet 3   warfarin (COUMADIN) 5 MG tablet Take 1 tablet (5 mg total) by mouth as directed. (Patient taking differently: Take 5 mg by mouth daily.) 90 tablet 3   gabapentin (NEURONTIN) 300 MG capsule Take 300 mg by mouth daily as needed (pain).     No current facility-administered medications for this visit.     Past Medical History:  Diagnosis Date   Allergic rhinitis due to other allergen    Anginal pain (HCC) 2011   hospitalized for CP in the past; due to low potassium   Anxiety    Arthritis    Childhood asthma    Chronic hoarseness    Gastroparesis    GERD (gastroesophageal reflux disease)    History of hiatal hernia    Hx of blood transfusion reaction    in the early 80s   Hypertension    Left bundle branch block     (abnormal heart rhythm)   PONV (postoperative nausea and vomiting)    Pyloric stenosis     ROS:   All systems reviewed and negative except as noted in the HPI.   Past Surgical History:  Procedure Laterality Date   ABDOMINAL HYSTERECTOMY  1983   CARDIAC CATHETERIZATION  10/2009   Cardiac cath normal coronary arteries   CERVICAL DISC ARTHROPLASTY     disc replacement in neck 1998   COLONOSCOPY     10 year repeat 09/26/2010   ESOPHAGOGASTRODUODENOSCOPY  09/26/2010,01/08/13   LOOP RECORDER INSERTION N/A 05/29/2016   Procedure: Loop Recorder Insertion;  Surgeon: Marinus Maw, MD;  Location: [facility];  Service: Cardiovascular;  Laterality: N/A;   OTHER SURGICAL HISTORY  1983   hysterectomy ovaries intact, prolapsed    OTHER SURGICAL HISTORY  2006   pyloric stricture surgery   TOTAL HIP ARTHROPLASTY Right 06/07/2015   Procedure: TOTAL HIP ARTHROPLASTY ANTERIOR APPROACH;  Surgeon: Gean Birchwood, MD;  Location: [facility] OR;  Service: Orthopedics;  Laterality:  Right;     Family History  Problem Relation Age of Onset   Hypertension Mother    Cirrhosis Father    Hypertension Father    OCD Sister    Depression Sister    Depression Sister    Hypertension Sister      Social History   Socioeconomic History   Marital status: Married    Spouse name: Not on file   Number of children: Not on file   Years of education: Not on file   Highest education level: Not on file  Occupational History   Not on file  Tobacco Use   Smoking status: Never   Smokeless tobacco: Never  Vaping Use   Vaping Use: Never used  Substance and Sexual Activity   Alcohol use: No    Alcohol/week: 0.0 standard drinks of alcohol   Drug use: No   Sexual activity: Not on file  Other Topics Concern   Not on file  Social History Narrative   ** Merged History Encounter **    Right handed    Social Determinants of Health   Financial Resource Strain: Not on file  Food Insecurity: Not on file  Transportation Needs: Not on file  Physical Activity: Not on file  Stress: Not on file  Social Connections: Not on file  Intimate Partner Violence: Not on file     BP (!) 160/92   Pulse 66   Ht 5' (1.524 m)   Wt 123 lb (55.8 kg)   SpO2 97%   BMI 24.02 kg/m   Physical Exam:  Well appearing NAD HEENT: Unremarkable Neck:  No JVD, no thyromegally Lymphatics:  No adenopathy Back:  No CVA tenderness Lungs:  Clear with no wheezes HEART:  Regular rate rhythm, no murmurs, no rubs, no clicks Abd:  soft, positive bowel sounds, no organomegally, no rebound, no guarding Ext:  2 plus pulses, no edema, no cyanosis, no clubbing Skin:  No rashes no nodules Neuro:  CN II through XII intact, motor grossly intact  EKG Reviewed. NSR with ILBB   Assess/Plan:  Syncope - she has had recurrent episodes and the etiology is unclear. I have recommended she undergo ILR insertion. I think that she will need to undergo repeat heart monitoring in the setting of recurrent syncope.  I  encouraged her to maintain adequate hydration and salt intake I suspect that she is having long pauses. HTN - her bp is controlled and we will follow. CAD - she had no angina. She will continue her current meds. PAF - she is on systemic anti-coagulation.    Danielle Garrett Danielle Gurganus,MD

## 2022-10-18 ENCOUNTER — Telehealth: Payer: Self-pay | Admitting: *Deleted

## 2022-10-18 MED ORDER — ROSUVASTATIN CALCIUM 20 MG PO TABS: 20.0000 mg | ORAL_TABLET | Freq: Every day | ORAL | 3 refills | Status: DC

## 2022-10-18 NOTE — Telephone Encounter (Signed)
Call placed to pt regarding lab results. See result note.  

## 2022-10-23 DIAGNOSIS — I10 Essential (primary) hypertension: Secondary | ICD-10-CM

## 2022-10-23 MED ORDER — OLMESARTAN MEDOXOMIL 40 MG PO TABS
40.0000 mg | ORAL_TABLET | Freq: Every day | ORAL | 3 refills | Status: DC
Start: 2022-10-23 — End: 2023-12-19

## 2022-10-23 NOTE — Telephone Encounter (Signed)
Please send in a prescription for olmesartan 40 mg daily, #90, RF 3

## 2022-10-24 ENCOUNTER — Telehealth: Payer: Self-pay | Admitting: *Deleted

## 2022-10-24 DIAGNOSIS — E785 Hyperlipidemia, unspecified: Secondary | ICD-10-CM

## 2022-10-24 NOTE — Telephone Encounter (Signed)
See result note.  

## 2022-10-25 ENCOUNTER — Ambulatory Visit: Payer: Medicare Other | Attending: Cardiology | Admitting: *Deleted

## 2022-10-25 DIAGNOSIS — I4891 Unspecified atrial fibrillation: Secondary | ICD-10-CM | POA: Insufficient documentation

## 2022-10-25 DIAGNOSIS — Z5181 Encounter for therapeutic drug level monitoring: Secondary | ICD-10-CM | POA: Insufficient documentation

## 2022-10-25 LAB — POCT INR: POC INR: 4.9

## 2022-10-25 NOTE — Patient Instructions (Signed)
Description   Hold warfarin today and tomorrow. Then continue taking 1 tablet daily.  Stay consistent with greens each week.  Recheck INR in 2 weeks.  [facility] Clinic [phone]

## 2022-10-26 ENCOUNTER — Ambulatory Visit: Payer: Medicare Other | Attending: Physician Assistant | Admitting: Physician Assistant

## 2022-10-26 ENCOUNTER — Encounter: Payer: Self-pay | Admitting: Physician Assistant

## 2022-10-26 VITALS — BP 158/94 | HR 60 | Ht 60.0 in | Wt 122.2 lb

## 2022-10-26 DIAGNOSIS — I2510 Atherosclerotic heart disease of native coronary artery without angina pectoris: Secondary | ICD-10-CM | POA: Diagnosis not present

## 2022-10-26 DIAGNOSIS — R55 Syncope and collapse: Secondary | ICD-10-CM | POA: Insufficient documentation

## 2022-10-26 DIAGNOSIS — I10 Essential (primary) hypertension: Secondary | ICD-10-CM | POA: Insufficient documentation

## 2022-10-26 DIAGNOSIS — I480 Paroxysmal atrial fibrillation: Secondary | ICD-10-CM

## 2022-10-26 NOTE — Progress Notes (Signed)
Cardiology Office Note:  .   Date:  10/26/2022  ID:  Danielle Garrett, DOB [birth year], MRN [id number] PCP: Farris Has, MD  [facility] Providers Cardiologist:  Thurmon Fair, MD Electrophysiologist:  Lewayne Bunting, MD     History of Present Illness: .   Danielle Garrett is a 72 y.o. female with PMH of HTN, LBBB, palpitation and history of neurally mediated syncope.  She had a loop recorder placed in 2019 however removed in 2020.  She had more than 20 years history of syncope associated with prodrome of nausea, weakness and diaphoresis.  No abnormal arrhythmia was detected on loop recorder. She had an episode in January 2023 while helping her friend recover from knee replacement surgery.  She collapsed in the elevator, had ankle fracture and was admitted to the hospital.  She had rapid heart rate on telemetry but no bradycardia.  She had another episode while sitting at [facility] orthopedics on 05/02/2021.  Heart monitor obtained in April 2023 showed normal rhythm, left bundle branch block pattern, no excessive bradycardia or tachycardia, no significant ventricular ectopy.  No arrhythmia to explain her syncope.  Her blood pressure typically runs high, previous office notes suggested we tolerate hypertension to avoid causing significant orthostatic hypotension.  Previous coronary CT in November 2023 showed moderate disease in D1 that felt to have borderline hemodynamic significance on FFR, this was reviewed by Dr. Katrinka Blazing and felt to be nonobstructive. She was started on Eliquis after home ECG obtained in November 2023 revealed A-fib/atrial flutter.  She was later switched to Coumadin in December 2023 due to cost.  Blood pressure medication losartan and metoprolol was switched to olmesartan and carvedilol by clinical pharmacist.  She was seen in the emergency room on 09/25/2022 for hypertensive emergency and chest discomfort.  Blood pressure was in the 197/133.  Serial troponin was negative x 2.   Olmesartan was switched to irbesartan.  Carvedilol was increased to 9.675 mg twice a day.  She was seen by Clair Gulling NP on 10/05/2022 at which time she described one passing out spell in the bathroom shortly after the ED visit.  Her carvedilol was reduced back to 3.125 mg twice a day.  She was seen by Dr. Ladona Ridgel on 10/18/2022 who felt that her episode of feeling bad was directly related to atrial fibrillation.  Dr. Ladona Ridgel recommended she undergo loop recorder insertion again.  Due to the fact that her insurance does not cover irbesartan, she was switched back to olmesartan.  Patient presents today for follow-up.  She is currently scheduled to see Dr. Royann Shivers in December and Dr. Ladona Ridgel in August to have the loop recorder implanted.  Her blood pressure has been running in the 150s at home.  I am hesitant to increase her blood pressure medication too much.  She still think that the reason she passed out recently was because her carvedilol was increased to 3 times the previous dose.  She has since reduced the carvedilol back down to 3.125 mg twice a day.  Recent blood work showed LDL of 78, her Crestor was increased to 20 mg daily.  Overall, she has been feeling well without any recurrent chest pain or syncope.  At this time I do not recommend any further workup for her chest discomfort as she had chest discomfort for several hours during the recent hospitalization and she had 2 negative troponin.  Therefore her chest pain is unlikely to be cardiac.  ROS:   She denies chest pain, palpitations,  dyspnea, pnd, orthopnea, n, v, dizziness, syncope, edema, weight gain, or early satiety. All other systems reviewed and are otherwise negative except as noted above.    Studies Reviewed: .        Cardiac Studies & Procedures       ECHOCARDIOGRAM  ECHOCARDIOGRAM COMPLETE 04/11/2016  Narrative *[facility]* [address] [place], [place]  [address] [phone]  ------------------------------------------------------------------- Transthoracic Echocardiography  Patient:    Danielle, Garrett MR #:       [id number] Study Date: 04/11/2016 Gender:     F Age:        73 Height:     154.9 cm Weight:     54.4 kg BSA:        1.54 m^2 Pt. Status: Room:  Reuben Likes REFERRING    Nada Boozer R SONOGRAPHER  Aida Raider, RDCS ATTENDING    Zoila Shutter MD PERFORMING   [facility] Outpatient  cc:  ------------------------------------------------------------------- LV EF: 60% -   65%  ------------------------------------------------------------------- Indications:      R55 Syncope.  ------------------------------------------------------------------- History:   PMH:  Acquired from the patient and from the patient&'s chart.  PMH:  Left Bundle Branch Block. Anxiety.  Risk factors: Hypertension.  ------------------------------------------------------------------- Study Conclusions  - Left ventricle: The cavity size was normal. Wall thickness was increased in a pattern of mild LVH. Systolic function was normal. The estimated ejection fraction was in the range of 60% to 65%. Wall motion was normal; there were no regional wall motion abnormalities. Doppler parameters are consistent with abnormal left ventricular relaxation (grade 1 diastolic dysfunction). The E/e&' ratio is between 8-15, suggesting indeterminate LV filling pressure. - Mitral valve: Mildly thickened leaflets . There was trivial regurgitation. - Left atrium: The atrium was normal in size. - Tricuspid valve: There was mild regurgitation. - Pulmonary arteries: PA peak pressure: 33 mm Hg (S). - Inferior vena cava: The vessel was normal in size. The respirophasic diameter changes were in the normal range (>= 50%), consistent with normal central venous pressure.  Impressions:  - LVEF 60-65%, mild LVH, normal wall motion, diastolic  dysfunction, indeterminate LV filling pressure, normal LA size, mild TR, RVSP 33 mmHg, normal IVC.  ------------------------------------------------------------------- Study data:   Study status:  Routine.  Procedure:  The patient reported no pain pre or post test. Transthoracic echocardiography for left ventricular function evaluation, for right ventricular function evaluation, and for assessment of valvular function. Image quality was adequate.  Study completion:  There were no complications.          Transthoracic echocardiography.  M-mode, complete 2D, spectral Doppler, and color Doppler.  Birthdate: Patient birthdate: 1950/05/02.  Age:  Patient is 72 yr old.  Sex: Gender: female.    BMI: 22.7 kg/m^2.  Blood pressure:     160/82 Patient status:  Outpatient.  Study date:  Study date: 04/11/2016. Study time: 04:14 PM.  Location:  [facility]  -------------------------------------------------------------------  ------------------------------------------------------------------- Left ventricle:  The cavity size was normal. Wall thickness was increased in a pattern of mild LVH. Systolic function was normal. The estimated ejection fraction was in the range of 60% to 65%. Wall motion was normal; there were no regional wall motion abnormalities. Doppler parameters are consistent with abnormal left ventricular relaxation (grade 1 diastolic dysfunction). The E/e&' ratio is between 8-15, suggesting indeterminate LV filling pressure.  ------------------------------------------------------------------- Aortic valve:   Structurally normal valve. Trileaflet. Cusp separation was normal.  Doppler:  Transvalvular velocity was within  the normal range. There was no stenosis. There was no regurgitation.  ------------------------------------------------------------------- Aorta:  Aortic root: The aortic root was normal in size. Ascending aorta: The ascending aorta was normal in  size.  ------------------------------------------------------------------- Mitral valve:   Mildly thickened leaflets .  Doppler:  There was trivial regurgitation.  ------------------------------------------------------------------- Left atrium:  The atrium was normal in size.  ------------------------------------------------------------------- Atrial septum:  No defect or patent foramen ovale was identified.  ------------------------------------------------------------------- Right ventricle:  The cavity size was normal. Wall thickness was normal. Systolic function was normal.  ------------------------------------------------------------------- Pulmonic valve:    The valve appears to be grossly normal. Doppler:  There was no significant regurgitation.  ------------------------------------------------------------------- Tricuspid valve:   Doppler:  There was mild regurgitation.  ------------------------------------------------------------------- Pulmonary artery:   The main pulmonary artery was normal-sized.  ------------------------------------------------------------------- Right atrium:  The atrium was normal in size.  ------------------------------------------------------------------- Pericardium:  There was no pericardial effusion.  ------------------------------------------------------------------- Systemic veins: Inferior vena cava: The vessel was normal in size. The respirophasic diameter changes were in the normal range (>= 50%), consistent with normal central venous pressure.  ------------------------------------------------------------------- Measurements  Left ventricle                         Value        Reference LV ID, ED, PLAX chordal        (L)     30    mm     43 - 52 LV ID, ES, PLAX chordal        (L)     21.3  mm     23 - 38 LV fx shortening, PLAX chordal         29    %      >=29 LV PW thickness, ED                    10.2  mm     --------- IVS/LV PW  ratio, ED                    1.14         <=1.3 Stroke volume, 2D                      47    ml     --------- Stroke volume/bsa, 2D                  31    ml/m^2 --------- LV e&', lateral                         7.9   cm/s   --------- LV E/e&', lateral                       8.28         --------- LV e&', medial                          6.19  cm/s   --------- LV E/e&', medial                        10.57        --------- LV e&', average                         7.05  cm/s   ---------  LV E/e&', average                       9.28         ---------  Ventricular septum                     Value        Reference IVS thickness, ED                      11.6  mm     ---------  LVOT                                   Value        Reference LVOT ID, S                             18    mm     --------- LVOT area                              2.54  cm^2   --------- LVOT ID                                18    mm     --------- LVOT peak velocity, S                  83.1  cm/s   --------- LVOT mean velocity, S                  61.9  cm/s   --------- LVOT VTI, S                            18.5  cm     --------- LVOT peak gradient, S                  3     mm Hg  --------- Stroke volume (SV), LVOT DP            47.1  ml     --------- Stroke index (SV/bsa), LVOT DP         30.6  ml/m^2 ---------  Aorta                                  Value        Reference Aortic root ID, ED                     29    mm     --------- Ascending aorta ID, A-P, S             32    mm     ---------  Left atrium                            Value        Reference LA ID, A-P, ES  31    mm     --------- LA ID/bsa, A-P                         2.02  cm/m^2 <=2.2 LA volume, S                           33    ml     --------- LA volume/bsa, S                       21.5  ml/m^2 --------- LA volume, ES, 1-p A4C                 26    ml     --------- LA volume/bsa, ES, 1-p A4C             16.9  ml/m^2  --------- LA volume, ES, 1-p A2C                 38    ml     --------- LA volume/bsa, ES, 1-p A2C             24.7  ml/m^2 ---------  Mitral valve                           Value        Reference Mitral E-wave peak velocity            65.4  cm/s   --------- Mitral A-wave peak velocity            69.7  cm/s   --------- Mitral deceleration time       (H)     285   ms     150 - 230 Mitral E/A ratio, peak                 0.9          ---------  Pulmonary arteries                     Value        Reference PA pressure, S, DP             (H)     33    mm Hg  <=30  Tricuspid valve                        Value        Reference Tricuspid regurg peak velocity         277   cm/s   --------- Tricuspid peak RV-RA gradient          31    mm Hg  ---------  Right ventricle                        Value        Reference RV s&', lateral, S                      13    cm/s   ---------  Legend: (L)  and  (H)  mark values outside specified reference range.  ------------------------------------------------------------------- Prepared and Electronically Authenticated by  Zoila Shutter MD 2017-12-26T17:03:53    MONITORS  CARDIAC EVENT MONITOR 08/02/2021  Narrative  Basic rhythm is NSR with BBB pattern  No excessive bradtcardia or significant tachycardia  No ventricular tachycardia  Overall, no findings to explain syncope.   CT SCANS  CT CORONARY MORPH W/CTA COR W/SCORE 03/06/2022  Addendum 03/06/2022  2:41 PM ADDENDUM REPORT: 03/06/2022 14:39  ADDENDUM: OVER-READ INTERPRETATION  CT CHEST  The following report is an over-read performed by radiologist Dr. Lovie Chol [facility] on 03/06/2022. This over-read does not include interpretation of cardiac or coronary anatomy or pathology. The coronary calcium and coronary CT angiography interpretation by the cardiologist is attached. Imaging of the chest is focused on cardiac structures and excludes much of the chest on  CT.  COMPARISON:  None available.  FINDINGS:  Cardiovascular: Please see dedicated report for cardiovascular details.  Mediastinum/Nodes: No adenopathy or acute process in the mediastinum.  Lungs/Pleura: Mild basilar atelectasis. No effusion. No consolidative changes.  Upper Abdomen: Incidental imaging of upper abdominal contents without acute process.  Musculoskeletal: No acute or destructive bone findings.  IMPRESSION:  No acute or significant extracardiac findings.   Electronically Signed By: Donzetta Kohut M.D. On: 03/06/2022 14:39  Narrative CLINICAL DATA:  Chest pain  EXAM: Cardiac CTA  MEDICATIONS: Sub lingual nitro. 4mg  x 2  TECHNIQUE: The patient was scanned on a Siemens 192 slice scanner. Gantry rotation speed was 250 msecs. Collimation was 0.6 mm. A 100 kV prospective scan was triggered in the ascending thoracic aorta at 35-75% of the R-R interval. Average HR during the scan was 50 bpm. The 3D data set was interpreted on a dedicated work station using MPR, MIP and VRT modes. A total of 80cc of contrast was used.  FINDINGS: Non-cardiac: See separate report from [facility].  Pulmonary veins drain normally to the left atrium. No LA appendage thrombus.  Calcium Score: 207 Agatston units.  Coronary Arteries: Right dominant with no anomalies  LM: No plaque or stenosis.  LAD system: Noncalcified plaque proximal LAD with mild (25-49%) stenosis, FFR 0.9 in the mid LAD. Large D1 branch with mixed plaque, suspect moderate (50-69%) stenosis proximally, FFR 0.8 in the mid D1 is borderline for hemodynamic significance.  Circumflex system: No plaque or stenosis.  RCA system: Mixed plaque proximally, minimal stenosis.  IMPRESSION: 1. Coronary artery calcium score 207 Agatston units. This places the patient in the 78th percentile for age and gender, suggesting intermediate risk for future cardiac events.  2.  Moderate D1 stenosis of  borderline hemodynamic significance.  Unless symptoms are significant, suspect medical management would be appropriate.  [employer] account executive  Electronically Signed: By: Marca Ancona M.D. On: 03/06/2022 12:57          Risk Assessment/Calculations:    CHA2DS2-VASc Score = 3   This indicates a 3.2% annual risk of stroke. The patient's score is based upon: CHF History: 0 HTN History: 1 Diabetes History: 0 Stroke History: 0 Vascular Disease History: 0 Age Score: 1 Gender Score: 1           Physical Exam:   VS:  BP (!) 158/94   Pulse 60   Ht 5' (1.524 m)   Wt 122 lb 3.2 oz (55.4 kg)   SpO2 94%   BMI 23.87 kg/m    Wt Readings from Last 3 Encounters:  10/26/22 122 lb 3.2 oz (55.4 kg)  10/17/22 123 lb (55.8 kg)  10/05/22 123 lb 9.6 oz (56.1 kg)    GEN: Well nourished, well developed in no acute distress NECK: No JVD; No carotid bruits CARDIAC: RRR, no murmurs, rubs, gallops RESPIRATORY:  Clear to  auscultation without rales, wheezing or rhonchi  ABDOMEN: Soft, non-tender, non-distended EXTREMITIES:  No edema; No deformity   ASSESSMENT AND PLAN: .    Recurrent syncope: Pending loop recorder implantation.  Well-tolerated borderline elevated blood pressure for now.  She attributed the most recent episode of syncope to increase carvedilol dosage, her carvedilol has since been reduced back down to 3.125 mg twice a day.  Hypertension: Blood pressure elevated today, however patient attributed her most recent syncope to increase carvedilol dosage from 3.125 mg twice a day to 9.375 mg twice a day.  She has since reduced the dose of carvedilol back down to 3.125 mg twice a day.  CAD: Previous coronary CT obtained in November 2023 demonstrated moderate disease in D1 that had borderline significance on FFR, this was reviewed and felt to be nonobstructive.  Patient denies any recent chest pain  PAF:: On Coumadin       Dispo: Follow-up with Dr. Ladona Ridgel and Dr. Royann Shivers as previously  scheduled.  Signed, Azalee Course, PA

## 2022-10-26 NOTE — Patient Instructions (Signed)
Medication Instructions:  Your physician recommends that you continue on your current medications as directed. Please refer to the Current Medication list given to you today.   *If you need a refill on your cardiac medications before your next appointment, please call your pharmacy*   Lab Work: None ordered  If you have labs (blood work) drawn today and your tests are completely normal, you will receive your results only by: MyChart Message (if you have MyChart) OR A paper copy in the mail If you have any lab test that is abnormal or we need to change your treatment, we will call you to review the results.   Testing/Procedures: None ordered   Follow-Up: At [facility], you and your health needs are our priority.  As part of our continuing mission to provide you with exceptional heart care, we have created designated Provider Care Teams.  These Care Teams include your primary Cardiologist (physician) and Advanced Practice Providers (APPs -  Physician Assistants and Nurse Practitioners) who all work together to provide you with the care you need, when you need it.  We recommend signing up for the patient portal called "MyChart".  Sign up information is provided on this After Visit Summary.  MyChart is used to connect with patients for Virtual Visits (Telemedicine).  Patients are able to view lab/test results, encounter notes, upcoming appointments, etc.  Non-urgent messages can be sent to your provider as well.   To learn more about what you can do with MyChart, go to [email or web address].    Your next appointment:   Keep both appointments as scheduled  Provider:       Other Instructions

## 2022-11-06 ENCOUNTER — Ambulatory Visit: Payer: Medicare Other | Admitting: Cardiovascular Disease

## 2022-11-08 ENCOUNTER — Ambulatory Visit: Payer: Medicare Other | Admitting: *Deleted

## 2022-11-08 DIAGNOSIS — I4891 Unspecified atrial fibrillation: Secondary | ICD-10-CM

## 2022-11-08 DIAGNOSIS — Z5181 Encounter for therapeutic drug level monitoring: Secondary | ICD-10-CM | POA: Diagnosis not present

## 2022-11-08 DIAGNOSIS — I480 Paroxysmal atrial fibrillation: Secondary | ICD-10-CM | POA: Diagnosis not present

## 2022-11-08 LAB — POCT INR: INR: 4.4 — AB (ref 2.0–3.0)

## 2022-11-08 NOTE — Patient Instructions (Addendum)
Description   Do not take any warfarin tomorrow (already taken today's dose) and no warfarin Friday then START taking 1 tablet daily except 1/2 tablet on Mondays. Stay consistent with greens each week.  Recheck INR in 2 weeks. Anticoagulation Clinic [phone]

## 2022-11-22 ENCOUNTER — Ambulatory Visit: Payer: Medicare Other | Attending: Internal Medicine | Admitting: *Deleted

## 2022-11-22 DIAGNOSIS — I4891 Unspecified atrial fibrillation: Secondary | ICD-10-CM | POA: Diagnosis not present

## 2022-11-22 DIAGNOSIS — Z5181 Encounter for therapeutic drug level monitoring: Secondary | ICD-10-CM | POA: Diagnosis not present

## 2022-11-22 DIAGNOSIS — I480 Paroxysmal atrial fibrillation: Secondary | ICD-10-CM | POA: Diagnosis not present

## 2022-11-22 LAB — POCT INR: INR: 2.5 (ref 2.0–3.0)

## 2022-11-22 NOTE — Patient Instructions (Signed)
Description   Continue taking warfarin 1 tablet daily except 1/2 tablet on Mondays. Stay consistent with greens each week. Recheck INR in 3 weeks. Anticoagulation Clinic [phone]

## 2022-11-28 ENCOUNTER — Ambulatory Visit: Payer: Medicare Other | Attending: Internal Medicine | Admitting: Internal Medicine

## 2022-11-28 ENCOUNTER — Encounter: Payer: Self-pay | Admitting: Internal Medicine

## 2022-11-28 VITALS — BP 148/88 | HR 74 | Ht 60.0 in | Wt 120.2 lb

## 2022-11-28 DIAGNOSIS — R55 Syncope and collapse: Secondary | ICD-10-CM | POA: Diagnosis not present

## 2022-11-28 NOTE — Progress Notes (Signed)
HPI Danielle Garrett returns today for followup. She is a pleasant 72 yo woman with a h/o HTN, and long standing neurally mediated syncope. I saw her back in June. She worn a cardiac monitor which did not show any pauses or rapid heart beats. She has left bundle branch block. She has passed out since I saw while she was int the shower. She has known atrial fib but does not feel palpitations but I suspect that her episodes of feeling badly are due to atrial fib. She was prescribed systemic anti-coagulation.  She has non-cardiac chest pain and is anxious after a CT scan showed non-obstructive CAD. Today she presents to consider ILR insertion due to unexplained syncope.  Allergies  Allergen Reactions   Hydrochlorothiazide Nausea Only   Penicillins Other (See Comments)    Unknown Other reaction(s): Other (See Comments) Pt was a child   Adhesive [Tape]     Pt states ' it took my skin off' Steri-Strips   Atorvastatin Calcium     Other reaction(s): insomnia   Codeine Nausea And Vomiting   Fentanyl Nausea And Vomiting   Sulfa Antibiotics Nausea And Vomiting   Trimethoprim    Vicodin [Hydrocodone-Acetaminophen] Nausea And Vomiting and Other (See Comments)    Did not feel like herself     Current Outpatient Medications  Medication Sig Dispense Refill   albuterol (PROVENTIL HFA;VENTOLIN HFA) 108 (90 Base) MCG/ACT inhaler Inhale 1-2 puffs into the lungs every 4 (four) hours as needed for wheezing or shortness of breath.     calcium-vitamin D (OSCAL WITH D) 500-5 MG-MCG tablet Take 1 tablet by mouth daily.     carvedilol (COREG) 3.125 MG tablet Take 3 tablets (9.375 mg total) by mouth 2 (two) times daily. 60 tablet 1   fluticasone (FLONASE) 50 MCG/ACT nasal spray Place 2 sprays into both nostrils daily as needed for allergies or rhinitis.     loratadine (CLARITIN) 10 MG tablet Take 10 mg by mouth daily as needed for allergies.     Multiple Vitamins-Minerals (CENTRUM SILVER 50+WOMEN PO) Take 1  tablet by mouth daily.     olmesartan (BENICAR) 40 MG tablet Take 1 tablet (40 mg total) by mouth daily. 90 tablet 3   rosuvastatin (CRESTOR) 20 MG tablet Take 1 tablet (20 mg total) by mouth daily. 90 tablet 3   warfarin (COUMADIN) 5 MG tablet Take 1 tablet (5 mg total) by mouth as directed. (Patient taking differently: Take 5 mg by mouth daily.) 90 tablet 3   gabapentin (NEURONTIN) 300 MG capsule Take 300 mg by mouth daily as needed (pain).     No current facility-administered medications for this visit.     Past Medical History:  Diagnosis Date   Allergic rhinitis due to other allergen    Anginal pain (HCC) 2011   hospitalized for CP in the past; due to low potassium   Anxiety    Arthritis    Childhood asthma    Chronic hoarseness    Gastroparesis    GERD (gastroesophageal reflux disease)    History of hiatal hernia    Hx of blood transfusion reaction    in the early 80s   Hypertension    Left bundle branch block     (abnormal heart rhythm)   PONV (postoperative nausea and vomiting)    Pyloric stenosis     ROS:   All systems reviewed and negative except as noted in the HPI.   Past Surgical History:  Procedure  Laterality Date   ABDOMINAL HYSTERECTOMY  1983   CARDIAC CATHETERIZATION  10/2009   Cardiac cath normal coronary arteries   CERVICAL DISC ARTHROPLASTY     disc replacement in neck 1998   COLONOSCOPY     10 year repeat 09/26/2010   ESOPHAGOGASTRODUODENOSCOPY  09/26/2010,01/08/13   LOOP RECORDER INSERTION N/A 05/29/2016   Procedure: Loop Recorder Insertion;  Surgeon: Marinus Maw, MD;  Location: [facility];  Service: Cardiovascular;  Laterality: N/A;   OTHER SURGICAL HISTORY  1983   hysterectomy ovaries intact, prolapsed    OTHER SURGICAL HISTORY  2006   pyloric stricture surgery   TOTAL HIP ARTHROPLASTY Right 06/07/2015   Procedure: TOTAL HIP ARTHROPLASTY ANTERIOR APPROACH;  Surgeon: Gean Birchwood, MD;  Location: [facility] OR;  Service: Orthopedics;  Laterality:  Right;     Family History  Problem Relation Age of Onset   Hypertension Mother    Cirrhosis Father    Hypertension Father    OCD Sister    Depression Sister    Depression Sister    Hypertension Sister      Social History   Socioeconomic History   Marital status: Married    Spouse name: Not on file   Number of children: Not on file   Years of education: Not on file   Highest education level: Not on file  Occupational History   Not on file  Tobacco Use   Smoking status: Never   Smokeless tobacco: Never  Vaping Use   Vaping status: Never Used  Substance and Sexual Activity   Alcohol use: No    Alcohol/week: 0.0 standard drinks of alcohol   Drug use: No   Sexual activity: Not on file  Other Topics Concern   Not on file  Social History Narrative   ** Merged History Encounter **    Right handed    Social Determinants of Health   Financial Resource Strain: Not on file  Food Insecurity: Not on file  Transportation Needs: Not on file  Physical Activity: Not on file  Stress: Not on file  Social Connections: Not on file  Intimate Partner Violence: Not on file     BP (!) 148/88   Pulse 74   Ht 5' (1.524 m)   Wt 120 lb 3.2 oz (54.5 kg)   SpO2 99%   BMI 23.47 kg/m   Physical Exam:  Well appearing NAD HEENT: Unremarkable Neck:  No JVD, no thyromegally Lymphatics:  No adenopathy Back:  No CVA tenderness Lungs:  Clear with no wheezes HEART:  Regular rate rhythm, no murmurs, no rubs, no clicks Abd:  soft, positive bowel sounds, no organomegally, no rebound, no guarding Ext:  2 plus pulses, no edema, no cyanosis, no clubbing Skin:  No rashes no nodules Neuro:  CN II through XII intact, motor grossly intact   Assess/Plan:  Syncope - she has had recurrent episodes and the etiology is unclear. I have recommended she undergo ILR insertion. I encouraged her to maintain adequate hydration and salt intake I suspect that she is having long pauses. HTN - her bp is  controlled and we will follow. CAD - she had no angina. She will continue her current meds. PAF - she is on systemic anti-coagulation.    Danielle Gowda Katalena Malveaux,MD    SURGEON:  Lewayne Bunting, MD     PREPROCEDURE DIAGNOSIS:  Syncope    POSTPROCEDURE DIAGNOSIS: Syncope     PROCEDURES:   1. Implantable loop recorder implantation    INTRODUCTION:  Danielle Garrett presents with a history of syncope The costs of loop recorder monitoring have been discussed with the patient.    DESCRIPTION OF PROCEDURE:  Informed written consent was obtained.   Time Out Completed with RN    The patient required no sedation for the procedure today.  Mapping over the patient's chest was performed to identify the area where electrograms were most prominent for ILR recording.  This area was found to be the left parasternal region over the 4th intercostal space. The patients left chest was therefore prepped and draped in the usual sterile fashion. The skin overlying the left parasternal region was infiltrated with lidocaine for local analgesia.  A 0.5-cm incision was made over the left parasternal region over the 3rd intercostal space.  A subcutaneous ILR pocket was fashioned using a combination of sharp and blunt dissection.  A Medtronic Reveal LINQ 2 implantable loop recorder (serial # [id number]) was then placed into the pocket  R waves were very prominent and measuring  0.76mV .  Steri- Strips and a sterile dressing were then applied.  There were no early apparent complications.     CONCLUSIONS:   1. Successful implantation of a implantable loop recorder for a history of Syncope.  2. No early apparent complications.   Lewayne Bunting, MD

## 2022-11-28 NOTE — Patient Instructions (Addendum)
Medication Instructions:  Your physician recommends that you continue on your current medications as directed. Please refer to the Current Medication list given to you today.  Labwork: None ordered.  Testing/Procedures: None ordered.  Follow-Up:  Wound check in 7 days at our office 8/22 at 3:20pm  Your physician wants you to follow-up in: 6 month with Dr Ladona Ridgel.  You will receive a reminder letter in the mail two months in advance. If you don't receive a letter, please call our office to schedule the follow-up appointment.    Implantable Loop Recorder Placement, Care After This sheet gives you information about how to care for yourself after your procedure. Your health care provider may also give you more specific instructions. If you have problems or questions, contact your health care provider. What can I expect after the procedure? After the procedure, it is common to have: Soreness or discomfort near the incision. Some swelling or bruising near the incision.  Follow these instructions at home: Incision care  Monitor your cardiac device site for redness, swelling, and drainage. Call the device clinic at [phone] if you experience these symptoms or fever/chills.  Keep the large square bandage on your site for 24 hours and then you may remove it yourself. Keep the steri-strips underneath in place.   You may shower after 72 hours / 3 days from your procedure with the steri-strips in place. They will usually fall off on their own, or may be removed after 10 days. Pat dry.   Avoid lotions, ointments, or perfumes over your incision until it is well-healed.  Please do not submerge in water until your site is completely healed.   Your device is MRI compatible.   Remote monitoring is used to monitor your cardiac device from home. This monitoring is scheduled every month by our office. It allows [place] to keep an eye on the function of your device to ensure it is working  properly.  If your wound site starts to bleed apply pressure.    For help with the monitor please call Med[employer] Monitor Support Specialist directly at [phone].    If you have any questions/concerns please call the device clinic at [phone].  Activity  Return to your normal activities.  General instructions Follow instructions from your health care provider about how to manage your implantable loop recorder and transmit the information. Learn how to activate a recording if this is necessary for your type of device. You may go through a metal detection gate, and you may let someone hold a metal detector over your chest. Show your ID card if needed. Do not have an MRI unless you check with your health care provider first. Take over-the-counter and prescription medicines only as told by your health care provider. Keep all follow-up visits as told by your health care provider. This is important. Contact a health care provider if: You have redness, swelling, or pain around your incision. You have a fever. You have pain that is not relieved by your pain medicine. You have triggered your device because of fainting (syncope) or because of a heartbeat that feels like it is racing, slow, fluttering, or skipping (palpitations). Get help right away if you have: Chest pain. Difficulty breathing. Summary After the procedure, it is common to have soreness or discomfort near the incision. Change your dressing as told by your health care provider. Follow instructions from your health care provider about how to manage your implantable loop recorder and transmit the information. Keep all follow-up visits  as told by your health care provider. This is important. This information is not intended to replace advice given to you by your health care provider. Make sure you discuss any questions you have with your health care provider. Document Released: 03/15/2015 Document Revised: 05/19/2017 Document  Reviewed: 05/19/2017 Elsevier Patient Education  2020 Elsevier Inc.]

## 2022-11-29 NOTE — Progress Notes (Unsigned)
Cardiology Office Note Date:  11/29/2022  Patient ID:  Danielle Garrett, Danielle Garrett 1950-10-09, MRN [id number] PCP:  Farris Has, MD  Cardiologist:  Dr. Royann Shivers Electrophysiologist: Dr. Ladona Ridgel  ***refresh   Chief Complaint: *** loop wound check  History of Present Illness: Danielle Garrett is a 72 y.o. 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  *** site *** symptoms?  Device information MDT LINQ II, implanted 11/28/22   Past Medical History:  Diagnosis Date   Allergic rhinitis due to other allergen    Anginal pain (HCC) 2011   hospitalized for CP in the past; due to low potassium   Anxiety    Arthritis    Childhood asthma    Chronic hoarseness    Gastroparesis    GERD (gastroesophageal reflux disease)    History of hiatal hernia    Hx of blood transfusion reaction    in the early 80s   Hypertension    Left bundle branch block     (abnormal heart rhythm)   PONV (postoperative nausea and vomiting)    Pyloric stenosis     Past Surgical History:  Procedure Laterality Date   ABDOMINAL HYSTERECTOMY  1983   CARDIAC CATHETERIZATION  10/2009   Cardiac cath normal coronary arteries   CERVICAL DISC ARTHROPLASTY     disc replacement in neck 1998   COLONOSCOPY     10 year repeat 09/26/2010   ESOPHAGOGASTRODUODENOSCOPY  09/26/2010,01/08/13   LOOP RECORDER INSERTION N/A 05/29/2016   Procedure: Loop Recorder Insertion;  Surgeon: Marinus Maw, MD;  Location: [facility];  Service: Cardiovascular;  Laterality: N/A;   OTHER SURGICAL  HISTORY  1983   hysterectomy ovaries intact, prolapsed    OTHER SURGICAL HISTORY  2006   pyloric stricture surgery   TOTAL HIP ARTHROPLASTY Right 06/07/2015   Procedure: TOTAL HIP ARTHROPLASTY ANTERIOR APPROACH;  Surgeon: Gean Birchwood, MD;  Location: [facility];  Service: Orthopedics;  Laterality: Right;    Current Outpatient Medications  Medication Sig Dispense Refill   albuterol (PROVENTIL HFA;VENTOLIN HFA) 108 (90 Base) MCG/ACT inhaler Inhale 1-2 puffs into the lungs every 4 (four) hours as needed for wheezing or shortness of breath.     calcium-vitamin D (OSCAL WITH D) 500-5 MG-MCG tablet Take 1 tablet by mouth daily.     carvedilol (COREG) 3.125 MG tablet Take 3 tablets (9.375 mg total) by mouth 2 (two) times daily. 60 tablet 1   fluticasone (FLONASE) 50 MCG/ACT nasal spray Place 2 sprays into both nostrils daily as needed for allergies or rhinitis.     gabapentin (NEURONTIN) 300 MG capsule Take 300 mg by mouth daily as needed (pain).     loratadine (CLARITIN) 10 MG tablet Take 10 mg by mouth daily as needed for allergies.     Multiple Vitamins-Minerals (CENTRUM SILVER 50+WOMEN PO) Take 1 tablet by mouth daily.     olmesartan (BENICAR) 40 MG tablet Take 1 tablet (40 mg total) by mouth daily. 90 tablet 3   rosuvastatin (CRESTOR) 20 MG tablet Take 1  tablet (20 mg total) by mouth daily. 90 tablet 3   warfarin (COUMADIN) 5 MG tablet Take 1 tablet (5 mg total) by mouth as directed. (Patient taking differently: Take 5 mg by mouth daily.) 90 tablet 3   No current facility-administered medications for this visit.    Allergies:   Hydrochlorothiazide, Penicillins, Adhesive [tape], Atorvastatin calcium, Codeine, Fentanyl, Sulfa antibiotics, Trimethoprim, and Vicodin [hydrocodone-acetaminophen]   Social History:  The patient  reports that she has never smoked. She has never used smokeless tobacco. She reports that she does not drink alcohol and does not use drugs.   Family History:  The patient's  family history includes Cirrhosis in her father; Depression in her sister and sister; Hypertension in her father, mother, and sister; OCD in her sister.  ROS:  Please see the history of present illness.    All other systems are reviewed and otherwise negative.   PHYSICAL EXAM:  VS:  There were no vitals taken for this visit. BMI: There is no height or weight on file to calculate BMI. Well nourished, well developed, in no acute distress HEENT: normocephalic, atraumatic Neck: no JVD, carotid bruits or masses Cardiac:  *** RRR; no significant murmurs, no rubs, or gallops Lungs:  *** CTA b/l, no wheezing, rhonchi or rales Abd: soft, nontender MS: no deformity or *** atrophy Ext: *** no edema Skin: warm and dry, no rash Neuro:  No gross deficits appreciated Psych: euthymic mood, full affect  *** ILR site is *** stable, no tethering or discomfort   EKG:  not done today  Device interrogation done today and reviewed by myself:  ***  Recent Labs: 03/13/2022: TSH 1.240 09/25/2022: BUN 10; Creatinine, Ser 0.87; Hemoglobin 13.7; Platelets 246; Potassium 3.9; Sodium 134 10/17/2022: ALT 18  10/17/2022: Chol/HDL Ratio 2.3; Cholesterol, Total 163; HDL 71; LDL Chol Calc (NIH) 78; Triglycerides 71   CrCl cannot be calculated (Patient's most recent lab result is older than the maximum 21 days allowed.).   Wt Readings from Last 3 Encounters:  11/28/22 120 lb 3.2 oz (54.5 kg)  10/26/22 122 lb 3.2 oz (55.4 kg)  10/17/22 123 lb (55.8 kg)     Other studies reviewed: Additional studies/records reviewed today include: summarized above  ASSESSMENT AND PLAN:  ILR wound check ***  Disposition: F/u with ***  Current medicines are reviewed at length with the patient today.  The patient did not have any concerns regarding medicines.  Danielle Fredrickson, PA-C 11/29/2022 7:33 AM     [facility] [address] [place] [address] [phone] (office)  [phone]  (fax)

## 2022-11-30 ENCOUNTER — Encounter: Payer: Self-pay | Admitting: Physician Assistant

## 2022-11-30 ENCOUNTER — Ambulatory Visit: Payer: Medicare Other | Attending: Physician Assistant | Admitting: Physician Assistant

## 2022-11-30 VITALS — BP 146/88 | HR 72 | Ht 60.0 in | Wt 120.2 lb

## 2022-11-30 DIAGNOSIS — R55 Syncope and collapse: Secondary | ICD-10-CM | POA: Diagnosis not present

## 2022-11-30 DIAGNOSIS — Z4509 Encounter for adjustment and management of other cardiac device: Secondary | ICD-10-CM

## 2022-11-30 DIAGNOSIS — Z5189 Encounter for other specified aftercare: Secondary | ICD-10-CM | POA: Diagnosis not present

## 2022-11-30 NOTE — Patient Instructions (Signed)
Medication Instructions:  Your physician has recommended you make the following change in your medication:  STOP Coreg until next appointment.  *If you need a refill on your cardiac medications before your next appointment, please call your pharmacy*   Lab Work: None  If you have labs (blood work) drawn today and your tests are completely normal, you will receive your results only by: MyChart Message (if you have MyChart) OR A paper copy in the mail If you have any lab test that is abnormal or we need to change your treatment, we will call you to review the results.   Testing/Procedures: None   Follow-Up: At [facility], you and your health needs are our priority.  As part of our continuing mission to provide you with exceptional heart care, we have created designated Provider Care Teams.  These Care Teams include your primary Cardiologist (physician) and Advanced Practice Providers (APPs -  Physician Assistants and Nurse Practitioners) who all work together to provide you with the care you need, when you need it.  We recommend signing up for the patient portal called "MyChart".  Sign up information is provided on this After Visit Summary.  MyChart is used to connect with patients for Virtual Visits (Telemedicine).  Patients are able to view lab/test results, encounter notes, upcoming appointments, etc.  Non-urgent messages can be sent to your provider as well.   To learn more about what you can do with MyChart, go to [email or web address].    Your next appointment:   2 week(s)  Provider:   Francis Dowse, PA-C {If Card or EP not listed click to update   DO NOT delete brackets or number around this link

## 2022-12-07 ENCOUNTER — Ambulatory Visit: Payer: Medicare Other

## 2022-12-12 ENCOUNTER — Ambulatory Visit: Payer: Medicare Other | Attending: Cardiovascular Disease | Admitting: *Deleted

## 2022-12-12 DIAGNOSIS — I4891 Unspecified atrial fibrillation: Secondary | ICD-10-CM | POA: Insufficient documentation

## 2022-12-12 DIAGNOSIS — Z5181 Encounter for therapeutic drug level monitoring: Secondary | ICD-10-CM | POA: Diagnosis not present

## 2022-12-12 LAB — POCT INR: POC INR: 3.9

## 2022-12-12 NOTE — Patient Instructions (Signed)
Description   Hold warfarin today and then Continue taking warfarin 1 tablet daily except 1/2 tablet on Mondays. Stay consistent with greens each week. Recheck INR in 2 weeks. Anticoagulation Clinic [phone]

## 2022-12-14 NOTE — Progress Notes (Signed)
Cardiology Office Note Date:  12/14/2022  Patient ID:  Danielle Garrett, Danielle Garrett 02-03-1951, MRN [id number] PCP:  Farris Has, MD  Cardiologist:  Dr. Royann Shivers Electrophysiologist: Dr. Ladona Ridgel    Chief Complaint:  2 week   History of Present Illness: Danielle Garrett is a 72 y.o. 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  I saw her 11/30/22 She is doing well No syncope, reports her fainting is rare, every couple years No CP, palpitations, cardiac awareness Her skin itches a little by the loop/dressing, but nearly as bad as usual Wound edges not quite healed, otherwise looked good, skin also looked good Very small steri strips re-applied and care instructions given Incidentally:  Interestingly: Her presenting rhythm, appeared to be 2:1 AVblock (V rate 70) An EKG done (not simultaneously) no block was appreciated I did then do a live loop tracing and EKG then at the same time Again Loop appeared to be 2:1 but 12 lead EKG did not I reviewed with Dr. Lalla Brothers EKGs show no evidence of AVblock Loop signal appeared an anomaly No changes to her coreg recommended  TODAY No site concerns, day iof steri strip removal she did take them off and only that day did she start to have some itching. She will infrequently feel briefly lightheaded upon standing Her BP is persistently elevated usually 150/90 or so. She had been on different meds, has had issues with syncope and then issues with low sodium and the current meds is where she landed.  No  syncope. No CP Some palpitations, once felt her heart beating strong and made a symptom episode, no associated symptoms but felt it beating hard/fast, was at rest.  She notes her watch will show steady HR then suddenly a 40 or a 150, no symptoms, not sustained low or fats rates with these  No bleeding or signs of bleeding   Device information MDT LINQ II, implanted 11/28/22   Past Medical History:  Diagnosis Date   Allergic rhinitis due to other allergen    Anginal pain (HCC) 2011   hospitalized for CP in the past; due to low potassium   Anxiety    Arthritis    Childhood asthma    Chronic hoarseness    Gastroparesis    GERD (gastroesophageal reflux disease)    History of hiatal hernia    Hx of blood transfusion reaction    in the early 80s   Hypertension    Left bundle branch block     (abnormal heart rhythm)   PONV (postoperative nausea and vomiting)    Pyloric stenosis     Past Surgical History:  Procedure Laterality Date   ABDOMINAL HYSTERECTOMY  1983   CARDIAC CATHETERIZATION  10/2009   Cardiac cath normal coronary arteries   CERVICAL DISC ARTHROPLASTY     disc replacement in neck 1998   COLONOSCOPY     10 year repeat 09/26/2010   ESOPHAGOGASTRODUODENOSCOPY  09/26/2010,01/08/13   LOOP RECORDER INSERTION N/A 05/29/2016   Procedure: Loop Recorder Insertion;  Surgeon: Doylene Canning  Ladona Ridgel, MD;  Location: [facility];  Service: Cardiovascular;  Laterality: N/A;   OTHER SURGICAL HISTORY  1983   hysterectomy ovaries intact, prolapsed    OTHER SURGICAL HISTORY  2006   pyloric stricture surgery   TOTAL HIP ARTHROPLASTY Right 06/07/2015   Procedure: TOTAL HIP ARTHROPLASTY ANTERIOR APPROACH;  Surgeon: Gean Birchwood, MD;  Location: [facility] OR;  Service: Orthopedics;  Laterality: Right;    Current Outpatient Medications  Medication Sig Dispense Refill   albuterol (PROVENTIL HFA;VENTOLIN HFA) 108 (90 Base) MCG/ACT inhaler Inhale 1-2 puffs into the lungs every 4 (four) hours as  needed for wheezing or shortness of breath.     calcium-vitamin D (OSCAL WITH D) 500-5 MG-MCG tablet Take 1 tablet by mouth daily.     carvedilol (COREG) 3.125 MG tablet Take 3 tablets (9.375 mg total) by mouth 2 (two) times daily. 60 tablet 1   fluticasone (FLONASE) 50 MCG/ACT nasal spray Place 2 sprays into both nostrils daily as needed for allergies or rhinitis.     loratadine (CLARITIN) 10 MG tablet Take 10 mg by mouth daily as needed for allergies.     Multiple Vitamins-Minerals (CENTRUM SILVER 50+WOMEN PO) Take 1 tablet by mouth daily.     olmesartan (BENICAR) 40 MG tablet Take 1 tablet (40 mg total) by mouth daily. 90 tablet 3   rosuvastatin (CRESTOR) 20 MG tablet Take 1 tablet (20 mg total) by mouth daily. 90 tablet 3   warfarin (COUMADIN) 5 MG tablet Take 1 tablet (5 mg total) by mouth as directed. (Patient taking differently: Take 5 mg by mouth daily.) 90 tablet 3   No current facility-administered medications for this visit.    Allergies:   Hydrochlorothiazide, Penicillins, Adhesive [tape], Atorvastatin calcium, Codeine, Fentanyl, Sulfa antibiotics, Trimethoprim, and Vicodin [hydrocodone-acetaminophen]   Social History:  The patient  reports that she has never smoked. She has never used smokeless tobacco. She reports that she does not drink alcohol and does not use drugs.   Family History:  The patient's family history includes Cirrhosis in her father; Depression in her sister and sister; Hypertension in her father, mother, and sister; OCD in her sister.  ROS:  Please see the history of present illness.    All other systems are reviewed and otherwise negative.   PHYSICAL EXAM:  VS:  There were no vitals taken for this visit. BMI: There is no height or weight on file to calculate BMI. Well nourished, well developed, in no acute distress HEENT: normocephalic, atraumatic Neck: no JVD, carotid bruits or masses Cardiac: RRR; no significant murmurs, no rubs, or gallops Lungs:  CTA  b/l, no wheezing, rhonchi or rales Abd: soft, nontender MS: no deformity or atrophy Ext: no edema Skin: warm and dry, no rash Neuro:  No gross deficits appreciated Psych: euthymic mood, full affect  ILR site well healed, no rythema, skin changes, tethering   EKG:  not done today  Device interrogation done today and reviewed by myself:  Battery is good waves 0.59mV, one symptoms tracing (ST 100-120) Brady detection changed to 40 given her watch observation Suspect these are ectopy  Recent Labs: 03/13/2022: TSH 1.240 09/25/2022: BUN 10; Creatinine, Ser 0.87; Hemoglobin 13.7; Platelets 246; Potassium 3.9; Sodium 134 10/17/2022: ALT 18  10/17/2022: Chol/HDL Ratio 2.3; Cholesterol, Total 163; HDL 71; LDL Chol Calc (NIH) 78; Triglycerides 71   CrCl cannot be calculated (Patient's most recent lab result is older than the maximum 21 days allowed.).   Wt Readings from Last 3  Encounters:  11/30/22 120 lb 3.2 oz (54.5 kg)  11/28/22 120 lb 3.2 oz (54.5 kg)  10/26/22 122 lb 3.2 oz (55.4 kg)     Other studies reviewed: Additional studies/records reviewed today include: summarized above  ASSESSMENT AND PLAN:  syncope ILR  Well healed, no signs of infection No arrhythmias  3. Paroxysmal AFib Zero % burden On warfarin, monitored/managed with our coumadin clinic  4. HTN Hx of syncope Hx of orthostatic lightheadedness Hx of hypertension  urgency events as well Hx of hyponatremia with some meds in the past as well (by the pt;s report) Repeat by myself is 150/86, may need to stay here  5. Secondary hypercoagulable state      Disposition: back with EP again in 38mo, sooner if needed    Current medicines are reviewed at length with the patient today.  The patient did not have any concerns regarding medicines.  Norma Fredrickson, PA-C 12/14/2022 12:01 PM     [facility] [address] [place] [address] [phone] (office)  [phone]  (fax)

## 2022-12-15 ENCOUNTER — Encounter: Payer: Self-pay | Admitting: Physician Assistant

## 2022-12-15 ENCOUNTER — Ambulatory Visit: Payer: Medicare Other | Attending: Physician Assistant | Admitting: Physician Assistant

## 2022-12-15 VITALS — BP 160/90 | HR 80 | Ht 60.0 in | Wt 120.8 lb

## 2022-12-15 DIAGNOSIS — D6869 Other thrombophilia: Secondary | ICD-10-CM

## 2022-12-15 DIAGNOSIS — R55 Syncope and collapse: Secondary | ICD-10-CM

## 2022-12-15 DIAGNOSIS — I480 Paroxysmal atrial fibrillation: Secondary | ICD-10-CM

## 2022-12-15 DIAGNOSIS — I10 Essential (primary) hypertension: Secondary | ICD-10-CM | POA: Diagnosis not present

## 2022-12-15 DIAGNOSIS — Z4509 Encounter for adjustment and management of other cardiac device: Secondary | ICD-10-CM

## 2022-12-15 LAB — CUP PACEART INCLINIC DEVICE CHECK
Date Time Interrogation Session: 20240830142318
Implantable Pulse Generator Implant Date: 20240813

## 2022-12-15 NOTE — Patient Instructions (Signed)
 Medication Instructions:   Your physician recommends that you continue on your current medications as directed. Please refer to the Current Medication list given to you today.  *If you need a refill on your cardiac medications before your next appointment, please call your pharmacy*   Lab Work: NONE ORDERED  TODAY   If you have labs (blood work) drawn today and your tests are completely normal, you will receive your results only by: MyChart Message (if you have MyChart) OR A paper copy in the mail If you have any lab test that is abnormal or we need to change your treatment, we will call you to review the results.   Testing/Procedures: NONE ORDERED  TODAY     Follow-Up: At [facility], you and your health needs are our priority.  As part of our continuing mission to provide you with exceptional heart care, we have created designated Provider Care Teams.  These Care Teams include your primary Cardiologist (physician) and Advanced Practice Providers (APPs -  Physician Assistants and Nurse Practitioners) who all work together to provide you with the care you need, when you need it.  We recommend signing up for the patient portal called "MyChart".  Sign up information is provided on this After Visit Summary.  MyChart is used to connect with patients for Virtual Visits (Telemedicine).  Patients are able to view lab/test results, encounter notes, upcoming appointments, etc.  Non-urgent messages can be sent to your provider as well.   To learn more about what you can do with MyChart, go to [email or web address].    Your next appointment:   6 month(s)  Provider:   You may see Lewayne Bunting, MD or one of the following Advanced Practice Providers on your designated Care Team:   Francis Dowse, [place] Other Instructions

## 2022-12-22 ENCOUNTER — Ambulatory Visit: Payer: Medicare Other | Attending: Family Medicine

## 2022-12-22 DIAGNOSIS — E785 Hyperlipidemia, unspecified: Secondary | ICD-10-CM | POA: Diagnosis not present

## 2022-12-22 LAB — LIPID PANEL
Chol/HDL Ratio: 2.5 ratio (ref 0.0–4.4)
Cholesterol, Total: 194 mg/dL (ref 100–199)
HDL: 79 mg/dL (ref >39)
LDL Chol Calc (NIH): 105 mg/dL — ABNORMAL HIGH (ref 0–99)
Triglycerides: 55 mg/dL (ref 0–149)
VLDL Cholesterol Cal: 10 mg/dL (ref 5–40)

## 2022-12-22 LAB — ALT: ALT: 22 IU/L (ref 0–32)

## 2022-12-27 ENCOUNTER — Ambulatory Visit: Payer: Medicare Other | Attending: Internal Medicine

## 2022-12-27 DIAGNOSIS — Z5181 Encounter for therapeutic drug level monitoring: Secondary | ICD-10-CM | POA: Insufficient documentation

## 2022-12-27 DIAGNOSIS — I4891 Unspecified atrial fibrillation: Secondary | ICD-10-CM | POA: Insufficient documentation

## 2022-12-27 LAB — POCT INR: INR: 1.8 — AB (ref 2.0–3.0)

## 2022-12-27 NOTE — Patient Instructions (Addendum)
Description   Take 1.5 tablets today and then resume taking warfarin 1 tablet daily except 1/2 tablet on Mondays. Stay consistent with greens each week.  Recheck INR in 2 weeks.  Anticoagulation Clinic [phone]

## 2023-01-01 ENCOUNTER — Ambulatory Visit (INDEPENDENT_AMBULATORY_CARE_PROVIDER_SITE_OTHER): Payer: Medicare Other

## 2023-01-01 DIAGNOSIS — I4891 Unspecified atrial fibrillation: Secondary | ICD-10-CM

## 2023-01-01 DIAGNOSIS — R55 Syncope and collapse: Secondary | ICD-10-CM

## 2023-01-02 LAB — CUP PACEART REMOTE DEVICE CHECK
Date Time Interrogation Session: 20240915130914
Implantable Pulse Generator Implant Date: 20240813

## 2023-01-03 DIAGNOSIS — R6882 Decreased libido: Secondary | ICD-10-CM | POA: Diagnosis not present

## 2023-01-03 DIAGNOSIS — N952 Postmenopausal atrophic vaginitis: Secondary | ICD-10-CM | POA: Diagnosis not present

## 2023-01-10 ENCOUNTER — Ambulatory Visit: Payer: Medicare Other | Attending: Cardiovascular Disease

## 2023-01-10 DIAGNOSIS — I4891 Unspecified atrial fibrillation: Secondary | ICD-10-CM | POA: Diagnosis not present

## 2023-01-10 DIAGNOSIS — Z5181 Encounter for therapeutic drug level monitoring: Secondary | ICD-10-CM | POA: Insufficient documentation

## 2023-01-10 LAB — POCT INR: INR: 1.6 — AB (ref 2.0–3.0)

## 2023-01-10 NOTE — Patient Instructions (Addendum)
Description   Take 2 tablets today and then START taking warfarin 1 tablet daily except 1 1/2 tablets on Mondays.  Stay consistent with greens each week and protein drinks (Premier Protein)  Recheck INR in 2 weeks.  Anticoagulation Clinic [phone]

## 2023-01-15 NOTE — Progress Notes (Signed)
Carelink Summary Report / Loop Recorder 

## 2023-01-17 DIAGNOSIS — R1310 Dysphagia, unspecified: Secondary | ICD-10-CM | POA: Diagnosis not present

## 2023-01-17 DIAGNOSIS — J040 Acute laryngitis: Secondary | ICD-10-CM | POA: Diagnosis not present

## 2023-01-17 DIAGNOSIS — R070 Pain in throat: Secondary | ICD-10-CM | POA: Diagnosis not present

## 2023-01-19 ENCOUNTER — Ambulatory Visit: Payer: Medicare Other

## 2023-01-31 DIAGNOSIS — I10 Essential (primary) hypertension: Secondary | ICD-10-CM | POA: Diagnosis not present

## 2023-01-31 DIAGNOSIS — Z23 Encounter for immunization: Secondary | ICD-10-CM | POA: Diagnosis not present

## 2023-01-31 DIAGNOSIS — E538 Deficiency of other specified B group vitamins: Secondary | ICD-10-CM | POA: Diagnosis not present

## 2023-01-31 DIAGNOSIS — F418 Other specified anxiety disorders: Secondary | ICD-10-CM | POA: Diagnosis not present

## 2023-01-31 DIAGNOSIS — G909 Disorder of the autonomic nervous system, unspecified: Secondary | ICD-10-CM | POA: Diagnosis not present

## 2023-01-31 DIAGNOSIS — Z0000 Encounter for general adult medical examination without abnormal findings: Secondary | ICD-10-CM | POA: Diagnosis not present

## 2023-01-31 DIAGNOSIS — E785 Hyperlipidemia, unspecified: Secondary | ICD-10-CM | POA: Diagnosis not present

## 2023-01-31 DIAGNOSIS — M810 Age-related osteoporosis without current pathological fracture: Secondary | ICD-10-CM | POA: Diagnosis not present

## 2023-01-31 DIAGNOSIS — R7309 Other abnormal glucose: Secondary | ICD-10-CM | POA: Diagnosis not present

## 2023-02-04 LAB — CUP PACEART REMOTE DEVICE CHECK
Date Time Interrogation Session: 20241019231021
Implantable Pulse Generator Implant Date: 20240813

## 2023-02-05 ENCOUNTER — Ambulatory Visit: Payer: Medicare Other

## 2023-02-05 DIAGNOSIS — I4891 Unspecified atrial fibrillation: Secondary | ICD-10-CM | POA: Diagnosis not present

## 2023-02-05 DIAGNOSIS — R55 Syncope and collapse: Secondary | ICD-10-CM

## 2023-02-07 ENCOUNTER — Ambulatory Visit: Payer: Medicare Other | Admitting: Cardiology

## 2023-02-08 ENCOUNTER — Ambulatory Visit: Payer: Medicare Other | Attending: Cardiology | Admitting: *Deleted

## 2023-02-08 DIAGNOSIS — I4891 Unspecified atrial fibrillation: Secondary | ICD-10-CM | POA: Insufficient documentation

## 2023-02-08 DIAGNOSIS — I480 Paroxysmal atrial fibrillation: Secondary | ICD-10-CM | POA: Insufficient documentation

## 2023-02-08 DIAGNOSIS — Z5181 Encounter for therapeutic drug level monitoring: Secondary | ICD-10-CM | POA: Diagnosis not present

## 2023-02-08 LAB — POCT INR: INR: 2.7 (ref 2.0–3.0)

## 2023-02-08 NOTE — Patient Instructions (Signed)
Description   Continue taking warfarin 1 tablet daily.   Stay consistent with greens each week and protein drinks (Premier Protein) daily  Recheck INR in 4 weeks.  Anticoagulation Clinic [phone]

## 2023-02-21 NOTE — Progress Notes (Signed)
Carelink Summary Report / Loop Recorder 

## 2023-03-08 ENCOUNTER — Ambulatory Visit: Payer: Medicare Other | Attending: Internal Medicine

## 2023-03-08 DIAGNOSIS — I4891 Unspecified atrial fibrillation: Secondary | ICD-10-CM | POA: Insufficient documentation

## 2023-03-08 DIAGNOSIS — Z5181 Encounter for therapeutic drug level monitoring: Secondary | ICD-10-CM | POA: Diagnosis not present

## 2023-03-08 LAB — POCT INR: INR: 3.8 — AB (ref 2.0–3.0)

## 2023-03-08 NOTE — Patient Instructions (Signed)
Description   HOLD tomorrow's dose and then continue taking warfarin 1 tablet daily.   Stay consistent with greens each week and protein drinks (Premier Protein) every other day.  Recheck INR in 2 weeks.  Anticoagulation Clinic [phone]

## 2023-03-12 ENCOUNTER — Ambulatory Visit (INDEPENDENT_AMBULATORY_CARE_PROVIDER_SITE_OTHER): Payer: Medicare Other

## 2023-03-12 DIAGNOSIS — I4891 Unspecified atrial fibrillation: Secondary | ICD-10-CM | POA: Diagnosis not present

## 2023-03-12 DIAGNOSIS — M810 Age-related osteoporosis without current pathological fracture: Secondary | ICD-10-CM | POA: Diagnosis not present

## 2023-03-12 DIAGNOSIS — R55 Syncope and collapse: Secondary | ICD-10-CM

## 2023-03-12 DIAGNOSIS — E871 Hypo-osmolality and hyponatremia: Secondary | ICD-10-CM | POA: Diagnosis not present

## 2023-03-12 DIAGNOSIS — E538 Deficiency of other specified B group vitamins: Secondary | ICD-10-CM | POA: Diagnosis not present

## 2023-03-12 LAB — CUP PACEART REMOTE DEVICE CHECK
Date Time Interrogation Session: 20241122230854
Implantable Pulse Generator Implant Date: 20240813

## 2023-03-21 ENCOUNTER — Encounter: Payer: Self-pay | Admitting: Cardiovascular Disease

## 2023-03-21 ENCOUNTER — Ambulatory Visit: Payer: Medicare Other | Attending: Cardiology | Admitting: Cardiovascular Disease

## 2023-03-21 ENCOUNTER — Ambulatory Visit: Payer: Medicare Other

## 2023-03-21 VITALS — BP 134/80 | HR 77 | Ht 60.0 in | Wt 122.6 lb

## 2023-03-21 DIAGNOSIS — I4891 Unspecified atrial fibrillation: Secondary | ICD-10-CM

## 2023-03-21 DIAGNOSIS — I10 Essential (primary) hypertension: Secondary | ICD-10-CM

## 2023-03-21 DIAGNOSIS — E785 Hyperlipidemia, unspecified: Secondary | ICD-10-CM

## 2023-03-21 DIAGNOSIS — Z5181 Encounter for therapeutic drug level monitoring: Secondary | ICD-10-CM | POA: Insufficient documentation

## 2023-03-21 DIAGNOSIS — R55 Syncope and collapse: Secondary | ICD-10-CM

## 2023-03-21 DIAGNOSIS — D6869 Other thrombophilia: Secondary | ICD-10-CM | POA: Diagnosis not present

## 2023-03-21 DIAGNOSIS — I480 Paroxysmal atrial fibrillation: Secondary | ICD-10-CM

## 2023-03-21 DIAGNOSIS — I2510 Atherosclerotic heart disease of native coronary artery without angina pectoris: Secondary | ICD-10-CM

## 2023-03-21 LAB — POCT INR: INR: 2.4 (ref 2.0–3.0)

## 2023-03-21 NOTE — Patient Instructions (Addendum)
Description   Continue taking warfarin 1 tablet daily.   Stay consistent with greens each week and protein drinks (Premier Protein) 1-2 per week.  Recheck INR in 3 weeks.  Anticoagulation Clinic [phone]

## 2023-03-21 NOTE — Progress Notes (Signed)
Cardiology Office Note:  .   Date:  03/21/2023  ID:  Danielle Garrett, DOB May 23, 1950, MRN [id number] PCP: Farris Has, MD  [facility] Providers Cardiologist:  Thurmon Fair, MD Electrophysiologist:  Lewayne Bunting, MD    History of Present Illness: .   Danielle Garrett is a 72 y.o. female , a longtime patient of Dr. Verdis Prime is here to establish new cardiology follow-up after his retirement.  However, she is also seeing Dr. Lewayne Bunting for paroxysmal atrial fibrillation and monitoring of her implantable loop recorder.  The patient specifically denies any chest pain at rest or with exertion, dyspnea at rest or with exertion, orthopnea, paroxysmal nocturnal dyspnea,  palpitations, focal neurological deficits, intermittent claudication, lower extremity edema, unexplained weight gain, cough, hemoptysis or wheezing.  She has not had any syncopal events in the last several weeks.  She had a severe syncopal event in January 2023 when she was in [place] and had a ankle fracture.  She did have 1 episode of severe weakness while she was lying in bed very similar to her typical prodrome.  This actually woke her from sleep.  She knew not to get out of bed, but felt that she would pass out if she did.  Her implantable loop recorder simply shows mild sinus tachycardia around the time.  She has had syncopal events since her 65s.  The episodes do not have a clear trigger but all have a very typical prodrome where she feels "weak", "like a noodle".  1 episode happened in the shower and she did fall to the floor and was found by her husband "without a pulse".  Each time she recovers without sequelae.  Years ago she had an implantable loop recorder for 3 years that did not show any arrhythmic events associated with her syncope.  She now has another loop recorder in place that has been used to monitor atrial fibrillation.  She has noticed that her smart watch indicates a increase in heart rate that  precedes a sudden decline before she has syncope, but she has never had severe bradycardia.  She has well-controlled hypertension.  She takes a statin for hyperlipidemia.  She is on warfarin anticoagulation.  Coronary CTA 03/06/2022 showed moderate stenosis in the first diagonal artery of borderline hemodynamic significance (FFR 0.8), no other significant obstructive lesions.  However the calcium score was elevated 207 (78th percentile).  The echocardiogram performed in [place] in January 2023 shows normal left ventricular systolic function normal-sized left atrium, no significant valvular abnormalities.   Studies Reviewed: .         Risk Assessment/Calculations:    CHA2DS2-VASc Score = 3   This indicates a 3.2% annual risk of stroke. The patient's score is based upon: CHF History: 0 HTN History: 1 Diabetes History: 0 Stroke History: 0 Vascular Disease History: 0 Age Score: 1 Gender Score: 1            Physical Exam:   VS:  BP 134/80 (BP Location: Left Arm, Patient Position: Sitting, Cuff Size: Normal)   Pulse 77   Ht 5' (1.524 m)   Wt 122 lb 9.6 oz (55.6 kg)   SpO2 96%   BMI 23.94 kg/m    Wt Readings from Last 3 Encounters:  03/21/23 122 lb 9.6 oz (55.6 kg)  12/15/22 120 lb 12.8 oz (54.8 kg)  11/30/22 120 lb 3.2 oz (54.5 kg)    GEN: Well nourished, well developed in no acute distress NECK: No JVD; No  carotid bruits CARDIAC: RRR, no murmurs, rubs, gallops RESPIRATORY:  Clear to auscultation without rales, wheezing or rhonchi  ABDOMEN: Soft, non-tender, non-distended EXTREMITIES:  No edema; No deformity   ASSESSMENT AND PLAN: .    Vasovagal syncope: She gives a pretty typical description of this.  Discussed ways to avoid it by promptly laying down or crouching into a tight ball if she has prodromal symptoms.  Unfortunately the trigger is unclear so it is difficult to avoid it.  She was told at one point that she was drinking too much water when she had the syncopal  event out of town and fractured her ankle.  I question the validity of that advice.  Retrospectively, I wonder if she was prone to having a syncopal event at that time because she was dehydrated and that this was the cause of her hyponatremia.  She cannot eat a high sodium diet due to her high blood pressure.  Diuretics should be carefully avoided. Parox AFib: Very infrequent and asymptomatic.  On appropriate anticoagulation Warfarin: No serious bleeding problems.  Personally I would recommend switching to a direct oral anticoagulant. Elevated coronary calcium score: Coronary CT angiogram in November 2023 did not show any meaningful obstructive lesions (borderline stenosis of the first diagonal artery) but the coronary calcium score was 207 (78th percentile). HTN: Well-controlled.  Avoid excessive reduction in blood pressure which might promote syncope.  Avoid diuretics. HLP: No clinically significant CAD or PAD, but her coronary calcium score is elevated.  Target LDL  preferably less than 70.  Continue statin.       Dispo: Since she is already following up with Dr. Ladona Ridgel in EP, she would rather not have another cardiologist following her at the same time.  This is reasonable.  Will be available for any assistance that she may require in the future.  Signed, Thurmon Fair, MD

## 2023-03-21 NOTE — Patient Instructions (Signed)
Medication Instructions:  No changes *If you need a refill on your cardiac medications before your next appointment, please call your pharmacy*  Follow-Up: At [facility], you and your health needs are our priority.  As part of our continuing mission to provide you with exceptional heart care, we have created designated Provider Care Teams.  These Care Teams include your primary Cardiologist (physician) and Advanced Practice Providers (APPs -  Physician Assistants and Nurse Practitioners) who all work together to provide you with the care you need, when you need it.  We recommend signing up for the patient portal called "MyChart".  Sign up information is provided on this After Visit Summary.  MyChart is used to connect with patients for Virtual Visits (Telemedicine).  Patients are able to view lab/test results, encounter notes, upcoming appointments, etc.  Non-urgent messages can be sent to your provider as well.   To learn more about what you can do with MyChart, go to [email or web address].    Your next appointment:    Follow up as needed  Provider:   Mihai Croitoru, MD        

## 2023-03-23 ENCOUNTER — Other Ambulatory Visit: Payer: Self-pay | Admitting: Internal Medicine

## 2023-03-28 DIAGNOSIS — M810 Age-related osteoporosis without current pathological fracture: Secondary | ICD-10-CM | POA: Diagnosis not present

## 2023-03-28 DIAGNOSIS — R350 Frequency of micturition: Secondary | ICD-10-CM | POA: Diagnosis not present

## 2023-04-04 DIAGNOSIS — M7061 Trochanteric bursitis, right hip: Secondary | ICD-10-CM | POA: Diagnosis not present

## 2023-04-05 ENCOUNTER — Ambulatory Visit: Payer: Medicare Other

## 2023-04-06 NOTE — Progress Notes (Signed)
Carelink Summary Report / Loop Recorder 

## 2023-04-16 ENCOUNTER — Ambulatory Visit: Payer: Medicare Other

## 2023-04-16 DIAGNOSIS — I4891 Unspecified atrial fibrillation: Secondary | ICD-10-CM | POA: Diagnosis not present

## 2023-04-17 LAB — CUP PACEART REMOTE DEVICE CHECK
Date Time Interrogation Session: 20241229232035
Implantable Pulse Generator Implant Date: 20240813

## 2023-04-23 ENCOUNTER — Ambulatory Visit: Payer: Medicare Other

## 2023-04-24 ENCOUNTER — Ambulatory Visit: Payer: Medicare Other | Attending: Orthopedic Surgery | Admitting: Physical Therapy

## 2023-04-24 ENCOUNTER — Other Ambulatory Visit: Payer: Self-pay

## 2023-04-24 DIAGNOSIS — Z9181 History of falling: Secondary | ICD-10-CM | POA: Insufficient documentation

## 2023-04-24 DIAGNOSIS — R2689 Other abnormalities of gait and mobility: Secondary | ICD-10-CM | POA: Insufficient documentation

## 2023-04-24 DIAGNOSIS — M25551 Pain in right hip: Secondary | ICD-10-CM | POA: Diagnosis present

## 2023-04-24 DIAGNOSIS — M25651 Stiffness of right hip, not elsewhere classified: Secondary | ICD-10-CM | POA: Diagnosis present

## 2023-04-24 DIAGNOSIS — M6281 Muscle weakness (generalized): Secondary | ICD-10-CM | POA: Diagnosis present

## 2023-04-24 NOTE — Therapy (Signed)
 OUTPATIENT PHYSICAL THERAPY LOWER EXTREMITY EVALUATION   Patient Name: Danielle Garrett MRN: [id number] DOB:02/04/1951, 73 y.o., female Today's Date: 04/24/2023  END OF SESSION:  PT End of Session - 04/24/23 1424     Visit Number 1    Number of Visits 16    Date for PT Re-Evaluation 06/19/23    Authorization Type Medicare    PT Start Time 1425    PT Stop Time 1510    PT Time Calculation (min) 45 min    Activity Tolerance Patient tolerated treatment well             Past Medical History:  Diagnosis Date   Allergic rhinitis due to other allergen    Anginal pain (HCC) 2011   hospitalized for CP in the past; due to low potassium   Anxiety    Arthritis    Childhood asthma    Chronic hoarseness    Gastroparesis    GERD (gastroesophageal reflux disease)    History of hiatal hernia    Hx of blood transfusion reaction    in the early 80s   Hypertension    Left bundle branch block     (abnormal heart rhythm)   PONV (postoperative nausea and vomiting)    Pyloric stenosis    Past Surgical History:  Procedure Laterality Date   ABDOMINAL HYSTERECTOMY  1983   CARDIAC CATHETERIZATION  10/2009   Cardiac cath normal coronary arteries   CERVICAL DISC ARTHROPLASTY     disc replacement in neck 1998   COLONOSCOPY     10 year repeat 09/26/2010   ESOPHAGOGASTRODUODENOSCOPY  09/26/2010,01/08/13   LOOP RECORDER INSERTION N/A 05/29/2016   Procedure: Loop Recorder Insertion;  Surgeon: Danelle LELON Birmingham, MD;  Location: [facility];  Service: Cardiovascular;  Laterality: N/A;   OTHER SURGICAL HISTORY  1983   hysterectomy ovaries intact, prolapsed    OTHER SURGICAL HISTORY  2006   pyloric stricture surgery   TOTAL HIP ARTHROPLASTY Right 06/07/2015   Procedure: TOTAL HIP ARTHROPLASTY ANTERIOR APPROACH;  Surgeon: Dempsey Sensor, MD;  Location: [facility];  Service: Orthopedics;  Laterality: Right;   Patient Active Problem List   Diagnosis Date Noted   Hypertensive urgency 09/25/2022    Hypertension 06/15/2022   Encounter for therapeutic drug monitoring 05/08/2022   Autonomic dysfunction 09/28/2021   PAF (paroxysmal atrial fibrillation) (HCC) 05/29/2016   Syncope, cardiogenic 05/24/2016   Primary osteoarthritis of right hip 06/05/2015   Pre-operative clearance 06/02/2015   Chest pain 06/02/2015   LBBB (left bundle branch block) 06/02/2015   History of left hip replacement 06/02/2015   Palpitation 10/07/2014   Constipation 08/23/2013   Nausea 08/23/2013    PCP: Kip Righter, MD  REFERRING PROVIDER: Sensor Dempsey, MD  REFERRING DIAG: Right trochanteric bursitis  THERAPY DIAG:  Pain in right hip  Stiffness of right hip, not elsewhere classified  Muscle weakness (generalized)  Other abnormalities of gait and mobility  Rationale for Evaluation and Treatment: Rehabilitation  ONSET DATE: On/off since hip replacement  SUBJECTIVE:   SUBJECTIVE STATEMENT: Pt states she had a R hip replacement 7 years ago. Did well with it but from time to time she gets a lot of pain. Got so bad a couple of weeks ago that she could hardly walk. Went to ortho and x-ray was good. Felt like it was bursitis. Did injection but didn't help with her pain. Over time in the last 3 weeks it has improved doing heat/ice. Pt states it comes and goes  and has happened before. Several spots that hurt: across R groin, R SIJ, and then side of leg around ITB. Was so bad she couldn't sleep on her right side at all and woke up with a lot of pain. Pt states it hurts to stretch.   PERTINENT HISTORY: R THA, L ankle surgery, syncope  PAIN:  Are you having pain? Yes: NPRS scale: at best 0/10, worst 6 or 7, currently 1 or 2  Pain location: across R groin, R SIJ, and then side of leg around ITB Pain description: sharp pains Aggravating factors: stretching in certain ways, standing still for long periods (>30 min) Relieving factors: heating pad at night and ice/heat throughout the day  PRECAUTIONS:  None  RED FLAGS: None   WEIGHT BEARING RESTRICTIONS: No  FALLS:  Has patient fallen in last 6 months? Yes. 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  DIAGNOSTIC FINDINGS: Per pt report hip x-ray (-)  PATIENT SURVEYS:  FOTO: 49; predicted 31  COGNITION: Overall cognitive status: Within functional limits for tasks assessed     SENSATION: WFL  EDEMA:  None  MUSCLE LENGTH: Hamstrings: Right 80 deg (pulls into groin and aggravation in R SIJ); Left >90 deg Debby test: Right to neutral; Left >10 deg deg  POSTURE: forward head  PALPATION: TTP R proximal glute max, glute med, TFL, mildly in piriformis Most tender on R greater trochanter bursa and R SIJ Hypomobile in femoracetabular joint with hip flexion/extension  LOWER EXTREMITY ROM:  Passive ROM Right eval Left eval  Hip flexion 90 firm end feel >120  Hip extension    Hip abduction    Hip adduction    Hip internal rotation    Hip external rotation    Knee flexion    Knee extension    Ankle dorsiflexion    Ankle plantarflexion    Ankle inversion    Ankle eversion     (Blank rows = not tested)  LOWER EXTREMITY MMT:  MMT Right eval Left eval  Hip flexion 4- (pull in back SIJ) 4+  Hip extension 4 4+  Hip abduction 3 5  Hip adduction    Hip internal rotation 5 5  Hip external rotation 3+ 5  Knee flexion 5 5  Knee extension 3+* 5  Ankle dorsiflexion    Ankle plantarflexion    Ankle inversion    Ankle eversion     (Blank rows = not tested, * = pain)  LOWER EXTREMITY SPECIAL  TESTS:  Hip special tests: Belvie (FABER) test: positive , Trendelenburg test: positive , Thomas test: positive , and Hip scouring test: positive   FUNCTIONAL TESTS:  Did not assess  GAIT: Distance walked: Into clinic Assistive device utilized: None Level of assistance: Complete Independence Comments: Slow, decreased step length bilat  TREATMENT DATE: 04/24/23 See HEP below    PATIENT EDUCATION:  Education details: Exam findings, POC, initial HEP Person educated: Patient Education method: Explanation, Demonstration, and Handouts Education comprehension: verbalized understanding, returned demonstration, and needs further education  HOME EXERCISE PROGRAM: Access Code: [email or web address] URL: [email or web address] Date: 04/24/2023 Prepared by: Cedar Ditullio April Earnie Starring  Exercises - Seated Piriformis Stretch (Mirrored)  - 1 x daily - 7 x weekly - 2 sets - 30 sec hold - Sidelying TFL Stretch (Mirrored)  - 1 x daily - 7 x weekly - 2 sets - 30 sec hold - Hooklying Clamshell with Resistance  - 1 x daily - 7 x weekly - 2 sets - 10 reps - Supine Gluteal Sets  - 1 x daily - 7 x weekly - 2 sets - 10 reps - Small Range Straight Leg Raise  - 1 x daily - 7 x weekly - 2 sets - 10 reps  ASSESSMENT:  CLINICAL IMPRESSION: Patient is a 73 y.o. F who was seen today for physical therapy evaluation and treatment for R hip trochanteric bursitis. Assessment significant for R>L hip weakness with pain most prevalent along R SIJ, R groin/anterior hip joint, and R ITB. Pt found to have multiple trigger points in glute max/med, piriformis and TFL. Pt's R hip/femoroacetabular joint is hypomobile with PROM likely further resulting in multiple muscle imbalances including excess motion/pain in R SIJ limiting home and community tasks/amb. Pt will benefit from PT to improve R hip  stability and strength.   OBJECTIVE IMPAIRMENTS: decreased activity tolerance, decreased balance, decreased endurance, decreased mobility, difficulty walking, decreased ROM, decreased strength, hypomobility, increased fascial restrictions, increased muscle spasms, improper body mechanics, postural dysfunction, and pain.   ACTIVITY LIMITATIONS: standing and locomotion level  PARTICIPATION LIMITATIONS: meal prep, shopping, and community activity  PERSONAL FACTORS: Fitness, Past/current experiences, and Time since onset of injury/illness/exacerbation are also affecting patient's functional outcome.   REHAB POTENTIAL: Good  CLINICAL DECISION MAKING: Evolving/moderate complexity  EVALUATION COMPLEXITY: Moderate   GOALS: Goals reviewed with patient? Yes  SHORT TERM GOALS: Target date: 05/22/2023  Pt will be ind with initial HEP Baseline: Goal status: INITIAL  2.  Pt will demo L = R thomas test Baseline:  Goal status: INITIAL  3.  Pt will report >/=50% improvement in pain Baseline:  Goal status: INITIAL   LONG TERM GOALS: Target date: 06/19/2023   Pt will be ind with management and progression of HEP Baseline:  Goal status: INITIAL  2.  Pt will report >/=75% improvement in pain Baseline:  Goal status: INITIAL  3.  Pt will have improved FOTO to >/=62 Baseline:  Goal status: INITIAL  4.  Pt will demo L = R hamstring length without pain Baseline:  Goal status: INITIAL  5.  Pt will demo at least 4/5 strength in R LE for improved leg stability Baseline:  Goal status: INITIAL   PLAN:  PT FREQUENCY: 2x/week  PT DURATION: 8 weeks  PLANNED INTERVENTIONS: 97164- PT Re-evaluation, 97110-Therapeutic exercises, 97530- Therapeutic activity, 97112- Neuromuscular re-education, 97535- Self Care, 02859- Manual therapy, 97116- Gait training, 97014- Electrical stimulation (unattended), Patient/Family education, Balance training, Taping, Dry Needling, Joint mobilization, Spinal  mobilization, Cryotherapy, and Moist heat  PLAN FOR NEXT SESSION: Assess response to HEP. Continue hip strength stabilization/isometrics. Add core strengthening to stabilize pelvis/SIJ. Stretch or manual for glutes/TFL if pt tolerates.    Melodee Lupe April Ma L Roshanna Cimino, PT 04/24/2023, 4:44 PM

## 2023-04-26 ENCOUNTER — Ambulatory Visit: Payer: Medicare Other | Admitting: Physical Therapy

## 2023-04-26 DIAGNOSIS — R2689 Other abnormalities of gait and mobility: Secondary | ICD-10-CM

## 2023-04-26 DIAGNOSIS — Z9181 History of falling: Secondary | ICD-10-CM | POA: Diagnosis not present

## 2023-04-26 DIAGNOSIS — M6281 Muscle weakness (generalized): Secondary | ICD-10-CM | POA: Diagnosis not present

## 2023-04-26 DIAGNOSIS — M25551 Pain in right hip: Secondary | ICD-10-CM

## 2023-04-26 DIAGNOSIS — M25651 Stiffness of right hip, not elsewhere classified: Secondary | ICD-10-CM

## 2023-04-26 NOTE — Therapy (Signed)
 OUTPATIENT PHYSICAL THERAPY TREATMENT   Patient Name: Danielle Garrett MRN: [id number] DOB:02-22-1951, 73 y.o., female Today's Date: 04/26/2023  END OF SESSION:  PT End of Session - 04/26/23 1604     Visit Number 2    Number of Visits 16    Date for PT Re-Evaluation 06/19/23    Authorization Type Medicare    PT Start Time 1604    PT Stop Time 1645    PT Time Calculation (min) 41 min    Activity Tolerance Patient tolerated treatment well              Past Medical History:  Diagnosis Date   Allergic rhinitis due to other allergen    Anginal pain (HCC) 2011   hospitalized for CP in the past; due to low potassium   Anxiety    Arthritis    Childhood asthma    Chronic hoarseness    Gastroparesis    GERD (gastroesophageal reflux disease)    History of hiatal hernia    Hx of blood transfusion reaction    in the early 80s   Hypertension    Left bundle branch block     (abnormal heart rhythm)   PONV (postoperative nausea and vomiting)    Pyloric stenosis    Past Surgical History:  Procedure Laterality Date   ABDOMINAL HYSTERECTOMY  1983   CARDIAC CATHETERIZATION  10/2009   Cardiac cath normal coronary arteries   CERVICAL DISC ARTHROPLASTY     disc replacement in neck 1998   COLONOSCOPY     10 year repeat 09/26/2010   ESOPHAGOGASTRODUODENOSCOPY  09/26/2010,01/08/13   LOOP RECORDER INSERTION N/A 05/29/2016   Procedure: Loop Recorder Insertion;  Surgeon: Danelle LELON Birmingham, MD;  Location: [facility];  Service: Cardiovascular;  Laterality: N/A;   OTHER SURGICAL HISTORY  1983   hysterectomy ovaries intact, prolapsed    OTHER SURGICAL HISTORY  2006   pyloric stricture surgery   TOTAL HIP ARTHROPLASTY Right 06/07/2015   Procedure: TOTAL HIP ARTHROPLASTY ANTERIOR APPROACH;  Surgeon: Dempsey Sensor, MD;  Location: [facility];  Service: Orthopedics;  Laterality: Right;   Patient Active Problem List   Diagnosis Date Noted   Hypertensive urgency 09/25/2022   Hypertension  06/15/2022   Encounter for therapeutic drug monitoring 05/08/2022   Autonomic dysfunction 09/28/2021   PAF (paroxysmal atrial fibrillation) (HCC) 05/29/2016   Syncope, cardiogenic 05/24/2016   Primary osteoarthritis of right hip 06/05/2015   Pre-operative clearance 06/02/2015   Chest pain 06/02/2015   LBBB (left bundle branch block) 06/02/2015   History of left hip replacement 06/02/2015   Palpitation 10/07/2014   Constipation 08/23/2013   Nausea 08/23/2013    PCP: Kip Righter, MD  REFERRING PROVIDER: Sensor Dempsey, MD  REFERRING DIAG: Right trochanteric bursitis  THERAPY DIAG:  No diagnosis found.  Rationale for Evaluation and Treatment: Rehabilitation  ONSET DATE: On/off since hip replacement  SUBJECTIVE:   SUBJECTIVE STATEMENT: Pt states she was able to do the exercises and they didn't go too bad.    From eval: Pt states she had a R hip replacement 7 years ago. Did well with it but from time to time she gets a lot of pain. Got so bad a couple of weeks ago that she could hardly walk. Went to ortho and x-ray was good. Felt like it was bursitis. Did injection but didn't help with her pain. Over time in the last 3 weeks it has improved doing heat/ice. Pt states it comes and goes and  has happened before. Several spots that hurt: across R groin, R SIJ, and then side of leg around ITB. Was so bad she couldn't sleep on her right side at all and woke up with a lot of pain. Pt states it hurts to stretch.   PERTINENT HISTORY: R THA, L ankle surgery, syncope  PAIN:  Are you having pain? Yes: NPRS scale: at best 0/10, worst 6 or 7, currently 1 or 2  Pain location: across R groin, R SIJ, and then side of leg around ITB Pain description: sharp pains Aggravating factors: stretching in certain ways, standing still for long periods (>30 min) Relieving factors: heating pad at night and ice/heat throughout the day  PRECAUTIONS: None  WEIGHT BEARING RESTRICTIONS: No  FALLS:  Has  patient fallen in last 6 months? Yes. 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  DIAGNOSTIC FINDINGS: Per pt report hip x-ray (-)  PATIENT SURVEYS:  FOTO: 49; predicted 62  MUSCLE LENGTH: Hamstrings: Right 80 deg (pulls into groin and aggravation in R SIJ); Left >90 deg Debby test: Right to neutral; Left >10 deg deg  PALPATION: TTP R proximal glute max, glute med, TFL, mildly in piriformis Most tender on R greater trochanter bursa and R SIJ Hypomobile in femoracetabular joint with hip flexion/extension  LOWER EXTREMITY ROM:  Passive ROM Right eval Left eval  Hip flexion 90 firm end feel >120  Hip extension    Hip abduction    Hip adduction    Hip internal rotation    Hip external rotation    Knee flexion    Knee extension    Ankle dorsiflexion    Ankle plantarflexion    Ankle inversion    Ankle eversion     (Blank rows = not tested)  LOWER EXTREMITY MMT:  MMT Right eval Left eval  Hip flexion 4- (pull in back SIJ) 4+  Hip extension 4 4+  Hip abduction 3 5  Hip adduction    Hip internal rotation 5 5  Hip external rotation 3+ 5  Knee flexion 5 5  Knee extension 3+* 5  Ankle dorsiflexion    Ankle plantarflexion    Ankle inversion    Ankle eversion     (Blank rows = not tested, * = pain)  LOWER EXTREMITY SPECIAL TESTS:  Hip special tests: Belvie (FABER) test: positive , Trendelenburg test: positive , Thomas test: positive , and Hip scouring test: positive                                                                                                          TREATMENT DATE:  04/26/23 Sitting  Ant/post pelvic tilt x10  Ant tilt + marching x10  Piriformis stretch x 30 Manual therapy  Grade II to III lateral hip distraction, LAD, and inferior mobs for hip flexion Supine  PPT + marching x10  Thomas stretch x 30 Prone  Hip ext 2x10 Quadruped  Child's pose x10 Hip extension x10 Standing   PPT + wall squat 2x10  Stair lunge x5 to attempt hip mobilization but pt unable to get a good stretch  04/24/23 See HEP below    PATIENT EDUCATION:  Education details: Exam findings, POC, initial HEP Person educated: Patient Education method: Explanation, Demonstration, and Handouts Education comprehension: verbalized understanding, returned demonstration, and needs further education  HOME EXERCISE PROGRAM: Access Code: [email or web address] URL: [email or web address] Date: 04/24/2023 Prepared by: Anabeth Chilcott April Earnie Starring  Exercises - Seated Piriformis Stretch (Mirrored)  - 1 x daily - 7 x weekly - 2 sets - 30 sec hold - Sidelying TFL Stretch (Mirrored)  - 1 x daily - 7 x weekly - 2 sets - 30 sec hold - Hooklying Clamshell with Resistance  - 1 x daily - 7 x weekly - 2 sets - 10 reps - Supine Gluteal Sets  - 1 x daily - 7 x weekly - 2 sets - 10 reps - Small Range Straight Leg Raise  - 1 x daily - 7 x weekly - 2 sets - 10 reps  ASSESSMENT:  CLINICAL IMPRESSION: Treatment focused on reviewing and modifying HEP for pt. Continued core and hip strengthening as tolerated. Added manual work to stretch femoroacetabular joint hypomobility. Worked on pelvic control to separate SIJ/pelvic movement from hip movement with good pt understanding. Still getting groin pain when trying hip flexion in supine but this will hopefully improve with increased hip mobility and stability.   From eval: Patient is a 73 y.o. F who was seen today for physical therapy evaluation and treatment for R hip trochanteric bursitis. Assessment  significant for R>L hip weakness with pain most prevalent along R SIJ, R groin/anterior hip joint, and R ITB. Pt found to have multiple trigger points in glute max/med, piriformis and TFL. Pt's R hip/femoroacetabular joint is hypomobile with PROM likely further resulting in multiple muscle imbalances including excess motion/pain in R SIJ limiting home and community tasks/amb. Pt will benefit from PT to improve R hip stability and strength.   OBJECTIVE IMPAIRMENTS: decreased activity tolerance, decreased balance, decreased endurance, decreased mobility, difficulty walking, decreased ROM, decreased strength, hypomobility, increased fascial restrictions, increased muscle spasms, improper body mechanics, postural dysfunction, and pain.     GOALS: Goals reviewed with patient? Yes  SHORT TERM GOALS: Target date: 05/22/2023  Pt will be ind with initial HEP Baseline: Goal status: INITIAL  2.  Pt will demo L = R thomas test Baseline:  Goal status: INITIAL  3.  Pt will report >/=50% improvement in pain Baseline:  Goal status: INITIAL   LONG TERM GOALS: Target date: 06/19/2023   Pt will be ind with management and progression of HEP Baseline:  Goal status: INITIAL  2.  Pt will report >/=75% improvement in pain Baseline:  Goal status: INITIAL  3.  Pt will have improved FOTO to >/=62 Baseline:  Goal status: INITIAL  4.  Pt will demo L = R hamstring length without pain Baseline:  Goal status: INITIAL  5.  Pt will demo at least 4/5 strength in R LE for improved leg stability Baseline:  Goal status: INITIAL   PLAN:  PT FREQUENCY: 2x/week  PT DURATION: 8 weeks  PLANNED INTERVENTIONS: 02835- PT  Re-evaluation, 97110-Therapeutic exercises, 97530- Therapeutic activity, [id number]- Neuromuscular re-education, 97535- Self Care, 02859- Manual therapy, [phone]- Gait training, 97014- Electrical stimulation (unattended), Patient/Family education, Balance training, Taping, Dry Needling, Joint  mobilization, Spinal mobilization, Cryotherapy, and Moist heat  PLAN FOR NEXT SESSION: Assess response to HEP. Continue hip mobilization, stabilization/isometrics. Continue core strengthening to stabilize pelvis/SIJ. Stretch or manual for glutes/TFL if pt tolerates.    Cheetara Hoge April Ma L Letetia Romanello, PT 04/26/2023, 4:05 PM

## 2023-05-01 ENCOUNTER — Ambulatory Visit: Payer: Medicare Other | Admitting: Physical Therapy

## 2023-05-01 DIAGNOSIS — M62830 Muscle spasm of back: Secondary | ICD-10-CM | POA: Diagnosis not present

## 2023-05-01 DIAGNOSIS — M9902 Segmental and somatic dysfunction of thoracic region: Secondary | ICD-10-CM | POA: Diagnosis not present

## 2023-05-01 DIAGNOSIS — M9901 Segmental and somatic dysfunction of cervical region: Secondary | ICD-10-CM | POA: Diagnosis not present

## 2023-05-01 DIAGNOSIS — M9903 Segmental and somatic dysfunction of lumbar region: Secondary | ICD-10-CM | POA: Diagnosis not present

## 2023-05-02 ENCOUNTER — Ambulatory Visit: Payer: Medicare Other | Attending: Cardiovascular Disease

## 2023-05-02 DIAGNOSIS — M62830 Muscle spasm of back: Secondary | ICD-10-CM | POA: Diagnosis not present

## 2023-05-02 DIAGNOSIS — Z5181 Encounter for therapeutic drug level monitoring: Secondary | ICD-10-CM | POA: Diagnosis not present

## 2023-05-02 DIAGNOSIS — M9901 Segmental and somatic dysfunction of cervical region: Secondary | ICD-10-CM | POA: Diagnosis not present

## 2023-05-02 DIAGNOSIS — M9902 Segmental and somatic dysfunction of thoracic region: Secondary | ICD-10-CM | POA: Diagnosis not present

## 2023-05-02 DIAGNOSIS — M9903 Segmental and somatic dysfunction of lumbar region: Secondary | ICD-10-CM | POA: Diagnosis not present

## 2023-05-02 DIAGNOSIS — I4891 Unspecified atrial fibrillation: Secondary | ICD-10-CM | POA: Diagnosis not present

## 2023-05-02 LAB — POCT INR: INR: 2.6 (ref 2.0–3.0)

## 2023-05-02 NOTE — Patient Instructions (Signed)
 Description   Continue taking warfarin 1 tablet daily.   Stay consistent with greens each week. Recheck INR in 4 weeks.  Anticoagulation Clinic [phone]

## 2023-05-03 ENCOUNTER — Ambulatory Visit: Payer: Medicare Other | Admitting: Physical Therapy

## 2023-05-03 DIAGNOSIS — M9901 Segmental and somatic dysfunction of cervical region: Secondary | ICD-10-CM | POA: Diagnosis not present

## 2023-05-03 DIAGNOSIS — M9902 Segmental and somatic dysfunction of thoracic region: Secondary | ICD-10-CM | POA: Diagnosis not present

## 2023-05-03 DIAGNOSIS — M62830 Muscle spasm of back: Secondary | ICD-10-CM | POA: Diagnosis not present

## 2023-05-03 DIAGNOSIS — M9903 Segmental and somatic dysfunction of lumbar region: Secondary | ICD-10-CM | POA: Diagnosis not present

## 2023-05-04 ENCOUNTER — Telehealth: Payer: Self-pay

## 2023-05-04 NOTE — Telephone Encounter (Signed)
ILR alert for symptom Heart pounding  Not at all active  Sitting watching TV. Feel heart beating in chest very hard and fast Event occurred 1/16 @ 14:48, EGM with oversensing, underlying tachy ~150 bpm  Event reviewed: symptomatic event correlates with tachycardia of unknown duration.  There is also noted oversensing during event.  Spoke with patient.  States episode lasted for about 30 minutes and no other symptoms since.  Carvedilol 3.25mg  bid. States on the higher doses her heart rates and blood pressure dropped. States she is very sensitive to meds. On average BP runs 140-150's/90's.

## 2023-05-04 NOTE — Telephone Encounter (Signed)
Spoke with Dr. Ladona Ridgel.  Normally he would increase to 6.25mg  bid of her Carvedilol with these events ;however, if patient cannot tolerate we will continue to monitor.   Patient is aware and agrees that she does not want to increase her Carvedilol.  She reassured me that she has not been having any dizzy or near syncopal spells but will press button and notify us if she does.   Will continue to monitor.

## 2023-05-07 DIAGNOSIS — M9902 Segmental and somatic dysfunction of thoracic region: Secondary | ICD-10-CM | POA: Diagnosis not present

## 2023-05-07 DIAGNOSIS — M9903 Segmental and somatic dysfunction of lumbar region: Secondary | ICD-10-CM | POA: Diagnosis not present

## 2023-05-07 DIAGNOSIS — M62830 Muscle spasm of back: Secondary | ICD-10-CM | POA: Diagnosis not present

## 2023-05-07 DIAGNOSIS — M9901 Segmental and somatic dysfunction of cervical region: Secondary | ICD-10-CM | POA: Diagnosis not present

## 2023-05-08 ENCOUNTER — Ambulatory Visit: Payer: Medicare Other | Admitting: Physical Therapy

## 2023-05-09 DIAGNOSIS — M9903 Segmental and somatic dysfunction of lumbar region: Secondary | ICD-10-CM | POA: Diagnosis not present

## 2023-05-09 DIAGNOSIS — M62830 Muscle spasm of back: Secondary | ICD-10-CM | POA: Diagnosis not present

## 2023-05-09 DIAGNOSIS — M9901 Segmental and somatic dysfunction of cervical region: Secondary | ICD-10-CM | POA: Diagnosis not present

## 2023-05-09 DIAGNOSIS — M9902 Segmental and somatic dysfunction of thoracic region: Secondary | ICD-10-CM | POA: Diagnosis not present

## 2023-05-10 ENCOUNTER — Ambulatory Visit: Payer: Medicare Other | Admitting: Physical Therapy

## 2023-05-10 DIAGNOSIS — M62830 Muscle spasm of back: Secondary | ICD-10-CM | POA: Diagnosis not present

## 2023-05-10 DIAGNOSIS — M9901 Segmental and somatic dysfunction of cervical region: Secondary | ICD-10-CM | POA: Diagnosis not present

## 2023-05-10 DIAGNOSIS — M9903 Segmental and somatic dysfunction of lumbar region: Secondary | ICD-10-CM | POA: Diagnosis not present

## 2023-05-10 DIAGNOSIS — M9902 Segmental and somatic dysfunction of thoracic region: Secondary | ICD-10-CM | POA: Diagnosis not present

## 2023-05-15 ENCOUNTER — Ambulatory Visit: Payer: Medicare Other | Admitting: Physical Therapy

## 2023-05-15 DIAGNOSIS — M9903 Segmental and somatic dysfunction of lumbar region: Secondary | ICD-10-CM | POA: Diagnosis not present

## 2023-05-15 DIAGNOSIS — M9902 Segmental and somatic dysfunction of thoracic region: Secondary | ICD-10-CM | POA: Diagnosis not present

## 2023-05-15 DIAGNOSIS — M62830 Muscle spasm of back: Secondary | ICD-10-CM | POA: Diagnosis not present

## 2023-05-15 DIAGNOSIS — M9901 Segmental and somatic dysfunction of cervical region: Secondary | ICD-10-CM | POA: Diagnosis not present

## 2023-05-16 DIAGNOSIS — M62830 Muscle spasm of back: Secondary | ICD-10-CM | POA: Diagnosis not present

## 2023-05-16 DIAGNOSIS — M9902 Segmental and somatic dysfunction of thoracic region: Secondary | ICD-10-CM | POA: Diagnosis not present

## 2023-05-16 DIAGNOSIS — M9901 Segmental and somatic dysfunction of cervical region: Secondary | ICD-10-CM | POA: Diagnosis not present

## 2023-05-16 DIAGNOSIS — M9903 Segmental and somatic dysfunction of lumbar region: Secondary | ICD-10-CM | POA: Diagnosis not present

## 2023-05-17 ENCOUNTER — Ambulatory Visit: Payer: Medicare Other | Admitting: Physical Therapy

## 2023-05-18 DIAGNOSIS — M62830 Muscle spasm of back: Secondary | ICD-10-CM | POA: Diagnosis not present

## 2023-05-18 DIAGNOSIS — M9901 Segmental and somatic dysfunction of cervical region: Secondary | ICD-10-CM | POA: Diagnosis not present

## 2023-05-18 DIAGNOSIS — M9902 Segmental and somatic dysfunction of thoracic region: Secondary | ICD-10-CM | POA: Diagnosis not present

## 2023-05-18 DIAGNOSIS — M9903 Segmental and somatic dysfunction of lumbar region: Secondary | ICD-10-CM | POA: Diagnosis not present

## 2023-05-21 ENCOUNTER — Encounter: Payer: Self-pay | Admitting: Internal Medicine

## 2023-05-21 ENCOUNTER — Ambulatory Visit (INDEPENDENT_AMBULATORY_CARE_PROVIDER_SITE_OTHER): Payer: Medicare Other

## 2023-05-21 DIAGNOSIS — M62830 Muscle spasm of back: Secondary | ICD-10-CM | POA: Diagnosis not present

## 2023-05-21 DIAGNOSIS — R55 Syncope and collapse: Secondary | ICD-10-CM

## 2023-05-21 DIAGNOSIS — M9901 Segmental and somatic dysfunction of cervical region: Secondary | ICD-10-CM | POA: Diagnosis not present

## 2023-05-21 DIAGNOSIS — M9902 Segmental and somatic dysfunction of thoracic region: Secondary | ICD-10-CM | POA: Diagnosis not present

## 2023-05-21 DIAGNOSIS — I4891 Unspecified atrial fibrillation: Secondary | ICD-10-CM

## 2023-05-21 DIAGNOSIS — M9903 Segmental and somatic dysfunction of lumbar region: Secondary | ICD-10-CM | POA: Diagnosis not present

## 2023-05-21 LAB — CUP PACEART REMOTE DEVICE CHECK
Date Time Interrogation Session: 20250202232208
Implantable Pulse Generator Implant Date: 20240813

## 2023-05-22 DIAGNOSIS — M62830 Muscle spasm of back: Secondary | ICD-10-CM | POA: Diagnosis not present

## 2023-05-22 DIAGNOSIS — M9903 Segmental and somatic dysfunction of lumbar region: Secondary | ICD-10-CM | POA: Diagnosis not present

## 2023-05-22 DIAGNOSIS — M9902 Segmental and somatic dysfunction of thoracic region: Secondary | ICD-10-CM | POA: Diagnosis not present

## 2023-05-22 DIAGNOSIS — M9901 Segmental and somatic dysfunction of cervical region: Secondary | ICD-10-CM | POA: Diagnosis not present

## 2023-05-23 DIAGNOSIS — M62830 Muscle spasm of back: Secondary | ICD-10-CM | POA: Diagnosis not present

## 2023-05-23 DIAGNOSIS — M9902 Segmental and somatic dysfunction of thoracic region: Secondary | ICD-10-CM | POA: Diagnosis not present

## 2023-05-23 DIAGNOSIS — M9903 Segmental and somatic dysfunction of lumbar region: Secondary | ICD-10-CM | POA: Diagnosis not present

## 2023-05-23 DIAGNOSIS — M9901 Segmental and somatic dysfunction of cervical region: Secondary | ICD-10-CM | POA: Diagnosis not present

## 2023-05-28 DIAGNOSIS — M9903 Segmental and somatic dysfunction of lumbar region: Secondary | ICD-10-CM | POA: Diagnosis not present

## 2023-05-28 DIAGNOSIS — M62830 Muscle spasm of back: Secondary | ICD-10-CM | POA: Diagnosis not present

## 2023-05-28 DIAGNOSIS — M9901 Segmental and somatic dysfunction of cervical region: Secondary | ICD-10-CM | POA: Diagnosis not present

## 2023-05-28 DIAGNOSIS — M9902 Segmental and somatic dysfunction of thoracic region: Secondary | ICD-10-CM | POA: Diagnosis not present

## 2023-05-30 ENCOUNTER — Ambulatory Visit: Payer: Medicare Other | Attending: Cardiovascular Disease | Admitting: *Deleted

## 2023-05-30 DIAGNOSIS — I4891 Unspecified atrial fibrillation: Secondary | ICD-10-CM | POA: Diagnosis not present

## 2023-05-30 DIAGNOSIS — M9902 Segmental and somatic dysfunction of thoracic region: Secondary | ICD-10-CM | POA: Diagnosis not present

## 2023-05-30 DIAGNOSIS — Z5181 Encounter for therapeutic drug level monitoring: Secondary | ICD-10-CM | POA: Diagnosis not present

## 2023-05-30 DIAGNOSIS — I480 Paroxysmal atrial fibrillation: Secondary | ICD-10-CM | POA: Insufficient documentation

## 2023-05-30 DIAGNOSIS — R7309 Other abnormal glucose: Secondary | ICD-10-CM | POA: Diagnosis not present

## 2023-05-30 DIAGNOSIS — I10 Essential (primary) hypertension: Secondary | ICD-10-CM | POA: Diagnosis not present

## 2023-05-30 DIAGNOSIS — G909 Disorder of the autonomic nervous system, unspecified: Secondary | ICD-10-CM | POA: Diagnosis not present

## 2023-05-30 DIAGNOSIS — M9903 Segmental and somatic dysfunction of lumbar region: Secondary | ICD-10-CM | POA: Diagnosis not present

## 2023-05-30 DIAGNOSIS — M62830 Muscle spasm of back: Secondary | ICD-10-CM | POA: Diagnosis not present

## 2023-05-30 DIAGNOSIS — M9901 Segmental and somatic dysfunction of cervical region: Secondary | ICD-10-CM | POA: Diagnosis not present

## 2023-05-30 DIAGNOSIS — R8290 Unspecified abnormal findings in urine: Secondary | ICD-10-CM | POA: Diagnosis not present

## 2023-05-30 DIAGNOSIS — E785 Hyperlipidemia, unspecified: Secondary | ICD-10-CM | POA: Diagnosis not present

## 2023-05-30 LAB — POCT INR: INR: 2 (ref 2.0–3.0)

## 2023-05-30 NOTE — Patient Instructions (Signed)
Description   Today take 1.5 tablets of warfarin then continue taking warfarin 1 tablet daily.   Stay consistent with greens each week. Recheck INR in 5 weeks.  Anticoagulation Clinic [phone]

## 2023-06-04 DIAGNOSIS — M62830 Muscle spasm of back: Secondary | ICD-10-CM | POA: Diagnosis not present

## 2023-06-04 DIAGNOSIS — M9902 Segmental and somatic dysfunction of thoracic region: Secondary | ICD-10-CM | POA: Diagnosis not present

## 2023-06-04 DIAGNOSIS — M9901 Segmental and somatic dysfunction of cervical region: Secondary | ICD-10-CM | POA: Diagnosis not present

## 2023-06-04 DIAGNOSIS — M9903 Segmental and somatic dysfunction of lumbar region: Secondary | ICD-10-CM | POA: Diagnosis not present

## 2023-06-12 DIAGNOSIS — M9903 Segmental and somatic dysfunction of lumbar region: Secondary | ICD-10-CM | POA: Diagnosis not present

## 2023-06-12 DIAGNOSIS — M9901 Segmental and somatic dysfunction of cervical region: Secondary | ICD-10-CM | POA: Diagnosis not present

## 2023-06-12 DIAGNOSIS — M62830 Muscle spasm of back: Secondary | ICD-10-CM | POA: Diagnosis not present

## 2023-06-12 DIAGNOSIS — M9902 Segmental and somatic dysfunction of thoracic region: Secondary | ICD-10-CM | POA: Diagnosis not present

## 2023-06-18 ENCOUNTER — Telehealth: Payer: Self-pay

## 2023-06-18 DIAGNOSIS — M9903 Segmental and somatic dysfunction of lumbar region: Secondary | ICD-10-CM | POA: Diagnosis not present

## 2023-06-18 DIAGNOSIS — M9902 Segmental and somatic dysfunction of thoracic region: Secondary | ICD-10-CM | POA: Diagnosis not present

## 2023-06-18 DIAGNOSIS — M9901 Segmental and somatic dysfunction of cervical region: Secondary | ICD-10-CM | POA: Diagnosis not present

## 2023-06-18 DIAGNOSIS — M62830 Muscle spasm of back: Secondary | ICD-10-CM | POA: Diagnosis not present

## 2023-06-18 NOTE — Telephone Encounter (Signed)
 Alert received from [employer] for1 symptom event with intermittent NCT, V-rates ~ 130s with intermittent wider complex R waves and intermittent changes in R wave.  Intermittent dual tach cannot be excluded.    Reports palpitations and shortness of breath after washing her hair. Repots duration was 10 minutes or so. Denies chest pain. Complaint with Coreg 3.125 mg BID. Reports issues with hypotension in past with increasing BB, increased before coreg 6.25 mg and BP dropped. Pt aware to use symptom activator in the future. Routing to Dr. Ladona Ridgel to advise further.

## 2023-06-20 NOTE — Telephone Encounter (Signed)
 Probably atrial fib. Continue current meds including warfarin.

## 2023-06-25 ENCOUNTER — Ambulatory Visit: Payer: Medicare Other

## 2023-06-25 DIAGNOSIS — R55 Syncope and collapse: Secondary | ICD-10-CM | POA: Diagnosis not present

## 2023-06-25 DIAGNOSIS — I480 Paroxysmal atrial fibrillation: Secondary | ICD-10-CM

## 2023-06-25 LAB — CUP PACEART REMOTE DEVICE CHECK
Date Time Interrogation Session: 20250309232148
Implantable Pulse Generator Implant Date: 20240813

## 2023-06-26 ENCOUNTER — Encounter: Payer: Self-pay | Admitting: Internal Medicine

## 2023-06-26 NOTE — Progress Notes (Signed)
 [facility] Summary Report / Loop Recorder

## 2023-07-03 DIAGNOSIS — T63301A Toxic effect of unspecified spider venom, accidental (unintentional), initial encounter: Secondary | ICD-10-CM | POA: Diagnosis not present

## 2023-07-03 DIAGNOSIS — R21 Rash and other nonspecific skin eruption: Secondary | ICD-10-CM | POA: Diagnosis not present

## 2023-07-04 ENCOUNTER — Ambulatory Visit: Payer: Medicare Other | Attending: Internal Medicine

## 2023-07-04 ENCOUNTER — Telehealth: Payer: Self-pay

## 2023-07-04 NOTE — Telephone Encounter (Signed)
 Pt missed coumadin clinic appt. Called, no answer. Left message voicemail.

## 2023-07-16 ENCOUNTER — Ambulatory Visit: Attending: Internal Medicine | Admitting: *Deleted

## 2023-07-16 DIAGNOSIS — M9903 Segmental and somatic dysfunction of lumbar region: Secondary | ICD-10-CM | POA: Diagnosis not present

## 2023-07-16 DIAGNOSIS — I4891 Unspecified atrial fibrillation: Secondary | ICD-10-CM | POA: Diagnosis not present

## 2023-07-16 DIAGNOSIS — Z5181 Encounter for therapeutic drug level monitoring: Secondary | ICD-10-CM | POA: Diagnosis not present

## 2023-07-16 DIAGNOSIS — M9902 Segmental and somatic dysfunction of thoracic region: Secondary | ICD-10-CM | POA: Diagnosis not present

## 2023-07-16 DIAGNOSIS — M62830 Muscle spasm of back: Secondary | ICD-10-CM | POA: Diagnosis not present

## 2023-07-16 DIAGNOSIS — M9901 Segmental and somatic dysfunction of cervical region: Secondary | ICD-10-CM | POA: Diagnosis not present

## 2023-07-16 DIAGNOSIS — I480 Paroxysmal atrial fibrillation: Secondary | ICD-10-CM

## 2023-07-16 LAB — POCT INR: INR: 2.1 (ref 2.0–3.0)

## 2023-07-16 NOTE — Patient Instructions (Signed)
 Description   Continue taking warfarin 1 tablet daily.   Stay consistent with greens each week. Recheck INR in 6 weeks.  Anticoagulation Clinic [phone]

## 2023-07-19 DIAGNOSIS — W57XXXA Bitten or stung by nonvenomous insect and other nonvenomous arthropods, initial encounter: Secondary | ICD-10-CM | POA: Diagnosis not present

## 2023-07-19 DIAGNOSIS — R220 Localized swelling, mass and lump, head: Secondary | ICD-10-CM | POA: Diagnosis not present

## 2023-07-27 NOTE — Progress Notes (Signed)
 [facility] Summary Report / Loop Recorder

## 2023-07-30 ENCOUNTER — Ambulatory Visit (INDEPENDENT_AMBULATORY_CARE_PROVIDER_SITE_OTHER): Payer: Medicare Other

## 2023-07-30 DIAGNOSIS — I4891 Unspecified atrial fibrillation: Secondary | ICD-10-CM | POA: Diagnosis not present

## 2023-07-30 DIAGNOSIS — R55 Syncope and collapse: Secondary | ICD-10-CM

## 2023-07-30 LAB — CUP PACEART REMOTE DEVICE CHECK
Date Time Interrogation Session: 20250413232108
Implantable Pulse Generator Implant Date: 20240813

## 2023-07-31 ENCOUNTER — Encounter: Payer: Self-pay | Admitting: Internal Medicine

## 2023-08-02 ENCOUNTER — Other Ambulatory Visit: Payer: Self-pay | Admitting: Family Medicine

## 2023-08-02 DIAGNOSIS — Z1231 Encounter for screening mammogram for malignant neoplasm of breast: Secondary | ICD-10-CM

## 2023-08-08 DIAGNOSIS — M62830 Muscle spasm of back: Secondary | ICD-10-CM | POA: Diagnosis not present

## 2023-08-08 DIAGNOSIS — M9902 Segmental and somatic dysfunction of thoracic region: Secondary | ICD-10-CM | POA: Diagnosis not present

## 2023-08-08 DIAGNOSIS — M9903 Segmental and somatic dysfunction of lumbar region: Secondary | ICD-10-CM | POA: Diagnosis not present

## 2023-08-08 DIAGNOSIS — M9901 Segmental and somatic dysfunction of cervical region: Secondary | ICD-10-CM | POA: Diagnosis not present

## 2023-08-13 DIAGNOSIS — M62830 Muscle spasm of back: Secondary | ICD-10-CM | POA: Diagnosis not present

## 2023-08-13 DIAGNOSIS — M9901 Segmental and somatic dysfunction of cervical region: Secondary | ICD-10-CM | POA: Diagnosis not present

## 2023-08-13 DIAGNOSIS — M9903 Segmental and somatic dysfunction of lumbar region: Secondary | ICD-10-CM | POA: Diagnosis not present

## 2023-08-13 DIAGNOSIS — M9902 Segmental and somatic dysfunction of thoracic region: Secondary | ICD-10-CM | POA: Diagnosis not present

## 2023-08-15 ENCOUNTER — Ambulatory Visit
Admission: RE | Admit: 2023-08-15 | Discharge: 2023-08-15 | Disposition: A | Discharge status: Home / Self Care | Source: Ambulatory Visit | Attending: Family Medicine | Admitting: Family Medicine

## 2023-08-15 DIAGNOSIS — I480 Paroxysmal atrial fibrillation: Secondary | ICD-10-CM | POA: Diagnosis not present

## 2023-08-15 DIAGNOSIS — Z1231 Encounter for screening mammogram for malignant neoplasm of breast: Secondary | ICD-10-CM | POA: Diagnosis not present

## 2023-08-15 DIAGNOSIS — I4891 Unspecified atrial fibrillation: Secondary | ICD-10-CM | POA: Diagnosis not present

## 2023-08-15 DIAGNOSIS — E785 Hyperlipidemia, unspecified: Secondary | ICD-10-CM | POA: Diagnosis not present

## 2023-08-15 DIAGNOSIS — I10 Essential (primary) hypertension: Secondary | ICD-10-CM | POA: Diagnosis not present

## 2023-08-16 DIAGNOSIS — M9901 Segmental and somatic dysfunction of cervical region: Secondary | ICD-10-CM | POA: Diagnosis not present

## 2023-08-16 DIAGNOSIS — M9902 Segmental and somatic dysfunction of thoracic region: Secondary | ICD-10-CM | POA: Diagnosis not present

## 2023-08-16 DIAGNOSIS — M9903 Segmental and somatic dysfunction of lumbar region: Secondary | ICD-10-CM | POA: Diagnosis not present

## 2023-08-16 DIAGNOSIS — M62830 Muscle spasm of back: Secondary | ICD-10-CM | POA: Diagnosis not present

## 2023-08-20 DIAGNOSIS — I480 Paroxysmal atrial fibrillation: Secondary | ICD-10-CM | POA: Diagnosis not present

## 2023-08-20 DIAGNOSIS — I10 Essential (primary) hypertension: Secondary | ICD-10-CM | POA: Diagnosis not present

## 2023-08-20 DIAGNOSIS — I4891 Unspecified atrial fibrillation: Secondary | ICD-10-CM | POA: Diagnosis not present

## 2023-08-23 DIAGNOSIS — M9901 Segmental and somatic dysfunction of cervical region: Secondary | ICD-10-CM | POA: Diagnosis not present

## 2023-08-23 DIAGNOSIS — M9903 Segmental and somatic dysfunction of lumbar region: Secondary | ICD-10-CM | POA: Diagnosis not present

## 2023-08-23 DIAGNOSIS — M62830 Muscle spasm of back: Secondary | ICD-10-CM | POA: Diagnosis not present

## 2023-08-23 DIAGNOSIS — M9902 Segmental and somatic dysfunction of thoracic region: Secondary | ICD-10-CM | POA: Diagnosis not present

## 2023-08-24 DIAGNOSIS — M9903 Segmental and somatic dysfunction of lumbar region: Secondary | ICD-10-CM | POA: Diagnosis not present

## 2023-08-24 DIAGNOSIS — M9902 Segmental and somatic dysfunction of thoracic region: Secondary | ICD-10-CM | POA: Diagnosis not present

## 2023-08-24 DIAGNOSIS — M62830 Muscle spasm of back: Secondary | ICD-10-CM | POA: Diagnosis not present

## 2023-08-24 DIAGNOSIS — M546 Pain in thoracic spine: Secondary | ICD-10-CM | POA: Diagnosis not present

## 2023-08-24 DIAGNOSIS — M9905 Segmental and somatic dysfunction of pelvic region: Secondary | ICD-10-CM | POA: Diagnosis not present

## 2023-08-29 ENCOUNTER — Encounter

## 2023-09-03 ENCOUNTER — Ambulatory Visit: Payer: Self-pay | Admitting: Internal Medicine

## 2023-09-03 ENCOUNTER — Ambulatory Visit (INDEPENDENT_AMBULATORY_CARE_PROVIDER_SITE_OTHER): Payer: Medicare Other

## 2023-09-03 DIAGNOSIS — I4891 Unspecified atrial fibrillation: Secondary | ICD-10-CM | POA: Diagnosis not present

## 2023-09-03 LAB — CUP PACEART REMOTE DEVICE CHECK
Date Time Interrogation Session: 20250518233123
Implantable Pulse Generator Implant Date: 20240813

## 2023-09-04 ENCOUNTER — Ambulatory Visit: Attending: Internal Medicine | Admitting: *Deleted

## 2023-09-04 DIAGNOSIS — I4891 Unspecified atrial fibrillation: Secondary | ICD-10-CM | POA: Diagnosis not present

## 2023-09-04 DIAGNOSIS — Z5181 Encounter for therapeutic drug level monitoring: Secondary | ICD-10-CM | POA: Diagnosis not present

## 2023-09-04 DIAGNOSIS — I480 Paroxysmal atrial fibrillation: Secondary | ICD-10-CM | POA: Insufficient documentation

## 2023-09-04 LAB — POCT INR: INR: 1.9 — AB (ref 2.0–3.0)

## 2023-09-04 NOTE — Patient Instructions (Signed)
 Description   Today take 1.5 tablets of warfarin then continue taking warfarin 1 tablet daily.   Stay consistent with greens each week. Recheck INR in 5 weeks.  Anticoagulation Clinic [phone]

## 2023-09-05 DIAGNOSIS — M25572 Pain in left ankle and joints of left foot: Secondary | ICD-10-CM | POA: Diagnosis not present

## 2023-09-13 NOTE — Progress Notes (Signed)
 [facility] Summary Report / Loop Recorder

## 2023-09-15 DIAGNOSIS — I480 Paroxysmal atrial fibrillation: Secondary | ICD-10-CM | POA: Diagnosis not present

## 2023-09-15 DIAGNOSIS — I4891 Unspecified atrial fibrillation: Secondary | ICD-10-CM | POA: Diagnosis not present

## 2023-09-15 DIAGNOSIS — E785 Hyperlipidemia, unspecified: Secondary | ICD-10-CM | POA: Diagnosis not present

## 2023-09-15 DIAGNOSIS — I10 Essential (primary) hypertension: Secondary | ICD-10-CM | POA: Diagnosis not present

## 2023-09-18 DIAGNOSIS — I480 Paroxysmal atrial fibrillation: Secondary | ICD-10-CM | POA: Diagnosis not present

## 2023-09-18 DIAGNOSIS — I4891 Unspecified atrial fibrillation: Secondary | ICD-10-CM | POA: Diagnosis not present

## 2023-09-18 DIAGNOSIS — I10 Essential (primary) hypertension: Secondary | ICD-10-CM | POA: Diagnosis not present

## 2023-10-01 DIAGNOSIS — R7303 Prediabetes: Secondary | ICD-10-CM | POA: Diagnosis not present

## 2023-10-01 DIAGNOSIS — E871 Hypo-osmolality and hyponatremia: Secondary | ICD-10-CM | POA: Diagnosis not present

## 2023-10-01 DIAGNOSIS — I480 Paroxysmal atrial fibrillation: Secondary | ICD-10-CM | POA: Diagnosis not present

## 2023-10-01 DIAGNOSIS — N952 Postmenopausal atrophic vaginitis: Secondary | ICD-10-CM | POA: Diagnosis not present

## 2023-10-01 DIAGNOSIS — R269 Unspecified abnormalities of gait and mobility: Secondary | ICD-10-CM | POA: Diagnosis not present

## 2023-10-01 DIAGNOSIS — E538 Deficiency of other specified B group vitamins: Secondary | ICD-10-CM | POA: Diagnosis not present

## 2023-10-01 DIAGNOSIS — M810 Age-related osteoporosis without current pathological fracture: Secondary | ICD-10-CM | POA: Diagnosis not present

## 2023-10-01 DIAGNOSIS — I10 Essential (primary) hypertension: Secondary | ICD-10-CM | POA: Diagnosis not present

## 2023-10-01 DIAGNOSIS — M791 Myalgia, unspecified site: Secondary | ICD-10-CM | POA: Diagnosis not present

## 2023-10-03 DIAGNOSIS — R7309 Other abnormal glucose: Secondary | ICD-10-CM | POA: Diagnosis not present

## 2023-10-03 DIAGNOSIS — G909 Disorder of the autonomic nervous system, unspecified: Secondary | ICD-10-CM | POA: Diagnosis not present

## 2023-10-03 DIAGNOSIS — I10 Essential (primary) hypertension: Secondary | ICD-10-CM | POA: Diagnosis not present

## 2023-10-03 DIAGNOSIS — I480 Paroxysmal atrial fibrillation: Secondary | ICD-10-CM | POA: Diagnosis not present

## 2023-10-03 DIAGNOSIS — E785 Hyperlipidemia, unspecified: Secondary | ICD-10-CM | POA: Diagnosis not present

## 2023-10-03 DIAGNOSIS — E871 Hypo-osmolality and hyponatremia: Secondary | ICD-10-CM | POA: Diagnosis not present

## 2023-10-03 DIAGNOSIS — M549 Dorsalgia, unspecified: Secondary | ICD-10-CM | POA: Diagnosis not present

## 2023-10-04 ENCOUNTER — Ambulatory Visit: Payer: Self-pay | Admitting: Internal Medicine

## 2023-10-04 ENCOUNTER — Ambulatory Visit

## 2023-10-04 DIAGNOSIS — R55 Syncope and collapse: Secondary | ICD-10-CM | POA: Diagnosis not present

## 2023-10-04 LAB — CUP PACEART REMOTE DEVICE CHECK
Date Time Interrogation Session: 20250618232915
Implantable Pulse Generator Implant Date: 20240813

## 2023-10-09 DIAGNOSIS — M5416 Radiculopathy, lumbar region: Secondary | ICD-10-CM | POA: Diagnosis not present

## 2023-10-10 ENCOUNTER — Ambulatory Visit: Attending: Internal Medicine

## 2023-10-10 DIAGNOSIS — I4891 Unspecified atrial fibrillation: Secondary | ICD-10-CM | POA: Insufficient documentation

## 2023-10-10 DIAGNOSIS — Z5181 Encounter for therapeutic drug level monitoring: Secondary | ICD-10-CM | POA: Diagnosis not present

## 2023-10-10 LAB — POCT INR: INR: 1.3 — AB (ref 2.0–3.0)

## 2023-10-10 MED ORDER — WARFARIN SODIUM 5 MG PO TABS: ORAL_TABLET | ORAL | 0 refills | Status: DC

## 2023-10-10 NOTE — Progress Notes (Signed)
Please see anticoagulation encounter.

## 2023-10-10 NOTE — Patient Instructions (Signed)
 Description   Today take 1.5 tablets and 1.5 tablets tomorrow then START taking warfarin 1 tablet daily EXCEPT 2 tablets on Sundays.  Stay consistent with greens each week. Recheck INR in 2 weeks.  Anticoagulation Clinic [phone]

## 2023-10-22 NOTE — Progress Notes (Signed)
 [facility] Summary Report / Loop Recorder

## 2023-10-23 DIAGNOSIS — M5135 Other intervertebral disc degeneration, thoracolumbar region: Secondary | ICD-10-CM | POA: Diagnosis not present

## 2023-10-23 DIAGNOSIS — M47816 Spondylosis without myelopathy or radiculopathy, lumbar region: Secondary | ICD-10-CM | POA: Diagnosis not present

## 2023-10-23 DIAGNOSIS — M5416 Radiculopathy, lumbar region: Secondary | ICD-10-CM | POA: Diagnosis not present

## 2023-10-23 DIAGNOSIS — M51369 Other intervertebral disc degeneration, lumbar region without mention of lumbar back pain or lower extremity pain: Secondary | ICD-10-CM | POA: Diagnosis not present

## 2023-10-23 DIAGNOSIS — M4187 Other forms of scoliosis, lumbosacral region: Secondary | ICD-10-CM | POA: Diagnosis not present

## 2023-10-24 ENCOUNTER — Encounter

## 2023-10-31 ENCOUNTER — Other Ambulatory Visit: Payer: Self-pay | Admitting: Nurse Practitioner

## 2023-11-02 ENCOUNTER — Ambulatory Visit: Attending: Internal Medicine | Admitting: *Deleted

## 2023-11-02 DIAGNOSIS — I480 Paroxysmal atrial fibrillation: Secondary | ICD-10-CM | POA: Insufficient documentation

## 2023-11-02 DIAGNOSIS — I4891 Unspecified atrial fibrillation: Secondary | ICD-10-CM | POA: Insufficient documentation

## 2023-11-02 DIAGNOSIS — E785 Hyperlipidemia, unspecified: Secondary | ICD-10-CM | POA: Diagnosis not present

## 2023-11-02 DIAGNOSIS — R7309 Other abnormal glucose: Secondary | ICD-10-CM | POA: Diagnosis not present

## 2023-11-02 DIAGNOSIS — Z5181 Encounter for therapeutic drug level monitoring: Secondary | ICD-10-CM | POA: Insufficient documentation

## 2023-11-02 DIAGNOSIS — E871 Hypo-osmolality and hyponatremia: Secondary | ICD-10-CM | POA: Diagnosis not present

## 2023-11-02 LAB — POCT INR: INR: 2.4 (ref 2.0–3.0)

## 2023-11-02 NOTE — Patient Instructions (Signed)
 Description   Continue taking warfarin 1 tablet daily EXCEPT 2 tablets on Sundays.  Stay consistent with greens and protein drinks each week.  Recheck INR in 3 weeks.  Anticoagulation Clinic [phone]

## 2023-11-02 NOTE — Progress Notes (Signed)
Please see anticoagulation encounter.

## 2023-11-05 ENCOUNTER — Ambulatory Visit

## 2023-11-05 DIAGNOSIS — R55 Syncope and collapse: Secondary | ICD-10-CM

## 2023-11-06 LAB — CUP PACEART REMOTE DEVICE CHECK
Date Time Interrogation Session: 20250720234024
Implantable Pulse Generator Implant Date: 20240813

## 2023-11-07 ENCOUNTER — Ambulatory Visit: Payer: Self-pay | Admitting: Internal Medicine

## 2023-11-08 DIAGNOSIS — M5416 Radiculopathy, lumbar region: Secondary | ICD-10-CM | POA: Diagnosis not present

## 2023-11-15 DIAGNOSIS — I4891 Unspecified atrial fibrillation: Secondary | ICD-10-CM | POA: Diagnosis not present

## 2023-11-15 DIAGNOSIS — I10 Essential (primary) hypertension: Secondary | ICD-10-CM | POA: Diagnosis not present

## 2023-11-15 DIAGNOSIS — I480 Paroxysmal atrial fibrillation: Secondary | ICD-10-CM | POA: Diagnosis not present

## 2023-11-15 DIAGNOSIS — E785 Hyperlipidemia, unspecified: Secondary | ICD-10-CM | POA: Diagnosis not present

## 2023-11-23 ENCOUNTER — Ambulatory Visit

## 2023-11-26 ENCOUNTER — Ambulatory Visit: Attending: Internal Medicine

## 2023-11-26 DIAGNOSIS — I4891 Unspecified atrial fibrillation: Secondary | ICD-10-CM | POA: Insufficient documentation

## 2023-11-26 DIAGNOSIS — Z5181 Encounter for therapeutic drug level monitoring: Secondary | ICD-10-CM | POA: Insufficient documentation

## 2023-11-26 LAB — POCT INR: INR: 1.2 — AB (ref 2.0–3.0)

## 2023-11-26 NOTE — Patient Instructions (Signed)
 Description   Take 2 tablets today and 1.5 tablets tomorrow. Then resume warfarin 1 tablet daily EXCEPT 2 tablets on Sundays.  Stay consistent with greens and protein drinks each week.  Recheck INR in 1 week.  Anticoagulation Clinic [phone]

## 2023-11-26 NOTE — Progress Notes (Signed)
 INR 1.2; Please see anticoagulation encounter

## 2023-11-28 ENCOUNTER — Other Ambulatory Visit: Payer: Self-pay | Admitting: Nurse Practitioner

## 2023-11-29 ENCOUNTER — Other Ambulatory Visit: Payer: Self-pay | Admitting: Internal Medicine

## 2023-11-29 DIAGNOSIS — I4891 Unspecified atrial fibrillation: Secondary | ICD-10-CM

## 2023-11-29 NOTE — Telephone Encounter (Signed)
 Prescription refill request received for warfarin Lov: Danielle Garrett 12/15/2022 Next INR check: 8/18 Warfarin tablet strength: 5mg    Refill sent.

## 2023-11-29 NOTE — Telephone Encounter (Signed)
 Refill Request.

## 2023-12-03 ENCOUNTER — Telehealth: Payer: Self-pay | Admitting: Internal Medicine

## 2023-12-03 ENCOUNTER — Other Ambulatory Visit: Payer: Self-pay | Admitting: *Deleted

## 2023-12-03 ENCOUNTER — Ambulatory Visit: Attending: Internal Medicine

## 2023-12-03 DIAGNOSIS — I480 Paroxysmal atrial fibrillation: Secondary | ICD-10-CM | POA: Diagnosis not present

## 2023-12-03 DIAGNOSIS — I4891 Unspecified atrial fibrillation: Secondary | ICD-10-CM | POA: Diagnosis not present

## 2023-12-03 DIAGNOSIS — Z5181 Encounter for therapeutic drug level monitoring: Secondary | ICD-10-CM | POA: Insufficient documentation

## 2023-12-03 LAB — POCT INR: INR: 1.4 — AB (ref 2.0–3.0)

## 2023-12-03 NOTE — Progress Notes (Signed)
 INR 1.4. Please see anticoagulation encounter

## 2023-12-03 NOTE — Telephone Encounter (Signed)
 Pt stopped in this morning to get refill on losartan  Scheduled f/u with [facility]

## 2023-12-03 NOTE — Patient Instructions (Signed)
 Increase to 1 tablet daily EXCEPT 2 tablets on Sundays and Fridays.  Stay consistent with greens and protein drinks each week.  Recheck INR in 2 weeks.  Anticoagulation Clinic [phone]

## 2023-12-05 ENCOUNTER — Other Ambulatory Visit: Payer: Self-pay

## 2023-12-05 ENCOUNTER — Ambulatory Visit: Attending: Neurological Surgery

## 2023-12-05 DIAGNOSIS — M6281 Muscle weakness (generalized): Secondary | ICD-10-CM | POA: Insufficient documentation

## 2023-12-05 DIAGNOSIS — R293 Abnormal posture: Secondary | ICD-10-CM | POA: Diagnosis not present

## 2023-12-05 DIAGNOSIS — Z9181 History of falling: Secondary | ICD-10-CM | POA: Diagnosis not present

## 2023-12-05 DIAGNOSIS — M25551 Pain in right hip: Secondary | ICD-10-CM | POA: Diagnosis not present

## 2023-12-05 DIAGNOSIS — M5459 Other low back pain: Secondary | ICD-10-CM | POA: Insufficient documentation

## 2023-12-05 NOTE — Therapy (Unsigned)
 OUTPATIENT PHYSICAL THERAPY THORACOLUMBAR EVALUATION   Patient Name: Danielle Garrett MRN: [id number] DOB:January 18, 1951, 73 y.o., female Today's Date: 12/06/2023  END OF SESSION:  PT End of Session - 12/05/23 1350     Visit Number 1    Number of Visits --    Date for PT Re-Evaluation 02/28/24    Authorization Type Medicare    Progress Note Due on Visit 10    PT Start Time 1345    PT Stop Time 1430    PT Time Calculation (min) 45 min    Activity Tolerance Patient tolerated treatment well    Behavior During Therapy WFL for tasks assessed/performed          Past Medical History:  Diagnosis Date   Allergic rhinitis due to other allergen    Anginal pain (HCC) 2011   hospitalized for CP in the past; due to low potassium   Anxiety    Arthritis    Childhood asthma    Chronic hoarseness    Gastroparesis    GERD (gastroesophageal reflux disease)    History of hiatal hernia    Hx of blood transfusion reaction    in the early 80s   Hypertension    Left bundle branch block     (abnormal heart rhythm)   PONV (postoperative nausea and vomiting)    Pyloric stenosis    Past Surgical History:  Procedure Laterality Date   ABDOMINAL HYSTERECTOMY  1983   CARDIAC CATHETERIZATION  10/2009   Cardiac cath normal coronary arteries   CERVICAL DISC ARTHROPLASTY     disc replacement in neck 1998   COLONOSCOPY     10 year repeat 09/26/2010   ESOPHAGOGASTRODUODENOSCOPY  09/26/2010,01/08/13   LOOP RECORDER INSERTION N/A 05/29/2016   Procedure: Loop Recorder Insertion;  Surgeon: Danelle LELON Birmingham, MD;  Location: [facility];  Service: Cardiovascular;  Laterality: N/A;   OTHER SURGICAL HISTORY  1983   hysterectomy ovaries intact, prolapsed    OTHER SURGICAL HISTORY  2006   pyloric stricture surgery   TOTAL HIP ARTHROPLASTY Right 06/07/2015   Procedure: TOTAL HIP ARTHROPLASTY ANTERIOR APPROACH;  Surgeon: Dempsey Sensor, MD;  Location: [facility];  Service: Orthopedics;  Laterality: Right;   Patient  Active Problem List   Diagnosis Date Noted   Hypertensive urgency 09/25/2022   Hypertension 06/15/2022   Encounter for therapeutic drug monitoring 05/08/2022   Autonomic dysfunction 09/28/2021   PAF (paroxysmal atrial fibrillation) (HCC) 05/29/2016   Syncope, cardiogenic 05/24/2016   Primary osteoarthritis of right hip 06/05/2015   Pre-operative clearance 06/02/2015   Chest pain 06/02/2015   LBBB (left bundle branch block) 06/02/2015   History of left hip replacement 06/02/2015   Palpitation 10/07/2014   Constipation 08/23/2013   Nausea 08/23/2013    PCP: Kip Righter, MD  REFERRING PROVIDER: Joshua Alm Hamilton, MD, neurology clinic  REFERRING DIAG: lumbar spine pain and radiculopathy  Rationale for Evaluation and Treatment: Rehabilitation  THERAPY DIAG:  Other low back pain  Pain in right hip  Muscle weakness (generalized)  History of falling  Abnormal posture  ONSET DATE: chronic , greater than 2 years  SUBJECTIVE:  SUBJECTIVE STATEMENT: Chronic lower back pain and stiffness with episodes of sudden shooting, nerve type pain randomly,B post hips and thighs usually in sitting, not provoked by movement or activity.  Also chronic lower back pain and stiffness.  Has to sit up very slowly in am and shuffles to recliner, then sits on heating pad in recliner for about an hour in order to get moving some Has participated with chiropractor for about 3 months, without relief.  Also has participated with PT , given ex, without relief.  PERTINENT HISTORY:  Extensive history with syncopal episodes , once with resultant L tib fib fx.  H/o R ant THA 2 years ago, still reports residual hip pain  PAIN:  Are you having pain? Yes: NPRS scale: 0 to 9 Pain location: R ant hip, R SI, B post thighs Pain  description: thigh sudden sharp, R ant hip with movement, transitions, lower back constant  Aggravating factors: movement transitions Relieving factors: moist heat   PRECAUTIONS: Other: syncopal episodes  RED FLAGS: None   WEIGHT BEARING RESTRICTIONS: No  FALLS:  Has patient fallen in last 6 months? Yes. Number of falls 1 due to syncope  LIVING ENVIRONMENT: Lives with: lives with their spouse Lives in: House/apartment Stairs: No Has following equipment at home: Single point cane and Environmental consultant - 2 wheeled  OCCUPATION: retired  PLOF: Independent  PATIENT GOALS: get better movement for better quality of life  NEXT MD VISIT: unclear when returns to referring MD  OBJECTIVE:  Note: Objective measures were completed at Evaluation unless otherwise noted.  DIAGNOSTIC FINDINGS:  Degenerative disc disease L4/5 and facet arthropathy L 3-4, L4-5 and vestigal disc L 5  PATIENT SURVEYS:  Modified Oswestry Low Back Pain Disability Questionnaire: 35 / 50 = 70.0 %  COGNITION: Overall cognitive status: Within functional limits for tasks assessed     SENSATION: WFL  MUSCLE LENGTH: Hamstrings: Right wfl deg; Left wfl deg Debby test: Right wfl deg; Left wfl deg  POSTURE: standing , L iliac crest 1/2  to 1 lower than R,mild R pelvic shift ,mid thoracic kyphosis noted  PALPATION: Exquisitely tender R post SI jt line, also R gluteus minimus, medius PA glides lumbar non provoking, patient reports some pain relief with pressure  Poor PA mobility lower thoracic spine noted LUMBAR ROM:   AROM eval  Flexion wfl  Extension 10  Right lateral flexion Nt due to balance  Left lateral flexion nt  Right rotation nt  Left rotation nt   (Blank rows = not tested)  LOWER EXTREMITY ROM:   hip ROM B wnl   LOWER EXTREMITY MMT: noted instability, very poor control and balance with toe walking, 3+/5 strength B plantarflexors Bridging B 1/2 available ROM, 3-/5 B hip extensors R hip flexion p!   , 3-/5  Coordination testing B ankles wnl  LUMBAR SPECIAL TESTS:  Straight leg raise test: Negative and Thomas test: Negative  FUNCTIONAL TESTS:    30 sec sit to stand, 5 reps, labored Gait speed, 3 m in 8.73 sec or 0.34 m/sec   Bed mobility, labored, slow for rolling side to side and for supine to sit and sit to supine GAIT: No device, trendelenberg R, maintains flexed posture through trunk approximately 20 degrees, guarded, slow gait speed, decreased stance time on R, limited R hip extension in terminal stance/pre swing on R.  Noted that pt  inadvertently turned head to L while weight bearing on R and had sharp pain R SI causing her to stop  for a few mins  TREATMENT DATE: 12/05/23: Evaluation, discussion of POC with pt, educated regarding aquatic PT.   Manual pelvic traction in supine with sheet as a trial, while pt marched each leg, no pain with this movement with the traction vs marked pain R ant hip with active supine pelvic traction. Instructed in seated hip abd/ER with green t band , 15 reps, 2 x day and provided with band for home.                                                                                                                                PATIENT EDUCATION:  Education details: POC, goals Person educated: Patient Education method: Explanation, Demonstration, Tactile cues, Verbal cues, and Handouts Education comprehension: verbalized understanding, returned demonstration, verbal cues required, tactile cues required, and needs further education  HOME EXERCISE PROGRAM: TBD  ASSESSMENT:  CLINICAL IMPRESSION: Patient is a 73 y.o. female who was evaluated today by physical therapy due to chronic lower back pain and R hip pain.  Her lower back pain is chronic and she has tried other approaches for her pain including chiropractic and previous PT with therex, without consistent relief.  She does also have h/o R ant THA 2 years ago, reports ongoing pain, dysfunction R  hip since that surgery. She has marked postural changes, difficulty achieving upright posture, also labored, difficult bed mobility and transfers.Her gait speed is very slow , she also demonstrated balance deficits with MMT with toe walking and fear of falling/ instability with standing lumbar ROM assessment.  She demonstrates some weakness B gluteals and R ant hip musculature.  Would recommend physical therapy, initially would recommend manual techniques, possible modalities to reduce her soft tissue discomfort R post hip and SI musculature.  Also recommended aquatic PT to provide some lumbar decompression while strengthening her gluts and core. She may have a true leg length discrepancy due to her previous L tib fib fracture, which may be contributing to her overall postural dysfunction and labored movements therefore a  trial of insert L shoe may be beneficial as well. She is a good candidate for PT to address her deficits and assist her with improving her function.  OBJECTIVE IMPAIRMENTS: decreased activity tolerance, decreased balance, decreased endurance, decreased mobility, difficulty walking, decreased ROM, decreased strength, hypomobility, impaired perceived functional ability, impaired flexibility, postural dysfunction, and pain.   ACTIVITY LIMITATIONS: carrying, lifting, bending, standing, squatting, sleeping, stairs, transfers, and bed mobility  PARTICIPATION LIMITATIONS: school  PERSONAL FACTORS: Age, Behavior pattern, Past/current experiences, Time since onset of injury/illness/exacerbation, and 1-2 comorbidities: h/o syncopal episodes and falls, h/o R THA  are also affecting patient's functional outcome.   REHAB POTENTIAL: Good  CLINICAL DECISION MAKING: Evolving/moderate complexity  EVALUATION COMPLEXITY: Moderate   GOALS: Goals reviewed with patient? Yes  SHORT TERM GOALS: Target date: 2 weeks, 12/20/23  I HEP Baseline: TBD Goal status: INITIAL   LONG TERM GOALS: Target  date: 28  weeks, 06/19/23  Improve gait speed from 0.34 m/sec to 1 m/sec with or without LAD Baseline:  Goal status: INITIAL  2.  30 sec sit to stand 10 reps or greater Baseline: 5 Goal status: INITIAL  3.  Modified oswestry improve from 70% disability to 59% or less Baseline:  Goal status: INITIAL  4.  Improve R hip flexion and ext strength from 3-/5 to 4/5 for less labored bed mobility Baseline:  Goal status: INITIAL  PLAN: PT FREQUENCY: 2x/week  PT DURATION: 12 weeks  PLANNED INTERVENTIONS: 97110-Therapeutic exercises, 97530- Therapeutic activity, 97112- Neuromuscular re-education, 97535- Self Care, 02859- Manual therapy, [phone]- Gait training, [phone]- Aquatic Therapy, [phone]- Ionotophoresis 4mg /ml Dexamethasone , Patient/Family education, Balance training, and Spinal mobilization.  PLAN FOR NEXT SESSION: may try cork insert L shoe to improve pelvic symmetry , soft tissue manual techniques R lumbar and glutes, SI, cardiovascular equipment, isolated strengthening B hips, transition to aquatic PT when scheduling available, would recommend several weeks of aquatic PT in a row   Patric Buckhalter L Devlyn Parish, PT, DPT, OCS 12/06/2023, 1:08 PM

## 2023-12-06 ENCOUNTER — Ambulatory Visit (INDEPENDENT_AMBULATORY_CARE_PROVIDER_SITE_OTHER)

## 2023-12-06 DIAGNOSIS — R55 Syncope and collapse: Secondary | ICD-10-CM

## 2023-12-06 LAB — CUP PACEART REMOTE DEVICE CHECK
Date Time Interrogation Session: 20250820234848
Implantable Pulse Generator Implant Date: 20240813

## 2023-12-06 NOTE — Progress Notes (Signed)
 [facility] Summary Report / Loop Recorder

## 2023-12-07 ENCOUNTER — Encounter: Payer: Self-pay | Admitting: Internal Medicine

## 2023-12-07 NOTE — Telephone Encounter (Signed)
 Called patient. Verified name and DOB. Patient stated having a syncope episode and does not think phone captured episode. Patient is saying they cannot sync loop recorder to new phone. Instructed patient to contact [employer] monitor support for assistance. Provided patient with phone number to contact them. Patient verbalized understanding.  Josie RN

## 2023-12-09 ENCOUNTER — Ambulatory Visit: Payer: Self-pay | Admitting: Internal Medicine

## 2023-12-13 NOTE — Therapy (Signed)
 OUTPATIENT PHYSICAL THERAPY THORACOLUMBAR TREATMENT   Patient Name: Danielle Garrett MRN: [id number] DOB:November 29, 1950, 73 y.o., female Today's Date: 12/14/2023  END OF SESSION:  PT End of Session - 12/14/23 0757     Visit Number 2    Date for PT Re-Evaluation 02/28/24    Authorization Type Medicare    Progress Note Due on Visit 10    PT Start Time 0800    PT Stop Time 0845    PT Time Calculation (min) 45 min    Activity Tolerance Patient tolerated treatment well    Behavior During Therapy [facility] for tasks assessed/performed           Past Medical History:  Diagnosis Date   Allergic rhinitis due to other allergen    Anginal pain (HCC) 2011   hospitalized for CP in the past; due to low potassium   Anxiety    Arthritis    Childhood asthma    Chronic hoarseness    Gastroparesis    GERD (gastroesophageal reflux disease)    History of hiatal hernia    Hx of blood transfusion reaction    in the early 80s   Hypertension    Left bundle branch block     (abnormal heart rhythm)   PONV (postoperative nausea and vomiting)    Pyloric stenosis    Past Surgical History:  Procedure Laterality Date   ABDOMINAL HYSTERECTOMY  1983   CARDIAC CATHETERIZATION  10/2009   Cardiac cath normal coronary arteries   CERVICAL DISC ARTHROPLASTY     disc replacement in neck 1998   COLONOSCOPY     10 year repeat 09/26/2010   ESOPHAGOGASTRODUODENOSCOPY  09/26/2010,01/08/13   LOOP RECORDER INSERTION N/A 05/29/2016   Procedure: Loop Recorder Insertion;  Surgeon: Danelle LELON Birmingham, MD;  Location: [facility];  Service: Cardiovascular;  Laterality: N/A;   OTHER SURGICAL HISTORY  1983   hysterectomy ovaries intact, prolapsed    OTHER SURGICAL HISTORY  2006   pyloric stricture surgery   TOTAL HIP ARTHROPLASTY Right 06/07/2015   Procedure: TOTAL HIP ARTHROPLASTY ANTERIOR APPROACH;  Surgeon: Dempsey Sensor, MD;  Location: [facility];  Service: Orthopedics;  Laterality: Right;   Patient Active Problem List    Diagnosis Date Noted   Hypertensive urgency 09/25/2022   Hypertension 06/15/2022   Encounter for therapeutic drug monitoring 05/08/2022   Autonomic dysfunction 09/28/2021   PAF (paroxysmal atrial fibrillation) (HCC) 05/29/2016   Syncope, cardiogenic 05/24/2016   Primary osteoarthritis of right hip 06/05/2015   Pre-operative clearance 06/02/2015   Chest pain 06/02/2015   LBBB (left bundle branch block) 06/02/2015   History of left hip replacement 06/02/2015   Palpitation 10/07/2014   Constipation 08/23/2013   Nausea 08/23/2013    PCP: Kip Righter, MD  REFERRING PROVIDER: Joshua Alm Hamilton, MD, neurology clinic  REFERRING DIAG: lumbar spine pain and radiculopathy  Rationale for Evaluation and Treatment: Rehabilitation  THERAPY DIAG:  Other low back pain  Pain in right hip  Muscle weakness (generalized)  ONSET DATE: chronic , greater than 2 years  SUBJECTIVE:  SUBJECTIVE STATEMENT: It is stiffness and tenderness I wouldn't say pain. It's not like an ouch ouch pain but I notice it.    PERTINENT HISTORY:  Extensive history with syncopal episodes , once with resultant L tib fib fx.  H/o R ant THA 2 years ago, still reports residual hip pain  PAIN:  Are you having pain? Yes: NPRS scale: 0 to 9 Pain location: R ant hip, R SI, B post thighs Pain description: thigh sudden sharp, R ant hip with movement, transitions, lower back constant  Aggravating factors: movement transitions Relieving factors: moist heat   PRECAUTIONS: Other: syncopal episodes  RED FLAGS: None   WEIGHT BEARING RESTRICTIONS: No  FALLS:  Has patient fallen in last 6 months? Yes. Number of falls 1 due to syncope  LIVING ENVIRONMENT: Lives with: lives with their spouse Lives in: House/apartment Stairs: No Has  following equipment at home: Single point cane and Environmental consultant - 2 wheeled  OCCUPATION: retired  PLOF: Independent  PATIENT GOALS: get better movement for better quality of life  NEXT MD VISIT: unclear when returns to referring MD  OBJECTIVE:  Note: Objective measures were completed at Evaluation unless otherwise noted.  DIAGNOSTIC FINDINGS:  Degenerative disc disease L4/5 and facet arthropathy L 3-4, L4-5 and vestigal disc L 5  PATIENT SURVEYS:  Modified Oswestry Low Back Pain Disability Questionnaire: 35 / 50 = 70.0 %  COGNITION: Overall cognitive status: Within functional limits for tasks assessed     SENSATION: WFL  MUSCLE LENGTH: Hamstrings: Right wfl deg; Left wfl deg Debby test: Right wfl deg; Left wfl deg  POSTURE: standing , L iliac crest 1/2  to 1 lower than R,mild R pelvic shift ,mid thoracic kyphosis noted  PALPATION: Exquisitely tender R post SI jt line, also R gluteus minimus, medius PA glides lumbar non provoking, patient reports some pain relief with pressure  Poor PA mobility lower thoracic spine noted LUMBAR ROM:   AROM eval  Flexion wfl  Extension 10  Right lateral flexion Nt due to balance  Left lateral flexion nt  Right rotation nt  Left rotation nt   (Blank rows = not tested)  LOWER EXTREMITY ROM:   hip ROM B wnl   LOWER EXTREMITY MMT: noted instability, very poor control and balance with toe walking, 3+/5 strength B plantarflexors Bridging B 1/2 available ROM, 3-/5 B hip extensors R hip flexion p!  , 3-/5  Coordination testing B ankles wnl  LUMBAR SPECIAL TESTS:  Straight leg raise test: Negative and Thomas test: Negative  FUNCTIONAL TESTS:    30 sec sit to stand, 5 reps, labored Gait speed, 3 m in 8.73 sec or 0.34 m/sec   Bed mobility, labored, slow for rolling side to side and for supine to sit and sit to supine   GAIT: No device, trendelenberg R, maintains flexed posture through trunk approximately 20 degrees, guarded, slow gait  speed, decreased stance time on R, limited R hip extension in terminal stance/pre swing on R.  Noted that pt  inadvertently turned head to L while weight bearing on R and had sharp pain R SI causing her to stop for a few mins  TREATMENT DATE:  12/13/23 Supine LE stretches- LTR, HS, SKTC, glute Feet on pball rotations, knees to chest Bridges 2x10 SLR x10 STM to R glute-very tender with light touch and jumpy  NuStep L5x48mins  Hip abd green 2x10 Ball squeeze 2x10 HEP  12/05/23: Evaluation, discussion of POC with pt, educated regarding aquatic PT.  Manual pelvic traction in supine with sheet as a trial, while pt marched each leg, no pain with this movement with the traction vs marked pain R ant hip with active supine pelvic traction. Instructed in seated hip abd/ER with green t band , 15 reps, 2 x day and provided with band for home.                                                                                                                                PATIENT EDUCATION:  Education details: POC, goals Person educated: Patient Education method: Explanation, Demonstration, Tactile cues, Verbal cues, and Handouts Education comprehension: verbalized understanding, returned demonstration, verbal cues required, tactile cues required, and needs further education  HOME EXERCISE PROGRAM: Access Code: [email or web address] URL: [email or web address] Date: 12/14/2023 Prepared by: Almetta Fam  Exercises - Seated Piriformis Stretch with Trunk Bend  - 1 x daily - 7 x weekly - 2 reps - 30 hold - Seated Hamstring Stretch  - 1 x daily - 7 x weekly - 2 reps - 30 hold - Supine Lower Trunk Rotation  - 1 x daily - 7 x weekly - 2 sets - 10 reps - Supine Piriformis Stretch with Foot on Ground  - 1 x daily - 7 x weekly - 2 reps - 30 hold - Supine Bridge  - 1 x daily - 7 x weekly - 2 sets - 10 reps  ASSESSMENT:  CLINICAL IMPRESSION: Patient is a 73 y.o. female who was seen today by physical  therapy due to chronic lower back pain and R hip pain. She reports she has the worst times in the morning, her gait and balance are off and she feels very stiff. She has some pain in her groin with stretching. Pt was very tender and jumpy with just light STM to her R glute and piriformis area. Was able to tolerate it a little better after it loosened up some. She demonstrates some weakness B gluteals and R ant hip musculature. She is a good candidate for PT to address her deficits and assist her with improving her function.  OBJECTIVE IMPAIRMENTS: decreased activity tolerance, decreased balance, decreased endurance, decreased mobility, difficulty walking, decreased ROM, decreased strength, hypomobility, impaired perceived functional ability, impaired flexibility, postural dysfunction, and pain.   ACTIVITY LIMITATIONS: carrying, lifting, bending, standing, squatting, sleeping, stairs, transfers, and bed mobility  PARTICIPATION LIMITATIONS: school  PERSONAL FACTORS: Age, Behavior pattern, Past/current experiences, Time since onset of injury/illness/exacerbation, and 1-2 comorbidities: h/o syncopal episodes and falls, h/o R THA  are also affecting patient's functional outcome.   REHAB POTENTIAL: Good  CLINICAL DECISION MAKING: Evolving/moderate complexity  EVALUATION COMPLEXITY: Moderate   GOALS: Goals reviewed with patient? Yes  SHORT TERM GOALS: Target date: 2 weeks, 12/20/23  I HEP Baseline: TBD Goal status: INITIAL   LONG TERM GOALS: Target date: 12 weeks, 06/19/23  Improve gait speed from 0.34 m/sec to 1 m/sec with or without LAD Baseline:  Goal status: INITIAL  2.  30 sec sit to stand 10 reps or greater Baseline: 5 Goal status: INITIAL  3.  Modified oswestry improve from 70% disability to 59% or less Baseline:  Goal status: INITIAL  4.  Improve R hip flexion and ext strength from 3-/5 to 4/5 for less labored bed mobility Baseline:  Goal status: INITIAL  PLAN: PT  FREQUENCY: 2x/week  PT DURATION: 12 weeks  PLANNED INTERVENTIONS: 97110-Therapeutic exercises, 97530- Therapeutic activity, 97112- Neuromuscular re-education, 97535- Self Care, 02859- Manual therapy, [phone]- Gait training, [phone]- Aquatic Therapy, [phone]- Ionotophoresis 4mg /ml Dexamethasone , Patient/Family education, Balance training, and Spinal mobilization.  PLAN FOR NEXT SESSION: may try cork insert L shoe to improve pelvic symmetry , soft tissue manual techniques R lumbar and glutes, SI, cardiovascular equipment, isolated strengthening B hips, add to HEP  Almetta Fam, PT, DPT 12/14/2023, 8:44 AM

## 2023-12-14 ENCOUNTER — Ambulatory Visit

## 2023-12-14 DIAGNOSIS — R293 Abnormal posture: Secondary | ICD-10-CM | POA: Diagnosis not present

## 2023-12-14 DIAGNOSIS — M25551 Pain in right hip: Secondary | ICD-10-CM | POA: Diagnosis not present

## 2023-12-14 DIAGNOSIS — M6281 Muscle weakness (generalized): Secondary | ICD-10-CM

## 2023-12-14 DIAGNOSIS — M5459 Other low back pain: Secondary | ICD-10-CM

## 2023-12-14 DIAGNOSIS — Z9181 History of falling: Secondary | ICD-10-CM | POA: Diagnosis not present

## 2023-12-16 DIAGNOSIS — E785 Hyperlipidemia, unspecified: Secondary | ICD-10-CM | POA: Diagnosis not present

## 2023-12-16 DIAGNOSIS — I10 Essential (primary) hypertension: Secondary | ICD-10-CM | POA: Diagnosis not present

## 2023-12-16 DIAGNOSIS — I480 Paroxysmal atrial fibrillation: Secondary | ICD-10-CM | POA: Diagnosis not present

## 2023-12-16 DIAGNOSIS — I4891 Unspecified atrial fibrillation: Secondary | ICD-10-CM | POA: Diagnosis not present

## 2023-12-19 ENCOUNTER — Telehealth: Payer: Self-pay | Admitting: Internal Medicine

## 2023-12-19 DIAGNOSIS — I10 Essential (primary) hypertension: Secondary | ICD-10-CM

## 2023-12-19 MED ORDER — OLMESARTAN MEDOXOMIL 40 MG PO TABS: 40.0000 mg | ORAL_TABLET | Freq: Every day | ORAL | 0 refills | Status: DC

## 2023-12-19 NOTE — Telephone Encounter (Signed)
*  STAT* If patient is at the pharmacy, call can be transferred to refill team.   1. Which medications need to be refilled? (please list name of each medication and dose if known) olmesartan  (BENICAR ) 40 MG tablet    4. Which pharmacy/location (including street and city if local pharmacy) is medication to be sent to?  [facility] [id number] - [place], [place] - [address]     5. Do they need a 30 day or 90 day supply? 90

## 2023-12-19 NOTE — Telephone Encounter (Signed)
 Pt's medication was sent to pt's pharmacy as requested. Confirmation received.

## 2023-12-20 DIAGNOSIS — M5416 Radiculopathy, lumbar region: Secondary | ICD-10-CM | POA: Diagnosis not present

## 2023-12-20 DIAGNOSIS — M47816 Spondylosis without myelopathy or radiculopathy, lumbar region: Secondary | ICD-10-CM | POA: Diagnosis not present

## 2023-12-20 NOTE — Progress Notes (Unsigned)
   Electrophysiology Office Note:   Date:  12/21/2023  ID:  Danielle Garrett, DOB 1950-11-09, MRN [id number]  Primary Cardiologist: Jerel Balding, MD Electrophysiologist: Danelle Birmingham, MD      History of Present Illness:   Danielle Garrett is a 73 y.o. 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   Since last being seen in our clinic the patient reports doing OK. She did have an episode of near syncope at [employer] last month. She laid down and put her feet up and symptoms resolved. No frank syncope. She has occasional singular palpitations, at times with a brief sensation of SOB. She took herself off of her coreg .  No chest pain or edema.   Review of systems complete and found to be negative unless listed in HPI.   Studies Reviewed:    EKG is ordered today. Personal review as below.  EKG Interpretation Date/Time:  Friday December 21 2023 08:07:21 EDT Ventricular Rate:  72 PR Interval:  126 QRS Duration:  118 QT Interval:  414 QTC Calculation: 453 R Axis:   -61  Text Interpretation: Normal sinus rhythm Left axis deviation Left ventricular hypertrophy with QRS widening and repolarization abnormality ( Cornell product ) Anteroseptal infarct (cited on or before 21-Dec-2023) When compared with ECG of 30-Nov-2022 10:33, QRS axis Shifted left Serial changes of Anteroseptal infarct Present Confirmed by Lesia Sharper [phone]) on 12/21/2023 8:19:36 AM    Arrhythmia/Device History Medtronic - ILR - [facility]-explanted in 2020 Linq II implanted 11/2022   Physical Exam:   VS:  BP 130/84 (BP Location: Right Arm, Patient Position: Sitting, Cuff Size: Normal)   Pulse 72   Ht 5' (1.524 m)   Wt 116 lb (52.6 kg)   SpO2 97%   BMI 22.65 kg/m    Wt Readings from Last 3 Encounters:  12/21/23 116 lb (52.6 kg)  03/21/23 122 lb 9.6 oz (55.6 kg)  12/15/22 120 lb 12.8 oz (54.8 kg)     GEN: No acute distress NECK:  No JVD; No carotid bruits CARDIAC: Regular rate and rhythm, no murmurs, rubs, gallops RESPIRATORY:  Clear to auscultation without rales, wheezing or rhonchi  ABDOMEN: Soft, non-tender, non-distended EXTREMITIES:  No edema; No deformity   ILR Interrogation- reviewed in detail today,  See PACEART report  ASSESSMENT AND PLAN:    Atrial fibrillation s/p Medtronic Loop recorder Normal device function See Pace Art report No changes today Continue coumadin  for CHA2DS2/VASc of at least 4.  H/o Vasovagal syncope No further. Episode in [employer] consistent with same, though no frank syncope.  Loop without abnormality  HTN Stable on current regimen   PVCs Burden has increased for 1 month since pt stopped coreg  Start toprol  12.5 mg at bedtime. Titrate as needed.   Follow up with EP Team in 12 months; Sooner with issues.  Signed, Sharper Prentice Lesia, PA-C

## 2023-12-21 ENCOUNTER — Encounter: Payer: Self-pay | Admitting: Student

## 2023-12-21 ENCOUNTER — Ambulatory Visit (INDEPENDENT_AMBULATORY_CARE_PROVIDER_SITE_OTHER): Admitting: Student

## 2023-12-21 ENCOUNTER — Ambulatory Visit: Attending: Internal Medicine | Admitting: Pharmacist

## 2023-12-21 VITALS — BP 130/84 | HR 72 | Ht 60.0 in | Wt 116.0 lb

## 2023-12-21 DIAGNOSIS — I10 Essential (primary) hypertension: Secondary | ICD-10-CM | POA: Diagnosis not present

## 2023-12-21 DIAGNOSIS — R55 Syncope and collapse: Secondary | ICD-10-CM | POA: Insufficient documentation

## 2023-12-21 DIAGNOSIS — Z5181 Encounter for therapeutic drug level monitoring: Secondary | ICD-10-CM | POA: Diagnosis not present

## 2023-12-21 DIAGNOSIS — I4891 Unspecified atrial fibrillation: Secondary | ICD-10-CM | POA: Diagnosis not present

## 2023-12-21 DIAGNOSIS — I480 Paroxysmal atrial fibrillation: Secondary | ICD-10-CM | POA: Diagnosis not present

## 2023-12-21 LAB — POCT INR: INR: 3.2 — AB (ref 2.0–3.0)

## 2023-12-21 MED ORDER — METOPROLOL SUCCINATE ER 25 MG PO TB24: 12.5000 mg | ORAL_TABLET | Freq: Every day | ORAL | 3 refills | Status: AC

## 2023-12-21 NOTE — Progress Notes (Signed)
 INR 3.2 Please see anticoagulation encounter   Description   Follow new regimen: 10 mg every Friday and 5 mg all other days Vitamin K serving today  Recheck INR in 2 weeks.  Anticoagulation Clinic [phone]

## 2023-12-21 NOTE — Patient Instructions (Signed)
 Medication Instructions:  1.Stop carvedilol  2.Start metoprolol  succinate (Toprol  XL) 1/2 tablet (12.5 mg) daily at bedtime.  *If you need a refill on your cardiac medications before your next appointment, please call your pharmacy*  Lab Work: None ordered If you have labs (blood work) drawn today and your tests are completely normal, you will receive your results only by: MyChart Message (if you have MyChart) OR A paper copy in the mail If you have any lab test that is abnormal or we need to change your treatment, we will call you to review the results.  Follow-Up: At [facility], you and your health needs are our priority.  As part of our continuing mission to provide you with exceptional heart care, our providers are all part of one team.  This team includes your primary Cardiologist (physician) and Advanced Practice Providers or APPs (Physician Assistants and Nurse Practitioners) who all work together to provide you with the care you need, when you need it.  Your next appointment:   1 year(s)  Provider:   You will see one of the following Advanced Practice Providers on your designated Care Team:   Charlies Arthur, PA-C Michael Andy Tillery, PA-C Suzann Riddle, NP Daphne Barrack, NP

## 2023-12-21 NOTE — Patient Instructions (Signed)
 Description   Follow new regimen: 10 mg every Friday and 5 mg all other days Vitamin K serving today  Recheck INR in 2 weeks.  Anticoagulation Clinic [phone]

## 2023-12-24 ENCOUNTER — Encounter: Payer: Self-pay | Admitting: Physical Therapy

## 2023-12-24 ENCOUNTER — Ambulatory Visit: Attending: Neurological Surgery | Admitting: Physical Therapy

## 2023-12-24 DIAGNOSIS — R293 Abnormal posture: Secondary | ICD-10-CM | POA: Diagnosis present

## 2023-12-24 DIAGNOSIS — M5459 Other low back pain: Secondary | ICD-10-CM | POA: Diagnosis not present

## 2023-12-24 DIAGNOSIS — M6281 Muscle weakness (generalized): Secondary | ICD-10-CM | POA: Diagnosis not present

## 2023-12-24 DIAGNOSIS — Z9181 History of falling: Secondary | ICD-10-CM | POA: Insufficient documentation

## 2023-12-24 DIAGNOSIS — M25551 Pain in right hip: Secondary | ICD-10-CM | POA: Diagnosis not present

## 2023-12-24 NOTE — Therapy (Signed)
 OUTPATIENT PHYSICAL THERAPY THORACOLUMBAR TREATMENT   Patient Name: Danielle Garrett MRN: [id number] DOB:09-17-1950, 73 y.o., female Today's Date: 12/24/2023  END OF SESSION:  PT End of Session - 12/24/23 0855     Visit Number 3    Date for PT Re-Evaluation 02/28/24    Authorization Type Medicare    Progress Note Due on Visit 10    PT Start Time 0847    PT Stop Time 0925    PT Time Calculation (min) 38 min    Activity Tolerance Patient tolerated treatment well    Behavior During Therapy [facility] for tasks assessed/performed            Past Medical History:  Diagnosis Date   Allergic rhinitis due to other allergen    Anginal pain (HCC) 2011   hospitalized for CP in the past; due to low potassium   Anxiety    Arthritis    Childhood asthma    Chronic hoarseness    Gastroparesis    GERD (gastroesophageal reflux disease)    History of hiatal hernia    Hx of blood transfusion reaction    in the early 80s   Hypertension    Left bundle branch block     (abnormal heart rhythm)   PONV (postoperative nausea and vomiting)    Pyloric stenosis    Past Surgical History:  Procedure Laterality Date   ABDOMINAL HYSTERECTOMY  1983   CARDIAC CATHETERIZATION  10/2009   Cardiac cath normal coronary arteries   CERVICAL DISC ARTHROPLASTY     disc replacement in neck 1998   COLONOSCOPY     10 year repeat 09/26/2010   ESOPHAGOGASTRODUODENOSCOPY  09/26/2010,01/08/13   LOOP RECORDER INSERTION N/A 05/29/2016   Procedure: Loop Recorder Insertion;  Surgeon: Danelle LELON Birmingham, MD;  Location: [facility] INVASIVE CV LAB;  Service: Cardiovascular;  Laterality: N/A;   OTHER SURGICAL HISTORY  1983   hysterectomy ovaries intact, prolapsed    OTHER SURGICAL HISTORY  2006   pyloric stricture surgery   TOTAL HIP ARTHROPLASTY Right 06/07/2015   Procedure: TOTAL HIP ARTHROPLASTY ANTERIOR APPROACH;  Surgeon: Dempsey Sensor, MD;  Location: [facility] OR;  Service: Orthopedics;  Laterality: Right;   Patient Active Problem List    Diagnosis Date Noted   Hypertensive urgency 09/25/2022   Hypertension 06/15/2022   Encounter for therapeutic drug monitoring 05/08/2022   Autonomic dysfunction 09/28/2021   PAF (paroxysmal atrial fibrillation) (HCC) 05/29/2016   Syncope, cardiogenic 05/24/2016   Primary osteoarthritis of right hip 06/05/2015   Pre-operative clearance 06/02/2015   Chest pain 06/02/2015   LBBB (left bundle branch block) 06/02/2015   History of left hip replacement 06/02/2015   Palpitation 10/07/2014   Constipation 08/23/2013   Nausea 08/23/2013    PCP: Kip Righter, MD  REFERRING PROVIDER: Joshua Alm Hamilton, MD, neurology clinic  REFERRING DIAG: lumbar spine pain and radiculopathy  Rationale for Evaluation and Treatment: Rehabilitation  THERAPY DIAG:  Other low back pain  Pain in right hip  Muscle weakness (generalized)  History of falling  Abnormal posture  ONSET DATE: chronic , greater than 2 years  SUBJECTIVE:  SUBJECTIVE STATEMENT:  I feel typical. Stiff, it affects my balance and gait. I don't have sharp pains, sometimes but not all the time, its more a constant awareness of tightness/stiffness/discomfort of it. I've only had one PT session the first one was a consult.  Will be starting back injections, waiting for clearance from cardiologist   PERTINENT HISTORY:  Extensive history with syncopal episodes , once with resultant L tib fib fx.  H/o R ant THA 2 years ago, still reports residual hip pain  PAIN:  Are you having pain? Yes: NPRS scale: 4/10 Pain location: B low back  Pain description: stiffness Aggravating factors: inactivity  Relieving factors: moist heat   PRECAUTIONS: Other: syncopal episodes  RED FLAGS: None   WEIGHT BEARING RESTRICTIONS: No  FALLS:  Has patient fallen in  last 6 months? Yes. Number of falls 1 due to syncope  LIVING ENVIRONMENT: Lives with: lives with their spouse Lives in: House/apartment Stairs: No Has following equipment at home: Single point cane and Environmental consultant - 2 wheeled  OCCUPATION: retired  PLOF: Independent  PATIENT GOALS: get better movement for better quality of life  NEXT MD VISIT: unclear when returns to referring MD  OBJECTIVE:  Note: Objective measures were completed at Evaluation unless otherwise noted.  DIAGNOSTIC FINDINGS:  Degenerative disc disease L4/5 and facet arthropathy L 3-4, L4-5 and vestigal disc L 5  PATIENT SURVEYS:  Modified Oswestry Low Back Pain Disability Questionnaire: 35 / 50 = 70.0 %  COGNITION: Overall cognitive status: Within functional limits for tasks assessed     SENSATION: WFL  MUSCLE LENGTH: Hamstrings: Right wfl deg; Left wfl deg Debby test: Right wfl deg; Left wfl deg  POSTURE: standing , L iliac crest 1/2  to 1 lower than R,mild R pelvic shift ,mid thoracic kyphosis noted  PALPATION: Exquisitely tender R post SI jt line, also R gluteus minimus, medius PA glides lumbar non provoking, patient reports some pain relief with pressure  Poor PA mobility lower thoracic spine noted LUMBAR ROM:   AROM eval  Flexion wfl  Extension 10  Right lateral flexion Nt due to balance  Left lateral flexion nt  Right rotation nt  Left rotation nt   (Blank rows = not tested)  LOWER EXTREMITY ROM:   hip ROM B wnl   LOWER EXTREMITY MMT: noted instability, very poor control and balance with toe walking, 3+/5 strength B plantarflexors Bridging B 1/2 available ROM, 3-/5 B hip extensors R hip flexion p!  , 3-/5  Coordination testing B ankles wnl  LUMBAR SPECIAL TESTS:  Straight leg raise test: Negative and Thomas test: Negative  FUNCTIONAL TESTS:    30 sec sit to stand, 5 reps, labored; 12/24/23 9 reps labored no UEs  Gait speed, 3 m in 8.73 sec or 0.34 m/sec   Bed mobility, labored, slow  for rolling side to side and for supine to sit and sit to supine   GAIT: No device, trendelenberg R, maintains flexed posture through trunk approximately 20 degrees, guarded, slow gait speed, decreased stance time on R, limited R hip extension in terminal stance/pre swing on R.  Noted that pt  inadvertently turned head to L while weight bearing on R and had sharp pain R SI causing her to stop for a few mins  TREATMENT DATE:   12/24/23  Nustep L4x8 minutes seat 4, all four extremities  30 second STS test, goals  Lumbar rotations stretches 3x10 seconds B (limited due to R groin pain) SKTC 5x5 seconds  B HS stretches- attempted but caused radicular sx in low back  PPT 15x3 seconds  PPT + march x12  Bridge + ABD into green TB x10 Sidelying clams green TB x10 B Hip hikes x12 B Hip hikes + ABD x10 B Forward QL stretch with stool 1x30 seconds     12/13/23 Supine LE stretches- LTR, HS, SKTC, glute Feet on pball rotations, knees to chest Bridges 2x10 SLR x10 STM to R glute-very tender with light touch and jumpy  NuStep L5x63mins  Hip abd green 2x10 Ball squeeze 2x10 HEP  12/05/23: Evaluation, discussion of POC with pt, educated regarding aquatic PT.   Manual pelvic traction in supine with sheet as a trial, while pt marched each leg, no pain with this movement with the traction vs marked pain R ant hip with active supine pelvic traction. Instructed in seated hip abd/ER with green t band , 15 reps, 2 x day and provided with band for home.                                                                                                                                PATIENT EDUCATION:  Education details: POC, goals Person educated: Patient Education method: Explanation, Demonstration, Tactile cues, Verbal cues, and Handouts Education comprehension: verbalized understanding, returned demonstration, verbal cues required, tactile cues required, and needs further education  HOME EXERCISE  PROGRAM: Access Code: [email or web address] URL: [email or web address] Date: 12/14/2023 Prepared by: Almetta Fam  Exercises - Seated Piriformis Stretch with Trunk Bend  - 1 x daily - 7 x weekly - 2 reps - 30 hold - Seated Hamstring Stretch  - 1 x daily - 7 x weekly - 2 reps - 30 hold - Supine Lower Trunk Rotation  - 1 x daily - 7 x weekly - 2 sets - 10 reps - Supine Piriformis Stretch with Foot on Ground  - 1 x daily - 7 x weekly - 2 reps - 30 hold - Supine Bridge  - 1 x daily - 7 x weekly - 2 sets - 10 reps  ASSESSMENT:  CLINICAL IMPRESSION:  Doing OK, still having a lot of stiffness that is always present especially in the mornings and after a period of inactivity. Heat helps a lot. Warmed up on Nustep then continued focus on lumbar mobility and core/hip strength as per POC, with progressions as tolerated. Will continue to challenge her as able.     OBJECTIVE IMPAIRMENTS: decreased activity tolerance, decreased balance, decreased endurance, decreased mobility, difficulty walking, decreased ROM, decreased strength, hypomobility, impaired perceived functional ability, impaired flexibility, postural dysfunction, and pain.   ACTIVITY LIMITATIONS: carrying, lifting, bending, standing, squatting, sleeping, stairs, transfers, and bed mobility  PARTICIPATION LIMITATIONS: school  PERSONAL FACTORS: Age, Behavior pattern, Past/current experiences, Time since onset of injury/illness/exacerbation, and 1-2 comorbidities: h/o syncopal episodes and falls, h/o R THA  are also affecting patient's functional outcome.   REHAB POTENTIAL: Good  CLINICAL DECISION MAKING: Evolving/moderate complexity  EVALUATION COMPLEXITY: Moderate   GOALS: Goals reviewed with patient? Yes  SHORT TERM GOALS: Target date: 2 weeks, 12/20/23  I HEP Baseline: TBD Goal status: ONGOING 12/24/23 assigned last visit    LONG TERM GOALS: Target date: 12 weeks, 02/27/24 CORRECTED DATE 12/24/23  Improve gait speed from 0.34  m/sec to 1 m/sec with or without LAD Baseline:  Goal status: INITIAL  2.  30 sec sit to stand 10 reps or greater Baseline: 5 Goal status: ONGOING 12/24/23  3.  Modified oswestry improve from 70% disability to 59% or less Baseline:  Goal status: INITIAL  4.  Improve R hip flexion and ext strength from 3-/5 to 4/5 for less labored bed mobility Baseline:  Goal status: INITIAL  PLAN: PT FREQUENCY: 2x/week  PT DURATION: 12 weeks  PLANNED INTERVENTIONS: 97110-Therapeutic exercises, 97530- Therapeutic activity, 97112- Neuromuscular re-education, 97535- Self Care, 02859- Manual therapy, [phone]- Gait training, [phone]- Aquatic Therapy, [phone]- Ionotophoresis 4mg /ml Dexamethasone , Patient/Family education, Balance training, and Spinal mobilization.  PLAN FOR NEXT SESSION: may try cork insert L shoe to improve pelvic symmetry , soft tissue manual techniques R lumbar and glutes, SI, cardiovascular equipment, isolated strengthening B hips  Josette Rough, PT, DPT 12/24/23 9:25 AM

## 2023-12-25 ENCOUNTER — Telehealth: Payer: Self-pay

## 2023-12-25 NOTE — Telephone Encounter (Signed)
   Pre-operative Risk Assessment    Patient Name: Danielle Garrett  DOB: 1950/12/03 MRN: [id number]   Date of last office visit: 12/21/23 OZELL PASSEY, PA-C Date of next office visit: NONE   Request for Surgical Clearance    Procedure:  L4-L5 EPIDURAL STEROID INJECTION  Date of Surgery:  Clearance TBD                                Surgeon:  DR DEATRICE MANUS Surgeon's Group or Practice Name:  [facility] Phone number:  [phone] Fax number:  [phone]   ATTN: WYONIA, RN   Type of Clearance Requested:   - Medical  - Pharmacy:  Hold Warfarin (Coumadin ) 5 DAYS PRIOR AND RESUME THE VERY NEXT DAY   Type of Anesthesia:  Not Indicated   Additional requests/questions:    Signed, Lucie DELENA Ku   12/25/2023, 1:13 PM

## 2023-12-26 ENCOUNTER — Ambulatory Visit: Admitting: Physical Therapy

## 2023-12-26 ENCOUNTER — Encounter: Payer: Self-pay | Admitting: Internal Medicine

## 2023-12-26 ENCOUNTER — Encounter: Payer: Self-pay | Admitting: Physical Therapy

## 2023-12-26 DIAGNOSIS — M5459 Other low back pain: Secondary | ICD-10-CM | POA: Diagnosis not present

## 2023-12-26 DIAGNOSIS — Z9181 History of falling: Secondary | ICD-10-CM

## 2023-12-26 DIAGNOSIS — R293 Abnormal posture: Secondary | ICD-10-CM | POA: Diagnosis not present

## 2023-12-26 DIAGNOSIS — M6281 Muscle weakness (generalized): Secondary | ICD-10-CM | POA: Diagnosis not present

## 2023-12-26 DIAGNOSIS — M25551 Pain in right hip: Secondary | ICD-10-CM

## 2023-12-26 NOTE — Therapy (Signed)
 OUTPATIENT PHYSICAL THERAPY THORACOLUMBAR TREATMENT   Patient Name: Mariateresa Batra MRN: [id number] DOB:[birth year], 73 y.o., female Today's Date: 12/26/2023  END OF SESSION:  PT End of Session - 12/26/23 0900     Visit Number 4    Date for PT Re-Evaluation 02/28/24    Authorization Type Medicare    Progress Note Due on Visit 10    PT Start Time 0847    PT Stop Time 0928    PT Time Calculation (min) 41 min    Activity Tolerance Patient tolerated treatment well    Behavior During Therapy [facility] for tasks assessed/performed             Past Medical History:  Diagnosis Date   Allergic rhinitis due to other allergen    Anginal pain (HCC) 2011   hospitalized for CP in the past; due to low potassium   Anxiety    Arthritis    Childhood asthma    Chronic hoarseness    Gastroparesis    GERD (gastroesophageal reflux disease)    History of hiatal hernia    Hx of blood transfusion reaction    in the early 80s   Hypertension    Left bundle branch block     (abnormal heart rhythm)   PONV (postoperative nausea and vomiting)    Pyloric stenosis    Past Surgical History:  Procedure Laterality Date   ABDOMINAL HYSTERECTOMY  1983   CARDIAC CATHETERIZATION  10/2009   Cardiac cath normal coronary arteries   CERVICAL DISC ARTHROPLASTY     disc replacement in neck 1998   COLONOSCOPY     10 year repeat 09/26/2010   ESOPHAGOGASTRODUODENOSCOPY  09/26/2010,01/08/13   LOOP RECORDER INSERTION N/A 05/29/2016   Procedure: Loop Recorder Insertion;  Surgeon: Danelle LELON Birmingham, MD;  Location: [facility] INVASIVE [facility];  Service: Cardiovascular;  Laterality: N/A;   OTHER SURGICAL HISTORY  1983   hysterectomy ovaries intact, prolapsed    OTHER SURGICAL HISTORY  2006   pyloric stricture surgery   TOTAL HIP ARTHROPLASTY Right 06/07/2015   Procedure: TOTAL HIP ARTHROPLASTY ANTERIOR APPROACH;  Surgeon: Dempsey Sensor, MD;  Location: [facility] OR;  Service: Orthopedics;  Laterality: Right;   Patient Active Problem List    Diagnosis Date Noted   Hypertensive urgency 09/25/2022   Hypertension 06/15/2022   Encounter for therapeutic drug monitoring 05/08/2022   Autonomic dysfunction 09/28/2021   PAF (paroxysmal atrial fibrillation) (HCC) 05/29/2016   Syncope, cardiogenic 05/24/2016   Primary osteoarthritis of right hip 06/05/2015   Pre-operative clearance 06/02/2015   Chest pain 06/02/2015   LBBB (left bundle branch block) 06/02/2015   History of left hip replacement 06/02/2015   Palpitation 10/07/2014   Constipation 08/23/2013   Nausea 08/23/2013    PCP: Kip Righter, MD  REFERRING PROVIDER: Joshua Alm Hamilton, MD, neurology clinic  REFERRING DIAG: lumbar spine pain and radiculopathy  Rationale for Evaluation and Treatment: Rehabilitation  THERAPY DIAG:  Other low back pain  Pain in right hip  Muscle weakness (generalized)  History of falling  Abnormal posture  ONSET DATE: chronic , greater than 2 years  SUBJECTIVE:  SUBJECTIVE STATEMENT:  I noticed the afternoon after I saw you that I didn't seem quite as stiff, but it was short lived. Yesterday morning I felt less stiff, but this morning I was back to my normal stiffness this morning. Still having to use a walker sometimes, for example this morning had to use RW this morning until I could get moving and put heat on my back. Balance was very bad this morning.   PERTINENT HISTORY:  Extensive history with syncopal episodes , once with resultant L tib fib fx.  H/o R ant THA 2 years ago, still reports residual hip pain  PAIN:  Are you having pain? Yes: NPRS scale: 4-5/10 Pain location: B low back  Pain description: stiffness Aggravating factors: inactivity  Relieving factors: moist heat   PRECAUTIONS: Other: syncopal episodes  RED  FLAGS: None   WEIGHT BEARING RESTRICTIONS: No  FALLS:  Has patient fallen in last 6 months? Yes. Number of falls 1 due to syncope  LIVING ENVIRONMENT: Lives with: lives with their spouse Lives in: House/apartment Stairs: No Has following equipment at home: Single point cane and Environmental consultant - 2 wheeled  OCCUPATION: retired  PLOF: Independent  PATIENT GOALS: get better movement for better quality of life  NEXT MD VISIT: unclear when returns to referring MD  OBJECTIVE:  Note: Objective measures were completed at Evaluation unless otherwise noted.  DIAGNOSTIC FINDINGS:  Degenerative disc disease L4/5 and facet arthropathy L 3-4, L4-5 and vestigal disc L 5  PATIENT SURVEYS:  Modified Oswestry Low Back Pain Disability Questionnaire: 35 / 50 = 70.0 %  COGNITION: Overall cognitive status: Within functional limits for tasks assessed     SENSATION: WFL  MUSCLE LENGTH: Hamstrings: Right wfl deg; Left wfl deg Debby test: Right wfl deg; Left wfl deg  POSTURE: standing , L iliac crest 1/2  to 1 lower than R,mild R pelvic shift ,mid thoracic kyphosis noted  PALPATION: Exquisitely tender R post SI jt line, also R gluteus minimus, medius PA glides lumbar non provoking, patient reports some pain relief with pressure  Poor PA mobility lower thoracic spine noted LUMBAR ROM:   AROM eval  Flexion wfl  Extension 10  Right lateral flexion Nt due to balance  Left lateral flexion nt  Right rotation nt  Left rotation nt   (Blank rows = not tested)  LOWER EXTREMITY ROM:   hip ROM B wnl   LOWER EXTREMITY MMT: noted instability, very poor control and balance with toe walking, 3+/5 strength B plantarflexors Bridging B 1/2 available ROM, 3-/5 B hip extensors R hip flexion p!  , 3-/5  Coordination testing B ankles wnl  LUMBAR SPECIAL TESTS:  Straight leg raise test: Negative and Thomas test: Negative  FUNCTIONAL TESTS:    30 sec sit to stand, 5 reps, labored; 12/24/23 9 reps labored  no UEs  Gait speed, 3 m in 8.73 sec or 0.34 m/sec   Bed mobility, labored, slow for rolling side to side and for supine to sit and sit to supine   GAIT: No device, trendelenberg R, maintains flexed posture through trunk approximately 20 degrees, guarded, slow gait speed, decreased stance time on R, limited R hip extension in terminal stance/pre swing on R.  Noted that pt  inadvertently turned head to L while weight bearing on R and had sharp pain R SI causing her to stop for a few mins  TREATMENT DATE:    12/26/23  Scifit bike L4x8 minutes (Nustep not available) for w/u  QL stretch forward with stool 3x30 seconds QL stretch forward  with stool + R/L bias 1x30 seconds B  Frog stretch supine 3x30 seconds B DKTC 5x10 seconds with towel behind knees Figure 4 stretch 2x30 seconds B  Wide tandem solid surface 3x30 seconds B min guard  Narrow BOS 4x30 seconds one time MinA due to posterior LOB  Side steps at TM rail min guard x4 laps down and back min guard    12/24/23  Nustep L4x8 minutes seat 4, all four extremities  30 second STS test, goals  Lumbar rotations stretches 3x10 seconds B (limited due to R groin pain) SKTC 5x5 seconds B HS stretches- attempted but caused radicular sx in low back  PPT 15x3 seconds  PPT + march x12  Bridge + ABD into green TB x10 Sidelying clams green TB x10 B Hip hikes x12 B Hip hikes + ABD x10 B Forward QL stretch with stool 1x30 seconds                                                                                                                            PATIENT EDUCATION:  Education details: POC, goals Person educated: Patient Education method: Explanation, Demonstration, Tactile cues, Verbal cues, and Handouts Education comprehension: verbalized understanding, returned demonstration, verbal cues required, tactile cues required, and needs further education  HOME EXERCISE PROGRAM:  Access Code: [email or web address] URL:  [email or web address] Date: 12/26/2023 Prepared by: Josette Rough  Exercises - Seated Piriformis Stretch with Trunk Bend  - 2 x daily - 7 x weekly - 2 reps - 30 hold - Seated Hamstring Stretch  - 2 x daily - 7 x weekly - 2 reps - 30 hold - Supine Lower Trunk Rotation  - 2 x daily - 7 x weekly - 1 sets - 10 reps - Supine Piriformis Stretch with Foot on Ground  - 2 x daily - 7 x weekly - 2 reps - 30 hold - Supine Bridge  - 1 x daily - 7 x weekly - 2 sets - 10 reps - Supine Double Knee to Chest Modified  - 2 x daily - 7 x weekly - 1 sets - 5 reps - 10 seconds  hold - Wide Tandem Stance with Eyes Open  - 1 x daily - 7 x weekly - 1-2 sets - 6 reps - Standing Narrow Base of Support  - 1 x daily - 7 x weekly - 1-2 sets - 5 reps   ASSESSMENT:  CLINICAL IMPRESSION:  Arrives today doing OK, sounds like we had some success with carryover in lumbar/hip flexibility from last visit but it was a bit short lived. She has been trying to stay as active as she can, recently checked out the [facility]. Continued working on lumbar/hip mobility but otherwise introduced some balance work today with HEP additions as appropriate. She starts water PT Monday, anticipate this will be very beneficial.  More unsteady today but she politely declined assistance out to her  car.    OBJECTIVE IMPAIRMENTS: decreased activity tolerance, decreased balance, decreased endurance, decreased mobility, difficulty walking, decreased ROM, decreased strength, hypomobility, impaired perceived functional ability, impaired flexibility, postural dysfunction, and pain.   ACTIVITY LIMITATIONS: carrying, lifting, bending, standing, squatting, sleeping, stairs, transfers, and bed mobility  PARTICIPATION LIMITATIONS: school  PERSONAL FACTORS: Age, Behavior pattern, Past/current experiences, Time since onset of injury/illness/exacerbation, and 1-2 comorbidities: h/o syncopal episodes and falls, h/o R THA  are also affecting patient's  functional outcome.   REHAB POTENTIAL: Good  CLINICAL DECISION MAKING: Evolving/moderate complexity  EVALUATION COMPLEXITY: Moderate   GOALS: Goals reviewed with patient? Yes  SHORT TERM GOALS: Target date: 2 weeks, 12/20/23  I HEP Baseline: TBD Goal status: ONGOING 12/24/23 assigned last visit    LONG TERM GOALS: Target date: 12 weeks, 02/27/24 CORRECTED DATE 12/24/23  Improve gait speed from 0.34 m/sec to 1 m/sec with or without LAD Baseline:  Goal status: INITIAL  2.  30 sec sit to stand 10 reps or greater Baseline: 5 Goal status: ONGOING 12/24/23  3.  Modified oswestry improve from 70% disability to 59% or less Baseline:  Goal status: INITIAL  4.  Improve R hip flexion and ext strength from 3-/5 to 4/5 for less labored bed mobility Baseline:  Goal status: INITIAL  PLAN: PT FREQUENCY: 2x/week  PT DURATION: 12 weeks  PLANNED INTERVENTIONS: 97110-Therapeutic exercises, 97530- Therapeutic activity, 97112- Neuromuscular re-education, 97535- Self Care, 02859- Manual therapy, [phone]- Gait training, [phone]- Aquatic Therapy, [phone]- Ionotophoresis 4mg /ml Dexamethasone , Patient/Family education, Balance training, and Spinal mobilization.  PLAN FOR NEXT SESSION:   Land PT: soft tissue manual techniques R lumbar and glutes, SI, cardiovascular equipment, isolated strengthening B hips, balance   Water PT: per aquatic therapist reccs   Josette Rough, PT, DPT 12/26/23 9:30 AM

## 2023-12-27 NOTE — Telephone Encounter (Signed)
 Patient with diagnosis of afib on warfarin for anticoagulation.    Procedure: L4-L5 EPIDURAL STEROID INJECTION  Date of procedure: TBD   CHA2DS2-VASc Score = 3   This indicates a 3.2% annual risk of stroke. The patient's score is based upon: CHF History: 0 HTN History: 1 Diabetes History: 0 Stroke History: 0 Vascular Disease History: 0 Age Score: 1 Gender Score: 1      CrCl 53.9 ml/min Platelet count 223  Patient has not had an Afib/aflutter ablation or Watchman within the last 3 months or DCCV within the last 30 days   Per office protocol, patient can hold warfarin for 5 days prior to procedure.    Patient will NOT need bridging with Lovenox (enoxaparin) around procedure.  **This guidance is not considered finalized until pre-operative APP has relayed final recommendations.**\

## 2023-12-27 NOTE — Telephone Encounter (Signed)
 Jodie, you saw this patient 12/21/23 who now needs an epidural steroid injection. Do you recommend any further workup or may she proceed?  Thanks Angie

## 2023-12-28 NOTE — Telephone Encounter (Signed)
   Patient Name: Danielle Garrett  DOB: 1950/08/23 MRN: [id number]  Primary Cardiologist: Jerel Balding, MD  Chart reviewed as part of pre-operative protocol coverage. Given past medical history and time since last visit, based on ACC/AHA guidelines, [name] is at acceptable risk for the planned procedure without further cardiovascular testing.   Patient has not had an Afib/aflutter ablation or Watchman within the last 3 months or DCCV within the last 30 days    Per office protocol, patient can hold warfarin for 5 days prior to procedure.     Patient will NOT need bridging with Lovenox (enoxaparin) around procedure.  The patient was advised that if she develops new symptoms prior to surgery to contact our office to arrange for a follow-up visit, and she verbalized understanding.  I will route this recommendation to the requesting party via Epic fax function and remove from pre-op pool.  Please call with questions.  Lamarr Satterfield, NP 12/28/2023, 7:29 AM

## 2023-12-31 ENCOUNTER — Ambulatory Visit (HOSPITAL_BASED_OUTPATIENT_CLINIC_OR_DEPARTMENT_OTHER): Attending: Neurological Surgery | Admitting: Physical Therapy

## 2023-12-31 ENCOUNTER — Encounter (HOSPITAL_BASED_OUTPATIENT_CLINIC_OR_DEPARTMENT_OTHER): Payer: Self-pay | Admitting: Physical Therapy

## 2023-12-31 DIAGNOSIS — M5459 Other low back pain: Secondary | ICD-10-CM | POA: Insufficient documentation

## 2023-12-31 DIAGNOSIS — Z9181 History of falling: Secondary | ICD-10-CM | POA: Insufficient documentation

## 2023-12-31 DIAGNOSIS — M6281 Muscle weakness (generalized): Secondary | ICD-10-CM | POA: Diagnosis not present

## 2023-12-31 DIAGNOSIS — M25551 Pain in right hip: Secondary | ICD-10-CM | POA: Diagnosis not present

## 2023-12-31 DIAGNOSIS — R293 Abnormal posture: Secondary | ICD-10-CM | POA: Diagnosis not present

## 2023-12-31 NOTE — Therapy (Signed)
 OUTPATIENT PHYSICAL THERAPY THORACOLUMBAR TREATMENT   Patient Name: Danielle Garrett MRN: [id number] DOB:11/27/1950, 73 y.o., female Today's Date: 12/31/2023  END OF SESSION:  PT End of Session - 12/31/23 0926     Visit Number 5    Date for PT Re-Evaluation 02/28/24    Authorization Type Medicare    Progress Note Due on Visit 10    PT Start Time 0906    PT Stop Time 0945    PT Time Calculation (min) 39 min    Activity Tolerance Patient tolerated treatment well    Behavior During Therapy [facility] for tasks assessed/performed             Past Medical History:  Diagnosis Date   Allergic rhinitis due to other allergen    Anginal pain (HCC) 2011   hospitalized for CP in the past; due to low potassium   Anxiety    Arthritis    Childhood asthma    Chronic hoarseness    Gastroparesis    GERD (gastroesophageal reflux disease)    History of hiatal hernia    Hx of blood transfusion reaction    in the early 80s   Hypertension    Left bundle branch block     (abnormal heart rhythm)   PONV (postoperative nausea and vomiting)    Pyloric stenosis    Past Surgical History:  Procedure Laterality Date   ABDOMINAL HYSTERECTOMY  1983   CARDIAC CATHETERIZATION  10/2009   Cardiac cath normal coronary arteries   CERVICAL DISC ARTHROPLASTY     disc replacement in neck 1998   COLONOSCOPY     10 year repeat 09/26/2010   ESOPHAGOGASTRODUODENOSCOPY  09/26/2010,01/08/13   LOOP RECORDER INSERTION N/A 05/29/2016   Procedure: Loop Recorder Insertion;  Surgeon: Danelle LELON Birmingham, MD;  Location: [facility] INVASIVE [facility];  Service: Cardiovascular;  Laterality: N/A;   OTHER SURGICAL HISTORY  1983   hysterectomy ovaries intact, prolapsed    OTHER SURGICAL HISTORY  2006   pyloric stricture surgery   TOTAL HIP ARTHROPLASTY Right 06/07/2015   Procedure: TOTAL HIP ARTHROPLASTY ANTERIOR APPROACH;  Surgeon: Dempsey Sensor, MD;  Location: [facility] OR;  Service: Orthopedics;  Laterality: Right;   Patient Active Problem List    Diagnosis Date Noted   Hypertensive urgency 09/25/2022   Hypertension 06/15/2022   Encounter for therapeutic drug monitoring 05/08/2022   Autonomic dysfunction 09/28/2021   PAF (paroxysmal atrial fibrillation) (HCC) 05/29/2016   Syncope, cardiogenic 05/24/2016   Primary osteoarthritis of right hip 06/05/2015   Pre-operative clearance 06/02/2015   Chest pain 06/02/2015   LBBB (left bundle branch block) 06/02/2015   History of left hip replacement 06/02/2015   Palpitation 10/07/2014   Constipation 08/23/2013   Nausea 08/23/2013    PCP: Kip Righter, MD  REFERRING PROVIDER: Joshua Alm Hamilton, MD, neurology clinic  REFERRING DIAG: lumbar spine pain and radiculopathy  Rationale for Evaluation and Treatment: Rehabilitation  THERAPY DIAG:  Other low back pain  Pain in right hip  Muscle weakness (generalized)  History of falling  Abnormal posture  ONSET DATE: chronic , greater than 2 years  SUBJECTIVE:  SUBJECTIVE STATEMENT: Pt reports continued stiffness in lower back upon waking.    POOL ACCESS: currently none, but is considering [facility].   PERTINENT HISTORY:  Extensive history with syncopal episodes , once with resultant L tib fib fx.  H/o R ant THA 2 years ago, still reports residual hip pain  PAIN:  Are you having pain? Yes: NPRS scale: 4-5/10 Pain location: B low back  Pain description: stiffness Aggravating factors: inactivity  Relieving factors: moist heat   PRECAUTIONS: Other: syncopal episodes  RED FLAGS: None   WEIGHT BEARING RESTRICTIONS: No  FALLS:  Has patient fallen in last 6 months? Yes. Number of falls 1 due to syncope  LIVING ENVIRONMENT: Lives with: lives with their spouse Lives in: House/apartment Stairs: No Has following equipment at home:  Single point cane and Environmental consultant - 2 wheeled  OCCUPATION: retired  PLOF: Independent  PATIENT GOALS: get better movement for better quality of life  NEXT MD VISIT: unclear when returns to referring MD  OBJECTIVE:  Note: Objective measures were completed at Evaluation unless otherwise noted.  DIAGNOSTIC FINDINGS:  Degenerative disc disease L4/5 and facet arthropathy L 3-4, L4-5 and vestigal disc L 5  PATIENT SURVEYS:  Modified Oswestry Low Back Pain Disability Questionnaire: 35 / 50 = 70.0 %  COGNITION: Overall cognitive status: Within functional limits for tasks assessed     SENSATION: WFL  MUSCLE LENGTH: Hamstrings: Right wfl deg; Left wfl deg Debby test: Right wfl deg; Left wfl deg  POSTURE: standing , L iliac crest 1/2  to 1 lower than R,mild R pelvic shift ,mid thoracic kyphosis noted  PALPATION: Exquisitely tender R post SI jt line, also R gluteus minimus, medius PA glides lumbar non provoking, patient reports some pain relief with pressure  Poor PA mobility lower thoracic spine noted LUMBAR ROM:   AROM eval  Flexion wfl  Extension 10  Right lateral flexion Nt due to balance  Left lateral flexion nt  Right rotation nt  Left rotation nt   (Blank rows = not tested)  LOWER EXTREMITY ROM:   hip ROM B wnl   LOWER EXTREMITY MMT: noted instability, very poor control and balance with toe walking, 3+/5 strength B plantarflexors Bridging B 1/2 available ROM, 3-/5 B hip extensors R hip flexion p!  , 3-/5  Coordination testing B ankles wnl  LUMBAR SPECIAL TESTS:  Straight leg raise test: Negative and Thomas test: Negative  FUNCTIONAL TESTS:    30 sec sit to stand, 5 reps, labored; 12/24/23 9 reps labored no UEs  Gait speed, 3 m in 8.73 sec or 0.34 m/sec   Bed mobility, labored, slow for rolling side to side and for supine to sit and sit to supine   GAIT: No device, trendelenberg R, maintains flexed posture through trunk approximately 20 degrees, guarded, slow  gait speed, decreased stance time on R, limited R hip extension in terminal stance/pre swing on R.  Noted that pt  inadvertently turned head to L while weight bearing on R and had sharp pain R SI causing her to stop for a few mins  TREATMENT DATE:  [facility] Adult PT Treatment:                                             Date: 12/31/23 Pt seen for aquatic therapy today.  Treatment took place in water 3.5-4.75 ft in depth  at the [facility] pool. Temp of water was 91.  Pt entered/exited the pool via stairs independently with bil rail.  - Intro to aquatic therapy principles - UE on barbell walking forward/ backward / sideways - side stepping with horz abdct/add with yellow hand floats on top of water  - L stretch with hands on rails x 20 sec x 2 with gentle tail wag - UE on wall:  toe/heel raises x 15; hip add/abdct x 10; relaxed squat x 5; alternating hip ext x 5 each; knee to chest stretch x 20s  Pt requires the buoyancy and hydrostatic pressure of water for support, and to offload joints by unweighting joint load by at least 50 % in navel deep water and by at least 75-80% in chest to neck deep water.  Viscosity of the water is needed for resistance of strengthening. Water current perturbations provides challenge to standing balance requiring increased core activation.     12/26/23  Scifit bike L4x8 minutes (Nustep not available) for w/u   QL stretch forward with stool 3x30 seconds QL stretch forward  with stool + R/L bias 1x30 seconds B  Frog stretch supine 3x30 seconds B DKTC 5x10 seconds with towel behind knees Figure 4 stretch 2x30 seconds B  Wide tandem solid surface 3x30 seconds B min guard  Narrow BOS 4x30 seconds one time MinA due to posterior LOB  Side steps at TM rail min guard x4 laps down and back min guard    12/24/23  Nustep L4x8 minutes seat 4, all four extremities  30 second STS test, goals  Lumbar rotations stretches 3x10 seconds B (limited due to R groin  pain) SKTC 5x5 seconds B HS stretches- attempted but caused radicular sx in low back  PPT 15x3 seconds  PPT + march x12  Bridge + ABD into green TB x10 Sidelying clams green TB x10 B Hip hikes x12 B Hip hikes + ABD x10 B Forward QL stretch with stool 1x30 seconds                                                                                                                            PATIENT EDUCATION:  Education details: intro to aquatic therapy  Person educated: Patient Education method: Explanation, Demonstration, Tactile cues, Verbal cues Education comprehension: verbalized understanding, returned demonstration, verbal cues required, tactile cues required, and needs further education  HOME EXERCISE PROGRAM:  Access Code: [email or web address] URL: [email or web address] Date: 12/26/2023 Prepared by: Josette Rough  Exercises - Seated Piriformis Stretch with Trunk Bend  - 2 x daily - 7 x weekly - 2 reps - 30 hold - Seated Hamstring Stretch  - 2 x daily - 7 x weekly - 2 reps - 30 hold - Supine Lower Trunk Rotation  - 2 x daily - 7 x weekly - 1 sets - 10 reps - Supine Piriformis Stretch with Foot on Ground  - 2 x daily - 7 x weekly - 2 reps -  30 hold - Supine Bridge  - 1 x daily - 7 x weekly - 2 sets - 10 reps - Supine Double Knee to Chest Modified  - 2 x daily - 7 x weekly - 1 sets - 5 reps - 10 seconds  hold - Wide Tandem Stance with Eyes Open  - 1 x daily - 7 x weekly - 1-2 sets - 6 reps - Standing Narrow Base of Support  - 1 x daily - 7 x weekly - 1-2 sets - 5 reps   ASSESSMENT:  CLINICAL IMPRESSION: Pt demonstrates safety and independence in aquatic setting with therapist instructing from deck. She is confident in setting, moving throughout all depths easily, but requires UE support on barbell for improved balance and gait quality.  Pt is directed through various movement patterns in standing position. Pt will benefit from work on stair ascending/descending in water  along with balance exercises.  Pain reduced in relaxed squat position when submerged to neck, but otherwise lower back stiffness remained unchanged.  Goals are ongoing.     OBJECTIVE IMPAIRMENTS: decreased activity tolerance, decreased balance, decreased endurance, decreased mobility, difficulty walking, decreased ROM, decreased strength, hypomobility, impaired perceived functional ability, impaired flexibility, postural dysfunction, and pain.   ACTIVITY LIMITATIONS: carrying, lifting, bending, standing, squatting, sleeping, stairs, transfers, and bed mobility  PARTICIPATION LIMITATIONS: school  PERSONAL FACTORS: Age, Behavior pattern, Past/current experiences, Time since onset of injury/illness/exacerbation, and 1-2 comorbidities: h/o syncopal episodes and falls, h/o R THA  are also affecting patient's functional outcome.   REHAB POTENTIAL: Good  CLINICAL DECISION MAKING: Evolving/moderate complexity  EVALUATION COMPLEXITY: Moderate   GOALS: Goals reviewed with patient? Yes  SHORT TERM GOALS: Target date: 2 weeks, 12/20/23  I HEP Baseline: TBD Goal status: ONGOING 12/24/23 assigned last visit    LONG TERM GOALS: Target date: 12 weeks, 02/27/24 CORRECTED DATE 12/24/23  Improve gait speed from 0.34 m/sec to 1 m/sec with or without LAD Baseline:  Goal status: INITIAL  2.  30 sec sit to stand 10 reps or greater Baseline: 5 Goal status: ONGOING 12/24/23  3.  Modified oswestry improve from 70% disability to 59% or less Baseline:  Goal status: INITIAL  4.  Improve R hip flexion and ext strength from 3-/5 to 4/5 for less labored bed mobility Baseline:  Goal status: INITIAL  PLAN: PT FREQUENCY: 2x/week  PT DURATION: 12 weeks  PLANNED INTERVENTIONS: 97110-Therapeutic exercises, 97530- Therapeutic activity, 97112- Neuromuscular re-education, 97535- Self Care, 02859- Manual therapy, [phone]- Gait training, [phone]- Aquatic Therapy, [phone]- Ionotophoresis 4mg /ml Dexamethasone ,  Patient/Family education, Balance training, and Spinal mobilization.  PLAN FOR NEXT SESSION:   Land PT: soft tissue manual techniques R lumbar and glutes, SI, cardiovascular equipment, isolated strengthening B hips, balance   Water PT: per aquatic therapist recs   Delon Aquas, PTA 12/31/23 10:01 AM [facility] [address] [place], [place], [address] Phone: [phone]   Fax:  [phone]

## 2024-01-03 ENCOUNTER — Ambulatory Visit (HOSPITAL_BASED_OUTPATIENT_CLINIC_OR_DEPARTMENT_OTHER): Admitting: Physical Therapy

## 2024-01-04 ENCOUNTER — Ambulatory Visit

## 2024-01-07 ENCOUNTER — Ambulatory Visit (INDEPENDENT_AMBULATORY_CARE_PROVIDER_SITE_OTHER)

## 2024-01-07 DIAGNOSIS — I4891 Unspecified atrial fibrillation: Secondary | ICD-10-CM

## 2024-01-07 LAB — CUP PACEART REMOTE DEVICE CHECK
Date Time Interrogation Session: 20250921233623
Implantable Pulse Generator Implant Date: 20240813

## 2024-01-08 ENCOUNTER — Ambulatory Visit (HOSPITAL_BASED_OUTPATIENT_CLINIC_OR_DEPARTMENT_OTHER): Admitting: Physical Therapy

## 2024-01-08 NOTE — Progress Notes (Signed)
 Remote Loop Recorder Transmission

## 2024-01-10 ENCOUNTER — Ambulatory Visit (HOSPITAL_BASED_OUTPATIENT_CLINIC_OR_DEPARTMENT_OTHER): Admitting: Physical Therapy

## 2024-01-10 DIAGNOSIS — I4891 Unspecified atrial fibrillation: Secondary | ICD-10-CM | POA: Diagnosis not present

## 2024-01-10 DIAGNOSIS — I10 Essential (primary) hypertension: Secondary | ICD-10-CM | POA: Diagnosis not present

## 2024-01-10 DIAGNOSIS — I480 Paroxysmal atrial fibrillation: Secondary | ICD-10-CM | POA: Diagnosis not present

## 2024-01-12 ENCOUNTER — Ambulatory Visit: Payer: Self-pay | Admitting: Internal Medicine

## 2024-01-14 ENCOUNTER — Ambulatory Visit (HOSPITAL_BASED_OUTPATIENT_CLINIC_OR_DEPARTMENT_OTHER): Admitting: Physical Therapy

## 2024-01-14 ENCOUNTER — Encounter (HOSPITAL_BASED_OUTPATIENT_CLINIC_OR_DEPARTMENT_OTHER): Payer: Self-pay | Admitting: Physical Therapy

## 2024-01-14 DIAGNOSIS — M5459 Other low back pain: Secondary | ICD-10-CM

## 2024-01-14 DIAGNOSIS — M25551 Pain in right hip: Secondary | ICD-10-CM | POA: Diagnosis not present

## 2024-01-14 DIAGNOSIS — M6281 Muscle weakness (generalized): Secondary | ICD-10-CM

## 2024-01-14 DIAGNOSIS — R293 Abnormal posture: Secondary | ICD-10-CM

## 2024-01-14 DIAGNOSIS — Z9181 History of falling: Secondary | ICD-10-CM

## 2024-01-14 NOTE — Therapy (Signed)
 OUTPATIENT PHYSICAL THERAPY THORACOLUMBAR TREATMENT   Patient Name: Danielle Garrett MRN: [id number] DOB:[birth year], 73 y.o., female Today's Date: 01/14/2024  END OF SESSION:  PT End of Session - 01/14/24 1002     Visit Number 6    Date for Recertification  02/28/24    Authorization Type Medicare    Progress Note Due on Visit 10    PT Start Time 0923    PT Stop Time 1008    PT Time Calculation (min) 45 min    Activity Tolerance Patient tolerated treatment well    Behavior During Therapy [facility] for tasks assessed/performed              Past Medical History:  Diagnosis Date   Allergic rhinitis due to other allergen    Anginal pain 2011   hospitalized for CP in the past; due to low potassium   Anxiety    Arthritis    Childhood asthma    Chronic hoarseness    Gastroparesis    GERD (gastroesophageal reflux disease)    History of hiatal hernia    Hx of blood transfusion reaction    in the early 80s   Hypertension    Left bundle branch block     (abnormal heart rhythm)   PONV (postoperative nausea and vomiting)    Pyloric stenosis    Past Surgical History:  Procedure Laterality Date   ABDOMINAL HYSTERECTOMY  1983   CARDIAC CATHETERIZATION  10/2009   Cardiac cath normal coronary arteries   CERVICAL DISC ARTHROPLASTY     disc replacement in neck 1998   COLONOSCOPY     10 year repeat 09/26/2010   ESOPHAGOGASTRODUODENOSCOPY  09/26/2010,01/08/13   LOOP RECORDER INSERTION N/A 05/29/2016   Procedure: Loop Recorder Insertion;  Surgeon: Danelle LELON Birmingham, MD;  Location: [facility] INVASIVE [facility];  Service: Cardiovascular;  Laterality: N/A;   OTHER SURGICAL HISTORY  1983   hysterectomy ovaries intact, prolapsed    OTHER SURGICAL HISTORY  2006   pyloric stricture surgery   TOTAL HIP ARTHROPLASTY Right 06/07/2015   Procedure: TOTAL HIP ARTHROPLASTY ANTERIOR APPROACH;  Surgeon: Dempsey Sensor, MD;  Location: [facility] OR;  Service: Orthopedics;  Laterality: Right;   Patient Active Problem List    Diagnosis Date Noted   Hypertensive urgency 09/25/2022   Hypertension 06/15/2022   Encounter for therapeutic drug monitoring 05/08/2022   Autonomic dysfunction 09/28/2021   PAF (paroxysmal atrial fibrillation) (HCC) 05/29/2016   Syncope, cardiogenic 05/24/2016   Primary osteoarthritis of right hip 06/05/2015   Pre-operative clearance 06/02/2015   Chest pain 06/02/2015   LBBB (left bundle branch block) 06/02/2015   History of left hip replacement 06/02/2015   Palpitation 10/07/2014   Constipation 08/23/2013   Nausea 08/23/2013    PCP: Kip Righter, MD  REFERRING PROVIDER: Joshua Alm Hamilton, MD, neurology clinic  REFERRING DIAG: lumbar spine pain and radiculopathy  Rationale for Evaluation and Treatment: Rehabilitation  THERAPY DIAG:  Other low back pain  Pain in right hip  Muscle weakness (generalized)  History of falling  Abnormal posture  ONSET DATE: chronic , greater than 2 years  SUBJECTIVE:  SUBJECTIVE STATEMENT: Pt reports she joined [employer] and has gone to the pool 2x with husband. She reports she did all of the exercises in pool that she was instructed on the last session.  She signed up for balance class and chair yoga.  Pt reports that her back doesn't feel as heavy when she is moving in the water.   POOL ACCESS: joined [facility].   PERTINENT HISTORY:  Extensive history with syncopal episodes , once with resultant L tib fib fx.  H/o R ant THA 2 years ago, still reports residual hip pain  PAIN:  Are you having pain? no: NPRS scale: 0/10 Pain location: B low back  Pain description: just stiffness, like a vice Aggravating factors: inactivity  Relieving factors: moist heat   PRECAUTIONS: Other: syncopal episodes  RED FLAGS: None   WEIGHT BEARING RESTRICTIONS:  No  FALLS:  Has patient fallen in last 6 months? Yes. Number of falls 1 due to syncope  LIVING ENVIRONMENT: Lives with: lives with their spouse Lives in: House/apartment Stairs: No Has following equipment at home: Single point cane and Environmental consultant - 2 wheeled  OCCUPATION: retired  PLOF: Independent  PATIENT GOALS: get better movement for better quality of life  NEXT MD VISIT: unclear when returns to referring MD  OBJECTIVE:  Note: Objective measures were completed at Evaluation unless otherwise noted.  DIAGNOSTIC FINDINGS:  Degenerative disc disease L4/5 and facet arthropathy L 3-4, L4-5 and vestigal disc L 5  PATIENT SURVEYS:  Modified Oswestry Low Back Pain Disability Questionnaire: 35 / 50 = 70.0 %  COGNITION: Overall cognitive status: Within functional limits for tasks assessed     SENSATION: WFL  MUSCLE LENGTH: Hamstrings: Right wfl deg; Left wfl deg Debby test: Right wfl deg; Left wfl deg  POSTURE: standing , L iliac crest 1/2  to 1 lower than R,mild R pelvic shift ,mid thoracic kyphosis noted  PALPATION: Exquisitely tender R post SI jt line, also R gluteus minimus, medius PA glides lumbar non provoking, patient reports some pain relief with pressure  Poor PA mobility lower thoracic spine noted LUMBAR ROM:   AROM eval  Flexion wfl  Extension 10  Right lateral flexion Nt due to balance  Left lateral flexion nt  Right rotation nt  Left rotation nt   (Blank rows = not tested)  LOWER EXTREMITY ROM:   hip ROM B wnl   LOWER EXTREMITY MMT: noted instability, very poor control and balance with toe walking, 3+/5 strength B plantarflexors Bridging B 1/2 available ROM, 3-/5 B hip extensors R hip flexion p!  , 3-/5  Coordination testing B ankles wnl  LUMBAR SPECIAL TESTS:  Straight leg raise test: Negative and Thomas test: Negative  FUNCTIONAL TESTS:    30 sec sit to stand, 5 reps, labored; 12/24/23 9 reps labored no UEs  Gait speed, 3 m in 8.73 sec or 0.34  m/sec   Bed mobility, labored, slow for rolling side to side and for supine to sit and sit to supine   GAIT: No device, trendelenberg R, maintains flexed posture through trunk approximately 20 degrees, guarded, slow gait speed, decreased stance time on R, limited R hip extension in terminal stance/pre swing on R.  Noted that pt  inadvertently turned head to L while weight bearing on R and had sharp pain R SI causing her to stop for a few mins  TREATMENT DATE:  [facility] Adult PT Treatment:  Date: 01/14/24 Pt seen for aquatic therapy today.  Treatment took place in water 3.5-4.75 ft in depth at the [facility] pool. Temp of water was 91.  Pt entered/exited the pool via stairs independently with bil rail.  - UE on barbell walking forward/ backward / sideways -> without UE support - unable to complete tandem gait or grapevine due to unsteadiness -> grapevine at wall with UE support - SLS with light UE support on wall x 15sec (challenge!) -> semi tandem stance with light UE on wall x 15sec x 2 (challenge) - UE on wall:   hip add/abdct x 10; knee to chest stretch x 20s; toe/heel raises x 15; - return to walking forward/ backward with UE on yellow hand floats with cues for even step length (unsteady with increased Rt retro step length) - alternating toe taps to first step with light UE support on rails  [facility] Adult PT Treatment:                                             Date: 12/31/23 Pt seen for aquatic therapy today.  Treatment took place in water 3.5-4.75 ft in depth at the [facility] pool. Temp of water was 91.  Pt entered/exited the pool via stairs independently with bil rail.  - Intro to aquatic therapy principles - UE on barbell walking forward/ backward / sideways - side stepping with horz abdct/add with yellow hand floats on top of water  - L stretch with hands on rails x 20 sec x 2 with gentle tail wag - UE on wall:  toe/heel  raises x 15; hip add/abdct x 10; relaxed squat x 5; alternating hip ext x 5 each; knee to chest stretch x 20s  Pt requires the buoyancy and hydrostatic pressure of water for support, and to offload joints by unweighting joint load by at least 50 % in navel deep water and by at least 75-80% in chest to neck deep water.  Viscosity of the water is needed for resistance of strengthening. Water current perturbations provides challenge to standing balance requiring increased core activation.     12/26/23  Scifit bike L4x8 minutes (Nustep not available) for w/u   QL stretch forward with stool 3x30 seconds QL stretch forward  with stool + R/L bias 1x30 seconds B  Frog stretch supine 3x30 seconds B DKTC 5x10 seconds with towel behind knees Figure 4 stretch 2x30 seconds B  Wide tandem solid surface 3x30 seconds B min guard  Narrow BOS 4x30 seconds one time MinA due to posterior LOB  Side steps at TM rail min guard x4 laps down and back min guard    12/24/23  Nustep L4x8 minutes seat 4, all four extremities  30 second STS test, goals  Lumbar rotations stretches 3x10 seconds B (limited due to R groin pain) SKTC 5x5 seconds B HS stretches- attempted but caused radicular sx in low back  PPT 15x3 seconds  PPT + march x12  Bridge + ABD into green TB x10 Sidelying clams green TB x10 B Hip hikes x12 B Hip hikes + ABD x10 B Forward QL stretch with stool 1x30 seconds  PATIENT EDUCATION:  Education details: intro to aquatic therapy  Person educated: Patient Education method: Explanation, Demonstration, Tactile cues, Verbal cues Education comprehension: verbalized understanding, returned demonstration, verbal cues required, tactile cues required, and needs further education  HOME EXERCISE PROGRAM:  Access Code: [email or web address] URL: [email or web address] Date:  12/26/2023 Prepared by: Josette Rough  Exercises - Seated Piriformis Stretch with Trunk Bend  - 2 x daily - 7 x weekly - 2 reps - 30 hold - Seated Hamstring Stretch  - 2 x daily - 7 x weekly - 2 reps - 30 hold - Supine Lower Trunk Rotation  - 2 x daily - 7 x weekly - 1 sets - 10 reps - Supine Piriformis Stretch with Foot on Ground  - 2 x daily - 7 x weekly - 2 reps - 30 hold - Supine Bridge  - 1 x daily - 7 x weekly - 2 sets - 10 reps - Supine Double Knee to Chest Modified  - 2 x daily - 7 x weekly - 1 sets - 5 reps - 10 seconds  hold - Wide Tandem Stance with Eyes Open  - 1 x daily - 7 x weekly - 1-2 sets - 6 reps - Standing Narrow Base of Support  - 1 x daily - 7 x weekly - 1-2 sets - 5 reps   ASSESSMENT:  CLINICAL IMPRESSION: Pt requires UE support for SLS and narrow base of support challenges. She reported increased tightness in lower back when working on these positions. Pt will benefit from work on stair ascending/descending in water along with balance exercises. Tightness of back reduced with relaxed squat position. Goals are ongoing. Plan to check 30sec STS next visit.    OBJECTIVE IMPAIRMENTS: decreased activity tolerance, decreased balance, decreased endurance, decreased mobility, difficulty walking, decreased ROM, decreased strength, hypomobility, impaired perceived functional ability, impaired flexibility, postural dysfunction, and pain.   ACTIVITY LIMITATIONS: carrying, lifting, bending, standing, squatting, sleeping, stairs, transfers, and bed mobility  PARTICIPATION LIMITATIONS: school  PERSONAL FACTORS: Age, Behavior pattern, Past/current experiences, Time since onset of injury/illness/exacerbation, and 1-2 comorbidities: h/o syncopal episodes and falls, h/o R THA  are also affecting patient's functional outcome.   REHAB POTENTIAL: Good  CLINICAL DECISION MAKING: Evolving/moderate complexity  EVALUATION COMPLEXITY: Moderate   GOALS: Goals reviewed with patient?  Yes  SHORT TERM GOALS: Target date: 2 weeks, 12/20/23  I HEP Baseline: TBD Goal status: ONGOING 12/24/23 assigned last visit    LONG TERM GOALS: Target date: 12 weeks, 02/27/24 CORRECTED DATE 12/24/23  Improve gait speed from 0.34 m/sec to 1 m/sec with or without LAD Baseline:  Goal status: INITIAL  2.  30 sec sit to stand 10 reps or greater Baseline: 5 Goal status: ONGOING 12/24/23  3.  Modified oswestry improve from 70% disability to 59% or less Baseline:  Goal status: INITIAL  4.  Improve R hip flexion and ext strength from 3-/5 to 4/5 for less labored bed mobility Baseline:  Goal status: INITIAL  PLAN: PT FREQUENCY: 2x/week  PT DURATION: 12 weeks  PLANNED INTERVENTIONS: 97110-Therapeutic exercises, 97530- Therapeutic activity, 97112- Neuromuscular re-education, 97535- Self Care, 02859- Manual therapy, [phone]- Gait training, [phone]- Aquatic Therapy, [phone]- Ionotophoresis 4mg /ml Dexamethasone , Patient/Family education, Balance training, and Spinal mobilization.  PLAN FOR NEXT SESSION:   Land PT: soft tissue manual techniques R lumbar and glutes, SI, cardiovascular equipment, isolated strengthening B hips, balance   Water PT: per aquatic therapist recs   Delon Aquas, PTA 01/14/24 10:17 AM [facility]  [address] [place], [place], [address] Phone: [phone]   Fax:  [phone]

## 2024-01-15 DIAGNOSIS — E785 Hyperlipidemia, unspecified: Secondary | ICD-10-CM | POA: Diagnosis not present

## 2024-01-15 DIAGNOSIS — I480 Paroxysmal atrial fibrillation: Secondary | ICD-10-CM | POA: Diagnosis not present

## 2024-01-15 DIAGNOSIS — I4891 Unspecified atrial fibrillation: Secondary | ICD-10-CM | POA: Diagnosis not present

## 2024-01-15 DIAGNOSIS — I10 Essential (primary) hypertension: Secondary | ICD-10-CM | POA: Diagnosis not present

## 2024-01-16 ENCOUNTER — Ambulatory Visit (HOSPITAL_BASED_OUTPATIENT_CLINIC_OR_DEPARTMENT_OTHER): Attending: Neurological Surgery | Admitting: Physical Therapy

## 2024-01-16 ENCOUNTER — Ambulatory Visit

## 2024-01-16 ENCOUNTER — Encounter (HOSPITAL_BASED_OUTPATIENT_CLINIC_OR_DEPARTMENT_OTHER): Payer: Self-pay | Admitting: Physical Therapy

## 2024-01-16 DIAGNOSIS — M25551 Pain in right hip: Secondary | ICD-10-CM | POA: Insufficient documentation

## 2024-01-16 DIAGNOSIS — M6281 Muscle weakness (generalized): Secondary | ICD-10-CM | POA: Insufficient documentation

## 2024-01-16 DIAGNOSIS — M5459 Other low back pain: Secondary | ICD-10-CM | POA: Diagnosis present

## 2024-01-16 NOTE — Therapy (Signed)
 OUTPATIENT PHYSICAL THERAPY THORACOLUMBAR TREATMENT   Patient Name: Danielle Garrett MRN: [id number] DOB:24-Feb-1951, 73 y.o., female Today's Date: 01/16/2024  END OF SESSION:  PT End of Session - 01/16/24 0948     Visit Number 7    Date for Recertification  02/28/24    Authorization Type Medicare    Progress Note Due on Visit 10    PT Start Time 0930    PT Stop Time 1010    PT Time Calculation (min) 40 min    Activity Tolerance Patient tolerated treatment well    Behavior During Therapy [facility] for tasks assessed/performed               Past Medical History:  Diagnosis Date   Allergic rhinitis due to other allergen    Anginal pain 2011   hospitalized for CP in the past; due to low potassium   Anxiety    Arthritis    Childhood asthma    Chronic hoarseness    Gastroparesis    GERD (gastroesophageal reflux disease)    History of hiatal hernia    Hx of blood transfusion reaction    in the early 80s   Hypertension    Left bundle branch block     (abnormal heart rhythm)   PONV (postoperative nausea and vomiting)    Pyloric stenosis    Past Surgical History:  Procedure Laterality Date   ABDOMINAL HYSTERECTOMY  1983   CARDIAC CATHETERIZATION  10/2009   Cardiac cath normal coronary arteries   CERVICAL DISC ARTHROPLASTY     disc replacement in neck 1998   COLONOSCOPY     10 year repeat 09/26/2010   ESOPHAGOGASTRODUODENOSCOPY  09/26/2010,01/08/13   LOOP RECORDER INSERTION N/A 05/29/2016   Procedure: Loop Recorder Insertion;  Surgeon: Danelle LELON Birmingham, MD;  Location: [facility] INVASIVE CV LAB;  Service: Cardiovascular;  Laterality: N/A;   OTHER SURGICAL HISTORY  1983   hysterectomy ovaries intact, prolapsed    OTHER SURGICAL HISTORY  2006   pyloric stricture surgery   TOTAL HIP ARTHROPLASTY Right 06/07/2015   Procedure: TOTAL HIP ARTHROPLASTY ANTERIOR APPROACH;  Surgeon: Dempsey Sensor, MD;  Location: [facility] OR;  Service: Orthopedics;  Laterality: Right;   Patient Active Problem List    Diagnosis Date Noted   Hypertensive urgency 09/25/2022   Hypertension 06/15/2022   Encounter for therapeutic drug monitoring 05/08/2022   Autonomic dysfunction 09/28/2021   PAF (paroxysmal atrial fibrillation) (HCC) 05/29/2016   Syncope, cardiogenic 05/24/2016   Primary osteoarthritis of right hip 06/05/2015   Pre-operative clearance 06/02/2015   Chest pain 06/02/2015   LBBB (left bundle branch block) 06/02/2015   History of left hip replacement 06/02/2015   Palpitation 10/07/2014   Constipation 08/23/2013   Nausea 08/23/2013    PCP: Kip Righter, MD  REFERRING PROVIDER: Joshua Alm Hamilton, MD, neurology clinic  REFERRING DIAG: lumbar spine pain and radiculopathy  Rationale for Evaluation and Treatment: Rehabilitation  THERAPY DIAG:  Other low back pain  Pain in right hip  Muscle weakness (generalized)  ONSET DATE: chronic , greater than 2 years  SUBJECTIVE:  SUBJECTIVE STATEMENT: Pt reports she is planning to take mobility class at [facility] today. She receives injections in back tomorrow; she is hopeful it will help her.   POOL ACCESS: joined [facility].   PERTINENT HISTORY:  Extensive history with syncopal episodes , once with resultant L tib fib fx.  H/o R ant THA 2 years ago, still reports residual hip pain  PAIN:  Are you having pain? no: NPRS scale: 0/10 Pain location: B low back  Pain description: just stiffness, like a vice Aggravating factors: inactivity  Relieving factors: moist heat   PRECAUTIONS: Other: syncopal episodes  RED FLAGS: None   WEIGHT BEARING RESTRICTIONS: No  FALLS:  Has patient fallen in last 6 months? Yes. Number of falls 1 due to syncope  LIVING ENVIRONMENT: Lives with: lives with their spouse Lives in: House/apartment Stairs: No Has  following equipment at home: Single point cane and Environmental consultant - 2 wheeled  OCCUPATION: retired  PLOF: Independent  PATIENT GOALS: get better movement for better quality of life  NEXT MD VISIT: unclear when returns to referring MD  OBJECTIVE:  Note: Objective measures were completed at Evaluation unless otherwise noted.  DIAGNOSTIC FINDINGS:  Degenerative disc disease L4/5 and facet arthropathy L 3-4, L4-5 and vestigal disc L 5  PATIENT SURVEYS:  Modified Oswestry Low Back Pain Disability Questionnaire: 35 / 50 = 70.0 %  COGNITION: Overall cognitive status: Within functional limits for tasks assessed     SENSATION: WFL  MUSCLE LENGTH: Hamstrings: Right wfl deg; Left wfl deg Debby test: Right wfl deg; Left wfl deg  POSTURE: standing , L iliac crest 1/2  to 1 lower than R,mild R pelvic shift ,mid thoracic kyphosis noted  PALPATION: Exquisitely tender R post SI jt line, also R gluteus minimus, medius PA glides lumbar non provoking, patient reports some pain relief with pressure  Poor PA mobility lower thoracic spine noted LUMBAR ROM:   AROM eval  Flexion wfl  Extension 10  Right lateral flexion Nt due to balance  Left lateral flexion nt  Right rotation nt  Left rotation nt   (Blank rows = not tested)  LOWER EXTREMITY ROM:   hip ROM B wnl   LOWER EXTREMITY MMT: noted instability, very poor control and balance with toe walking, 3+/5 strength B plantarflexors Bridging B 1/2 available ROM, 3-/5 B hip extensors R hip flexion p!  , 3-/5  Coordination testing B ankles wnl  LUMBAR SPECIAL TESTS:  Straight leg raise test: Negative and Thomas test: Negative  FUNCTIONAL TESTS:    30 sec sit to stand, 5 reps, labored; Gait speed, 3 m in 8.73 sec or 0.34 m/sec   Bed mobility, labored, slow for rolling side to side and for supine to sit and sit to supine     12/24/23 30sec STS: 9 reps labored no UEs    01/16/24: 30s STS: 9 reps, no UE, labored for last 3 reps  GAIT: No  device, trendelenberg R, maintains flexed posture through trunk approximately 20 degrees, guarded, slow gait speed, decreased stance time on R, limited R hip extension in terminal stance/pre swing on R.  Noted that pt  inadvertently turned head to L while weight bearing on R and had sharp pain R SI causing her to stop for a few mins  TREATMENT DATE:  [facility] Adult PT Treatment:  Date: 01/16/24 Pt seen for aquatic therapy today.  Treatment took place in water 3.5-4.75 ft in depth at the [facility] pool. Temp of water was 91.  Pt entered/exited the pool via stairs independently with bil rail.  -30s STS test - standing quad stretch with foot on 2nd step, 20s x 2 each LE - UE on yellow hand floats walking forward/ backward -sideways with arm horz add/abdct with yellow hand floats - forward marching with hands resting on top of water  - forward step ups, with UE on rails x 5 each LE (fatigues quickly) - lateral step ups, UE on rails x 5 each LE (fatigues quickly) - decompression with yellow noodle under arms behind back, UE on corner: cycling, hip abdct/add, hip flexion/extension - staggered stance balance UE on top of water (challenge, but improved from semi-tandem stance)   [facility] Adult PT Treatment:                                             Date: 01/14/24 Pt seen for aquatic therapy today.  Treatment took place in water 3.5-4.75 ft in depth at the [facility] pool. Temp of water was 91.  Pt entered/exited the pool via stairs independently with bil rail.  - UE on barbell walking forward/ backward / sideways -> without UE support - unable to complete tandem gait or grapevine due to unsteadiness -> grapevine at wall with UE support - SLS with light UE support on wall x 15sec (challenge!) -> semi tandem stance with light UE on wall x 15sec x 2 (challenge) - UE on wall:   hip add/abdct x 10; knee to chest stretch x 20s; toe/heel raises x  15; - return to walking forward/ backward with UE on yellow hand floats with cues for even step length (unsteady with increased Rt retro step length) - alternating toe taps to first step with light UE support on rails  [facility] Adult PT Treatment:                                             Date: 12/31/23 Pt seen for aquatic therapy today.  Treatment took place in water 3.5-4.75 ft in depth at the [facility] pool. Temp of water was 91.  Pt entered/exited the pool via stairs independently with bil rail.  - Intro to aquatic therapy principles - UE on barbell walking forward/ backward / sideways - side stepping with horz abdct/add with yellow hand floats on top of water  - L stretch with hands on rails x 20 sec x 2 with gentle tail wag - UE on wall:  toe/heel raises x 15; hip add/abdct x 10; relaxed squat x 5; alternating hip ext x 5 each; knee to chest stretch x 20s  Pt requires the buoyancy and hydrostatic pressure of water for support, and to offload joints by unweighting joint load by at least 50 % in navel deep water and by at least 75-80% in chest to neck deep water.  Viscosity of the water is needed for resistance of strengthening. Water current perturbations provides challenge to standing balance requiring increased core activation.     12/26/23  Scifit bike L4x8 minutes (Nustep not available) for w/u   QL stretch forward with stool 3x30  seconds QL stretch forward  with stool + R/L bias 1x30 seconds B  Frog stretch supine 3x30 seconds B DKTC 5x10 seconds with towel behind knees Figure 4 stretch 2x30 seconds B  Wide tandem solid surface 3x30 seconds B min guard  Narrow BOS 4x30 seconds one time MinA due to posterior LOB  Side steps at TM rail min guard x4 laps down and back min guard    12/24/23  Nustep L4x8 minutes seat 4, all four extremities  30 second STS test, goals  Lumbar rotations stretches 3x10 seconds B (limited due to R groin pain) SKTC 5x5 seconds B HS  stretches- attempted but caused radicular sx in low back  PPT 15x3 seconds  PPT + march x12  Bridge + ABD into green TB x10 Sidelying clams green TB x10 B Hip hikes x12 B Hip hikes + ABD x10 B Forward QL stretch with stool 1x30 seconds                                                                                                                            PATIENT EDUCATION:  Education details: intro to aquatic therapy  Person educated: Patient Education method: Explanation, Demonstration, Tactile cues, Verbal cues Education comprehension: verbalized understanding, returned demonstration, verbal cues required, tactile cues required, and needs further education  HOME EXERCISE PROGRAM:  Access Code: [email or web address] URL: [email or web address] Date: 12/26/2023 Prepared by: Josette Rough  Exercises - Seated Piriformis Stretch with Trunk Bend  - 2 x daily - 7 x weekly - 2 reps - 30 hold - Seated Hamstring Stretch  - 2 x daily - 7 x weekly - 2 reps - 30 hold - Supine Lower Trunk Rotation  - 2 x daily - 7 x weekly - 1 sets - 10 reps - Supine Piriformis Stretch with Foot on Ground  - 2 x daily - 7 x weekly - 2 reps - 30 hold - Supine Bridge  - 1 x daily - 7 x weekly - 2 sets - 10 reps - Supine Double Knee to Chest Modified  - 2 x daily - 7 x weekly - 1 sets - 5 reps - 10 seconds  hold - Wide Tandem Stance with Eyes Open  - 1 x daily - 7 x weekly - 1-2 sets - 6 reps - Standing Narrow Base of Support  - 1 x daily - 7 x weekly - 1-2 sets - 5 reps   ASSESSMENT:  CLINICAL IMPRESSION: Pt requires UE support for SLS and narrow base of support challenges. She reported increased tightness in lower back with forward and lateral step ups; reduced with suspended cycling. LEs fatigue quickly with step ups, max of 5 reps at this time. 30s STS remains at 9 reps.  Pt progressing gradually towards goals.     OBJECTIVE IMPAIRMENTS: decreased activity tolerance, decreased balance, decreased  endurance, decreased mobility, difficulty walking, decreased ROM, decreased strength, hypomobility, impaired perceived functional ability, impaired flexibility, postural dysfunction, and pain.  ACTIVITY LIMITATIONS: carrying, lifting, bending, standing, squatting, sleeping, stairs, transfers, and bed mobility  PARTICIPATION LIMITATIONS: school  PERSONAL FACTORS: Age, Behavior pattern, Past/current experiences, Time since onset of injury/illness/exacerbation, and 1-2 comorbidities: h/o syncopal episodes and falls, h/o R THA  are also affecting patient's functional outcome.   REHAB POTENTIAL: Good  CLINICAL DECISION MAKING: Evolving/moderate complexity  EVALUATION COMPLEXITY: Moderate   GOALS: Goals reviewed with patient? Yes  SHORT TERM GOALS: Target date: 2 weeks, 12/20/23  I HEP Baseline: TBD Goal status: ONGOING 12/24/23 assigned last visit    LONG TERM GOALS: Target date: 12 weeks, 02/27/24   Improve gait speed from 0.34 m/sec to 1 m/sec with or without LAD Baseline:  Goal status: INITIAL  2.  30 sec sit to stand 10 reps or greater Baseline: 5 Goal status: ONGOING 12/24/23  3.  Modified oswestry improve from 70% disability to 59% or less Baseline:  Goal status: INITIAL  4.  Improve R hip flexion and ext strength from 3-/5 to 4/5 for less labored bed mobility Baseline:  Goal status: INITIAL  PLAN: PT FREQUENCY: 2x/week  PT DURATION: 12 weeks  PLANNED INTERVENTIONS: 97110-Therapeutic exercises, 97530- Therapeutic activity, 97112- Neuromuscular re-education, 97535- Self Care, [id number]- Manual therapy, [phone]- Gait training, [phone]- Aquatic Therapy, [phone]- Ionotophoresis 4mg /ml Dexamethasone , Patient/Family education, Balance training, and Spinal mobilization.  PLAN FOR NEXT SESSION:   Land PT: soft tissue manual techniques R lumbar and glutes, SI, cardiovascular equipment, isolated strengthening B hips, balance   Water PT: per aquatic therapist recs   Delon Aquas, PTA 01/16/24 10:09 AM [facility] [address] [place], [place], [address] Phone: [phone]   Fax:  [phone]

## 2024-01-17 DIAGNOSIS — M5416 Radiculopathy, lumbar region: Secondary | ICD-10-CM | POA: Diagnosis not present

## 2024-01-21 ENCOUNTER — Ambulatory Visit: Admitting: Internal Medicine

## 2024-01-21 NOTE — Progress Notes (Signed)
 Remote Loop Recorder Transmission

## 2024-01-24 ENCOUNTER — Ambulatory Visit (HOSPITAL_BASED_OUTPATIENT_CLINIC_OR_DEPARTMENT_OTHER): Admitting: Physical Therapy

## 2024-01-29 ENCOUNTER — Ambulatory Visit: Attending: Cardiology | Admitting: *Deleted

## 2024-01-29 DIAGNOSIS — I4891 Unspecified atrial fibrillation: Secondary | ICD-10-CM | POA: Insufficient documentation

## 2024-01-29 DIAGNOSIS — I480 Paroxysmal atrial fibrillation: Secondary | ICD-10-CM | POA: Diagnosis not present

## 2024-01-29 DIAGNOSIS — Z5181 Encounter for therapeutic drug level monitoring: Secondary | ICD-10-CM | POA: Diagnosis not present

## 2024-01-29 LAB — POCT INR: INR: 1.9 — AB (ref 2.0–3.0)

## 2024-01-29 NOTE — Progress Notes (Signed)
 Description   INR-1.9; Today take 7.5mg  of warfarin then continue taking 5mg  daily except 10mg  every Friday. Recheck INR in 4 weeks.  Anticoagulation Clinic [phone]

## 2024-01-29 NOTE — Patient Instructions (Signed)
 Description   INR-1.9; Today take 7.5mg  of warfarin then continue taking 5mg  daily except 10mg  every Friday. Recheck INR in 4 weeks.  Anticoagulation Clinic [phone]

## 2024-02-03 ENCOUNTER — Other Ambulatory Visit: Payer: Self-pay | Admitting: Internal Medicine

## 2024-02-03 DIAGNOSIS — I4891 Unspecified atrial fibrillation: Secondary | ICD-10-CM

## 2024-02-04 NOTE — Telephone Encounter (Signed)
 Refill request for warfarin:  Last INR was 1.9 on 01/29/24 Next INR due 02/26/24 LOV was 12/21/23  Refill approved.

## 2024-02-06 ENCOUNTER — Encounter (HOSPITAL_BASED_OUTPATIENT_CLINIC_OR_DEPARTMENT_OTHER): Payer: Self-pay | Admitting: Physical Therapy

## 2024-02-06 ENCOUNTER — Ambulatory Visit (HOSPITAL_BASED_OUTPATIENT_CLINIC_OR_DEPARTMENT_OTHER): Payer: Self-pay | Admitting: Physical Therapy

## 2024-02-06 ENCOUNTER — Encounter

## 2024-02-06 DIAGNOSIS — M5459 Other low back pain: Secondary | ICD-10-CM | POA: Diagnosis not present

## 2024-02-06 DIAGNOSIS — M6281 Muscle weakness (generalized): Secondary | ICD-10-CM

## 2024-02-06 DIAGNOSIS — M25551 Pain in right hip: Secondary | ICD-10-CM

## 2024-02-06 NOTE — Therapy (Addendum)
 OUTPATIENT PHYSICAL THERAPY THORACOLUMBAR TREATMENT   Patient Name: Danielle Garrett MRN: [id number] DOB:1950/08/05, 73 y.o., female Today's Date: 02/06/2024  END OF SESSION:  PT End of Session - 02/06/24 0806     Visit Number 8    Date for Recertification  02/28/24    Authorization Type Medicare    Progress Note Due on Visit 10    PT Start Time 0800    PT Stop Time 0840    PT Time Calculation (min) 40 min    Behavior During Therapy [facility] for tasks assessed/performed               Past Medical History:  Diagnosis Date   Allergic rhinitis due to other allergen    Anginal pain 2011   hospitalized for CP in the past; due to low potassium   Anxiety    Arthritis    Childhood asthma    Chronic hoarseness    Gastroparesis    GERD (gastroesophageal reflux disease)    History of hiatal hernia    Hx of blood transfusion reaction    in the early 80s   Hypertension    Left bundle branch block     (abnormal heart rhythm)   PONV (postoperative nausea and vomiting)    Pyloric stenosis    Past Surgical History:  Procedure Laterality Date   ABDOMINAL HYSTERECTOMY  1983   CARDIAC CATHETERIZATION  10/2009   Cardiac cath normal coronary arteries   CERVICAL DISC ARTHROPLASTY     disc replacement in neck 1998   COLONOSCOPY     10 year repeat 09/26/2010   ESOPHAGOGASTRODUODENOSCOPY  09/26/2010,01/08/13   LOOP RECORDER INSERTION N/A 05/29/2016   Procedure: Loop Recorder Insertion;  Surgeon: Danelle LELON Birmingham, MD;  Location: [facility] INVASIVE CV LAB;  Service: Cardiovascular;  Laterality: N/A;   OTHER SURGICAL HISTORY  1983   hysterectomy ovaries intact, prolapsed    OTHER SURGICAL HISTORY  2006   pyloric stricture surgery   TOTAL HIP ARTHROPLASTY Right 06/07/2015   Procedure: TOTAL HIP ARTHROPLASTY ANTERIOR APPROACH;  Surgeon: Dempsey Sensor, MD;  Location: [facility] OR;  Service: Orthopedics;  Laterality: Right;   Patient Active Problem List   Diagnosis Date Noted   Hypertensive urgency  09/25/2022   Hypertension 06/15/2022   Encounter for therapeutic drug monitoring 05/08/2022   Autonomic dysfunction 09/28/2021   PAF (paroxysmal atrial fibrillation) (HCC) 05/29/2016   Syncope, cardiogenic 05/24/2016   Primary osteoarthritis of right hip 06/05/2015   Pre-operative clearance 06/02/2015   Chest pain 06/02/2015   LBBB (left bundle branch block) 06/02/2015   History of left hip replacement 06/02/2015   Palpitation 10/07/2014   Constipation 08/23/2013   Nausea 08/23/2013    PCP: Kip Righter, MD  REFERRING PROVIDER: Joshua Alm Hamilton, MD, neurology clinic  REFERRING DIAG: lumbar spine pain and radiculopathy  Rationale for Evaluation and Treatment: Rehabilitation  THERAPY DIAG:  Other low back pain  Pain in right hip  Muscle weakness (generalized)  ONSET DATE: chronic , greater than 2 years  SUBJECTIVE:  SUBJECTIVE STATEMENT: Pt reports she is going to [facility] 3x/wk.  Injection helped a little bit. Pt reports improved balance since attending [employer] classes.    POOL ACCESS: joined [employer].   PERTINENT HISTORY:  Extensive history with syncopal episodes , once with resultant L tib fib fx.  H/o R ant THA 2 years ago, still reports residual hip pain  PAIN:  Are you having pain? no: NPRS scale: 0/10 Pain location: B low back  Pain description: just stiffness, like a vice Aggravating factors: inactivity  Relieving factors: moist heat   PRECAUTIONS: Other: syncopal episodes  RED FLAGS: None   WEIGHT BEARING RESTRICTIONS: No  FALLS:  Has patient fallen in last 6 months? Yes. Number of falls 1 due to syncope  LIVING ENVIRONMENT: Lives with: lives with their spouse Lives in: House/apartment Stairs: No Has following equipment at home: Single point cane and Environmental Consultant - 2  wheeled  OCCUPATION: retired  PLOF: Independent  PATIENT GOALS: get better movement for better quality of life  NEXT MD VISIT: unclear when returns to referring MD  OBJECTIVE:  Note: Objective measures were completed at Evaluation unless otherwise noted.  DIAGNOSTIC FINDINGS:  Degenerative disc disease L4/5 and facet arthropathy L 3-4, L4-5 and vestigal disc L 5  PATIENT SURVEYS:  Modified Oswestry Low Back Pain Disability Questionnaire: 35 / 50 = 70.0 %  COGNITION: Overall cognitive status: Within functional limits for tasks assessed     SENSATION: WFL  MUSCLE LENGTH: Hamstrings: Right wfl deg; Left wfl deg Debby test: Right wfl deg; Left wfl deg  POSTURE: standing , L iliac crest 1/2  to 1 lower than R,mild R pelvic shift ,mid thoracic kyphosis noted  PALPATION: Exquisitely tender R post SI jt line, also R gluteus minimus, medius PA glides lumbar non provoking, patient reports some pain relief with pressure  Poor PA mobility lower thoracic spine noted LUMBAR ROM:   AROM eval  Flexion wfl  Extension 10  Right lateral flexion Nt due to balance  Left lateral flexion nt  Right rotation nt  Left rotation nt   (Blank rows = not tested)  LOWER EXTREMITY ROM:   hip ROM B wnl   LOWER EXTREMITY MMT: noted instability, very poor control and balance with toe walking, 3+/5 strength B plantarflexors Bridging B 1/2 available ROM, 3-/5 B hip extensors R hip flexion p!  , 3-/5  Coordination testing B ankles wnl  LUMBAR SPECIAL TESTS:  Straight leg raise test: Negative and Thomas test: Negative  FUNCTIONAL TESTS:    30 sec sit to stand, 5 reps, labored; Gait speed, 3 m in 8.73 sec or 0.34 m/sec   Bed mobility, labored, slow for rolling side to side and for supine to sit and sit to supine     12/24/23 30sec STS: 9 reps labored no UEs    01/16/24: 30s STS: 9 reps, no UE, labored for last 3 reps  GAIT: No device, trendelenberg R, maintains flexed posture through trunk  approximately 20 degrees, guarded, slow gait speed, decreased stance time on R, limited R hip extension in terminal stance/pre swing on R.  Noted that pt  inadvertently turned head to L while weight bearing on R and had sharp pain R SI causing her to stop for a few mins  TREATMENT DATE:  [facility] Adult PT Treatment:  Date: 02/06/24 Pt seen for aquatic therapy today.  Treatment took place in water 3.5-4.75 ft in depth at the [facility] pool. Temp of water was 91.  Pt entered/exited the pool via stairs independently with bil rail.  * pt walked forward/ backward and sideways for warm up prior to session - grapevine x 2 laps - high knee marching forward/ backward - UE on yellow hand floats:  tandem gait forward/ backward (challenge!) - semi tandem stance with horiz abdct/ add with yellow hand floats (challenge, increased back pain) - knee to chest stretch holding wall x 30sec - UE on yellow hand floats:  3 way LE kick x 10 each LE - forward step ups, with UE on rails x 10 each LE LLE shows fatigue after 5 steps) - Lt lateral step ups, UE on rails x 10 each LE (first 2 difficult, but improves) - return to walking forward for rest/recovery - decompression with yellow noodle under arms behind back: cycling   [facility] Adult PT Treatment:                                             Date: 01/16/24 Pt seen for aquatic therapy today.  Treatment took place in water 3.5-4.75 ft in depth at the [facility] pool. Temp of water was 91.  Pt entered/exited the pool via stairs independently with bil rail.  -30s STS test - standing quad stretch with foot on 2nd step, 20s x 2 each LE - UE on yellow hand floats walking forward/ backward -sideways with arm horz add/abdct with yellow hand floats - forward marching with hands resting on top of water  - forward step ups, with UE on rails x 5 each LE (fatigues quickly) - lateral step ups, UE on rails x 5 each LE  (fatigues quickly) - decompression with yellow noodle under arms behind back, UE on corner: cycling, hip abdct/add, hip flexion/extension - staggered stance balance UE on top of water (challenge, but improved from semi-tandem stance)   [facility] Adult PT Treatment:                                             Date: 01/14/24 Pt seen for aquatic therapy today.  Treatment took place in water 3.5-4.75 ft in depth at the [facility] pool. Temp of water was 91.  Pt entered/exited the pool via stairs independently with bil rail.  - UE on barbell walking forward/ backward / sideways -> without UE support - unable to complete tandem gait or grapevine due to unsteadiness -> grapevine at wall with UE support - SLS with light UE support on wall x 15sec (challenge!) -> semi tandem stance with light UE on wall x 15sec x 2 (challenge) - UE on wall:   hip add/abdct x 10; knee to chest stretch x 20s; toe/heel raises x 15; - return to walking forward/ backward with UE on yellow hand floats with cues for even step length (unsteady with increased Rt retro step length) - alternating toe taps to first step with light UE support on rails  [facility] Adult PT Treatment:  Date: 12/31/23 Pt seen for aquatic therapy today.  Treatment took place in water 3.5-4.75 ft in depth at the [facility] pool. Temp of water was 91.  Pt entered/exited the pool via stairs independently with bil rail.  - Intro to aquatic therapy principles - UE on barbell walking forward/ backward / sideways - side stepping with horz abdct/add with yellow hand floats on top of water  - L stretch with hands on rails x 20 sec x 2 with gentle tail wag - UE on wall:  toe/heel raises x 15; hip add/abdct x 10; relaxed squat x 5; alternating hip ext x 5 each; knee to chest stretch x 20s  Pt requires the buoyancy and hydrostatic pressure of water for support, and to offload joints by unweighting joint load  by at least 50 % in navel deep water and by at least 75-80% in chest to neck deep water.  Viscosity of the water is needed for resistance of strengthening. Water current perturbations provides challenge to standing balance requiring increased core activation.     12/26/23  Scifit bike L4x8 minutes (Nustep not available) for w/u   QL stretch forward with stool 3x30 seconds QL stretch forward  with stool + R/L bias 1x30 seconds B  Frog stretch supine 3x30 seconds B DKTC 5x10 seconds with towel behind knees Figure 4 stretch 2x30 seconds B  Wide tandem solid surface 3x30 seconds B min guard  Narrow BOS 4x30 seconds one time MinA due to posterior LOB  Side steps at TM rail min guard x4 laps down and back min guard    12/24/23  Nustep L4x8 minutes seat 4, all four extremities  30 second STS test, goals  Lumbar rotations stretches 3x10 seconds B (limited due to R groin pain) SKTC 5x5 seconds B HS stretches- attempted but caused radicular sx in low back  PPT 15x3 seconds  PPT + march x12  Bridge + ABD into green TB x10 Sidelying clams green TB x10 B Hip hikes x12 B Hip hikes + ABD x10 B Forward QL stretch with stool 1x30 seconds                                                                                                                            PATIENT EDUCATION:  Education details: intro to aquatic therapy  Person educated: Patient Education method: Explanation, Demonstration, Tactile cues, Verbal cues Education comprehension: verbalized understanding, returned demonstration, verbal cues required, tactile cues required, and needs further education  HOME EXERCISE PROGRAM:  Access Code: [email or web address] URL: [email or web address] Date: 12/26/2023 Prepared by: Josette Rough  Exercises - Seated Piriformis Stretch with Trunk Bend  - 2 x daily - 7 x weekly - 2 reps - 30 hold - Seated Hamstring Stretch  - 2 x daily - 7 x weekly - 2 reps - 30 hold - Supine Lower  Trunk Rotation  - 2 x daily - 7 x weekly - 1 sets - 10 reps - Supine  Piriformis Stretch with Foot on Ground  - 2 x daily - 7 x weekly - 2 reps - 30 hold - Supine Bridge  - 1 x daily - 7 x weekly - 2 sets - 10 reps - Supine Double Knee to Chest Modified  - 2 x daily - 7 x weekly - 1 sets - 5 reps - 10 seconds  hold - Wide Tandem Stance with Eyes Open  - 1 x daily - 7 x weekly - 1-2 sets - 6 reps - Standing Narrow Base of Support  - 1 x daily - 7 x weekly - 1-2 sets - 5 reps   ASSESSMENT:  CLINICAL IMPRESSION: Pt requires UE support for SLS and narrow base of support challenges. She reported increased tightness in lower back  with narrow base of support exercise; improves with knee to chest stretch at wall. Improved balance observed with tandem gait.  LLE fatigues quickly with step ups. Plan to assess goals next visit as pt reports desire to d/c after that session.  Pt progressing gradually towards goals.     OBJECTIVE IMPAIRMENTS: decreased activity tolerance, decreased balance, decreased endurance, decreased mobility, difficulty walking, decreased ROM, decreased strength, hypomobility, impaired perceived functional ability, impaired flexibility, postural dysfunction, and pain.   ACTIVITY LIMITATIONS: carrying, lifting, bending, standing, squatting, sleeping, stairs, transfers, and bed mobility  PARTICIPATION LIMITATIONS: school  PERSONAL FACTORS: Age, Behavior pattern, Past/current experiences, Time since onset of injury/illness/exacerbation, and 1-2 comorbidities: h/o syncopal episodes and falls, h/o R THA  are also affecting patient's functional outcome.   REHAB POTENTIAL: Good  CLINICAL DECISION MAKING: Evolving/moderate complexity  EVALUATION COMPLEXITY: Moderate   GOALS: Goals reviewed with patient? Yes  SHORT TERM GOALS: Target date: 2 weeks, 12/20/23  I HEP Baseline: TBD Goal status: ONGOING 12/24/23 assigned last visit    LONG TERM GOALS: Target date: 12 weeks, 02/27/24    Improve gait speed from 0.34 m/sec to 1 m/sec with or without LAD Baseline:  Goal status: INITIAL  2.  30 sec sit to stand 10 reps or greater Baseline: 5 Goal status: ONGOING 12/24/23  3.  Modified oswestry improve from 70% disability to 59% or less Baseline:  Goal status: INITIAL  4.  Improve R hip flexion and ext strength from 3-/5 to 4/5 for less labored bed mobility Baseline:  Goal status: INITIAL  PLAN: PT FREQUENCY: 2x/week  PT DURATION: 12 weeks  PLANNED INTERVENTIONS: 97110-Therapeutic exercises, 97530- Therapeutic activity, 97112- Neuromuscular re-education, 97535- Self Care, 02859- Manual therapy, [phone]- Gait training, [phone]- Aquatic Therapy, [phone]- Ionotophoresis 4mg /ml Dexamethasone , Patient/Family education, Balance training, and Spinal mobilization.  PLAN FOR NEXT SESSION:   Land PT: soft tissue manual techniques R lumbar and glutes, SI, cardiovascular equipment, isolated strengthening B hips, balance   Water PT: per aquatic therapist recs   Delon Aquas, PTA 02/06/24 8:42 AM [facility] [address] [place], [place], [address] Phone: [phone]   Fax:  [phone]

## 2024-02-07 ENCOUNTER — Ambulatory Visit: Payer: Self-pay | Admitting: Internal Medicine

## 2024-02-07 ENCOUNTER — Encounter

## 2024-02-07 ENCOUNTER — Ambulatory Visit

## 2024-02-07 DIAGNOSIS — Z23 Encounter for immunization: Secondary | ICD-10-CM | POA: Diagnosis not present

## 2024-02-07 DIAGNOSIS — Z0000 Encounter for general adult medical examination without abnormal findings: Secondary | ICD-10-CM | POA: Diagnosis not present

## 2024-02-07 DIAGNOSIS — I10 Essential (primary) hypertension: Secondary | ICD-10-CM | POA: Diagnosis not present

## 2024-02-07 DIAGNOSIS — G909 Disorder of the autonomic nervous system, unspecified: Secondary | ICD-10-CM | POA: Diagnosis not present

## 2024-02-07 DIAGNOSIS — M810 Age-related osteoporosis without current pathological fracture: Secondary | ICD-10-CM | POA: Diagnosis not present

## 2024-02-07 DIAGNOSIS — I4891 Unspecified atrial fibrillation: Secondary | ICD-10-CM | POA: Diagnosis not present

## 2024-02-07 DIAGNOSIS — E785 Hyperlipidemia, unspecified: Secondary | ICD-10-CM | POA: Diagnosis not present

## 2024-02-07 DIAGNOSIS — F418 Other specified anxiety disorders: Secondary | ICD-10-CM | POA: Diagnosis not present

## 2024-02-07 DIAGNOSIS — E538 Deficiency of other specified B group vitamins: Secondary | ICD-10-CM | POA: Diagnosis not present

## 2024-02-07 DIAGNOSIS — R7309 Other abnormal glucose: Secondary | ICD-10-CM | POA: Diagnosis not present

## 2024-02-07 LAB — CUP PACEART REMOTE DEVICE CHECK
Date Time Interrogation Session: 20251022232657
Implantable Pulse Generator Implant Date: 20240813

## 2024-02-08 NOTE — Progress Notes (Signed)
 Remote Loop Recorder Transmission

## 2024-02-11 DIAGNOSIS — I480 Paroxysmal atrial fibrillation: Secondary | ICD-10-CM | POA: Diagnosis not present

## 2024-02-11 DIAGNOSIS — I4891 Unspecified atrial fibrillation: Secondary | ICD-10-CM | POA: Diagnosis not present

## 2024-02-11 DIAGNOSIS — I10 Essential (primary) hypertension: Secondary | ICD-10-CM | POA: Diagnosis not present

## 2024-02-12 ENCOUNTER — Ambulatory Visit (HOSPITAL_BASED_OUTPATIENT_CLINIC_OR_DEPARTMENT_OTHER): Payer: Self-pay | Admitting: Physical Therapy

## 2024-02-15 DIAGNOSIS — I4891 Unspecified atrial fibrillation: Secondary | ICD-10-CM | POA: Diagnosis not present

## 2024-02-15 DIAGNOSIS — E785 Hyperlipidemia, unspecified: Secondary | ICD-10-CM | POA: Diagnosis not present

## 2024-02-15 DIAGNOSIS — I10 Essential (primary) hypertension: Secondary | ICD-10-CM | POA: Diagnosis not present

## 2024-02-15 DIAGNOSIS — I480 Paroxysmal atrial fibrillation: Secondary | ICD-10-CM | POA: Diagnosis not present

## 2024-02-26 ENCOUNTER — Ambulatory Visit: Attending: Internal Medicine | Admitting: Pharmacist

## 2024-02-26 DIAGNOSIS — I4891 Unspecified atrial fibrillation: Secondary | ICD-10-CM | POA: Insufficient documentation

## 2024-02-26 DIAGNOSIS — I480 Paroxysmal atrial fibrillation: Secondary | ICD-10-CM | POA: Diagnosis not present

## 2024-02-26 DIAGNOSIS — Z5181 Encounter for therapeutic drug level monitoring: Secondary | ICD-10-CM | POA: Diagnosis not present

## 2024-02-26 LAB — POCT INR: INR: 1.9 — AB (ref 2.0–3.0)

## 2024-02-26 NOTE — Patient Instructions (Signed)
 Description   INR-1.9; Today take 7.5mg  of warfarin then continue taking 5mg  daily except 10mg  every Friday. Recheck INR in 4 weeks.  Anticoagulation Clinic [phone]

## 2024-02-26 NOTE — Progress Notes (Signed)
 Description   INR-1.9; Today take 7.5mg  of warfarin then continue taking 5mg  daily except 10mg  every Friday. Recheck INR in 4 weeks.  Anticoagulation Clinic [phone]

## 2024-03-08 ENCOUNTER — Encounter

## 2024-03-09 ENCOUNTER — Ambulatory Visit

## 2024-03-10 ENCOUNTER — Encounter

## 2024-03-12 ENCOUNTER — Ambulatory Visit

## 2024-03-12 DIAGNOSIS — I480 Paroxysmal atrial fibrillation: Secondary | ICD-10-CM | POA: Diagnosis not present

## 2024-03-12 DIAGNOSIS — I10 Essential (primary) hypertension: Secondary | ICD-10-CM | POA: Diagnosis not present

## 2024-03-12 DIAGNOSIS — I4891 Unspecified atrial fibrillation: Secondary | ICD-10-CM | POA: Diagnosis not present

## 2024-03-12 LAB — CUP PACEART REMOTE DEVICE CHECK
Date Time Interrogation Session: 20251125233401
Implantable Pulse Generator Implant Date: 20240813

## 2024-03-14 NOTE — Progress Notes (Signed)
 Remote Loop Recorder Transmission

## 2024-03-16 ENCOUNTER — Other Ambulatory Visit: Payer: Self-pay | Admitting: Cardiovascular Disease

## 2024-03-16 DIAGNOSIS — I480 Paroxysmal atrial fibrillation: Secondary | ICD-10-CM | POA: Diagnosis not present

## 2024-03-16 DIAGNOSIS — I10 Essential (primary) hypertension: Secondary | ICD-10-CM | POA: Diagnosis not present

## 2024-03-16 DIAGNOSIS — E785 Hyperlipidemia, unspecified: Secondary | ICD-10-CM | POA: Diagnosis not present

## 2024-03-16 DIAGNOSIS — I4891 Unspecified atrial fibrillation: Secondary | ICD-10-CM | POA: Diagnosis not present

## 2024-03-20 ENCOUNTER — Ambulatory Visit: Payer: Self-pay | Admitting: Internal Medicine

## 2024-03-28 ENCOUNTER — Telehealth: Payer: Self-pay

## 2024-03-28 ENCOUNTER — Ambulatory Visit

## 2024-03-28 NOTE — Telephone Encounter (Signed)
 Returned patients phone call.   Patient reports she was at church with her husband, husband had syncopal event. States she had to call EMS and was a very stressful moment for her and him. Compliant with Toprol -XL 12.5 mg daily at bedtime.   Informed patient I do not anticipate any changes from Dr. Waddell, however if he does advise any someone will follow up with her. Pt. Voiced understanding and was appreciative of call.

## 2024-03-28 NOTE — Telephone Encounter (Signed)
Pt called back returning call.

## 2024-03-28 NOTE — Telephone Encounter (Signed)
 Alert received from [employer] for tachy event occurred 12/11 @ 16:08, duration 8sec, some TWOS with underlying tachy reaching tachy detection of 158 bpm.  Route to triage for first tachy.  Attempted to contact patient. No answer, left message to call back.

## 2024-03-30 NOTE — Telephone Encounter (Signed)
 If husband is having syncope, he should be seen in followup.

## 2024-03-31 NOTE — Telephone Encounter (Signed)
 Patients husband had syncopal event, not patient.

## 2024-04-01 NOTE — Telephone Encounter (Signed)
 Danielle Garrett, real question is the timing of his syncope. Did it occur when he had the tachycardia. If so, he needs to be seen sooner than later. If the syncope did not occur with the tachycardia then no rush. Can you call and find out for me. GT

## 2024-04-01 NOTE — Telephone Encounter (Signed)
 Patient did NOT have syncopal event.   Patient states her HUSBAND had syncopal event.   Patient was ASYMPTOMATIC during tachycardic event but was under a great deal of stress.    Sorry for the confusion.

## 2024-04-07 ENCOUNTER — Ambulatory Visit: Attending: Internal Medicine | Admitting: Pharmacist

## 2024-04-07 DIAGNOSIS — Z5181 Encounter for therapeutic drug level monitoring: Secondary | ICD-10-CM | POA: Diagnosis present

## 2024-04-07 DIAGNOSIS — I4891 Unspecified atrial fibrillation: Secondary | ICD-10-CM | POA: Insufficient documentation

## 2024-04-07 DIAGNOSIS — I480 Paroxysmal atrial fibrillation: Secondary | ICD-10-CM | POA: Insufficient documentation

## 2024-04-07 LAB — POCT INR: INR: 1.5 — AB (ref 2.0–3.0)

## 2024-04-07 NOTE — Patient Instructions (Signed)
 Description   INR-1.5; Take 1.5 tablets today and tomorrow and then continue taking 5mg  daily except 10mg  every Friday. Recheck INR in 3 weeks after procedure Anticoagulation Clinic [phone]

## 2024-04-07 NOTE — Progress Notes (Signed)
 Description   INR-1.5; Take 1.5 tablets today and tomorrow and then continue taking 5mg  daily except 10mg  every Friday. Recheck INR in 3 weeks after procedure Anticoagulation Clinic [phone]

## 2024-04-08 ENCOUNTER — Encounter

## 2024-04-09 ENCOUNTER — Ambulatory Visit

## 2024-04-10 ENCOUNTER — Encounter

## 2024-04-12 ENCOUNTER — Ambulatory Visit

## 2024-04-12 DIAGNOSIS — I4891 Unspecified atrial fibrillation: Secondary | ICD-10-CM

## 2024-04-14 LAB — CUP PACEART REMOTE DEVICE CHECK
Date Time Interrogation Session: 20251226232111
Implantable Pulse Generator Implant Date: 20240813

## 2024-04-16 ENCOUNTER — Other Ambulatory Visit: Payer: Self-pay | Admitting: Cardiovascular Disease

## 2024-04-16 DIAGNOSIS — I10 Essential (primary) hypertension: Secondary | ICD-10-CM

## 2024-04-16 NOTE — Progress Notes (Signed)
 Remote Loop Recorder Transmission

## 2024-04-20 ENCOUNTER — Ambulatory Visit: Payer: Self-pay | Admitting: Cardiology

## 2024-04-30 ENCOUNTER — Ambulatory Visit: Attending: Internal Medicine | Admitting: *Deleted

## 2024-04-30 DIAGNOSIS — Z5181 Encounter for therapeutic drug level monitoring: Secondary | ICD-10-CM | POA: Diagnosis present

## 2024-04-30 DIAGNOSIS — I480 Paroxysmal atrial fibrillation: Secondary | ICD-10-CM | POA: Diagnosis present

## 2024-04-30 DIAGNOSIS — I4891 Unspecified atrial fibrillation: Secondary | ICD-10-CM | POA: Diagnosis present

## 2024-04-30 LAB — POCT INR: INR: 2.1 (ref 2.0–3.0)

## 2024-04-30 NOTE — Patient Instructions (Signed)
 Description   INR-2.1; Continue taking 5mg  daily except 10mg  every Friday. Recheck INR in 1 week after procedure. Anticoagulation Clinic [phone]

## 2024-04-30 NOTE — Progress Notes (Signed)
 Description   INR-2.1; Continue taking 5mg  daily except 10mg  every Friday. Recheck INR in 1 week after procedure. Anticoagulation Clinic [phone]

## 2024-05-09 ENCOUNTER — Encounter

## 2024-05-10 ENCOUNTER — Ambulatory Visit

## 2024-05-10 ENCOUNTER — Other Ambulatory Visit: Payer: Self-pay | Admitting: Cardiovascular Disease

## 2024-05-10 DIAGNOSIS — I10 Essential (primary) hypertension: Secondary | ICD-10-CM

## 2024-05-12 ENCOUNTER — Encounter

## 2024-05-13 ENCOUNTER — Ambulatory Visit

## 2024-05-13 DIAGNOSIS — I4891 Unspecified atrial fibrillation: Secondary | ICD-10-CM | POA: Diagnosis not present

## 2024-05-13 LAB — CUP PACEART REMOTE DEVICE CHECK
Date Time Interrogation Session: 20260126232842
Implantable Pulse Generator Implant Date: 20240813

## 2024-05-14 ENCOUNTER — Telehealth (HOSPITAL_BASED_OUTPATIENT_CLINIC_OR_DEPARTMENT_OTHER): Payer: Self-pay

## 2024-05-14 NOTE — Telephone Encounter (Signed)
"  ° °  Pre-operative Risk Assessment    Patient Name: Danielle Garrett  DOB: 03-03-1951 MRN: [id number]   Date of last office visit: 12/21/2023 with Ozell Prentice Passey, PA-C Date of next office visit: None  Request for Surgical Clearance    Procedure:  ESI  Date of Surgery:  Clearance TBD                                 Surgeon:  Dr. Deatrice Manus Surgeon's Group or Practice Name:  [facility] Phone number:  [phone] Fax number:  [phone]   Type of Clearance Requested:   - Medical  - Pharmacy:  Hold Warfarin (Coumadin ) 5 days prior and pt can resume it the next day   Type of Anesthesia:  Not Indicated   Additional requests/questions:  None  Signed, Patrcia Iverson CROME   05/14/2024, 9:18 AM   "

## 2024-05-14 NOTE — Telephone Encounter (Signed)
 Pharmacy, can you please provide recommendations for holding Coumadin  for upcoming spinal injection?  Of note for pre-op APP team, she will need a virtual visit after pharmacy recommendations.   Thank you!

## 2024-05-16 ENCOUNTER — Ambulatory Visit

## 2024-05-16 ENCOUNTER — Telehealth: Payer: Self-pay | Admitting: *Deleted

## 2024-05-16 NOTE — Telephone Encounter (Signed)
 Left msg for pt to return call to preop team.

## 2024-05-16 NOTE — Telephone Encounter (Signed)
" ° °  Name: Danielle Garrett  DOB: 1950-12-19  MRN: [id number]  Primary Cardiologist: Jerel Balding, MD   Preoperative team, please contact this patient and set up a phone call appointment for further preoperative risk assessment. Please obtain consent and complete medication review. Thank you for your help.  I confirm that guidance regarding antiplatelet and oral anticoagulation therapy has been completed and, if necessary, noted below.  Per office protocol, patient can hold warfarin for 5 days prior to procedure.   Patient will not need bridging with Lovenox (enoxaparin) around procedure.  I also confirmed the patient resides in the state of [place] . As per [facility] Medical Board telemedicine laws, the patient must reside in the state in which the provider is licensed.   [name], NP 05/16/2024, 1:50 PM [facility]    "

## 2024-05-16 NOTE — Telephone Encounter (Signed)
 Pt returning call. Please advise.

## 2024-05-16 NOTE — Telephone Encounter (Signed)
 Patient with diagnosis of atrial fibrillation on warfarin for anticoagulation.    Procedure:  ESI   Date of Surgery:  Clearance TBD   CHA2DS2-VASc Score = 4   This indicates a 4.8% annual risk of stroke. The patient's score is based upon: CHF History: 0 HTN History: 1 Diabetes History: 0 Stroke History: 0 Vascular Disease History: 1 Age Score: 1 Gender Score: 1    CrCl 51 Platelet count 219  Patient has not had an Afib/aflutter ablation in the last 3 months, DCCV within the last 4 weeks or a watchman implanted in the last 45 days   Per office protocol, patient can hold warfarin for 5 days prior to procedure.   Patient will not need bridging with Lovenox (enoxaparin) around procedure.  **This guidance is not considered finalized until pre-operative APP has relayed final recommendations.**

## 2024-05-16 NOTE — Telephone Encounter (Signed)
 Spoke with pt and scheduled her for a preop telehealth appt. Will route back to requesting surgeons office to make them aware.

## 2024-05-18 ENCOUNTER — Ambulatory Visit: Payer: Self-pay | Admitting: Cardiology

## 2024-05-21 NOTE — Progress Notes (Signed)
 Remote Loop Recorder Transmission

## 2024-05-22 ENCOUNTER — Telehealth: Payer: Self-pay

## 2024-05-22 NOTE — Telephone Encounter (Signed)
 Patient called and advised Danielle Garrett's, PA recommendations. Pt was appreciative of call.

## 2024-05-22 NOTE — Telephone Encounter (Signed)
 Alert received from [employer] for 1 symptom activation 2/4 @ 11:16, EGM c/w SR with PVC's.  Per last OV note PVCs 12/21/2023: Burden has increased for 1 month since pt stopped coreg . Start toprol  12.5 mg at bedtime. Titrate as needed.   Routing to [place], [place] to advise further.

## 2024-05-26 ENCOUNTER — Ambulatory Visit

## 2024-05-27 ENCOUNTER — Ambulatory Visit

## 2024-06-10 ENCOUNTER — Ambulatory Visit

## 2024-06-13 ENCOUNTER — Ambulatory Visit

## 2024-07-11 ENCOUNTER — Ambulatory Visit

## 2024-07-14 ENCOUNTER — Ambulatory Visit

## 2024-08-11 ENCOUNTER — Ambulatory Visit

## 2024-08-14 ENCOUNTER — Ambulatory Visit

## 2024-09-11 ENCOUNTER — Ambulatory Visit
